# Patient Record
Sex: Male | Born: 1935 | Race: White | Hispanic: No | Smoking: Former smoker
Health system: Southern US, Community
[De-identification: ages and names within clinical notes are randomized; demographics above are authoritative.]

## PROBLEM LIST (undated history)

## (undated) DIAGNOSIS — R0600 Dyspnea, unspecified: Secondary | ICD-10-CM

## (undated) DIAGNOSIS — M549 Dorsalgia, unspecified: Secondary | ICD-10-CM

## (undated) DIAGNOSIS — Z87438 Personal history of other diseases of male genital organs: Secondary | ICD-10-CM

## (undated) DIAGNOSIS — E039 Hypothyroidism, unspecified: Secondary | ICD-10-CM

## (undated) DIAGNOSIS — I4510 Unspecified right bundle-branch block: Secondary | ICD-10-CM

## (undated) DIAGNOSIS — I82409 Acute embolism and thrombosis of unspecified deep veins of unspecified lower extremity: Secondary | ICD-10-CM

## (undated) DIAGNOSIS — D649 Anemia, unspecified: Secondary | ICD-10-CM

## (undated) DIAGNOSIS — M1990 Unspecified osteoarthritis, unspecified site: Secondary | ICD-10-CM

## (undated) DIAGNOSIS — I200 Unstable angina: Secondary | ICD-10-CM

## (undated) DIAGNOSIS — G8929 Other chronic pain: Secondary | ICD-10-CM

## (undated) DIAGNOSIS — E079 Disorder of thyroid, unspecified: Secondary | ICD-10-CM

## (undated) DIAGNOSIS — K219 Gastro-esophageal reflux disease without esophagitis: Secondary | ICD-10-CM

## (undated) DIAGNOSIS — M961 Postlaminectomy syndrome, not elsewhere classified: Secondary | ICD-10-CM

## (undated) DIAGNOSIS — I219 Acute myocardial infarction, unspecified: Secondary | ICD-10-CM

## (undated) DIAGNOSIS — I2699 Other pulmonary embolism without acute cor pulmonale: Secondary | ICD-10-CM

## (undated) DIAGNOSIS — D696 Thrombocytopenia, unspecified: Secondary | ICD-10-CM

## (undated) DIAGNOSIS — I2510 Atherosclerotic heart disease of native coronary artery without angina pectoris: Secondary | ICD-10-CM

## (undated) DIAGNOSIS — E785 Hyperlipidemia, unspecified: Secondary | ICD-10-CM

## (undated) DIAGNOSIS — H269 Unspecified cataract: Secondary | ICD-10-CM

## (undated) DIAGNOSIS — I499 Cardiac arrhythmia, unspecified: Secondary | ICD-10-CM

## (undated) DIAGNOSIS — F419 Anxiety disorder, unspecified: Secondary | ICD-10-CM

## (undated) DIAGNOSIS — I701 Atherosclerosis of renal artery: Secondary | ICD-10-CM

## (undated) DIAGNOSIS — I10 Essential (primary) hypertension: Secondary | ICD-10-CM

## (undated) HISTORY — DX: Atherosclerotic heart disease of native coronary artery without angina pectoris: I25.10

## (undated) HISTORY — PX: TOTAL HIP ARTHROPLASTY: SHX124

## (undated) HISTORY — DX: Cardiac arrhythmia, unspecified: I49.9

## (undated) HISTORY — DX: Personal history of other diseases of male genital organs: Z87.438

## (undated) HISTORY — DX: Unstable angina: I20.0

## (undated) HISTORY — DX: Hyperlipidemia, unspecified: E78.5

## (undated) HISTORY — DX: Other pulmonary embolism without acute cor pulmonale: I26.99

## (undated) HISTORY — DX: Unspecified right bundle-branch block: I45.10

## (undated) HISTORY — DX: Unspecified osteoarthritis, unspecified site: M19.90

## (undated) HISTORY — DX: Acute embolism and thrombosis of unspecified deep veins of unspecified lower extremity: I82.409

## (undated) HISTORY — PX: CATARACT EXTRACTION: SUR2

## (undated) HISTORY — DX: Thrombocytopenia, unspecified: D69.6

## (undated) HISTORY — DX: Atherosclerosis of renal artery: I70.1

## (undated) HISTORY — DX: Anemia, unspecified: D64.9

## (undated) HISTORY — PX: FOOT SURGERY: SHX648

## (undated) HISTORY — DX: Disorder of thyroid, unspecified: E07.9

## (undated) HISTORY — DX: Essential (primary) hypertension: I10

## (undated) HISTORY — PX: MANDIBLE SURGERY: SHX707

---

## 2001-07-07 ENCOUNTER — Ambulatory Visit (HOSPITAL_BASED_OUTPATIENT_CLINIC_OR_DEPARTMENT_OTHER): Admission: RE | Admit: 2001-07-07 | Discharge: 2001-07-08 | Payer: Self-pay | Admitting: Orthopedic Surgery

## 2002-02-15 ENCOUNTER — Inpatient Hospital Stay (HOSPITAL_COMMUNITY): Admission: RE | Admit: 2002-02-15 | Discharge: 2002-02-19 | Payer: Self-pay | Admitting: Orthopedic Surgery

## 2002-02-15 ENCOUNTER — Encounter: Payer: Self-pay | Admitting: Orthopedic Surgery

## 2002-02-18 ENCOUNTER — Encounter: Payer: Self-pay | Admitting: Orthopedic Surgery

## 2002-09-29 ENCOUNTER — Ambulatory Visit (HOSPITAL_COMMUNITY): Admission: RE | Admit: 2002-09-29 | Discharge: 2002-09-29 | Payer: Self-pay | Admitting: Orthopedic Surgery

## 2002-09-29 ENCOUNTER — Encounter: Payer: Self-pay | Admitting: Orthopedic Surgery

## 2003-02-28 ENCOUNTER — Ambulatory Visit (HOSPITAL_COMMUNITY): Admission: RE | Admit: 2003-02-28 | Discharge: 2003-02-28 | Payer: Self-pay | Admitting: Orthopedic Surgery

## 2003-02-28 ENCOUNTER — Ambulatory Visit (HOSPITAL_BASED_OUTPATIENT_CLINIC_OR_DEPARTMENT_OTHER): Admission: RE | Admit: 2003-02-28 | Discharge: 2003-02-28 | Payer: Self-pay | Admitting: Orthopedic Surgery

## 2003-11-06 ENCOUNTER — Encounter: Admission: RE | Admit: 2003-11-06 | Discharge: 2003-11-06 | Payer: Self-pay | Admitting: Orthopedic Surgery

## 2003-11-07 ENCOUNTER — Ambulatory Visit (HOSPITAL_BASED_OUTPATIENT_CLINIC_OR_DEPARTMENT_OTHER): Admission: RE | Admit: 2003-11-07 | Discharge: 2003-11-07 | Payer: Self-pay | Admitting: Orthopedic Surgery

## 2004-04-13 HISTORY — PX: REPLACEMENT TOTAL KNEE: SUR1224

## 2004-09-23 ENCOUNTER — Ambulatory Visit: Admission: RE | Admit: 2004-09-23 | Discharge: 2004-09-23 | Payer: Self-pay | Admitting: Orthopedic Surgery

## 2005-03-16 ENCOUNTER — Encounter: Admission: RE | Admit: 2005-03-16 | Discharge: 2005-03-16 | Payer: Self-pay | Admitting: Orthopedic Surgery

## 2006-01-05 ENCOUNTER — Encounter: Admission: RE | Admit: 2006-01-05 | Discharge: 2006-01-05 | Payer: Self-pay | Admitting: Sports Medicine

## 2006-01-25 ENCOUNTER — Encounter: Admission: RE | Admit: 2006-01-25 | Discharge: 2006-01-25 | Payer: Self-pay | Admitting: Orthopedic Surgery

## 2006-01-26 ENCOUNTER — Ambulatory Visit (HOSPITAL_BASED_OUTPATIENT_CLINIC_OR_DEPARTMENT_OTHER): Admission: RE | Admit: 2006-01-26 | Discharge: 2006-01-26 | Payer: Self-pay | Admitting: Orthopedic Surgery

## 2006-03-09 ENCOUNTER — Ambulatory Visit (HOSPITAL_BASED_OUTPATIENT_CLINIC_OR_DEPARTMENT_OTHER): Admission: RE | Admit: 2006-03-09 | Discharge: 2006-03-10 | Payer: Self-pay | Admitting: Orthopedic Surgery

## 2007-04-14 DIAGNOSIS — I2699 Other pulmonary embolism without acute cor pulmonale: Secondary | ICD-10-CM

## 2007-04-14 HISTORY — DX: Other pulmonary embolism without acute cor pulmonale: I26.99

## 2007-11-23 ENCOUNTER — Ambulatory Visit (HOSPITAL_COMMUNITY): Admission: RE | Admit: 2007-11-23 | Discharge: 2007-11-23 | Payer: Self-pay | Admitting: Orthopedic Surgery

## 2007-12-01 ENCOUNTER — Encounter: Payer: Self-pay | Admitting: Cardiovascular Disease

## 2007-12-01 ENCOUNTER — Ambulatory Visit: Payer: Self-pay

## 2007-12-01 ENCOUNTER — Ambulatory Visit: Payer: Self-pay | Admitting: Cardiovascular Disease

## 2007-12-18 ENCOUNTER — Inpatient Hospital Stay (HOSPITAL_COMMUNITY): Admission: EM | Admit: 2007-12-18 | Discharge: 2007-12-22 | Payer: Self-pay | Admitting: Emergency Medicine

## 2007-12-20 ENCOUNTER — Ambulatory Visit: Payer: Self-pay | Admitting: Surgery

## 2007-12-20 ENCOUNTER — Encounter (INDEPENDENT_AMBULATORY_CARE_PROVIDER_SITE_OTHER): Payer: Self-pay | Admitting: Internal Medicine

## 2008-04-13 HISTORY — PX: RENAL ARTERY STENT: SHX2321

## 2008-05-01 ENCOUNTER — Inpatient Hospital Stay (HOSPITAL_COMMUNITY): Admission: RE | Admit: 2008-05-01 | Discharge: 2008-05-04 | Payer: Self-pay | Admitting: Orthopedic Surgery

## 2009-11-11 DIAGNOSIS — I2510 Atherosclerotic heart disease of native coronary artery without angina pectoris: Secondary | ICD-10-CM

## 2009-11-11 HISTORY — DX: Atherosclerotic heart disease of native coronary artery without angina pectoris: I25.10

## 2009-11-17 ENCOUNTER — Encounter (INDEPENDENT_AMBULATORY_CARE_PROVIDER_SITE_OTHER): Payer: Self-pay | Admitting: Internal Medicine

## 2009-11-17 ENCOUNTER — Ambulatory Visit: Payer: Self-pay | Admitting: Internal Medicine

## 2009-11-17 ENCOUNTER — Inpatient Hospital Stay (HOSPITAL_COMMUNITY): Admission: EM | Admit: 2009-11-17 | Discharge: 2009-11-26 | Payer: Self-pay | Admitting: Emergency Medicine

## 2009-11-18 ENCOUNTER — Ambulatory Visit: Payer: Self-pay | Admitting: Cardiothoracic Surgery

## 2009-11-18 ENCOUNTER — Encounter: Payer: Self-pay | Admitting: Internal Medicine

## 2009-11-18 ENCOUNTER — Encounter: Payer: Self-pay | Admitting: Cardiovascular Disease

## 2009-11-21 HISTORY — PX: CORONARY ARTERY BYPASS GRAFT: SHX141

## 2009-11-26 ENCOUNTER — Encounter: Payer: Self-pay | Admitting: Cardiovascular Disease

## 2009-11-29 ENCOUNTER — Encounter: Payer: Self-pay | Admitting: Cardiovascular Disease

## 2009-12-12 DIAGNOSIS — I4510 Unspecified right bundle-branch block: Secondary | ICD-10-CM | POA: Insufficient documentation

## 2009-12-12 DIAGNOSIS — I200 Unstable angina: Secondary | ICD-10-CM | POA: Insufficient documentation

## 2009-12-12 DIAGNOSIS — M129 Arthropathy, unspecified: Secondary | ICD-10-CM | POA: Insufficient documentation

## 2009-12-12 DIAGNOSIS — N400 Enlarged prostate without lower urinary tract symptoms: Secondary | ICD-10-CM | POA: Insufficient documentation

## 2009-12-12 DIAGNOSIS — E039 Hypothyroidism, unspecified: Secondary | ICD-10-CM | POA: Insufficient documentation

## 2009-12-12 DIAGNOSIS — I4891 Unspecified atrial fibrillation: Secondary | ICD-10-CM | POA: Insufficient documentation

## 2009-12-12 DIAGNOSIS — M1990 Unspecified osteoarthritis, unspecified site: Secondary | ICD-10-CM | POA: Insufficient documentation

## 2009-12-12 DIAGNOSIS — D696 Thrombocytopenia, unspecified: Secondary | ICD-10-CM | POA: Insufficient documentation

## 2009-12-12 DIAGNOSIS — D649 Anemia, unspecified: Secondary | ICD-10-CM | POA: Insufficient documentation

## 2009-12-12 DIAGNOSIS — I10 Essential (primary) hypertension: Secondary | ICD-10-CM | POA: Insufficient documentation

## 2009-12-12 DIAGNOSIS — I2699 Other pulmonary embolism without acute cor pulmonale: Secondary | ICD-10-CM | POA: Insufficient documentation

## 2009-12-12 DIAGNOSIS — I2510 Atherosclerotic heart disease of native coronary artery without angina pectoris: Secondary | ICD-10-CM | POA: Insufficient documentation

## 2009-12-12 DIAGNOSIS — I480 Paroxysmal atrial fibrillation: Secondary | ICD-10-CM | POA: Insufficient documentation

## 2009-12-17 ENCOUNTER — Ambulatory Visit: Payer: Self-pay | Admitting: Cardiovascular Disease

## 2009-12-17 DIAGNOSIS — E782 Mixed hyperlipidemia: Secondary | ICD-10-CM | POA: Insufficient documentation

## 2009-12-18 ENCOUNTER — Encounter: Admission: RE | Admit: 2009-12-18 | Discharge: 2009-12-18 | Payer: Self-pay | Admitting: Cardiothoracic Surgery

## 2009-12-18 ENCOUNTER — Ambulatory Visit: Payer: Self-pay | Admitting: Cardiothoracic Surgery

## 2010-02-11 ENCOUNTER — Ambulatory Visit: Payer: Self-pay | Admitting: Cardiovascular Disease

## 2010-02-11 DIAGNOSIS — R0989 Other specified symptoms and signs involving the circulatory and respiratory systems: Secondary | ICD-10-CM | POA: Insufficient documentation

## 2010-05-15 NOTE — Consult Note (Signed)
Summary: [facility]  [facility]   Imported By: Marylou Mccoy 11/29/2009 10:00:10  _____________________________________________________________________  External Attachment:    Type:   Image     Comment:   External Document

## 2010-05-15 NOTE — Letter (Signed)
[facility]   Imported By: Roderic Ovens 12/13/2009 15:50:22  _____________________________________________________________________  External Attachment:    Type:   Image     Comment:   External Document

## 2010-05-15 NOTE — Assessment & Plan Note (Signed)
Summary: F2M/WPA  Medications Added METOPROLOL SUCCINATE 100 MG XR24H-TAB (METOPROLOL SUCCINATE) Take one tablet by mouth daily BENAZEPRIL HCL 10 MG TABS (BENAZEPRIL HCL) ONE TABLET by mouth once daily      Allergies Added: NKDA  History of Present Illness: Leonard Berg is seen post hospital D/C.  I last saw him in 2009.  He was admitted by CM and cathed by DB with 3VD emergence IABP and CABG.    Emergency coronary artery bypass grafting x3 (left internal mammary artery to LAD, saphenous vein graft to first diagonal, saphenous  vein graft to second diagonal).  2. Endoscopic harvest of right leg greater saphenous vein.  Severe multivessel coronary artery disease with  critical left anterior descending coronary artery stenosis, 70% left  main stenosis, and unstable angina in the cath lab requiring  preoperative intra-aortic balloon pump.   Reviewed cath  He is nauseated likley from his amiodarone.  Since he is in NSR we can stop this.   He needs to start cardiac rehab  Had PAF post op but has maint NSR  BP elevated but not taking correct meds.  Reviewed and corrected med list.  Benazapril to be called into [place] pharmacy and short acting BB to be stopped.   Will need carotid duplex before next visit for known bruits  Current Problems (verified): 1)  Mixed Hyperlipidemia  (ICD-272.2) 2)  Paroxysmal Atrial Fibrillation  (ICD-427.31) 3)  Hypertension  (ICD-401.9) 4)  Cad  (ICD-414.00) 5)  Right Bundle Branch Block  (ICD-426.4) 6)  Unstable Angina  (ICD-411.1) 7)  Arthritis  (ICD-716.90) 8)  Anemia  (ICD-285.9) 9)  Thrombocytopenia  (ICD-287.5) 10)  Osteoarthritis  (ICD-715.90) 11)  Benign Prostatic Hypertrophy, Hx of  (ICD-V13.8) 12)  Pulmonary Embolism  (ICD-415.19) 13)  Hypothyroidism  (ICD-244.9)  Current Medications (verified): 1)  Crestor 10 Mg Tabs (Rosuvastatin Calcium) .... Take One Tablet By Mouth Daily. 2)  Levothyroxine Sodium 150 Mcg Tabs (Levothyroxine Sodium) .Marland Kitchen.. 1  Tab By Mouth Once Daily 3)  Metoprolol Succinate 100 Mg Xr24h-Tab (Metoprolol Succinate) .... Take One Tablet By Mouth Daily 4)  Aspirin Ec 325 Mg Tbec (Aspirin) .... Take One Tablet By Mouth Daily 5)  Promethazine Hcl 25 Mg Tabs (Promethazine Hcl) .... As Needed 6)  Benazepril Hcl 10 Mg Tabs (Benazepril Hcl) .... One Tablet By Mouth Once Daily  Allergies (verified): No Known Drug Allergies  Past History:  Past Medical History: Last updated: 01/04/2010 PAROXYSMAL ATRIAL FIBRILLATION  HYPERTENSION  CAD/CABG:  8/11  LM emergent with IABP RIGHT BUNDLE BRANCH BLOCK  UNSTABLE ANGINA ARTHRITIS ANEMIA THROMBOCYTOPENIA  OSTEOARTHRITIS  BENIGN PROSTATIC HYPERTROPHY, HX OF PULMONARY EMBOLISM  HYPOTHYROIDISM   Past Surgical History: Last updated: 01/04/2010 Emergency coronary artery bypass grafting x3 (left internal mammary artery to LAD, saphenous vein graft to first diagonal, saphenous  vein graft to second diagonal).  2. Endoscopic harvest of right leg greater saphenous vein.  Severe multivessel coronary artery disease with  critical left anterior descending coronary artery stenosis, 70% left  main stenosis, and unstable angina in the cath lab requiring  preoperative intra-aortic balloon pump.  Leonard Berg, M.D.  PV/MEDQ  D:  11/18/2009  T:  11/19/2009  Job:  [id number]  cc:   Bevelyn Buckles. Bensimhon, MD   Right total knee replacement 2006.   Jaw surgery.    Foot surgery  Family History: Last updated: 04-Jan-2010  Mother died at 20 of old age.  Father died of lung   cancer at 64.  His elder brother  died at 13 of an MI.  His younger   sister died of cancer and his two other siblings are alive and well.   Social History: Last updated: 12/12/2009  He lives in [place] with his wife.  He has two   grown children.  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  He denies tobacco, alcohol, or drug use.   He goes to the [facility] three times a week.    Review of Systems       Denies fever, malais, weight loss, blurry vision, decreased visual acuity, cough, sputum, SOB, hemoptysis, pleuritic pain, palpitaitons, heartburn, abdominal pain, melena, lower extremity edema, claudication, or rash.   Vital Signs:  Patient profile:   77 year old male Weight:      226 pounds Pulse rate:   70 / minute Pulse rhythm:   regular BP sitting:   150 / 80  (left arm) Cuff size:   large  Vitals Entered By: Leonard Goody, RN (February 11, 2010 10:29 AM)  Physical Exam  General:  Affect appropriate Healthy:  appears stated age HEENT: normal Neck supple with no adenopathy JVP normal bilateral  bruits no thyromegaly Lungs clear with no wheezing and good diaphragmatic motion Heart:  S1/S2 no murmur,rub, gallop or click PMI normal Abdomen: benighn, BS positve, no tenderness, no AAA no bruit.  No HSM or HJR Distal pulses intact with no bruits No edema Neuro non-focal Skin warm and dry    Impression & Recommendations:  Problem # 1:  MIXED HYPERLIPIDEMIA (ICD-272.2) Lab work next visit continue statin His updated medication list for this problem includes:    Crestor 10 Mg Tabs (Rosuvastatin calcium) .Marland Kitchen... Take one tablet by mouth daily.  Problem # 2:  PAROXYSMAL ATRIAL FIBRILLATION (ICD-427.31) Maint NSR  Amiodarone gone and nausea improved His updated medication list for this problem includes:    Metoprolol Succinate 100 Mg Xr24h-tab (Metoprolol succinate) .Marland Kitchen... Take one tablet by mouth daily    Aspirin Ec 325 Mg Tbec (Aspirin) .Marland Kitchen... Take one tablet by mouth daily  Problem # 3:  HYPERTENSION (ICD-401.9) Not ideal but not taking ACE have called in to pharmacy and adjusted BB The following medications were removed from the medication list:    Benazepril Hcl 10 Mg Tabs (Benazepril hcl) .Marland Kitchen... 1 tab by mouth once daily His updated medication list for this problem includes:    Metoprolol Succinate 100 Mg Xr24h-tab (Metoprolol succinate) .Marland Kitchen...  Take one tablet by mouth daily    Aspirin Ec 325 Mg Tbec (Aspirin) .Marland Kitchen... Take one tablet by mouth daily    Benazepril Hcl 10 Mg Tabs (Benazepril hcl) ..... One tablet by mouth once daily  Problem # 4:  CAD (ICD-414.00) Stabel S/O emergency CABG  Continue ASA and BB The following medications were removed from the medication list:    Benazepril Hcl 10 Mg Tabs (Benazepril hcl) .Marland Kitchen... 1 tab by mouth once daily His updated medication list for this problem includes:    Metoprolol Succinate 100 Mg Xr24h-tab (Metoprolol succinate) .Marland Kitchen... Take one tablet by mouth daily    Aspirin Ec 325 Mg Tbec (Aspirin) .Marland Kitchen... Take one tablet by mouth daily    Benazepril Hcl 10 Mg Tabs (Benazepril hcl) ..... One tablet by mouth once daily  Problem # 5:  CAROTID BRUIT (ICD-785.9) F/U duplex 6 months.  Not sure he had preop studies as his CABG was emegent.  Will check  Patient Instructions: 1)  Your  physician recommends that you schedule a follow-up appointment in: 6 MONTHS 2)  Your physician has recommended you make the following change in your medication: STOP METOPROLOL TART 3)  START METOPROLOL SUCC 100MG  ONCE DAILY 4)  START BENAZEPRIL 10MG  ONCE DAILY 5)  Your physician has requested that you have a carotid duplex. This test is an ultrasound of the carotid arteries in your neck. It looks at blood flow through these arteries that supply the brain with blood. Allow one hour for this exam. There are no restrictions or special instructions.SCHEDULE IN 6 MONTHS Prescriptions: BENAZEPRIL HCL 10 MG TABS (BENAZEPRIL HCL) ONE TABLET by mouth once daily  #30 x 12   Entered by:   Leonard Goody, RN   Authorized by:   Colon Branch, MD, [facility]   Signed by:   Leonard Goody, RN on 02/11/2010   Method used:   Electronically to        [facility]* (retail)       [address]       [place], [place]  [address]       Ph: [phone]       Fax: [phone]   RxID:   [id number] METOPROLOL SUCCINATE 100 MG  XR24H-TAB (METOPROLOL SUCCINATE) Take one tablet by mouth daily  #30 x 12   Entered by:   Leonard Goody, RN   Authorized by:   Colon Branch, MD, [facility]   Signed by:   Leonard Goody, RN on 02/11/2010   Method used:   Electronically to        [facility]* (retail)       [address]       [place], [place]  [address]       Ph: [phone]       Fax: [phone]   RxID:   [id number]

## 2010-05-15 NOTE — Miscellaneous (Signed)
Summary: Physician Order/Treatment Plan   Physician Order/Treatment Plan   Imported By: Roderic Ovens 12/02/2009 15:50:22  _____________________________________________________________________  External Attachment:    Type:   Image     Comment:   External Document

## 2010-05-15 NOTE — Letter (Signed)
[facility]   Imported By: Marylou Mccoy 01/14/2010 11:27:26  _____________________________________________________________________  External Attachment:    Type:   Image     Comment:   External Document

## 2010-05-15 NOTE — Assessment & Plan Note (Signed)
Summary: EPH/POST CABG  Medications Added CRESTOR 10 MG TABS (ROSUVASTATIN CALCIUM) Take one tablet by mouth daily. LEVOTHYROXINE SODIUM 150 MCG TABS (LEVOTHYROXINE SODIUM) 1 tab by mouth once daily METOPROLOL TARTRATE 50 MG TABS (METOPROLOL TARTRATE) Take one tablet by mouth twice a day BENAZEPRIL HCL 10 MG TABS (BENAZEPRIL HCL) 1 tab by mouth once daily ASPIRIN EC 325 MG TBEC (ASPIRIN) Take one tablet by mouth daily TRAMADOL HCL 50 MG TABS (TRAMADOL HCL) as needed PROMETHAZINE HCL 25 MG TABS (PROMETHAZINE HCL) as needed      Allergies Added: NKDA  CC:  nausea.  History of Present Illness: Leonard Berg is seen post hospital D/C.  I last saw him in 2009.  He was admitted by CM and cathed by DB with 3VD emergence IABP and CABG.    Emergency coronary artery bypass grafting x3 (left internal mammary artery to LAD, saphenous vein graft to first diagonal, saphenous  vein graft to second diagonal).  2. Endoscopic harvest of right leg greater saphenous vein.  Severe multivessel coronary artery disease with  critical left anterior descending coronary artery stenosis, 70% left  main stenosis, and unstable angina in the cath lab requiring  preoperative intra-aortic balloon pump.    He is nauseated likley from his amiodarone.  Since he is in NSR we can stop this.   He needs to start cardiac rehab  Current Problems (verified): 1)  Paroxysmal Atrial Fibrillation  (ICD-427.31) 2)  Hypertension  (ICD-401.9) 3)  Cad  (ICD-414.00) 4)  Right Bundle Branch Block  (ICD-426.4) 5)  Unstable Angina  (ICD-411.1) 6)  Arthritis  (ICD-716.90) 7)  Anemia  (ICD-285.9) 8)  Thrombocytopenia  (ICD-287.5) 9)  Osteoarthritis  (ICD-715.90) 10)  Benign Prostatic Hypertrophy, Hx of  (ICD-V13.8) 11)  Pulmonary Embolism  (ICD-415.19) 12)  Hypothyroidism  (ICD-244.9)  Current Medications (verified): 1)  Crestor 10 Mg Tabs (Rosuvastatin Calcium) .... Take One Tablet By Mouth Daily. 2)  Levothyroxine Sodium 150 Mcg Tabs  (Levothyroxine Sodium) .Marland Kitchen.. 1 Tab By Mouth Once Daily 3)  Metoprolol Tartrate 50 Mg Tabs (Metoprolol Tartrate) .... Take One Tablet By Mouth Twice A Day 4)  Benazepril Hcl 10 Mg Tabs (Benazepril Hcl) .Marland Kitchen.. 1 Tab By Mouth Once Daily 5)  Aspirin Ec 325 Mg Tbec (Aspirin) .... Take One Tablet By Mouth Daily 6)  Tramadol Hcl 50 Mg Tabs (Tramadol Hcl) .... As Needed 7)  Promethazine Hcl 25 Mg Tabs (Promethazine Hcl) .... As Needed  Allergies (verified): No Known Drug Allergies  Past History:  Past Medical History: Last updated: 2009-12-28 PAROXYSMAL ATRIAL FIBRILLATION  HYPERTENSION  CAD/CABG:  8/11  LM emergent with IABP RIGHT BUNDLE BRANCH BLOCK  UNSTABLE ANGINA ARTHRITIS ANEMIA THROMBOCYTOPENIA  OSTEOARTHRITIS  BENIGN PROSTATIC HYPERTROPHY, HX OF PULMONARY EMBOLISM  HYPOTHYROIDISM   Past Surgical History: Last updated: 2009/12/28 Emergency coronary artery bypass grafting x3 (left internal mammary artery to LAD, saphenous vein graft to first diagonal, saphenous  vein graft to second diagonal).  2. Endoscopic harvest of right leg greater saphenous vein.  Severe multivessel coronary artery disease with  critical left anterior descending coronary artery stenosis, 70% left  main stenosis, and unstable angina in the cath lab requiring  preoperative intra-aortic balloon pump.  Leonard Berg, M.D.  PV/MEDQ  D:  11/18/2009  T:  11/19/2009  Job:  [id number]  cc:   Bevelyn Buckles. Bensimhon, MD   Right total knee replacement 2006.   Jaw surgery.    Foot surgery  Family History: Last updated: 2009/12/28  Mother died at  47 of old age.  Father died of lung   cancer at 21.  His elder brother died at 13 of an MI.  His younger   sister died of cancer and his two other siblings are alive and well.   Social History: Last updated: 12/12/2009  He lives in [place] with his wife.  He has two   grown children.  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  He  denies tobacco, alcohol, or drug use.   He goes to the [facility] three times a week.   Review of Systems       Denies fever, malais, weight loss, blurry vision, decreased visual acuity, cough, sputum, SOB, hemoptysis, pleuritic pain, palpitaitons, heartburn, abdominal pain, melena, lower extremity edema, claudication, or rash. Positive nausea  Vital Signs:  Patient profile:   77 year old male Height:      74 inches Weight:      226 pounds BMI:     29.12 Pulse rate:   65 / minute Resp:     14 per minute BP sitting:   137 / 80  (left arm)  Vitals Entered By: Kem Parkinson (December 17, 2009 11:45 AM)  Physical Exam  General:  Affect appropriate Healthy:  appears stated age HEENT: normal Neck supple with no adenopathy JVP normal no bruits no thyromegaly Lungs clear with no wheezing and good diaphragmatic motion Heart:  S1/S2 no murmur,rub, gallop or click PMI normal Abdomen: benighn, BS positve, no tenderness, no AAA no bruit.  No HSM or HJR Distal pulses intact with no bruits No edema Neuro non-focal Skin warm and dry S/P right TKR Sternum well healed   Impression & Recommendations:  Problem # 1:  PAROXYSMAL ATRIAL FIBRILLATION (ICD-427.31) Maint NSR  Stop amiodarone and see if nausea improves His updated medication list for this problem includes:    Metoprolol Tartrate 50 Mg Tabs (Metoprolol tartrate) .Marland Kitchen... Take one tablet by mouth twice a day    Aspirin Ec 325 Mg Tbec (Aspirin) .Marland Kitchen... Take one tablet by mouth daily  Problem # 2:  HYPERTENSION (ICD-401.9) Well controlled continue low sodium diet His updated medication list for this problem includes:    Metoprolol Tartrate 50 Mg Tabs (Metoprolol tartrate) .Marland Kitchen... Take one tablet by mouth twice a day    Benazepril Hcl 10 Mg Tabs (Benazepril hcl) .Marland Kitchen... 1 tab by mouth once daily    Aspirin Ec 325 Mg Tbec (Aspirin) .Marland Kitchen... Take one tablet by mouth daily  Problem # 3:  CAD (ICD-414.00) S/P emergent CABG for severe 3VD   Continue ASA and BB.  Cardiac rehab to start.   His updated medication list for this problem includes:    Metoprolol Tartrate 50 Mg Tabs (Metoprolol tartrate) .Marland Kitchen... Take one tablet by mouth twice a day    Benazepril Hcl 10 Mg Tabs (Benazepril hcl) .Marland Kitchen... 1 tab by mouth once daily    Aspirin Ec 325 Mg Tbec (Aspirin) .Marland Kitchen... Take one tablet by mouth daily  Problem # 4:  MIXED HYPERLIPIDEMIA (ICD-272.2) Continue statin. F/U labs in 3 months or sooner if nausea dioes not improve off amiodarone His updated medication list for this problem includes:    Crestor 10 Mg Tabs (Rosuvastatin calcium) .Marland Kitchen... Take one tablet by mouth daily.  Patient Instructions: 1)  Your physician recommends that you schedule a follow-up appointment in: 8 weeks. 2)  Your physician has recommended you make the following change in your medication: STOP  AMIODARONE   EKG Report  Procedure date:  12/17/2009  Findings:      NSR RBBB NO acute changes

## 2010-06-26 LAB — BASIC METABOLIC PANEL
BUN: 13 mg/dL (ref 6–23)
BUN: 21 mg/dL (ref 6–23)
CO2: 24 mEq/L (ref 19–32)
CO2: 25 mEq/L (ref 19–32)
Calcium: 8.4 mg/dL (ref 8.4–10.5)
Calcium: 8.8 mg/dL (ref 8.4–10.5)
Chloride: 101 mEq/L (ref 96–112)
Chloride: 101 mEq/L (ref 96–112)
Creatinine, Ser: 0.66 mg/dL (ref 0.4–1.5)
Creatinine, Ser: 1.46 mg/dL (ref 0.4–1.5)
GFR calc Af Amer: 57 mL/min — ABNORMAL LOW (ref >60)
GFR calc Af Amer: >60 mL/min (ref >60)
GFR calc non Af Amer: 47 mL/min — ABNORMAL LOW (ref >60)
GFR calc non Af Amer: >60 mL/min (ref >60)
Glucose, Bld: 115 mg/dL — ABNORMAL HIGH (ref 70–99)
Glucose, Bld: 160 mg/dL — ABNORMAL HIGH (ref 70–99)
Potassium: 4.7 mEq/L (ref 3.5–5.1)
Potassium: 4.9 mEq/L (ref 3.5–5.1)
Sodium: 136 mEq/L (ref 135–145)
Sodium: 136 mEq/L (ref 135–145)

## 2010-06-27 LAB — CBC
HCT: 30.1 % — ABNORMAL LOW (ref 39.0–52.0)
HCT: 30.8 % — ABNORMAL LOW (ref 39.0–52.0)
HCT: 31.2 % — ABNORMAL LOW (ref 39.0–52.0)
HCT: 31.9 % — ABNORMAL LOW (ref 39.0–52.0)
HCT: 32.4 % — ABNORMAL LOW (ref 39.0–52.0)
HCT: 32.9 % — ABNORMAL LOW (ref 39.0–52.0)
HCT: 34.9 % — ABNORMAL LOW (ref 39.0–52.0)
HCT: 34.9 % — ABNORMAL LOW (ref 39.0–52.0)
HCT: 42.6 % (ref 39.0–52.0)
HCT: 43.8 % (ref 39.0–52.0)
Hemoglobin: 10.1 g/dL — ABNORMAL LOW (ref 13.0–17.0)
Hemoglobin: 10.4 g/dL — ABNORMAL LOW (ref 13.0–17.0)
Hemoglobin: 10.4 g/dL — ABNORMAL LOW (ref 13.0–17.0)
Hemoglobin: 10.8 g/dL — ABNORMAL LOW (ref 13.0–17.0)
Hemoglobin: 11 g/dL — ABNORMAL LOW (ref 13.0–17.0)
Hemoglobin: 11.6 g/dL — ABNORMAL LOW (ref 13.0–17.0)
Hemoglobin: 11.6 g/dL — ABNORMAL LOW (ref 13.0–17.0)
Hemoglobin: 14.5 g/dL (ref 13.0–17.0)
Hemoglobin: 14.6 g/dL (ref 13.0–17.0)
Hemoglobin: 9.9 g/dL — ABNORMAL LOW (ref 13.0–17.0)
MCH: 31.2 pg (ref 26.0–34.0)
MCH: 31.6 pg (ref 26.0–34.0)
MCH: 31.6 pg (ref 26.0–34.0)
MCH: 31.8 pg (ref 26.0–34.0)
MCH: 31.8 pg (ref 26.0–34.0)
MCH: 31.8 pg (ref 26.0–34.0)
MCH: 31.8 pg (ref 26.0–34.0)
MCH: 32 pg (ref 26.0–34.0)
MCH: 32 pg (ref 26.0–34.0)
MCH: 32.7 pg (ref 26.0–34.0)
MCHC: 32.8 g/dL (ref 30.0–36.0)
MCHC: 32.9 g/dL (ref 30.0–36.0)
MCHC: 33.2 g/dL (ref 30.0–36.0)
MCHC: 33.2 g/dL (ref 30.0–36.0)
MCHC: 33.3 g/dL (ref 30.0–36.0)
MCHC: 33.3 g/dL (ref 30.0–36.0)
MCHC: 33.3 g/dL (ref 30.0–36.0)
MCHC: 33.4 g/dL (ref 30.0–36.0)
MCHC: 34 g/dL (ref 30.0–36.0)
MCV: 94.8 fL (ref 78.0–100.0)
MCV: 95.3 fL (ref 78.0–100.0)
MCV: 95.4 fL (ref 78.0–100.0)
MCV: 95.6 fL (ref 78.0–100.0)
MCV: 95.6 fL (ref 78.0–100.0)
MCV: 95.8 fL (ref 78.0–100.0)
MCV: 95.9 fL (ref 78.0–100.0)
MCV: 96.1 fL (ref 78.0–100.0)
MCV: 96.2 fL (ref 78.0–100.0)
MCV: 96.3 fL (ref 78.0–100.0)
Platelets: 102 10*3/uL — ABNORMAL LOW (ref 150–400)
Platelets: 115 10*3/uL — ABNORMAL LOW (ref 150–400)
Platelets: 125 10*3/uL — ABNORMAL LOW (ref 150–400)
Platelets: 127 10*3/uL — ABNORMAL LOW (ref 150–400)
Platelets: 151 10*3/uL (ref 150–400)
Platelets: 154 10*3/uL (ref 150–400)
Platelets: 185 10*3/uL (ref 150–400)
Platelets: 70 10*3/uL — ABNORMAL LOW (ref 150–400)
Platelets: 89 10*3/uL — ABNORMAL LOW (ref 150–400)
Platelets: 98 10*3/uL — ABNORMAL LOW (ref 150–400)
RBC: 3.13 MIL/uL — ABNORMAL LOW (ref 4.22–5.81)
RBC: 3.2 MIL/uL — ABNORMAL LOW (ref 4.22–5.81)
RBC: 3.27 MIL/uL — ABNORMAL LOW (ref 4.22–5.81)
RBC: 3.33 MIL/uL — ABNORMAL LOW (ref 4.22–5.81)
RBC: 3.4 MIL/uL — ABNORMAL LOW (ref 4.22–5.81)
RBC: 3.44 MIL/uL — ABNORMAL LOW (ref 4.22–5.81)
RBC: 3.63 MIL/uL — ABNORMAL LOW (ref 4.22–5.81)
RBC: 3.65 MIL/uL — ABNORMAL LOW (ref 4.22–5.81)
RBC: 4.59 MIL/uL (ref 4.22–5.81)
RBC: 4.85 MIL/uL (ref 4.22–5.81)
RDW: 12.7 % (ref 11.5–15.5)
RDW: 12.7 % (ref 11.5–15.5)
RDW: 12.8 % (ref 11.5–15.5)
RDW: 12.9 % (ref 11.5–15.5)
RDW: 12.9 % (ref 11.5–15.5)
RDW: 13 % (ref 11.5–15.5)
RDW: 13.2 % (ref 11.5–15.5)
RDW: 13.2 % (ref 11.5–15.5)
RDW: 13.4 % (ref 11.5–15.5)
WBC: 10.1 10*3/uL (ref 4.0–10.5)
WBC: 10.5 10*3/uL (ref 4.0–10.5)
WBC: 10.9 10*3/uL — ABNORMAL HIGH (ref 4.0–10.5)
WBC: 13.9 10*3/uL — ABNORMAL HIGH (ref 4.0–10.5)
WBC: 8.2 10*3/uL (ref 4.0–10.5)
WBC: 8.2 10*3/uL (ref 4.0–10.5)
WBC: 9.3 10*3/uL (ref 4.0–10.5)
WBC: 9.4 10*3/uL (ref 4.0–10.5)

## 2010-06-27 LAB — POCT I-STAT 3, ART BLOOD GAS (G3+)
Acid-base deficit: 1 mmol/L (ref 0.0–2.0)
Acid-base deficit: 2 mmol/L (ref 0.0–2.0)
Acid-base deficit: 2 mmol/L (ref 0.0–2.0)
Acid-base deficit: 3 mmol/L — ABNORMAL HIGH (ref 0.0–2.0)
Acid-base deficit: 4 mmol/L — ABNORMAL HIGH (ref 0.0–2.0)
Acid-base deficit: 4 mmol/L — ABNORMAL HIGH (ref 0.0–2.0)
Bicarbonate: 22.6 mEq/L (ref 20.0–24.0)
Bicarbonate: 23.3 mEq/L (ref 20.0–24.0)
Bicarbonate: 23.4 mEq/L (ref 20.0–24.0)
Bicarbonate: 23.9 mEq/L (ref 20.0–24.0)
Bicarbonate: 24 mEq/L (ref 20.0–24.0)
O2 Saturation: 100 %
O2 Saturation: 94 %
O2 Saturation: 95 %
Patient temperature: 35.7
Patient temperature: 37.3
Patient temperature: 37.8
Patient temperature: 97.8
TCO2: 24 mmol/L (ref 0–100)
TCO2: 25 mmol/L (ref 0–100)
TCO2: 27 mmol/L (ref 0–100)
pCO2 arterial: 38 mmHg (ref 35.0–45.0)
pCO2 arterial: 45.8 mmHg — ABNORMAL HIGH (ref 35.0–45.0)
pH, Arterial: 7.357 (ref 7.350–7.450)
pH, Arterial: 7.364 (ref 7.350–7.450)
pH, Arterial: 7.373 (ref 7.350–7.450)
pO2, Arterial: 106 mmHg — ABNORMAL HIGH (ref 80.0–100.0)
pO2, Arterial: 123 mmHg — ABNORMAL HIGH (ref 80.0–100.0)
pO2, Arterial: 264 mmHg — ABNORMAL HIGH (ref 80.0–100.0)
pO2, Arterial: 342 mmHg — ABNORMAL HIGH (ref 80.0–100.0)
pO2, Arterial: 69 mmHg — ABNORMAL LOW (ref 80.0–100.0)
pO2, Arterial: 69 mmHg — ABNORMAL LOW (ref 80.0–100.0)

## 2010-06-27 LAB — CARDIAC PANEL(CRET KIN+CKTOT+MB+TROPI)
CK, MB: 19.4 ng/mL (ref 0.3–4.0)
CK, MB: 2.5 ng/mL (ref 0.3–4.0)
CK, MB: 2.9 ng/mL (ref 0.3–4.0)
Relative Index: 5.6 — ABNORMAL HIGH (ref 0.0–2.5)
Relative Index: INVALID (ref 0.0–2.5)
Relative Index: INVALID (ref 0.0–2.5)
Relative Index: INVALID (ref 0.0–2.5)
Total CK: 347 U/L — ABNORMAL HIGH (ref 7–232)
Total CK: 56 U/L (ref 7–232)
Total CK: 73 U/L (ref 7–232)
Total CK: 80 U/L (ref 7–232)
Total CK: 89 U/L (ref 7–232)
Troponin I: 0.01 ng/mL (ref 0.00–0.06)
Troponin I: 1.93 ng/mL (ref 0.00–0.06)

## 2010-06-27 LAB — GLUCOSE, CAPILLARY
Glucose-Capillary: 103 mg/dL — ABNORMAL HIGH (ref 70–99)
Glucose-Capillary: 104 mg/dL — ABNORMAL HIGH (ref 70–99)
Glucose-Capillary: 105 mg/dL — ABNORMAL HIGH (ref 70–99)
Glucose-Capillary: 113 mg/dL — ABNORMAL HIGH (ref 70–99)
Glucose-Capillary: 115 mg/dL — ABNORMAL HIGH (ref 70–99)
Glucose-Capillary: 117 mg/dL — ABNORMAL HIGH (ref 70–99)
Glucose-Capillary: 123 mg/dL — ABNORMAL HIGH (ref 70–99)
Glucose-Capillary: 134 mg/dL — ABNORMAL HIGH (ref 70–99)
Glucose-Capillary: 137 mg/dL — ABNORMAL HIGH (ref 70–99)
Glucose-Capillary: 139 mg/dL — ABNORMAL HIGH (ref 70–99)
Glucose-Capillary: 149 mg/dL — ABNORMAL HIGH (ref 70–99)
Glucose-Capillary: 150 mg/dL — ABNORMAL HIGH (ref 70–99)
Glucose-Capillary: 171 mg/dL — ABNORMAL HIGH (ref 70–99)

## 2010-06-27 LAB — BASIC METABOLIC PANEL
BUN: 12 mg/dL (ref 6–23)
BUN: 14 mg/dL (ref 6–23)
BUN: 16 mg/dL (ref 6–23)
BUN: 17 mg/dL (ref 6–23)
BUN: 19 mg/dL (ref 6–23)
BUN: 20 mg/dL (ref 6–23)
BUN: 21 mg/dL (ref 6–23)
BUN: 21 mg/dL (ref 6–23)
CO2: 21 mEq/L (ref 19–32)
CO2: 21 mEq/L (ref 19–32)
CO2: 23 mEq/L (ref 19–32)
CO2: 24 mEq/L (ref 19–32)
CO2: 25 mEq/L (ref 19–32)
CO2: 26 mEq/L (ref 19–32)
CO2: 27 mEq/L (ref 19–32)
Calcium: 8.1 mg/dL — ABNORMAL LOW (ref 8.4–10.5)
Calcium: 8.1 mg/dL — ABNORMAL LOW (ref 8.4–10.5)
Calcium: 8.1 mg/dL — ABNORMAL LOW (ref 8.4–10.5)
Calcium: 8.2 mg/dL — ABNORMAL LOW (ref 8.4–10.5)
Calcium: 8.2 mg/dL — ABNORMAL LOW (ref 8.4–10.5)
Calcium: 8.4 mg/dL (ref 8.4–10.5)
Calcium: 8.5 mg/dL (ref 8.4–10.5)
Calcium: 8.9 mg/dL (ref 8.4–10.5)
Chloride: 102 mEq/L (ref 96–112)
Chloride: 103 mEq/L (ref 96–112)
Chloride: 104 mEq/L (ref 96–112)
Chloride: 105 mEq/L (ref 96–112)
Chloride: 106 mEq/L (ref 96–112)
Chloride: 109 mEq/L (ref 96–112)
Chloride: 110 mEq/L (ref 96–112)
Chloride: 110 mEq/L (ref 96–112)
Creatinine, Ser: 1.17 mg/dL (ref 0.4–1.5)
Creatinine, Ser: 1.22 mg/dL (ref 0.4–1.5)
Creatinine, Ser: 1.29 mg/dL (ref 0.4–1.5)
Creatinine, Ser: 1.35 mg/dL (ref 0.4–1.5)
Creatinine, Ser: 1.36 mg/dL (ref 0.4–1.5)
Creatinine, Ser: 1.38 mg/dL (ref 0.4–1.5)
Creatinine, Ser: 1.39 mg/dL (ref 0.4–1.5)
Creatinine, Ser: 1.4 mg/dL (ref 0.4–1.5)
GFR calc Af Amer: 59 mL/min — ABNORMAL LOW (ref >60)
GFR calc Af Amer: 60 mL/min — ABNORMAL LOW (ref >60)
GFR calc Af Amer: >60 mL/min (ref >60)
GFR calc Af Amer: >60 mL/min (ref >60)
GFR calc Af Amer: >60 mL/min (ref >60)
GFR calc Af Amer: >60 mL/min (ref >60)
GFR calc Af Amer: >60 mL/min (ref >60)
GFR calc Af Amer: >60 mL/min (ref >60)
GFR calc non Af Amer: 49 mL/min — ABNORMAL LOW (ref >60)
GFR calc non Af Amer: 50 mL/min — ABNORMAL LOW (ref >60)
GFR calc non Af Amer: 50 mL/min — ABNORMAL LOW (ref >60)
GFR calc non Af Amer: 51 mL/min — ABNORMAL LOW (ref >60)
GFR calc non Af Amer: 51 mL/min — ABNORMAL LOW (ref >60)
GFR calc non Af Amer: 54 mL/min — ABNORMAL LOW (ref >60)
GFR calc non Af Amer: 58 mL/min — ABNORMAL LOW (ref >60)
GFR calc non Af Amer: >60 mL/min (ref >60)
Glucose, Bld: 105 mg/dL — ABNORMAL HIGH (ref 70–99)
Glucose, Bld: 128 mg/dL — ABNORMAL HIGH (ref 70–99)
Glucose, Bld: 131 mg/dL — ABNORMAL HIGH (ref 70–99)
Glucose, Bld: 146 mg/dL — ABNORMAL HIGH (ref 70–99)
Glucose, Bld: 152 mg/dL — ABNORMAL HIGH (ref 70–99)
Glucose, Bld: 85 mg/dL (ref 70–99)
Glucose, Bld: 95 mg/dL (ref 70–99)
Potassium: 3.8 mEq/L (ref 3.5–5.1)
Potassium: 4.1 mEq/L (ref 3.5–5.1)
Potassium: 4.1 mEq/L (ref 3.5–5.1)
Potassium: 4.1 mEq/L (ref 3.5–5.1)
Potassium: 4.3 mEq/L (ref 3.5–5.1)
Potassium: 4.4 mEq/L (ref 3.5–5.1)
Potassium: 4.6 mEq/L (ref 3.5–5.1)
Sodium: 134 mEq/L — ABNORMAL LOW (ref 135–145)
Sodium: 137 mEq/L (ref 135–145)
Sodium: 137 mEq/L (ref 135–145)
Sodium: 137 mEq/L (ref 135–145)
Sodium: 138 mEq/L (ref 135–145)
Sodium: 138 mEq/L (ref 135–145)
Sodium: 139 mEq/L (ref 135–145)

## 2010-06-27 LAB — POCT CARDIAC MARKERS
CKMB, poc: 1.3 ng/mL (ref 1.0–8.0)
CKMB, poc: 1.6 ng/mL (ref 1.0–8.0)
Myoglobin, poc: 92.7 ng/mL (ref 12–200)
Troponin i, poc: <0.05 ng/mL (ref 0.00–0.09)

## 2010-06-27 LAB — MAGNESIUM
Magnesium: 2.5 mg/dL (ref 1.5–2.5)
Magnesium: 2.6 mg/dL — ABNORMAL HIGH (ref 1.5–2.5)

## 2010-06-27 LAB — POCT I-STAT 4, (NA,K, GLUC, HGB,HCT)
Glucose, Bld: 87 mg/dL (ref 70–99)
Glucose, Bld: 97 mg/dL (ref 70–99)
HCT: 34 % — ABNORMAL LOW (ref 39.0–52.0)
HCT: 43 % (ref 39.0–52.0)
Hemoglobin: 11.2 g/dL — ABNORMAL LOW (ref 13.0–17.0)
Hemoglobin: 13.3 g/dL (ref 13.0–17.0)
Hemoglobin: 14.6 g/dL (ref 13.0–17.0)
Potassium: 3.9 mEq/L (ref 3.5–5.1)
Potassium: 4.4 mEq/L (ref 3.5–5.1)
Potassium: 5.1 mEq/L (ref 3.5–5.1)
Sodium: 138 mEq/L (ref 135–145)
Sodium: 140 mEq/L (ref 135–145)

## 2010-06-27 LAB — COMPREHENSIVE METABOLIC PANEL
ALT: 15 U/L (ref 0–53)
AST: 24 U/L (ref 0–37)
Albumin: 3.2 g/dL — ABNORMAL LOW (ref 3.5–5.2)
Alkaline Phosphatase: 54 U/L (ref 39–117)
BUN: 12 mg/dL (ref 6–23)
CO2: 25 mEq/L (ref 19–32)
Calcium: 8.2 mg/dL — ABNORMAL LOW (ref 8.4–10.5)
Calcium: 8.4 mg/dL (ref 8.4–10.5)
Chloride: 104 mEq/L (ref 96–112)
Chloride: 105 mEq/L (ref 96–112)
Creatinine, Ser: 1.27 mg/dL (ref 0.4–1.5)
Creatinine, Ser: 1.33 mg/dL (ref 0.4–1.5)
GFR calc Af Amer: >60 mL/min (ref >60)
Glucose, Bld: 96 mg/dL (ref 70–99)
Sodium: 142 mEq/L (ref 135–145)

## 2010-06-27 LAB — PROTIME-INR
INR: 1.19 (ref 0.00–1.49)
INR: 1.29 (ref 0.00–1.49)
INR: 1.34 (ref 0.00–1.49)
INR: 1.38 (ref 0.00–1.49)
Prothrombin Time: 16.3 seconds — ABNORMAL HIGH (ref 11.6–15.2)
Prothrombin Time: 16.8 seconds — ABNORMAL HIGH (ref 11.6–15.2)
Prothrombin Time: 17.2 seconds — ABNORMAL HIGH (ref 11.6–15.2)

## 2010-06-27 LAB — LIPID PANEL
Cholesterol: 217 mg/dL — ABNORMAL HIGH (ref 0–200)
LDL Cholesterol: 121 mg/dL — ABNORMAL HIGH (ref 0–99)
VLDL: 64 mg/dL — ABNORMAL HIGH (ref 0–40)

## 2010-06-27 LAB — BRAIN NATRIURETIC PEPTIDE: Pro B Natriuretic peptide (BNP): 77 pg/mL (ref 0.0–100.0)

## 2010-06-27 LAB — POCT I-STAT, CHEM 8
BUN: 15 mg/dL (ref 6–23)
Calcium, Ion: 1.19 mmol/L (ref 1.12–1.32)
Chloride: 109 mEq/L (ref 96–112)
Glucose, Bld: 151 mg/dL — ABNORMAL HIGH (ref 70–99)
HCT: 32 % — ABNORMAL LOW (ref 39.0–52.0)
Potassium: 4.5 mEq/L (ref 3.5–5.1)

## 2010-06-27 LAB — DIFFERENTIAL
Basophils Absolute: 0.1 10*3/uL (ref 0.0–0.1)
Eosinophils Absolute: 0.3 10*3/uL (ref 0.0–0.7)
Eosinophils Relative: 3 % (ref 0–5)
Eosinophils Relative: 4 % (ref 0–5)
Lymphocytes Relative: 25 % (ref 12–46)
Lymphocytes Relative: 36 % (ref 12–46)
Lymphs Abs: 2 10*3/uL (ref 0.7–4.0)
Lymphs Abs: 2.3 10*3/uL (ref 0.7–4.0)
Monocytes Absolute: 0.8 10*3/uL (ref 0.1–1.0)
Neutro Abs: 4.8 10*3/uL (ref 1.7–7.7)
Neutrophils Relative %: 58 % (ref 43–77)

## 2010-06-27 LAB — T4, FREE: Free T4: 1.12 ng/dL (ref 0.80–1.80)

## 2010-06-27 LAB — PREPARE PLATELETS

## 2010-06-27 LAB — POCT I-STAT 3, VENOUS BLOOD GAS (G3P V)
O2 Saturation: 73 %
TCO2: 27 mmol/L (ref 0–100)
pCO2, Ven: 46.4 mmHg (ref 45.0–50.0)

## 2010-06-27 LAB — CREATININE, SERUM
Creatinine, Ser: 1.45 mg/dL (ref 0.4–1.5)
GFR calc Af Amer: 57 mL/min — ABNORMAL LOW (ref >60)
GFR calc non Af Amer: 47 mL/min — ABNORMAL LOW (ref >60)

## 2010-06-27 LAB — APTT
aPTT: 33 seconds (ref 24–37)
aPTT: 37 seconds (ref 24–37)

## 2010-06-27 LAB — D-DIMER, QUANTITATIVE: D-Dimer, Quant: 1.07 ug/mL-FEU — ABNORMAL HIGH (ref 0.00–0.48)

## 2010-06-27 LAB — TSH: TSH: 5.826 u[IU]/mL — ABNORMAL HIGH (ref 0.350–4.500)

## 2010-06-27 LAB — HEMOGLOBIN AND HEMATOCRIT, BLOOD
HCT: 33.7 % — ABNORMAL LOW (ref 39.0–52.0)
Hemoglobin: 11.3 g/dL — ABNORMAL LOW (ref 13.0–17.0)

## 2010-06-27 LAB — CROSSMATCH: ABO/RH(D): A POS

## 2010-07-08 ENCOUNTER — Other Ambulatory Visit: Payer: Self-pay

## 2010-07-08 DIAGNOSIS — E782 Mixed hyperlipidemia: Secondary | ICD-10-CM

## 2010-07-08 MED ORDER — ROSUVASTATIN CALCIUM 10 MG PO TABS: 10.0000 mg | ORAL_TABLET | Freq: Every day | ORAL | Status: DC

## 2010-07-15 ENCOUNTER — Encounter: Payer: Self-pay | Admitting: Cardiovascular Disease

## 2010-07-16 ENCOUNTER — Encounter: Payer: Self-pay | Admitting: Cardiovascular Disease

## 2010-07-16 ENCOUNTER — Encounter: Payer: Self-pay | Admitting: *Deleted

## 2010-07-16 ENCOUNTER — Telehealth: Payer: Self-pay | Admitting: *Deleted

## 2010-07-16 ENCOUNTER — Ambulatory Visit (INDEPENDENT_AMBULATORY_CARE_PROVIDER_SITE_OTHER): Payer: Self-pay | Admitting: Cardiovascular Disease

## 2010-07-16 VITALS — BP 146/73 | HR 79 | Resp 18 | Ht 74.0 in | Wt 235.8 lb

## 2010-07-16 DIAGNOSIS — I701 Atherosclerosis of renal artery: Secondary | ICD-10-CM

## 2010-07-16 DIAGNOSIS — I7090 Unspecified atherosclerosis: Secondary | ICD-10-CM

## 2010-07-16 LAB — CBC WITH DIFFERENTIAL/PLATELET
Basophils Absolute: 0.1 10*3/uL (ref 0.0–0.1)
Eosinophils Relative: 2.6 % (ref 0.0–5.0)
HCT: 42.8 % (ref 39.0–52.0)
Hemoglobin: 14.5 g/dL (ref 13.0–17.0)
Lymphs Abs: 2.2 10*3/uL (ref 0.7–4.0)
MCV: 95.9 fl (ref 78.0–100.0)
Monocytes Absolute: 1 10*3/uL (ref 0.1–1.0)
Neutro Abs: 4 10*3/uL (ref 1.4–7.7)
Platelets: 179 10*3/uL (ref 150.0–400.0)
RDW: 13.2 % (ref 11.5–14.6)

## 2010-07-16 LAB — BASIC METABOLIC PANEL
BUN: 19 mg/dL (ref 6–23)
Chloride: 108 mEq/L (ref 96–112)
Glucose, Bld: 83 mg/dL (ref 70–99)
Potassium: 5 mEq/L (ref 3.5–5.1)

## 2010-07-16 LAB — PROTIME-INR: Prothrombin Time: 10.6 s (ref 10.2–12.4)

## 2010-07-16 NOTE — Progress Notes (Signed)
HPI:  This is a 77 year old gentleman referred for initial evaluation of renal artery stenosis. The patient has a history of hypertension over the past 2-3 years. He presented with an acute coronary syndrome in August of 2011 and underwent cardiac catheterization. At that time he had an abdominal aortic angiogram performed demonstrating severe right renal artery stenosis. The patient had to undergo emergency coronary bypass surgery. He has now recovered and has been seen by Dr. Darrick Penna for renal artery stenosis. He has had some worsening of renal function with a most recent creatinine of 1.6 mg per deciliter. Previous creatinines have ranged between 0.6 and 1.4. He also had a renal duplex scan suggestive of stenosis in the proximal right renal artery with elevated velocities of 392 cm/s.  The patient complains of exercise intolerance and shortness of breath with activity. He also complains of generalized fatigue. He denies chest pain or pressure. He denies leg edema, orthopnea, PND, palpitations, lightheadedness, or syncope. He still works on his cattle farm.   Outpatient Encounter Prescriptions as of 07/16/2010  Medication Sig Dispense Refill  . aspirin 81 MG tablet Take 81 mg by mouth daily.        . benazepril (LOTENSIN) 10 MG tablet Take 10 mg by mouth daily.        Marland Kitchen levothyroxine (SYNTHROID, LEVOTHROID) 125 MCG tablet Take 125 mcg by mouth daily.        . metoprolol (TOPROL-XL) 100 MG 24 hr tablet Take 100 mg by mouth daily.        . rosuvastatin (CRESTOR) 10 MG tablet Take 1 tablet (10 mg total) by mouth at bedtime.  30 tablet  11  . Tamsulosin HCl (FLOMAX) 0.4 MG CAPS Take 0.4 mg by mouth daily.        Marland Kitchen DISCONTD: aspirin 325 MG tablet Take 325 mg by mouth daily.        Marland Kitchen DISCONTD: metoprolol (LOPRESSOR) 100 MG tablet Take 100 mg by mouth daily.          Review of patient's allergies indicates no known allergies.  Past Medical History  Diagnosis Date  . Renal artery stenosis   .  Hypertension   . Hyperlipidemia   . Pulmonary embolism 2009    hx of  . Coronary artery disease   . DJD (degenerative joint disease)     Past Surgical History  Procedure Date  . Coronary artery bypass graft 11-21-09  . Total knee arthroplasty     right  . Replacement total knee     History   Social History  . Marital Status: Married    Spouse Name: N/A    Number of Children: N/A  . Years of Education: N/A   Occupational History  . retired     Visual merchandiser   Social History Main Topics  . Smoking status: Former Smoker    Quit date: 04/13/1980  . Smokeless tobacco: Not on file   Comment: He has smoked about 27-pack-year hx   . Alcohol Use: Yes     He used to drink more heavily , but rarely drinks at the current time  . Drug Use: No  . Sexually Active: Not on file   Other Topics Concern  . Not on file   Social History Narrative  . No narrative on file    Family History  Problem Relation Age of Onset  . Lung cancer Father 41    deceased   . Stroke Mother 13    DECEASED   .  Heart attack Brother 59    deceased  . Liver cancer Sister 70    deceased     ROS: General: no fevers/chills/night sweats Eyes: he notes episodes of blurry vision ENT: no sore throat or hearing loss Resp: no cough, wheezing, or hemoptysis CV: no edema or palpitations GI: no abdominal pain, nausea, vomiting, diarrhea, or constipation GU: no dysuria, frequency, or hematuria Skin: no rash Neuro: no headache, numbness, tingling, or weakness of extremities Musculoskeletal: no joint pain or swelling Heme: no bleeding, DVT, or easy bruising Endo: no polydipsia or polyuria  BP 146/73  Pulse 79  Resp 18  Ht 6\' 2"  (1.88 m)  Wt 235 lb 12.8 oz (106.958 kg)  BMI 30.27 kg/m2  PHYSICAL EXAM: Pt is alert and oriented, elderly man, WD, WN, in no distress. HEENT: normal Neck: JVP normal. Carotid upstrokes normal with bilateral bruits. No thyromegaly. Lungs: equal expansion, clear bilaterally CV:  Apex is discrete and nondisplaced, RRR with soft systolic ejection murmur along the left sternal border Abd: soft, NT, +BS, no bruit, no hepatosplenomegaly Back: no CVA tenderness Ext: no C/C/E        Femoral pulses 2+= without bruits        DP/PT pulses intact and = Skin: warm and dry without rash Neuro: CNII-XII intact             Strength intact = bilaterally  ASSESSMENT AND PLAN:

## 2010-07-16 NOTE — Patient Instructions (Signed)
Your physician has requested that you have a renal angiogram. This exam is performed at the hospital. During this exam IV contrast is used to look at arterial blood flow. Please review the information sheet given for details. Procedure is scheduled for July 23, 2010. Take Plavix 300 mg (4 of the 75 mg tablets) day before procedure. Lab work was drawn today.

## 2010-07-16 NOTE — Assessment & Plan Note (Signed)
I reviewed the available data regarding the patient's renal artery stenosis. The patient's angiogram was reviewed and he does have proximal right renal artery stenosis. Visually this appears severe with the most significant lesion in the proximal body of the right main renal artery.  The patient's renal artery duplex dated 06-12-2010 shows a right kidney length of 10.3 cm and the left kidney length of 12.1 cm. The proximal right renal artery peak velocity was 392 cm/s. With decline in renal function, and confirmation of severe renal artery stenosis by his recent duplex scan, I agree that renal PTA and stenting as indicated. I reviewed the risks and indication of the procedure with the patient and he understands and agrees to proceed. He will be started on Plavix prior to the procedure.

## 2010-07-16 NOTE — Progress Notes (Signed)
Reviewed the patient's pre-angiography labs. His creatinine is 1.6. Recommend hold ACE inhibitor the day prior to and the day of his procedure.

## 2010-07-16 NOTE — Telephone Encounter (Signed)
Procedure time changed on April 11,2012 to 11:30. Pt called and notified of time change and that he should arrive at 9:30

## 2010-07-17 ENCOUNTER — Telehealth: Payer: Self-pay

## 2010-07-17 ENCOUNTER — Encounter: Payer: Self-pay | Admitting: Cardiovascular Disease

## 2010-07-17 NOTE — Telephone Encounter (Signed)
I spoke with the pt and made him aware to hold his Benazepril the day prior to procedure and morning of procedure. Pt verbalized understanding.

## 2010-07-17 NOTE — Telephone Encounter (Signed)
Leonard Bollman, MD 07/16/2010 2:54 PM Signed  Reviewed the patient's pre-angiography labs. His creatinine is 1.6. Recommend hold ACE inhibitor the day prior to and the day of his procedure.  Left message for pt to call back.

## 2010-07-23 ENCOUNTER — Ambulatory Visit (HOSPITAL_COMMUNITY)
Admission: RE | Admit: 2010-07-23 | Discharge: 2010-07-23 | Disposition: A | Discharge status: Home / Self Care | Payer: Medicare Other | Source: Ambulatory Visit | Attending: Cardiovascular Disease | Admitting: Cardiovascular Disease

## 2010-07-23 DIAGNOSIS — Z7982 Long term (current) use of aspirin: Secondary | ICD-10-CM | POA: Insufficient documentation

## 2010-07-23 DIAGNOSIS — E785 Hyperlipidemia, unspecified: Secondary | ICD-10-CM | POA: Insufficient documentation

## 2010-07-23 DIAGNOSIS — I129 Hypertensive chronic kidney disease with stage 1 through stage 4 chronic kidney disease, or unspecified chronic kidney disease: Secondary | ICD-10-CM | POA: Insufficient documentation

## 2010-07-23 DIAGNOSIS — M1990 Unspecified osteoarthritis, unspecified site: Secondary | ICD-10-CM | POA: Insufficient documentation

## 2010-07-23 DIAGNOSIS — I701 Atherosclerosis of renal artery: Secondary | ICD-10-CM | POA: Insufficient documentation

## 2010-07-23 DIAGNOSIS — Z86711 Personal history of pulmonary embolism: Secondary | ICD-10-CM | POA: Insufficient documentation

## 2010-07-23 DIAGNOSIS — I2510 Atherosclerotic heart disease of native coronary artery without angina pectoris: Secondary | ICD-10-CM | POA: Insufficient documentation

## 2010-07-23 DIAGNOSIS — Z951 Presence of aortocoronary bypass graft: Secondary | ICD-10-CM | POA: Insufficient documentation

## 2010-07-23 DIAGNOSIS — N189 Chronic kidney disease, unspecified: Secondary | ICD-10-CM | POA: Insufficient documentation

## 2010-07-23 DIAGNOSIS — Z96659 Presence of unspecified artificial knee joint: Secondary | ICD-10-CM | POA: Insufficient documentation

## 2010-07-23 DIAGNOSIS — Z79899 Other long term (current) drug therapy: Secondary | ICD-10-CM | POA: Insufficient documentation

## 2010-07-28 LAB — BASIC METABOLIC PANEL
CO2: 25 mEq/L (ref 19–32)
Calcium: 9.2 mg/dL (ref 8.4–10.5)
Calcium: 8.1 mg/dL — ABNORMAL LOW (ref 8.4–10.5)
Calcium: 8.4 mg/dL (ref 8.4–10.5)
Chloride: 105 mEq/L (ref 96–112)
Creatinine, Ser: 1.2 mg/dL (ref 0.4–1.5)
GFR calc Af Amer: >60 mL/min (ref >60)
GFR calc Af Amer: >60 mL/min (ref >60)
GFR calc non Af Amer: >60 mL/min (ref >60)
GFR calc non Af Amer: 59 mL/min — ABNORMAL LOW (ref >60)
Glucose, Bld: 118 mg/dL — ABNORMAL HIGH (ref 70–99)
Glucose, Bld: 122 mg/dL — ABNORMAL HIGH (ref 70–99)
Potassium: 4.2 mEq/L (ref 3.5–5.1)
Potassium: 4.3 mEq/L (ref 3.5–5.1)
Sodium: 140 mEq/L (ref 135–145)
Sodium: 136 mEq/L (ref 135–145)
Sodium: 137 mEq/L (ref 135–145)

## 2010-07-28 LAB — CBC
HCT: 45.5 % (ref 39.0–52.0)
HCT: 36.2 % — ABNORMAL LOW (ref 39.0–52.0)
Hemoglobin: 15.2 g/dL (ref 13.0–17.0)
Hemoglobin: 10.6 g/dL — ABNORMAL LOW (ref 13.0–17.0)
Hemoglobin: 12.2 g/dL — ABNORMAL LOW (ref 13.0–17.0)
Platelets: 163 10*3/uL (ref 150–400)
RBC: 4.78 MIL/uL (ref 4.22–5.81)
RBC: 3.24 MIL/uL — ABNORMAL LOW (ref 4.22–5.81)
RDW: 13.1 % (ref 11.5–15.5)
RDW: 13.1 % (ref 11.5–15.5)
WBC: 7.4 10*3/uL (ref 4.0–10.5)
WBC: 7.8 10*3/uL (ref 4.0–10.5)

## 2010-07-28 LAB — URINALYSIS, ROUTINE W REFLEX MICROSCOPIC
Glucose, UA: NEGATIVE mg/dL
Nitrite: NEGATIVE
Specific Gravity, Urine: 1.017 (ref 1.005–1.030)
pH: 5.5 (ref 5.0–8.0)

## 2010-07-28 LAB — PROTIME-INR
INR: 1.1 (ref 0.00–1.49)
INR: 1.3 (ref 0.00–1.49)
INR: 1.5 (ref 0.00–1.49)
Prothrombin Time: 16.1 seconds — ABNORMAL HIGH (ref 11.6–15.2)

## 2010-07-28 LAB — DIFFERENTIAL
Eosinophils Relative: 3 % (ref 0–5)
Lymphocytes Relative: 42 % (ref 12–46)
Lymphs Abs: 2.8 10*3/uL (ref 0.7–4.0)
Monocytes Absolute: 0.6 10*3/uL (ref 0.1–1.0)
Monocytes Relative: 9 % (ref 3–12)
Neutro Abs: 3.1 10*3/uL (ref 1.7–7.7)

## 2010-07-28 LAB — TYPE AND SCREEN: Antibody Screen: NEGATIVE

## 2010-07-28 LAB — APTT: aPTT: <20 seconds — ABNORMAL LOW (ref 24–37)

## 2010-07-28 LAB — ABO/RH: ABO/RH(D): A POS

## 2010-08-02 ENCOUNTER — Encounter: Payer: Self-pay | Admitting: Cardiovascular Disease

## 2010-08-05 ENCOUNTER — Ambulatory Visit (INDEPENDENT_AMBULATORY_CARE_PROVIDER_SITE_OTHER): Payer: Medicare Other | Admitting: Cardiovascular Disease

## 2010-08-05 ENCOUNTER — Encounter: Payer: Self-pay | Admitting: Cardiovascular Disease

## 2010-08-05 DIAGNOSIS — I701 Atherosclerosis of renal artery: Secondary | ICD-10-CM

## 2010-08-05 DIAGNOSIS — R0609 Other forms of dyspnea: Secondary | ICD-10-CM

## 2010-08-05 DIAGNOSIS — I10 Essential (primary) hypertension: Secondary | ICD-10-CM

## 2010-08-05 DIAGNOSIS — R0989 Other specified symptoms and signs involving the circulatory and respiratory systems: Secondary | ICD-10-CM

## 2010-08-05 DIAGNOSIS — I2510 Atherosclerotic heart disease of native coronary artery without angina pectoris: Secondary | ICD-10-CM

## 2010-08-05 NOTE — Assessment & Plan Note (Signed)
The patient doesn't feel well and has dyspnea with exertion. He does not have evidence of volume overload or congestive heart failure on exam. I do think his LV function should be reassessed in the setting of coronary artery disease. Also recommend checking a CBC and metabolic panel. Will check a TSH as he has persistent tachycardia.

## 2010-08-05 NOTE — Patient Instructions (Signed)
Your physician wants you to follow-up in: 6 months. You will receive a reminder letter in the mail two months in advance. If you don't receive a letter, please call our office to schedule the follow-up appointment.  Your physician has requested that you have a renal artery duplex in 6 months. During this test, an ultrasound is used to evaluate blood flow to the kidneys. Allow one hour for this exam. Do not eat after midnight the day before and avoid carbonated beverages. Take your medications as you usually do.  Your physician has requested that you have an echocardiogram. Echocardiography is a painless test that uses sound waves to create images of your heart. It provides your doctor with information about the size and shape of your heart and how well your heart's chambers and valves are working. This procedure takes approximately one hour. There are no restrictions for this procedure.  Your physician recommends that you havelab work today: cbc, bmp, liver, tsh (440.1;786.09;414.01).  Your physician recommends that you continue on your current medications as directed. Please refer to the Current Medication list given to you today.

## 2010-08-05 NOTE — Assessment & Plan Note (Signed)
Blood pressure is well controlled on current regimen. 

## 2010-08-05 NOTE — Progress Notes (Signed)
HPI:  This is a 77 year old gentleman presenting for followup evaluation.  The patient underwent emergency coronary bypass surgery last year. When he underwent cardiac catheterization he was found to have severe right renal artery stenosis. He has chronic kidney disease and refractory hypertension. He was referred for renal artery stenting and underwent an uncomplicated stenting procedure about 2 weeks ago. The patient was treated with a 6 mm stent with a good result. He presents today for followup evaluation.  He continues to complain of generalized fatigue and exertional dyspnea. His legs get tired with activity. He denies chest pain. He has not checked his blood pressure regularly. He has no other complaints today.  Outpatient Encounter Prescriptions as of 08/05/2010  Medication Sig Dispense Refill  . aspirin 81 MG tablet Take 81 mg by mouth daily.        . benazepril (LOTENSIN) 10 MG tablet Take 10 mg by mouth daily.        Marland Kitchen levothyroxine (SYNTHROID, LEVOTHROID) 125 MCG tablet Take 125 mcg by mouth daily.        . metoprolol (TOPROL-XL) 100 MG 24 hr tablet Take 100 mg by mouth daily.        . rosuvastatin (CRESTOR) 10 MG tablet Take 1 tablet (10 mg total) by mouth at bedtime.  30 tablet  11  . Tamsulosin HCl (FLOMAX) 0.4 MG CAPS Take 0.4 mg by mouth daily.        Marland Kitchen DISCONTD: telmisartan-hydrochlorothiazide (MICARDIS HCT) 80-25 MG per tablet Take 1 tablet by mouth daily.          No Known Allergies  Past Medical History  Diagnosis Date  . Renal artery stenosis   . Hypertension   . Hyperlipidemia   . Pulmonary embolism 2009    hx of  . DJD (degenerative joint disease)   . Arrhythmia     PAROXYSMAL ATRIAL FIBRILLATION  . Coronary artery disease 8/11    CABG...LM EMERGENT WITH IABP  . RBBB (right bundle branch block)   . Unstable angina   . Arthritis     OSTEOARTHRITIS  . Anemia   . Thrombocytopenia   . History of BPH   . Thyroid disease     HYPOTHYROIDISM    ROS: Negative  except as per HPI  BP 136/80  Pulse 101  Resp 18  Ht 6\' 2"  (1.88 m)  Wt 231 lb 12.8 oz (105.144 kg)  BMI 29.76 kg/m2  PHYSICAL EXAM: Pt is alert and oriented, NAD HEENT: normal Neck: JVP - normal, carotids 2+= without bruits Lungs: CTA bilaterally CV: RRR without murmur or gallop Abd: soft, NT, Positive BS, no hepatomegaly Ext: no C/C/E, distal pulses intact and equal Skin: warm/dry no rash  EKG:  Sinus tachycardia 101 beats per minute, right bundle branch block, age indeterminate inferior infarction.  ASSESSMENT AND PLAN:

## 2010-08-05 NOTE — Assessment & Plan Note (Signed)
The patient's blood pressure is controlled on his current medical program. He should have a followup renal duplex in about 6 months to evaluate for stent patency. I don't see Plavix on the patient's medication list and we will need to confirm whether he is taking this. He should be receiving it for 30 days following his stenting procedure.

## 2010-08-06 ENCOUNTER — Telehealth: Payer: Self-pay | Admitting: Cardiovascular Disease

## 2010-08-06 DIAGNOSIS — I701 Atherosclerosis of renal artery: Secondary | ICD-10-CM

## 2010-08-06 NOTE — Telephone Encounter (Signed)
megan the pharmaciest states pt needs a written or a nurse to call in plavix for the pt.

## 2010-08-07 ENCOUNTER — Telehealth: Payer: Self-pay | Admitting: Cardiovascular Disease

## 2010-08-07 MED ORDER — CLOPIDOGREL BISULFATE 75 MG PO TABS: 75.0000 mg | ORAL_TABLET | Freq: Every day | ORAL | Status: DC

## 2010-08-07 NOTE — Telephone Encounter (Signed)
Pharmacist states pt is waiting for plavix to be call in to [facility] pharmacy [phone].

## 2010-08-07 NOTE — Telephone Encounter (Signed)
I spoke with Dr Cooper and this pt did not get a prescription for Plavix after his renal PTA. Dr Cooper would like the pt to take Plavix 75mg daily for only 30 days. Prescription called to the pharmacist #30, no refills.    

## 2010-08-07 NOTE — Telephone Encounter (Signed)
I spoke with Dr Excell Seltzer and this pt did not get a prescription for Plavix after his renal PTA. Dr Excell Seltzer would like the pt to take Plavix 75mg  daily for only 30 days. Prescription called to the pharmacist #30, no refills.

## 2010-08-12 ENCOUNTER — Ambulatory Visit (HOSPITAL_COMMUNITY): Payer: Medicare Other | Attending: Cardiovascular Disease

## 2010-08-12 ENCOUNTER — Other Ambulatory Visit (INDEPENDENT_AMBULATORY_CARE_PROVIDER_SITE_OTHER): Payer: Medicare Other | Admitting: *Deleted

## 2010-08-12 DIAGNOSIS — R0989 Other specified symptoms and signs involving the circulatory and respiratory systems: Secondary | ICD-10-CM

## 2010-08-12 DIAGNOSIS — I2510 Atherosclerotic heart disease of native coronary artery without angina pectoris: Secondary | ICD-10-CM

## 2010-08-12 DIAGNOSIS — I70219 Atherosclerosis of native arteries of extremities with intermittent claudication, unspecified extremity: Secondary | ICD-10-CM

## 2010-08-12 DIAGNOSIS — R0609 Other forms of dyspnea: Secondary | ICD-10-CM

## 2010-08-12 LAB — CBC WITH DIFFERENTIAL/PLATELET
Basophils Relative: 0.5 % (ref 0.0–3.0)
Eosinophils Relative: 1.6 % (ref 0.0–5.0)
HCT: 41.1 % (ref 39.0–52.0)
Hemoglobin: 13.8 g/dL (ref 13.0–17.0)
Lymphocytes Relative: 30.6 % (ref 12.0–46.0)
Lymphs Abs: 2.3 10*3/uL (ref 0.7–4.0)
Monocytes Relative: 11.2 % (ref 3.0–12.0)
Neutro Abs: 4.2 10*3/uL (ref 1.4–7.7)
RBC: 4.23 Mil/uL (ref 4.22–5.81)
WBC: 7.6 10*3/uL (ref 4.5–10.5)

## 2010-08-12 LAB — LIPID PANEL
HDL: 40.9 mg/dL (ref >39.00)
Total CHOL/HDL Ratio: 4
VLDL: 50.4 mg/dL — ABNORMAL HIGH (ref 0.0–40.0)

## 2010-08-12 LAB — LDL CHOLESTEROL, DIRECT: Direct LDL: 100.8 mg/dL

## 2010-08-12 LAB — HEPATIC FUNCTION PANEL
Alkaline Phosphatase: 62 U/L (ref 39–117)
Bilirubin, Direct: 0 mg/dL (ref 0.0–0.3)
Total Protein: 6.7 g/dL (ref 6.0–8.3)

## 2010-08-14 NOTE — Procedures (Signed)
  NAME:  Leonard Berg, Leonard Berg NO.:  [id number]  MEDICAL RECORD NO.:  [id number]           PATIENT TYPE:  O  LOCATION:  [facility]                         FACILITY:  [facility]  PHYSICIAN:  Veverly Fells. Excell Seltzer, MD  DATE OF BIRTH:  [birth year]  DATE OF PROCEDURE:  07/23/2010 DATE OF DISCHARGE:  07/23/2010                   PERIPHERAL VASCULAR INVASIVE PROCEDURE   PROCEDURES: 1. Right renal angiography. 2. Right renal artery stenting.  PROCEDURAL INDICATION:  [name] is a 77 year old gentleman who has developed chronic kidney disease.  He also has malignant hypertension. He underwent emergency coronary bypass surgery in August 2011.  He had an abdominal aortogram performed prior to his bypass surgery at the time of cardiac catheterization.  This demonstrated severe 90% ostial right renal artery stenosis.  The patient was referred for renal angiography and planned stenting.  Risks and indications of the procedure were reviewed with the patient and informed consent was obtained.  Right groin was prepped, draped, and anesthetized with 1% lidocaine.  Using modified Seldinger technique, a 6- French sheath was placed in the right femoral artery.  The patient was given weight-based heparin.  An IM guide catheter was advanced and the right renal artery was selectively engaged.  There was a marked pressure gradient of greater than 100 mmHg.  Once a therapeutic ACT was achieved, a stabilizer wire was advanced into the renal artery.  Angiography was performed and this confirmed severe 90% ostial and proximal right renal artery stenosis.  The remaining portions of the vessel were widely patent.  The vessel was primarily stented with a 6 x 12 mm balloon expandable Herculink stent.  The balloon was delivered into the renal artery and then pulled back so that there was full coverage of the ostium.  Stent was very carefully positioned and was deployed at 14 atmospheres pressure.  There was an  excellent angiographic result with 0% residual stenosis.  The patient tolerated the procedure well.  A Perclose device was used for femoral artery hemostasis.  FINAL CONCLUSION:  Successful stenting of the right renal artery using a 6 x 12 mm Herculink stent.     Veverly Fells. Excell Seltzer, MD     MDC/MEDQ  D:  07/24/2010  T:  07/24/2010  Job:  [id number]  Electronically Signed by Tonny Bollman MD on 08/14/2010 10:36:48 AM

## 2010-08-15 ENCOUNTER — Encounter: Payer: Self-pay | Admitting: Cardiovascular Disease

## 2010-08-18 ENCOUNTER — Telehealth: Payer: Self-pay | Admitting: Cardiovascular Disease

## 2010-08-18 NOTE — Telephone Encounter (Signed)
Pt aware of echo and lab results.  

## 2010-08-18 NOTE — Telephone Encounter (Signed)
Pt calling re results °

## 2010-08-26 NOTE — H&P (Signed)
NAME:  Leonard Berg, Leonard Berg NO.:  [id number]   MEDICAL RECORD NO.:  [id number]          PATIENT TYPE:  INP   LOCATION:  1826                         FACILITY:  [facility]   PHYSICIAN:  Peggye Pitt, M.D. DATE OF BIRTH:  Sep 08, 1929   DATE OF ADMISSION:  12/18/2007  DATE OF DISCHARGE:                              HISTORY & PHYSICAL   PRIMARY CARE PHYSICIAN:  Dr. Heidi Dach in [place], that makes  him unassigned to [place].   CHIEF COMPLAINT:  Chest pain and shortness of breath.   HISTORY OF PRESENT ILLNESS:  Leonard Berg is a 77 year old white man  with history of hypertension and hypothyroidism who comes in with severe  chest pain and shortness of breath of 3 days duration.  Pain is  localized over his right chest and radiated all the way down to his  right upper quadrant.  He denies any fever, chills, or any other  symptoms.  He decided to come in today because pain was unbearable and  it was difficult for him to take a deep breath or move forward.   ALLERGIES:  He has no known drug allergies.   PAST MEDICAL HISTORY:  Significant for hypertension, hypothyroidism, and  multiple orthopedic surgeries including knees, ankles, and hip as well  as BPH.   HOME MEDICATIONS:  1. Flomax 0.4 mg p.o. daily.  2. Synthroid 125 mcg p.o. daily.  3. Toprol 50 mg p.o. daily.   SOCIAL HISTORY:  He is married and [place]  He is a Patent attorney.  He denies any alcohol, tobacco,  or illicit drug use.   FAMILY HISTORY:  Remarkable for a father who died at age 29 from cancer  of unknown origin and a brother who had an MI in his 26s.  His mother  otherwise died of apparently natural causes at age 91.   REVIEW OF SYSTEMS:  Negative except as per HPI.   PHYSICAL EXAM:  VITAL SIGNS: Blood pressure 158/86, heart rate 95,  respirations 22, O2 sats 94% on room air with a temperature of 98.6.  GENERAL: He was alert, awake, oriented x3 not in  acute distress at the  time of my examination.  HEENT: Normocephalic, atraumatic.  His pupils were equal, reactive to  light and accommodation with intact extraocular movements.  NECK: Supple.  No JVD, no lymphadenopathy, no bruits or goiter.  LUNGS: He did have some bibasilar wet crackles, right greater than left.  HEART: Regular rate and rhythm with no murmurs, rubs, or gallops  auscultated.  ABDOMEN: At the time of my examination, his abdomen was soft, nontender  to palpation with positive bowel sounds.  No masses or organomegaly, but  by the time I saw him he had received several doses of IV fentanyl.  Per  ED physician's report, he was exquisitely tender to his right upper  quadrant.  EXTREMITIES: He had no edema and positive pedal pulses.  NEUROLOGIC: Grossly intact and nonfocal.   LABORATORY DATA:  Labs upon admission, sodium 139, potassium 4.2,  chloride 103, bicarb 29, BUN 60, creatinine 1.3, glucose of  166.  His  white count was slightly elevated at 10.9 with an ANC of 7.3, a  hemoglobin of 16.0 and platelets of 236.  His INR was 1.0.  His FOBT was  negative.  His first set of point-of-care markers was negative with a  myoglobin of 73.  A CK-MB of 2.4 and a troponin-I less than 0.05.  His  chest x-ray showed decreased lung volumes and bibasilar atelectases.  He  had a CT angiogram of his chest that showed a right middle lobe and a  lingular PE with by a lateral lower lobe atelectasis/opacity in which  infarcts cannot be excluded.   ASSESSMENT AND PLAN:  His chest pain/shortness of breath which is  secondary to his bilateral PE.   PLAN:  1. PE: anticoagulation with Coumadin for at least 6 months.  For now,      we will bridge with full dose Lovenox.  Because this episode of PE      appears to be unprovoked, we will also check a hypercoagulable      panel.  2. His right upper quadrant pain which was present at the time of the      emergency department  physician's  examination with a positive      Murphy's sign, but again this was negative to me.  I do not believe      at this point in time he has cholecystitis.  I think that this      right upper quadrant pain is secondary to the pulmonary infarcts      that were possibly seen on the CT angio.  I will hold the      antibiotics that were started in the emergency department, but I do      agree with the right upper quadrant that ultrasound that was      ordered just to verify this and I will also order some liver      function tests.  3. For his hypothyroidism.  We will check a TSH and continue his home      dose of Synthroid.  4. For his hypertension.  We will continue his Toprol.  5. For his benign prostatic hypertrophy, we will continue his Flomax.      Peggye Pitt, M.D.  Electronically Signed     EH/MEDQ  D:  12/18/2007  T:  12/19/2007  Job:  [id number]

## 2010-08-26 NOTE — Assessment & Plan Note (Signed)
OFFICE VISIT   NISSIM, FLEISCHER  DOB:  [birth year]                                        December 18, 2009  CHART #:  [id number]   CURRENT PROBLEMS:  1. Status post emergency coronary artery bypass grafting x3 on November 18, 2009, for severe left main and multivessel disease with unstable      angina and moderate left ventricular dysfunction.  2. Hypertension.  3. Osteoarthritis.   PRESENT ILLNESS:  The patient is a very nice 77 year old gentleman who  returns for his first office visit after undergoing emergency  multivessel bypass grafting in August 2011.  He presented with unstable  angina and positive cardiac enzymes, and emergency cath demonstrated a  critical 99% stenosis of the mid LAD and high-grade disease of the first  and second diagonal branches of the LAD.  The EF was 40% and the left  main had a 70% stenosis.  The circumflex was small and nondominant.  He  underwent left IMA graft to his LAD and vein grafts to the first and  second diagonals.  Postoperatively, he did well.  He did have  postoperative atrial fibrillation, which converted to sinus rhythm with  amiodarone.  He was discharged home on the fifth postoperative day on  amiodarone b.i.d., Crestor 10 mg daily, 1 week of Lasix and potassium,  Lopressor 50 mg b.i.d. as well as levothyroxine 150 mcg, Flomax, and  Ultram for pain.  Since returning home, he has had no recurrent angina  and is slowly regaining his strength.  He has had some nausea and some  weight loss of 10-12 pounds.  The surgical incisions have healed well.  He has had no recurrent symptoms of angina.   PHYSICAL EXAMINATION:  Vital Signs:  Blood pressure 128/70, pulse 70,  respirations 18, and saturation on room air 94%.  General:  He is alert  and comfortable.  Lungs:  Breath sounds are clear and equal.  The  sternum is stable and well healed.  Cardiac:  Rhythm is regular without  gallop or murmur.   Extremities:  Leg incision is well healed.  There is  no peripheral edema.   DIAGNOSTIC TESTS:  A PA and lateral chest x-ray reveals clear lung  fields, no pleural effusion.  The sternal wires are intact and aligned.  The cardiac silhouette is stable.  A rhythm strip shows him to be in  sinus rhythm.   PLAN:  The patient was told he could resume driving and light  activities, but to avoid lifting more than 20 pounds until 3 months  after surgery.  He was encouraged to enroll in an outpatient cardiac  rehab program and he will prefer to phase II at [facility].  He will  continue his current medications, but discontinue the amiodarone due to  the nausea side effects and the fact that he has maintained sinus rhythm  now for over a month.  He will return here as needed.   Kerin Perna, M.D.  Electronically Signed   PV/MEDQ  D:  12/18/2009  T:  12/19/2009  Job:  [id number]   cc:   Noralyn Pick. Eden Emms, MD, [facility]  Loma Sender

## 2010-08-26 NOTE — Discharge Summary (Signed)
NAME:  Leonard Berg, Leonard Berg NO.:  [id number]   MEDICAL RECORD NO.:  [id number]          PATIENT TYPE:  INP   LOCATION:  5121                         FACILITY:  [facility]   PHYSICIAN:  Theodosia Paling, MD    DATE OF BIRTH:  1929/12/03   DATE OF ADMISSION:  12/18/2007  DATE OF DISCHARGE:  12/22/2007                               DISCHARGE SUMMARY   ADDENDUM   In addition to the discharge summary dictated by Dr. Lonia Blood, since  yesterday the patient was having some degree of hemoptysis and epistaxis  which has completely resolved.  He is hemodynamically stable.  His H and  H is fine and he is able to tolerate his pleuritic pain.  Therefore the  patient is going home today, and he will follow up with his primary care  physician, Dr. Loma Sender tomorrow and on Monday to follow on INR.      Theodosia Paling, MD  Electronically Signed     NP/MEDQ  D:  12/22/2007  T:  12/23/2007  Job:  [id number]   cc:   Loma Sender

## 2010-08-26 NOTE — H&P (Signed)
NAME:  Leonard Berg, Leonard Berg NO.:  [id number]   MEDICAL RECORD NO.:  [id number]          PATIENT TYPE:  INP   LOCATION:                               FACILITY:  [facility]   PHYSICIAN:  Madlyn Frankel. Charlann Boxer, M.D.  DATE OF BIRTH:  10/25/1929   DATE OF ADMISSION:  05/01/2008  DATE OF DISCHARGE:                              HISTORY & PHYSICAL   PROCEDURE:  Right total hip replacement.   ATTENDING PHYSICIAN:  Dr. Durene Romans.   CHIEF COMPLAINTS:  Right hip pain.   HISTORY OF PRESENT ILLNESS:  A 77 year old male with a history of right  hip pain secondary to osteoarthritis.  It has been refractory to  conservative treatment.  He does have a recent history of DVT.  He has  been on chronic Coumadin.  It has been at least 6 months.  He has been  presurgically assessed prior to surgery by Dr Kristeen Miss and his  primary care physician Dr. Loma Sender.   PAST MEDICAL HISTORY:  1. Osteoarthritis.  2. DVT on Coumadin.  3. Hypothyroidism.  4. Benign prostatic hypertrophy.  5. Hypertension.   PAST SURGICAL HISTORY:  1. Right total knee replacement 2006.  2. Jaw surgery.  3. Foot surgery.   FAMILY HISTORY:  Heart disease, cancer.   SOCIAL HISTORY:  He is married and has a primary caregiver at home.  He  is a nonsmoker.   ALLERGIES:  NO KNOWN DRUG ALLERGIES.   MEDICATIONS:  1. Flomax 0.4 mg p.o. daily.  2. Synthroid 125 mcg p.o. daily.  3. Toprol 50 mg p.o. daily.  4. Coumadin 7.5 mg daily, maintain INR 2 to 3.   REVIEW OF SYSTEMS:  HPI.   PHYSICAL EXAMINATION:  VITAL SIGNS:  Pulse 72, respirations 16, blood  pressure 140/90.  GENERAL:  Awake, alert and oriented.  HEENT:  Normocephalic.  NECK:  Supple.  CHEST/LUNGS:  Clear to auscultation bilaterally.  BREASTS:  Deferred.  HEART:  Regular rate and rhythm.  S1-S2 distinct.  ABDOMEN:  Bowel sounds present.  PELVIS:  Stable.  GENITOURINARY:  Deferred.  EXTREMITIES:  Right hip has increased pain, decreased range  of motion,  utilizes a cane.  SKIN:  No cellulitis.  NEUROLOGIC:  Intact distal sensibilities.   LABORATORY DATA:  Labs, EKG, chest x-ray all pending presurgical  testing.   IMPRESSION:  Right hip osteoarthritis.   PLAN OF ACTION:  Right total hip replacement on May 01, 2008, at  [facility] with Dr. Lajoyce Corners.  Risks, complications were  discussed.   Postoperative medication previously had been given.  He will be  utilizing Coumadin for DVT prophylaxis.     ______________________________  Yetta Glassman [address], [place]      Madlyn Frankel. Charlann Boxer, M.D.  Electronically Signed    BLM/MEDQ  D:  04/18/2008  T:  04/18/2008  Job:  [id number]   cc:   Loma Sender  Fax: [phone]   Vesta Mixer, M.D.  Fax: [phone]

## 2010-08-26 NOTE — Assessment & Plan Note (Signed)
[facility]                            CARDIOLOGY OFFICE NOTE   NAME:Leonard Berg, Leonard Berg                   MRN:          [id number]  DATE:12/01/2007                            DOB:          [birth year]    [name] was seen today at the request of Dr. Lequita Halt for preop  clearance.  He is 77 years old.  He has had multiple previous orthopedic  surgeries, particularly on his right knee and right ankle.  He is to  have a right hip replacement.  His EKG was abnormal with a right bundle-  branch block.   He has not had any previous cardiac problems.  Particularly, with all of  his surgeries, his activity is limited by pain in his right leg.  His  coronary risk factors include positive family history.  He apparently  has had high cholesterol, which is untreated.  He is a nonsmoker.   The patient has not had significant chest pain.  He does not have  palpitations, PND, or orthopnea.  There is no shortness of breath.  He  has not had any bleeding diathesis or other anesthetic complications.   I do not have any old EKGs on him.  The EKG from Dr. Deri Fuelling office  simply shows a right bundle-branch block.  I suspect that there are some  old EKGs we can look up in the new system at [facility] and also with Dr.  Loma Sender.   In either case, I told the patient that given his preop status, he  should probably have a 2-D echocardiogram to make sure there is no  structural abnormality of his heart.  He otherwise will not need further  workup.  I think his risk for occult coronary artery disease is low.  He  is asymptomatic and nonsmoker, and I do not think he needs a stress  test.   Right bundle-branch block maybe benign and it is actually normal in upto  20% of people.   His review of systems is otherwise negative.  Past medical history is  primarily orthopedic.  He has had multiple surgeries in his right knee,  left ankle, as well as right ankle.   The  most recent surgery was in 2008.   The patient is a retired Patent attorney.  He is married.  He has 2 older  children.  He does not drink or smoke.   His activity is severely limited by his arthritis and right knee pain.   Family history is remarkable for mother dying at age 33 of old age.  Father had cancer and died at age 26.  He did have a brother who had a  heart attack in his 76s.   His medications include Flomax 0.4 mg a day and Synthroid 125 mcg a day.  He says Dr. Vear Clock check his thyroid status a month ago and it was  fine.   PHYSICAL EXAMINATION:  GENERAL:  Remarkable for middle-to-elderly aged  white male in no distress.  VITAL SIGNS:  Blood pressure is 160/88, pulse is 110, weight is 233, he  is afebrile, respiratory  rate 14.  HEENT:  Unremarkable.  Carotids are normal without bruit.  No  lymphadenopathy, thyromegaly, or JVP elevation.  LUNGS:  Clear diaphragmatic motion.  No wheezing.  HEART:  S1 and S2 with normal heart sounds.  PMI normal.  ABDOMEN:  Benign.  Bowel sounds positive.  No AAA, no tenderness, no  bruit, no hepatosplenomegaly, or hepatojugular reflux.  No tenderness.  EXTREMITIES:  Distal pulses are intact.  No edema.  NEURO:  Nonfocal.  SKIN:  Warm and dry.  No muscular weakness.   He is status post multiple surgeries to the right knee and ankle.   EKG shows right bundle-branch block.   IMPRESSION:  1. Right bundle-branch block, maybe benign, no evidence of coronary      artery disease.  2. Relative tachycardia.  The patient indicates that he is nervous.      Apparently, his TSH is normal.  Recently checked by primary care      MD.  I would like to start him on Toprol 50 mg a day in regards to      his elevated blood pressure and pulse since he is in the preop      phase.  3. Rule out cardiomyopathy.  The patient has an abnormal EKG and a      resting tachycardia.  We will check a 2-D echocardiogram to assess      right ventricular and left  ventricular function.  4. Untreated hypercholesterolemia.  Follow up with Dr. Vear Clock.      Consider initiation of statin drugs.  5. Orthopedic issues.  The patient will hopefully get his      echocardiogram tomorrow.  He will follow up with Dr. Lequita Halt to get      his hip surgery rescheduled.  We would like to follow him when he      is admitted to [facility].  The patient will be cleared      for surgery as long as his echo does not show any significant      decrease in the left ventricular function.     Noralyn Pick. Eden Emms, MD, [facility]  Electronically Signed    PCN/MedQ  DD: 12/01/2007  DT: 12/02/2007  Job #: [id number]

## 2010-08-26 NOTE — Op Note (Signed)
NAMEJAMERION, Leonard Berg NO.:  [id number]   MEDICAL RECORD NO.:  [id number]          PATIENT TYPE:  INP   LOCATION:  0004                         FACILITY:  [facility]   PHYSICIAN:  Madlyn Frankel. Charlann Boxer, M.D.  DATE OF BIRTH:  01/13/1930   DATE OF PROCEDURE:  DATE OF DISCHARGE:                               OPERATIVE REPORT   PREOPERATIVE DIAGNOSIS:  Right hip osteoarthritis.   POSTOPERATIVE DIAGNOSIS:  Right hip osteoarthritis.   PROCEDURE:  Right total hip replacement.   COMPONENTS USED:  DePuy hip system size 56 pinnacle cup, two cancellous  bone screws central hole eliminator, a 36 +4 neutral marathon liner, an  eight high Trilock stem with a 36 +5 ball.   SURGEON:  Madlyn Frankel. Charlann Boxer, M.D.   ASSISTANT:  Dwyane Luo, PA-C   ANESTHESIA:  General.   SPECIMENS:  None.   FINDINGS:  None.   COMPLICATIONS:  None.   DRAINS:  One medium Hemovac.   BLOOD LOSS:  450 mL.   INDICATIONS FOR PROCEDURE:  Leonard Berg is a 77 year old gentleman  known to me for advanced right hip osteoarthritis.  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  Thus, he has been delayed for some time.  His hip pain has  persisted knee and he at this point wished to proceed with hip  replacement surgery despite the risks that are known to him including  DVT, recurrent pulmonary embolus and the risks thereof including the  risks of infection, DVT, component failure, dislocation, need for  revision surgery for any reason, risks, benefits were discussed.  Consent was obtained for a right total hip replacement.  Consent for  benefit of pain relief.   PROCEDURE IN DETAIL:  The patient was brought to operative theater.  Once adequate anesthesia was established, preoperative antibiotics, 2  grams of Ancef, after I evaluated the patient in the supine position.  Radiographically he had more of valgus neck on his left hip from  presumably a previous  fracture and left shortening on the right lower  extremity.  The lower extremity leg length discrepancy was identified.  I then evaluated this in the lateral position when  he was positioned in  the left lateral decubitus position.   We pre scrubbed the lateral thigh.  We prepped and draped in a sterile  fashion.  A time-out was performed identifying the correct patient  extremity.   At this point, the lateral based incision was made for posterior  approach to the hip.  The iliotibial band and gluteal fascia were  dissected down to and then opened up posteriorly.  The short external  rotators were noted to be adhered to the posterior capsule, but they  were separated so I could preserve the posterior capsule for protection  of the sciatic nerve but also to repair it later.   The hip was dislocated.  His hip range of motion was noted be severely  limited due to his hip arthritis.  The hip was dislocated without  complication.  A neck osteotomy was made into the trochanteric fossa  based off anatomic landmarks.   At this point, I attended to the femur first.  I opened up the canal  with a drill, used a hand reamer once and then irrigated to prevent fat  emboli.  I then began broaching setting anteversion at approximately 20-  25 degrees.  I broached up to a size seven, which initially sat at the  level of my neck cut and I milled this to a little bit of the posterior  bone.   At this point, I packed the femur into the acetabulum.  The acetabular  exposure was obtained and included debridement of significant redundant  capsule anteriorly that was identified.  I then removed the labrum.  I  used an osteotome to debride osteophytes posterior/inferior.  I then  began reaming with a 46 reamer, reamed up to 52 reamer before switching  to odd numbers.  I was able to ream down to an excellent bony bed to  about a 55 reamer, at which point I impacted a 56 pinnacle cup.  The cup  was  positioned beneath the anterior wall as identified by my reamers  initially and positioned about 35-40 degrees of abduction.  A portion of  the cup was exposed superior/lateral.  Given this I went ahead and  placed two cancellus screws, went ahead and placed a hole eliminator,  checked with my hip guide to make sure that I was happy with the  position in both flexion and abduction and then impacted a 36 +4 neutral  marathon liner.  This was impacted and sat circumferentially down as  visualized.   At this point, we went to the trials.  I went initially with a seven  broach and a standard neck.  There was quite a bit of shuck indicating  leg lengths were off of this point as well as some impingement  anteriorly.  I went ahead and broached up to a size eight broach.  This  sat proud on my neck cut as I was hoping for leg lengths and then  initially trailed with a high offset neck and a 36 1/5 ball.  With this  the hip was very stable from extension and external rotation and in the  sleep position.  There still a bit of impingement anteriorly limiting  the internal rotation to about 60 degrees.  However, given his  limitations beforehand this was not a concern to me.   At this point, I removed the trial femoral components.  I debrided some  of the anterior femoral head and neck bone.  I then debrided some  further capsular tissues that appeared to be redundant anteriorly and  thickened.  The final eight trial lock high offset stem was then  impacted.  It sat at the level of the broach hat.  Based on my trial  reduction and retrial I then ended up using a 36 +5 ball.  This helped  the leg length as well as abduction to decrease some of the potential  impingement anteriorly.  The final 36 +5 ball was impacted onto a dry  trunnion and the hip reduced.  We irrigated the hip at this point and as  we had done throughout the case.  There was no active hemostasis just  some bony oozing as well as  oozing from his musculature.   I reapproximated the posterior capsule with the superior leaflet using  #1 Vicryl.  I then placed a medium Hemovac drain deep.  The remaining  wound was then closed over top of this with #1 Vicryl on the iliotibial  band and the  gluteal fascia, 2-0 Vicryl in the subcu layer and 4-0 running Monocryl.  Hip was cleaned, dried and dressed sterilely with Steri-Strips and  Mepilex dressings.  He was then brought to the recovery room extubated  in stable condition tolerating the procedure well.      Madlyn Frankel Charlann Boxer, M.D.  Electronically Signed     MDO/MEDQ  D:  05/01/2008  T:  05/01/2008  Job:  [id number]

## 2010-08-26 NOTE — Discharge Summary (Signed)
NAME:  Leonard Berg, Leonard Berg NO.:  [id number]   MEDICAL RECORD NO.:  [id number]          PATIENT TYPE:  INP   LOCATION:  5121                         FACILITY:  [facility]   PHYSICIAN:  Leonard Berg, M.D.       DATE OF BIRTH:  [birth year]   DATE OF ADMISSION:  12/18/2007  DATE OF DISCHARGE:                               DISCHARGE SUMMARY   PRIMARY CARE PHYSICIAN:  Dr. Loma Sender.   DISCHARGE DIAGNOSES:  1. Pulmonary emboli.  2. Lung infarct.  3. Severe pleuritic chest pain.  4. Hypertension.  5. Hyperlipidemia.  6. Benign prostatic hypertrophy.  7. Hypothyroidism.  8. Severe degenerative joint disease of hips and knees.   DISCHARGE MEDICATIONS:  1. Lovenox 100 mg subcu every 12 hours.  2. Coumadin 7.5 mg at 6 p.m.  3. Simvastatin 20 mg daily.  4. Flomax 0.4 mg daily.  5. Synthroid 125 mcg daily.  6. Toprol 50 mg daily.   Any additional discharge medications to be dictated in the addendum to  this discharge summary.   CONDITION ON DISCHARGE:  Will be dictated at the actual discharge of  this patient.  The patient will follow up with Dr. Loma Sender in  [place] as soon as possible after discharge for PT/INR checks.   PROCEDURES DURING THIS ADMISSION:  1. On December 18, 2007, the patient underwent CT scan of the chest      with intravenous contrast which was positive for pulmonary emboli      as well as lung infarct.  2. On December 18, 2007, abdominal ultrasound, which was essentially      within normal limits.   CONSULTATIONS DURING THIS ADMISSION:  No consultations obtained.   HISTORY AND PHYSICAL:  Refer dictated H&P done by Dr. Ardyth Harps on  December 18, 2007.   HOSPITAL COURSE:  1. Pulmonary embolus with lung infarct and severe pleuritic chest      pain.  Leonard Berg was admitted to Acute Care Unit of [facility].  He was placed on subcutaneous Lovenox, oral Coumadin, as      well as frequent intravenous doses of opioids to  control his pain.      On hospital day #2, he was also given intravenous Toradol to      further control the pleuritic chest pain with better response.  By      today, December 20, 2007, the patient is still experiencing      significant pleuritic chest pain, which is impeding his discharge.      His Lovenox and Coumadin are adjusted per the registered pharmacist      and we will also check in a lower extremity venous Doppler to rule      out any residual deep venous thrombosis.  2. Hyperlipidemia.  Leonard Berg had a fasting lipid panel, which      indicated an LDL level of 156.  Given his risk factors profile and      age, we elected to starting him on statin using Zocor 20 mg at      bedtime.  3. Right upper quadrant pain.  Upon admission, the emergency room      physician was extremely concerned about the patient's right upper      quadrant pain.  We thought that this was referred pain from the      patient's lung infarct.  An abdominal ultrasound was within normal      limits and liver function tests were all within normal limits.      Leonard Berg, M.D.  Electronically Signed     SL/MEDQ  D:  12/20/2007  T:  12/21/2007  Job:  [id number]   cc:   Loma Sender

## 2010-08-29 NOTE — Op Note (Signed)
NAME:  Leonard Berg, Leonard Berg NO.:  [id number]   MEDICAL RECORD NO.:  [id number]                   PATIENT TYPE:  AMB   LOCATION:  DSC                                  FACILITY:  [facility]   PHYSICIAN:  Loreta Ave, M.D.              DATE OF BIRTH:  1930-02-23   DATE OF PROCEDURE:  11/07/2003  DATE OF DISCHARGE:                                 OPERATIVE REPORT   PREOPERATIVE DIAGNOSIS:  Medial and lateral meniscal tears, left knee.   POSTOPERATIVE DIAGNOSIS:  Medial and lateral meniscal tears, left knee with  some chondromalacia patella.   PROCEDURE:  Left knee examination under anesthesia, arthroscopy with partial  medial and lateral meniscectomy.  Chondroplasty of the patellofemoral joint.   SURGEON:  Loreta Ave, M.D.   ASSISTANT:  Arlys John D. Petrarca, P.A.-C.   ANESTHESIA:  Knee block with sedation.   SPECIMENS:  None.  Cultures; none.   COMPLICATIONS:  None.  Dressing; soft compressive.   DESCRIPTION OF PROCEDURE:  The patient was brought to the operating room and  after adequate anesthesia had been obtained, the left knee was examined.  Good motion and stability.  Good patellofemoral tracking.  Tourniquet and  leg holder applied.  Leg prepped and draped.  Three portals created, one  superolateral and one each medial and lateral parapatellar.  The inflow  catheter introduced and the knee distended, arthroscope introduced and the  knee inspected.  Diffuse grade III chondromalacia pink and the patella  debrided.  Small focal area of grade IV very medial on the patella.  Trochlea looked good and tracking was good.  The medial meniscus complex  tearing of the posterior third taken down to a stable rim, tapered into  remaining meniscus salvaging some in the back.  No degenerative changes of  note in there.  Cruciate ligament was intact.  Lateral meniscus complex  tearing circumferentially.  Saucerized out leaving a little bit of the rim  all the  way around removing all unstable torn meniscal fragments.  Only some  mild degenerative changes in that compartment as well.  At completion, the  entire knee was examined.  All chondral fragments were removed.  No other  findings appreciated.  Instruments and fluid removed.  The portals of the  knee injected with Marcaine.  Portals closed with 5-0 nylon.  Sterile  compressive dressing applied.  Anesthesia reversed.  Brought to the recovery  room.  Tolerated the surgery well with no complications.                                               Loreta Ave, M.D.    DFM/MEDQ  D:  11/07/2003  T:  11/07/2003  Job:  [id number]

## 2010-08-29 NOTE — Op Note (Signed)
NAME:  Leonard Berg, Leonard Berg NO.:  [id number]   MEDICAL RECORD NO.:  [id number]          PATIENT TYPE:  AMB   LOCATION:  DSC                          FACILITY:  [facility]   PHYSICIAN:  Leonides Grills, M.D.     DATE OF BIRTH:  09/05/1929   DATE OF PROCEDURE:  01/26/2006  DATE OF DISCHARGE:                                 OPERATIVE REPORT   PREOPERATIVE DIAGNOSES:  1. Left infected, nonunited ankle fusion.  2. Complication of hardware, left ankle.  3. Left anterior distal tibial spurs.   POSTOPERATIVE DIAGNOSES:  1. Left infected, nonunited ankle fusion.  2. Complication of hardware, left ankle.  3. Left anterior distal tibial spurs.   OPERATIONS:  1. Hardware removal, deep, left ankle.  2. Excision, anterior distal tibial spurs.  3. Antibiotic bead placement, left ankle.  4. Implant antibiotic beads, six beads anterior, anterolaterally, and one      bead medially.  5. DePuy cement with one vial of tobramycin 40 mg and vancomycin 1 g.  6. Local bone graft, left ankle.   SURGEON:  Leonides Grills, M.D.   ASSISTANT:  Evlyn Kanner, P.A.-C.   ANESTHESIA:  General.   ESTIMATED BLOOD LOSS:  Minimal.   TOURNIQUET TIME:  Approximately 1 hour 20 minutes.   COMPLICATIONS:  None.   DISPOSITION:  Stable to the PAR.   INDICATIONS:  This is a 77 year old gentleman, who has had 2 previous  attempts at ankle fusion.  He has subsequently developed a deep infection  with intermittent drainage out the medial wound.  He was consented for the  above procedure.  All risks, including infection, nerve and vessel injury,  persistent infection, osteomyelitis, nonunion, malunion, and surgery in 6  weeks for definitive fixation with iliac crest bone graft were all  explained.  Questions were encouraged and answered.   DESCRIPTION OF PROCEDURE:  The patient was brought to the operating room and  placed in supine position initially, after adequate general endotracheal  tube anesthesia  was administered and Ancef 1 g IV piggyback was  administered.  The patient was then placed in a floppy lateral position on a  beanbag.  All bony prominences were well padded and the left lower extremity  was then prepped and draped in a sterile manner over a proximally placed  thigh tourniquet.  The limb was gravity exsanguinated and the tourniquet was  elevated to 290 mmHg.  A longitudinal incision over the medial head of the  screw was then made.  Dissection was carried down to bone.  Bone encased the  head of the screw.  There was a purulent-type material around this area.  Cultures, aerobic and anaerobic, were obtained.  We then chiseled out the  bone around the screw head and removed the screw.  This was intact with no  evidence of hardware failure.  We left the wound open, irrigated it  copiously.  We then placed a longitudinal incision longitudinally over the  anterolateral lateral malleolus.  Dissection was carried down directly to  bone.  Hemostasis was obtained.  Soft tissues were then elevated off the  anterior aspect  of the lateral malleolus and the anterior aspect of the  distal tibia.  We then under C-arm guidance found the posterolateral fibular  screw that was recessed within the bone.  This needed to be chiseled out as  well, and once this was chiseled out, this was also removed.  There was no  evidence of infection in this area, and once the screw was removed, the  local bone graft that was obtained throughout the procedure from the  anterior distal tibial osteophyte excision, which was done with a curved 1/4-  inch osteotome, protecting the soft tissues anteriorly, was packed into this  hole as well.  Once this was packed into the defect in the lateral  malleolus, and once the entire anterior aspect of the ankle was debrided of  osteophyte, and this was verified under C-arm guidance in the lateral plane,  we then placed 6 antibiotic beads on a #2 FiberWire.  This was a  [employer] with 1 vial of tobramycin, which was 40 mg, and 2 vials of  vancomycin, which was equivalent to 1 g, into the powder, mixed this and  made 6 approximately 0.5-cm beads on the string.  This was placed in the  anterior aspect of the ankle and extended anterolaterally.  We placed 1 bead  in the medial incision as well.  Prior to this, the area was copiously  irrigated with normal saline.  There was no evidence of infection on the  anterior or anterolateral aspects of the ankle as well.  The tourniquet was  deflated.  Hemostasis was obtained.  There was a palpable dorsalis pedis  pulse.  Subcu was closed with 3-0 Vicryl.  The skin was closed with 4-0  nylon.  A sterile dressing was applied.  A modified Jones dressing was  applied.  The patient went stable to the PAR.      Leonides Grills, M.D.  Electronically Signed     PB/MEDQ  D:  01/26/2006  T:  01/26/2006  Job:  [id number]

## 2010-08-29 NOTE — Op Note (Signed)
   NAME:  Leonard Berg, Leonard Berg                      ACCOUNT NO.:  [id number]   MEDICAL RECORD NO.:  [id number]                   PATIENT TYPE:  INP   LOCATION:  NA                                   FACILITY:  [facility]   PHYSICIAN:  Kaylyn Layer. Michelle Piper, M.D.                DATE OF BIRTH:  [birth year]   DATE OF PROCEDURE:  02/15/2002  DATE OF DISCHARGE:                                 OPERATIVE REPORT   PROCEDURE PERFORMED:  Epidural catheter placement for postoperative pan  relief.   ANESTHESIOLOGIST:  Kaylyn Layer. Michelle Piper, M.D.   INDICATIONS FOR PROCEDURE:  I was consulted by Dr. Richardson Landry to place an  epidural catheter into the patient for postoperative pain relief.  I had the  opportunity to discuss the procedure with the patient preoperatively and he  agreed with catheter placement after the risks and benefits were explained.   DESCRIPTION OF PROCEDURE:  At the end of the procedure prior to emergence  from anesthesia, the patient was turned in the right lateral decubitus  position.  His back was prepped with Betadine and using a #17 Tuohy needle  the epidural space was easily cannulated at the L3-4 interspace at the  midline using loss of resistance technique.  The catheter was inserted 5 cm  into the epidural space and the needle removed.  Negative aspiration.  100  mcg of fentanyl and 10 cc of 0.5% Xylocaine was injected.  The catheter was  firmly fixed to the patient's back.  He was then turned supine, extubated  and taken to recovery room in stable condition.   In the PACU a fentanyl/Marcaine infusion was begun and he will be followed  on the floor by the anesthesiology service.                                                Kaylyn Layer Michelle Piper, M.D.    KDO/MEDQ  D:  02/15/2002  T:  02/15/2002  Job:  [id number]

## 2010-08-29 NOTE — Op Note (Signed)
NAME:  Leonard Berg, Leonard Berg NO.:  [id number]   MEDICAL RECORD NO.:  [id number]                   PATIENT TYPE:  INP   LOCATION:  NA                                   FACILITY:  [facility]   PHYSICIAN:  Loreta Ave, M.D.              DATE OF BIRTH:  03-08-1930   DATE OF PROCEDURE:  02/15/2002  DATE OF DISCHARGE:                                 OPERATIVE REPORT   PREOPERATIVE DIAGNOSES:  End stage degenerative arthritis, right knee with  varus alignment and flexion contracture.   POSTOPERATIVE DIAGNOSES:  End stage degenerative arthritis, right knee with  varus alignment and flexion contracture with grade 4 changes throughout.   OPERATION PERFORMED:  Right total knee replacement with appropriate soft  tissue balancing.  Press-fit microstructured #9 posterior stabilizing  femoral component.  Cemented #11 tibial component with 12 mm flexed  posterior stabilizing polyethylene insert.  Cemented recessed nonmetal  backed 32 mm patellar component.   SURGEON:  Loreta Ave, M.D.   ASSISTANT:  Arlys John D. Petrarca, P.A.-C.   ANESTHESIA:  General.   ESTIMATED BLOOD LOSS:  Minimal.   TOURNIQUET TIME:  One hour 15 minutes.   SPECIMENS:  Excised bone and soft tissue.   CULTURES:  None.   COMPLICATIONS:  None.   DRESSING:  Soft compressive.   DRAINS:  Hemovac times two.   DESCRIPTION OF PROCEDURE:  The patient was brought to the operating room and  after adequate anesthesia had been obtained,  knee examined.  10 to 15  degree flexion contracture.  Further flexion to 90 degrees.  Alignment  slight varus.  Stable ligaments.  Good patellofemoral stability.  Tourniquet  had been applied.  Prepped and draped in the usual sterile fashion.  Exsanguinated with elevation and Esmarch.  Tourniquet inflated to 350 mmHg.  Straight incision above the patella down to the tibial tubercle.  Hemostasis  with cautery.  Medial parapatellar arthrotomy.  Grade 4 changes  throughout.  Remnants of menisci, contracted cruciate ligaments, periarticular spurs,  excessive fat pad, all excised.  Distal femur exposed.  Intramedullary guide  placed.  Distal cut removing 12 mm set at 5 degrees of valgus.  Sized to a  #9 component.  Jigs put in place.  Definitive cuts made for the posterior  stabilizing component.  Tibia exposed.  Tibial spine removed with a saw.  Intramedullary guide placed.  Proximal cut removing 6 mm from the 5 degree  posterior slope cut.  The patella was then sized, reamed with a drill for a  32 mm component.  Trials put in place.  Good fitting with the #9 on the  femur and #11 on the tibia.  With the 12 mm insert, I had full extension,  full flexion, good stability, good alignment and no lift off in flexion.  Also good patellofemoral tracking.  Medial capsule release had been  performed to help balance the knee.  All  trials were removed after the tibia  was marked for appropriate rotation and then hand reamed.  The knee was  copiously irrigated with a pulse irrigating device.  Cement prepared.  Placed on tibial component which was hammered into place and polyethylene  attached.  Femoral component seated.  Knee reduced.  The patellar component  cemented in the patella.  All excessive cement removed.  Once the cement had  hardened, the knee was re-examined.  Full extension, full flexion, good  stability, good patellofemoral tracking.  Hemovacs placed, brought out  through separate stab wounds.  Arthrotomy closed with #1 Vicryls, skin and  subcutaneous tissues with Vicryl and staples.  Margins of the wound and knee  injected with Marcaine.  Sterile compressive dressing applied.  Tourniquet  deflated and removed.  Knee immobilizer applied.  Anesthesia reversed.  Brought to recovery room.  Tolerated surgery well without complication.                                                 Loreta Ave, M.D.    DFM/MEDQ  D:  02/15/2002  T:   02/15/2002  Job:  [id number]

## 2010-08-29 NOTE — Op Note (Signed)
NAME:  Leonard Berg, Leonard Berg NO.:  [id number]   MEDICAL RECORD NO.:  [id number]                   PATIENT TYPE:  AMB   LOCATION:  DSC                                  FACILITY:  [facility]   PHYSICIAN:  Loreta Ave, M.D.              DATE OF BIRTH:  Dec 06, 1929   DATE OF PROCEDURE:  02/28/2003  DATE OF DISCHARGE:                                 OPERATIVE REPORT   PREOPERATIVE DIAGNOSIS:  Nonunited left ankle arthrodesis.   POSTOPERATIVE DIAGNOSIS:  Nonunited left ankle arthrodesis.   OPERATION PERFORMED:  Left ankle examination under anesthesia.  Removal of  two previous transfixion screws.  Extensive arthroscopic debridement and  freshening of ankle nonunion.  Augmentation with cancellous matrix  allograft.  Refixation and fusion with 7.3 mm lag screws times two.   SURGEON:  Loreta Ave, M.D.   ASSISTANT:  Arlys John D. Petrarca, P.A.-C.   ANESTHESIA:  General.   ESTIMATED BLOOD LOSS:  Minimal.   TOURNIQUET TIME:  One hour 20 minutes.   SPECIMENS:  None.   CULTURES:  None.   COMPLICATIONS:  None.   DRESSING:  Soft compressive with short leg splint.   DESCRIPTION OF PROCEDURE:  The patient was brought to the operating room and  after adequate anesthesia had been obtained, appropriate leg support and  tourniquet applied to the left leg.  Prepped and draped in the usual sterile  fashion.  Exsanguinated with elevation and Esmarch.  Tourniquet inflated to  350 mmHg.  Ankle examined with fluoroscopic guidance.  With the two screws  still in place, there was no obvious gross motion with exam and fluoroscopic  assessment.  Longitudinal incision over the fibular and tibial screws which  were then backed out across the ankle fusion.  This was then re-examined  with fluoroscopic guidance and there was a jog of motion present, confirming  lack of complete bony union.  The tracks for these screws were left in place  but the screws removed.  Anterolateral  and anteromedial portals developed.  The ankle was then entered with the arthroscope.  Extensive debridement of  all remaining fibrous tissue and then bringing the ankle back to bleeding  cancellous bone on both sides, confirmed arthroscopically, fluoroscopically.  Once this was achieved, I opened up the medial portal to allow access to  place the cancellous matrix allograft material.  This was firmly packed into  the ankle itself utilizing 10mL of cancellous allograft matrix.  Once  complete, two guidewires were then brought across the ankle.  I used the  same entrance holes in the fibula and tibia.  The fibular screw was  lengthened to create good capture in bone sliding more distal.  The medial  tibial screw was redirected slightly more anteriorly to be able to get this  into fresh bone.  Guidewires were placed and then lag screws were firmly  placed across the ankle in good position.  Nice  solid stable fixation,  clinically and fluoroscopically.  Good placement of the screws to avoid  penetration out of the tibiotalar region.  Once were both countersunk and  firmly tightened, the entire construct was examined clinically and  fluoroscopically with excellent position with a plantar grade foot and good  contact with the graft and fixation technique.  Wounds were all irrigated.  Skin closed with nylon.  Margins of the wound injected with Marcaine.  Sterile compressive dressing and short leg splint applied.  Tourniquet  deflated and removed.  Anesthesia reversed.  Brought to recovery room.  Tolerated surgery well without complication.                                               Loreta Ave, M.D.    DFM/MEDQ  D:  02/28/2003  T:  03/01/2003  Job:  [id number]

## 2010-08-29 NOTE — Discharge Summary (Signed)
NAME:  Leonard Berg, Leonard Berg NO.:  [id number]   MEDICAL RECORD NO.:  [id number]                   PATIENT TYPE:  INP   LOCATION:  5007                                 FACILITY:  [facility]   PHYSICIAN:  Loreta Ave, M.D.              DATE OF BIRTH:  05-May-1929   DATE OF ADMISSION:  02/15/2002  DATE OF DISCHARGE:  02/19/2002                                 DISCHARGE SUMMARY   ADMISSION DIAGNOSIS:  Advanced degenerative joint disease of the right knee.   DISCHARGE DIAGNOSES:  1. Advanced degenerative joint disease of the right knee.  2. Hypothyroidism.   PROCEDURE:  Left total knee replacement.   HISTORY:  This patient is a very pleasant 77 year old male with bilateral  DJD of the knees, right greater than left.  He has been tried with  conservative treatment including viscous supplementation via Synvisc.  He  has continued to have progressive pain, discomfort, and difficulties with  activities of daily living.  He does radiographically have end-stage DJD.  Indicated now for a right total knee replacement.   HOSPITAL COURSE:  A 77 year old male admitted February 15, 2002 and after  appropriate laboratory studies were obtained as well as 1 g of Ancef IV on  cal to the operating room was taken to the operating room where he underwent  a right total knee replacement.  He tolerated the procedure well.  An  epidural was placed for postoperative pain management.  Ancef 1 g IV q.8h.  for three doses continued postoperatively.  Heparin was started at 5000  units subcutaneous q.12h. until Coumadin became therapeutic.  Consultation  with PT and OT and rehab were made.  Physical therapy for ambulation and  weightbearing as tolerated on the right.  A Foley was placed  intraoperatively.  He had some difficulties with his epidural and this was  discontinued on November 6.  A Dilaudid PCA pump was begun.  He did have  preoperative change on his chest x-ray and a limited  CT scan was ordered.  He did have an early ileus and this was treated with Reglan 10 mg q.12h. for  two doses and diet was then fully advanced.  He had excellent progression  without any type of NG tubes.  His dressing was changed on postoperative day  #2.  The remaining of his hospital course was uneventfully and he was  discharged on November 9 to return back to our office in 7-10 days for  follow-up.   LABORATORY DATA:  EKG revealed sinus tachycardia with right bundle-branch  block; inferior infarct, age indeterminate.  Radiographically, x-rays of  November 8:  Chest x-ray reveals bibasilar scarring or atelectasis, in  particular at the left lung base per the density seen on the chest x-ray;  this was CT scan.  Chest x-ray of November 5 revealed mild cardiomegaly  present. Mediastinum appears otherwise normal.  Tampering of the peripheral  pulmonary  vasculature suggesting a possibility of COPD.  There is an opacity  at the left lung base with slightly nodular inferior component likely  representing scar or subsegmental atelectasis; however, since there is no  prior radiograph, CT scan was suggested.  Right knee films of February 15, 2002 reveals right knee arthroplasty without apparent complications.  Laboratory studies of November 5:  Hemoglobin 15.1; hematocrit 45.3%; white  count 9300; platelets 236,000.  Discharge hemoglobin 10.6, hematocrit 30.5%.  Chemistries of November 5:  Sodium 138, potassium 4.2, chloride 104, CO2 25,  glucose 103, BUN 15, creatinine 1.3, calcium 9.3, total protein 6.8, albumin  3.7, AST 39, AST 38, ALP 85, and total bilirubin 0.9.  Discharge sodium 138,  potassium 4.1, chloride 101, CO2 31, glucose 114, BUN 11, creatinine 1.1,  calcium 9.0.  Urinalysis was benign for a void urine.  Blood type A  positive, antibody screen negative.    DISCHARGE MEDICATIONS:  1. Given a prescription for Percocet 5/325 one to two q.4h. p.r.n. pain.  2. Coumadin 5 mg one  tablet daily.  3. Senokot two tablets with dinner.  4. Colace 100 mg b.i.d.  5. Keflex 500 mg one tablet q.i.d.   ACTIVITY:  There is no restriction in activity.  Ambulate as taught in  physical therapy.   DIET:  No restrictions in diet.   WOUND CARE:  Keep the wound clean and dry and cover with a dressing.  Call  for increased pain or temperature or redness is noted.   FOLLOW-UP:  He will need to make an appointment for February 28, 2002 for  follow-up.      Oris Drone Petrarca, P.A.-C.                Loreta Ave, M.D.    BDP/MEDQ  D:  03/11/2002  T:  03/11/2002  Job:  [id number]

## 2010-08-29 NOTE — Discharge Summary (Signed)
NAMEMARLOW, Leonard Berg NO.:  [id number]   MEDICAL RECORD NO.:  [id number]          PATIENT TYPE:  INP   LOCATION:  1614                         FACILITY:  [facility]   PHYSICIAN:  Madlyn Frankel. Charlann Boxer, M.D.  DATE OF BIRTH:  [birth year]   DATE OF ADMISSION:  05/01/2008  DATE OF DISCHARGE:  05/04/2008                               DISCHARGE SUMMARY   ADMITTING DIAGNOSES:  1. Osteoarthritis.  2. Deep vein thrombosis on Coumadin.  3. Hypothyroidism.  4. Benign prostatic hypertrophy.  5. Hypertension.   DISCHARGE DIAGNOSES:  1. Osteoarthritis.  2. Deep vein thrombosis on Coumadin.  3. Hypothyroidism.  4. Benign prostatic hypertrophy.  5. Hypertension.   HISTORY OF PRESENT ILLNESS:  A 77 year old male with history of right  hip pain secondary to osteoarthritis.  It is refractory to all  conservative treatment.  He did have a recent history of a DVT and has  been on chronic Coumadin.  It has been at least 6 months since onset.  He was presurgically assessed prior to surgery.   CONSULTANTS:  None.   PROCEDURE:  A right total hip replacement by surgeon Dr. Durene Romans.  Assistant [employer] PA-C.   LABORATORY DATA:  CBC final reading showed his white blood cell count of  7.4, hemoglobin 10.6, hematocrit 30.8, platelets 137.  White cell  differential all within normal limits.  Coagulation was 0.9 at the time  of surgery.  At the time of discharge, his PT was 18.7, his INR was 1.5.  Metabolic panel - sodium 136, potassium 4, glucose 114, BUN 14,  creatinine 1.2.  GFR was 59 and his calcium was 88.1.  His UA was  negative.   RADIOLOGY:  Portable pelvis showed good position and alignment of the  right hip arthroplasty.   HOSPITAL COURSE:  The patient underwent right total hip replacement and  was admitted to orthopedic floor.  He remained hemodynamically  orthopedically stable.  Pain was well controlled with oxycodone.  Dressing was changed on a daily basis with no  significant drainage from  the wound.  Coumadin was started on postoperative day #1.  He did have  some constipation on his last day.  We gave him some magnesium citrate.  He did have a bowel movement before being discharged home.   DISCHARGE DISPOSITION:  Discharged home with home health care physical  therapy in stable and improved condition.   DISCHARGE DIET:  Heart-healthy.   DISCHARGE WOUND CARE:  Keep dry.  Change dressing daily.   DISCHARGE PHYSICAL THERAPY:  Weightbearing as tolerated with the use of  a rolling walker.   DISCHARGE MEDICATIONS:  1. Coumadin.  Resume previous dosage of 5 mg 1-1/2 tabs daily.  2. Robaxin 500 mg p.o. q.6h.  3. Iron 325 mg p.o. t.i.d.  4. Colace 100 mg p.o. b.i.d.  5. MiraLax 17 grams p.o. daily.  6. Oxycodone 5 mg one to three p.o. q.3-4h. p.r.n. pain.  7. Tylenol 650 mg p.o. q.6h.  8. Flomax 0.4 mg q.h.s.  9. Toprol 50 mg p.o. q.a.m.  10.Levothyroxine 125 mcg p.o. q.a.m.  11.Zantac 150 mg p.o.  q. evening.   DISCHARGE FOLLOWUP:  Follow up with Dr. Charlann Boxer at phone number [phone] in  2 weeks for a wound check.     ______________________________  Yetta Glassman. Loreta Ave, [place]      Madlyn Frankel. Charlann Boxer, M.D.  Electronically Signed    BLM/MEDQ  D:  05/23/2008  T:  05/23/2008  Job:  [id number]   cc:   Loma Sender  Fax: [phone]   Vesta Mixer, M.D.  Fax: [phone]

## 2010-08-29 NOTE — Op Note (Signed)
[place]  Patient:    Leonard Berg, Leonard Berg Visit Number: [id number] MRN: [id number]          Service Type: DSU Location: [facility] Attending Physician:  Colbert Ewing Dictated by:   Loreta Ave, M.D. Proc. Date: 07/07/01 Admit Date:  07/07/2001 Discharge Date: 07/08/2001                             Operative Report  PREOPERATIVE DIAGNOSIS:  End-stage degenerative arthritis, left ankle with varus alignment, and periarticular spurs.  POSTOPERATIVE DIAGNOSIS:  End-stage degenerative arthritis, left ankle with varus alignment, and periarticular spurs.  OPERATIVE PROCEDURE:  Left ankle exam under anesthesia, arthroscopy with extensive debridement, and denuding of remaining cartilage, arthrodesis utilizing cannulated 7.3 screws x2.  SURGEON:  Loreta Ave, M.D.  ASSISTANT:  Arlys John D. Petrarca, P.A.-C.  ANESTHESIA:  General.  ESTIMATED BLOOD LOSS:  Minimal.  TOURNIQUET TIME:  One hour.  SPECIMENS:  None.  CULTURES:  None.  COMPLICATIONS:  None.  DRESSING:  Self-compressive with short leg splint.  DESCRIPTION OF PROCEDURE:  The patient was brought to the operating room and after adequate anesthesia had been obtained, the tourniquet was applied to the upper aspect of the left leg.  _______ for arthroscopy also positioned. Prepped and draped in the usual sterile fashion.  Exsanguinated with elevation and Esmarch.  Tourniquet inflated to 350 mmHg.  Fluoroscopic guidance.  Ankle examined.  Five degrees of varus alignment, not really correctable to neutral. Lacked five degrees from plantigrade position because of anterior spurs. Anteromedial and anterolateral portals entering the ankle.  All of the periarticular spurs were removed so that I could position the ankle to the neutral position.  Grade 4 changes over most of the ankle.  Remaining articular cartilage debrided to healthy cancellous bleeding bone on all aspects of the  ankle.  Once this was complete, with fluoroscopic guidance, I could position the ankle normally to varus, valgus, as well as plantigrade.  The ankle was held in this position.  Incisions were made over the distal fibula and distal tibia.  The guidewires for the 7.3 screws were padded from the distal fibula across the ankle into the anterior aspect of the talus and from the tibia across the ankle into the posterior aspect of the talus.  Position and compression confirmed fluoroscopically with good ankle position.  The guidewires were measured and then drilled for screws.  A 55 mm with 16 mm thread screw was placed medially and a 70 mm with 32 mm thread was placed laterally over the guidewires.  When these were firmly tightened down, I had a nice solid position and fixation of the ankle in neutral position in both the AP and lateral plane.  The wounds were all irrigated and closed with nylon.  The margin of the wound injected with Marcaine.  A sterile compressive dressing applied with short leg splint.  The tourniquet deflated.  Anesthesia reversed.  Brought to the recovery room.  Tolerated surgery well without complications. Dictated by:   Loreta Ave, M.D. Attending Physician:  Colbert Ewing DD:  07/07/01 TD:  07/09/01 Job: [id number]

## 2010-08-29 NOTE — Op Note (Signed)
NAME:  Leonard Berg, Leonard Berg NO.:  [id number]   MEDICAL RECORD NO.:  [id number]          PATIENT TYPE:  AMB   LOCATION:  DSC                          FACILITY:  [facility]   PHYSICIAN:  Leonides Grills, M.D.     DATE OF BIRTH:  04-28-1929   DATE OF PROCEDURE:  03/09/2006  DATE OF DISCHARGE:                               OPERATIVE REPORT   PREOPERATIVE DIAGNOSIS:  1. Left ankle arthritis, status post ankle fusion and subsequent      infected nonunion.  2. Retained antibiotic beads deep left ankle.  3. Left tight Achilles tendon.   POSTOPERATIVE DIAGNOSES:  1. Left ankle arthritis, status post ankle fusion and subsequent      infected nonunion.  2. Retained antibiotic beads deep left ankle.  3. Left tight Achilles tendon.   OPERATION:  1. Left ankle fusion.  2. Left fibular osteotomy.  3. Left percutaneous tendo-Achilles lengthening.  4. Removal of antibiotic beads deep left ankle to bone.  5. Iliac crest bone graft.   ANESTHESIA:  General with block.   SURGEON:  Leonides Grills, M.D.   ASSISTANT:  Evlyn Kanner, P.A.-C.   ESTIMATED BLOOD LOSS:  Minimal.   TOURNIQUET TIME:  2 hours.   COMPLICATIONS:  None.   DISPOSITION:  Stable to PR.   INDICATIONS:  This is a 77 year old gentleman who has had infected left  ankle fusion nonunion.  This a two-stage procedure.  He presents now for  the second and final stage. He was consented for the above procedure.  All risks which include infection, neuro vessel injury, nonunion,  malunion, hardware irritation, hardware failure, stiffness, arthritis of  the juxta-articular joints and deep infection, possible future surgery  were all explained.  Questions were encouraged and answered.   OPERATION:  Patient brought to the operating room, placed in supine  position initially.  After adequate general endotracheal tube anesthesia  with block was administered as well as Ancef 1 g IV piggyback, the  patient was then placed in a  sloppy lateral position with the operative  side up on a beanbag.  All bony prominences well padded.  Left lower  extremity as well as laterally crest bone graft site were prepped and  draped in a sterile manner over a proximally placed thigh tourniquet.  We started procedure with a longitudinal incision over the left iliac  crest bone graft site.  Dissection was carried down through skin.  Hemostasis was obtained.  Dissection was carried down directly to bone  iliac tuberosity was encountered.  Soft tissue was then elevated on both  the inner and outer tables over the iliac tuberosity.  We then made a  cortical window, elevated the bone and then removed cancellus graft from  the iliac crest bone graft site.  This was placed on  back table for  later fusion. The area was copiously irrigated with normal saline.  Trap  door was closed with 0 Vicryl suture through the drill holes.  Area was  once again copiously irrigated with normal saline.  Deep tissue closed  with 0 Vicryl.  Subcu was closed with 2-0 Vicryl.  Skin was closed 4-0  Monocryl subcuticular stitch.  Steri-Strips were applied.  We then  gravity exsanguinated the left lower extremity.  Tourniquet elevated 290  mmHg.  Longitudinal incision over the previous medial incision was then  made.  Dissection was carried down through skin.  Hemostasis was  obtained.  Dissection was carried down bluntly to bone.  The antibiotic  bead was encountered.  This was then removed.  This was one bead. There  were six beads laterally.  One bead medially.  Once the medial bead  single bead was removed, we then made a longitudinal incision over the  lateral aspect of the ankle with a previous incision just anterior to  the lateral malleolus.  Dissection was carried down directly to bone  with full-thickness flap.  This was elevated anteriorly off the bone.  The antibiotic beads were encountered and each individual bead was  removed.  All were intact  totaling six beads.  Soft tissue was elevated  off the anterior aspect of the ankle.  A rule was then used and  approximately 6 to 7 cm from the tip the lateral malleolus osteotomy the  fibula was made and a centimeter of fibular bone was removed, placed on  the back table.  Syndesmosis was then taken down with a curved quarter  inch osteotome and a rongeur.  Soft tissue was removed from this  interval as well.  Multiple 2-mm drill holes were placed a lateral  portion of the tibia preparing this for later fusion using the fibula as  a biological plate.  We then went into the ankle joint with a curved  quarter inch osteotome.  This already had a fibrous union.  Had to break  this up as well.  This took quite some time.  The fibrous tissue was  removed with a rongeur, curette and curved quarter inch osteotome.  We  also removed the os trigone off the posterior aspect of the talus and  used this as local bone graft as well.  There was a large defect in the  tibia posteriorly.  A portion of the anterior tibia was also removed to  try to get this in a better position.  We then performed a percutaneous  tendo Achilles lengthening with two medial one lateral hemisection the  Achilles tendon.  This had excellent release of tight Achilles tendon  and we were able to dorsiflex and posterior translate the talus much as  possible.  We then placed a home run screw posterolaterally and  anteromedially into the anterior medial body of the talus.  This was  done with a 4.5, 3.2 mm hole respectively and a 6.5 mm 60 mm threaded  cancellous screw was then placed.  This was done with the ankle held in  reduced position.  This had excellent purchase and maintenance of the  correction.  We then placed a screw from anterior laterally to the  center of the body.  Again this was done with 4.5, 3.2 mm drill holes  respectively and bur was used to notch the area as well to recess the head adequately.  We then  extended the incision medially, dorsally  placed a new drill hole in the medial aspect of the tibia and talus  respectively using 4.5, 3.2-mm holes respectively.  6.5 mm, 16 mm  threaded cancellous screws placed,  this again had good purchase and  maintenance of the correction.  Also of note prior to reduction and  placement talus cancellous  bone graft was obtained from iliac crest was  packed into the ankle joint as well.  We then went back to lateral  aspect the ankle, bone graft bone graft was applied to the lateral  aspect in the interval between the fibula and lateral aspect tibia  respectively.  Another osteotomy was made in the sagittal plane on the  medial aspect of the fibula.  Multiple drill holes were placed remaining  part the fibula as well and tibia and talus respectively for iatrogenic  vascular channels.  We then reduced the fibula and used a two-point  reduction clamp.  We then placed two lag screws.  One was a 4.5-mm fully  threaded cortical screw using 4.5, 3.2 mm drill holes respectively.  This had excellent purchase and maintenance of the correction.  This was  done with a washer and another 6.5 mm 32 mm threaded cancellous screw  was placed on the distal aspect of the fibula into the talus  respectively.  Final stress x-rays were obtained AP lateral mortis views  and showed fixation excellent position fusion excellent as well.  No  violation subtalar joint.  Tourniquet deflated, hemostasis was obtained.  There was no  pulsatile bleeding, palpable dorsalis pedis, posterior tibial pulses.  Subcu was closed with 2-0 Vicryl.  Skin was closed with 4-0 nylon over  all wounds.  Sterile dressing was applied.  Modified Jones dressing was  applied and the patient was stable to PAR.      Leonides Grills, M.D.  Electronically Signed     PB/MEDQ  D:  03/09/2006  T:  03/09/2006  Job:  [id number]

## 2010-09-12 ENCOUNTER — Encounter (INDEPENDENT_AMBULATORY_CARE_PROVIDER_SITE_OTHER): Payer: Medicare Other | Admitting: Cardiology

## 2010-09-12 DIAGNOSIS — I701 Atherosclerosis of renal artery: Secondary | ICD-10-CM

## 2010-09-18 ENCOUNTER — Encounter: Payer: Self-pay | Admitting: Cardiovascular Disease

## 2010-10-01 ENCOUNTER — Telehealth: Payer: Self-pay | Admitting: Cardiovascular Disease

## 2010-10-01 NOTE — Telephone Encounter (Signed)
Per pt calling back to speak with christine

## 2010-10-02 NOTE — Telephone Encounter (Signed)
Pt given results of renal artery doppler.

## 2010-10-16 ENCOUNTER — Encounter: Payer: Self-pay | Admitting: Cardiovascular Disease

## 2010-11-25 IMAGING — CR DG CHEST 2V
2 series · 2 of 2 positions shown · non-contrast
Comparison: 11/23/2009

CLINICAL DATA: Status post CABG.

CHEST - 2 VIEW

[w chest pa]
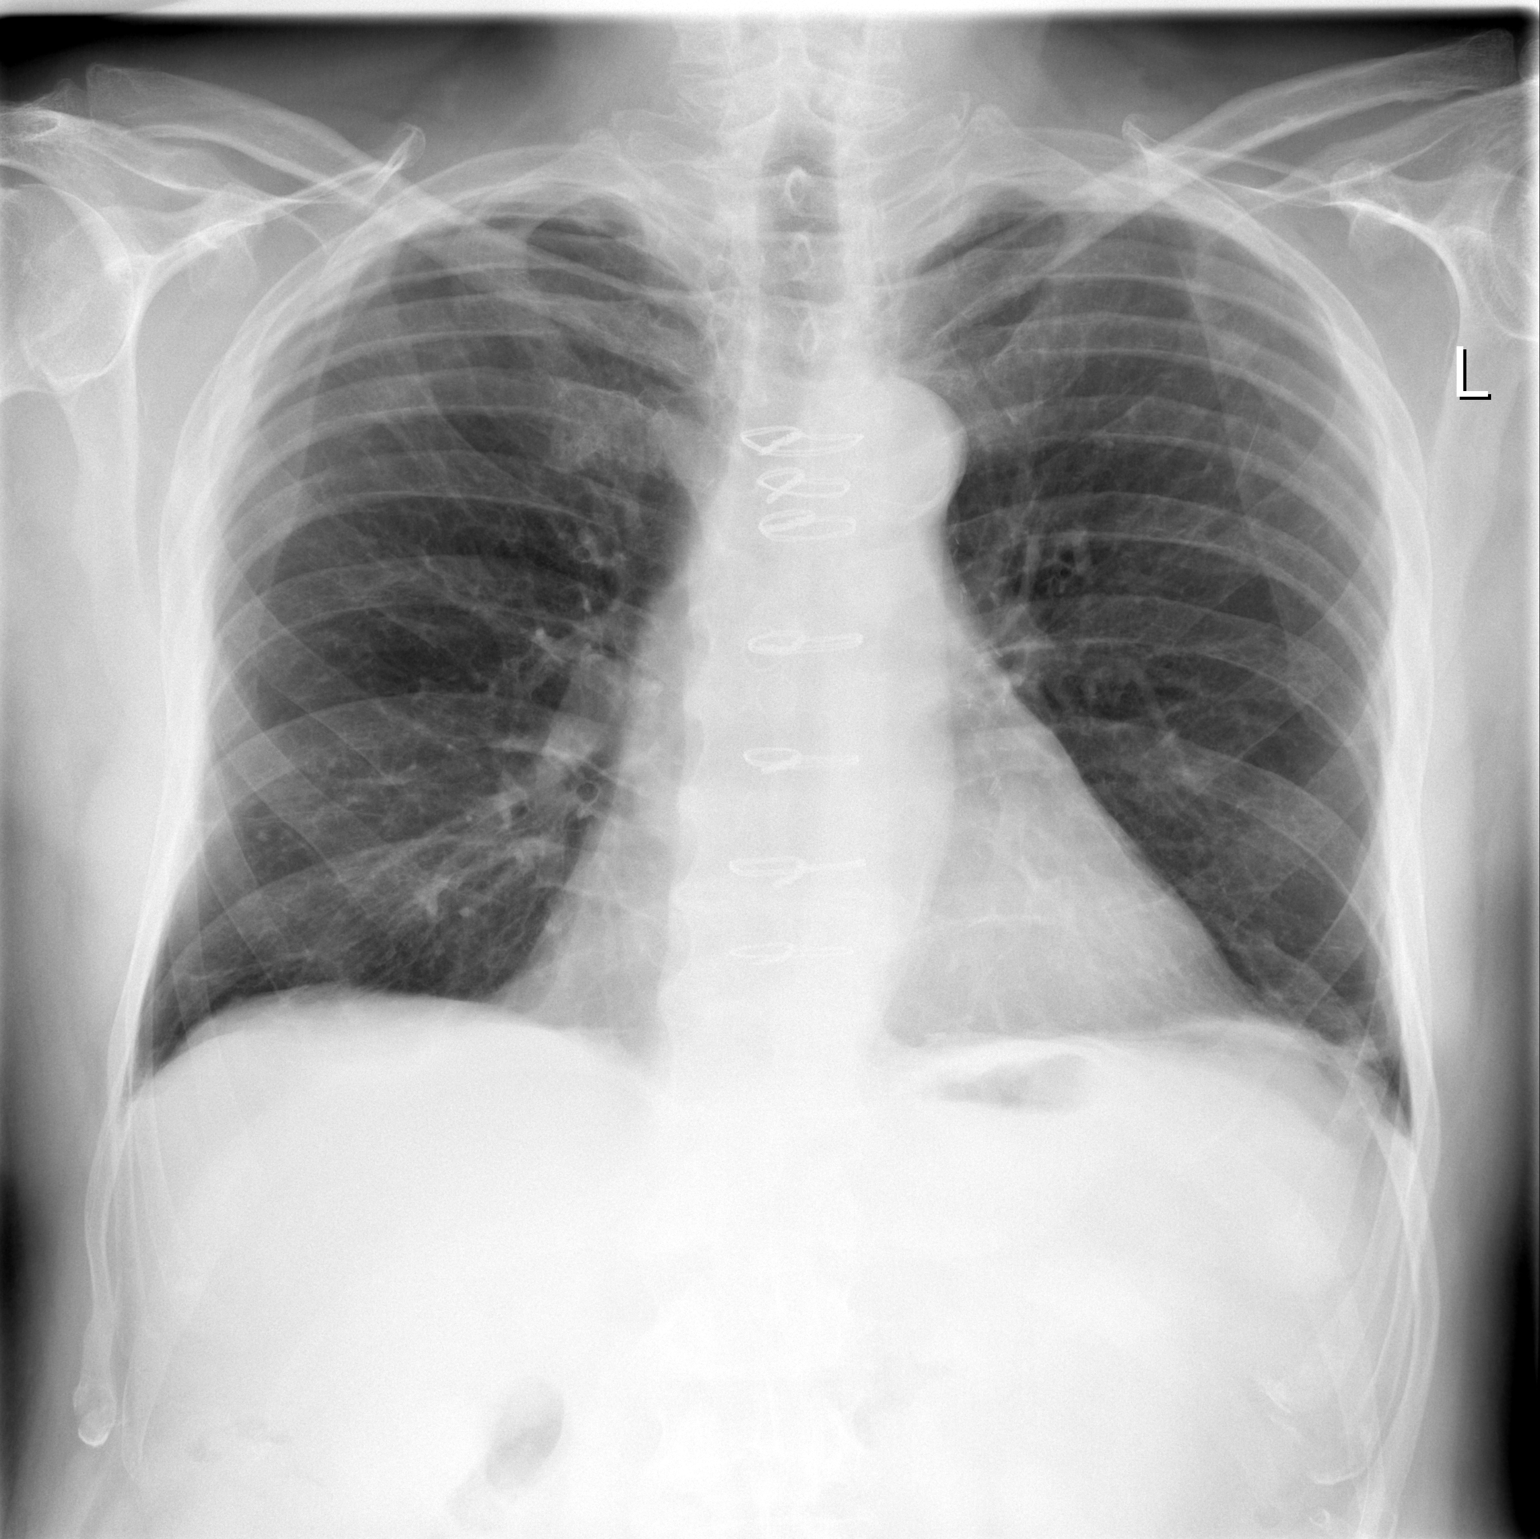

[w chest lat]
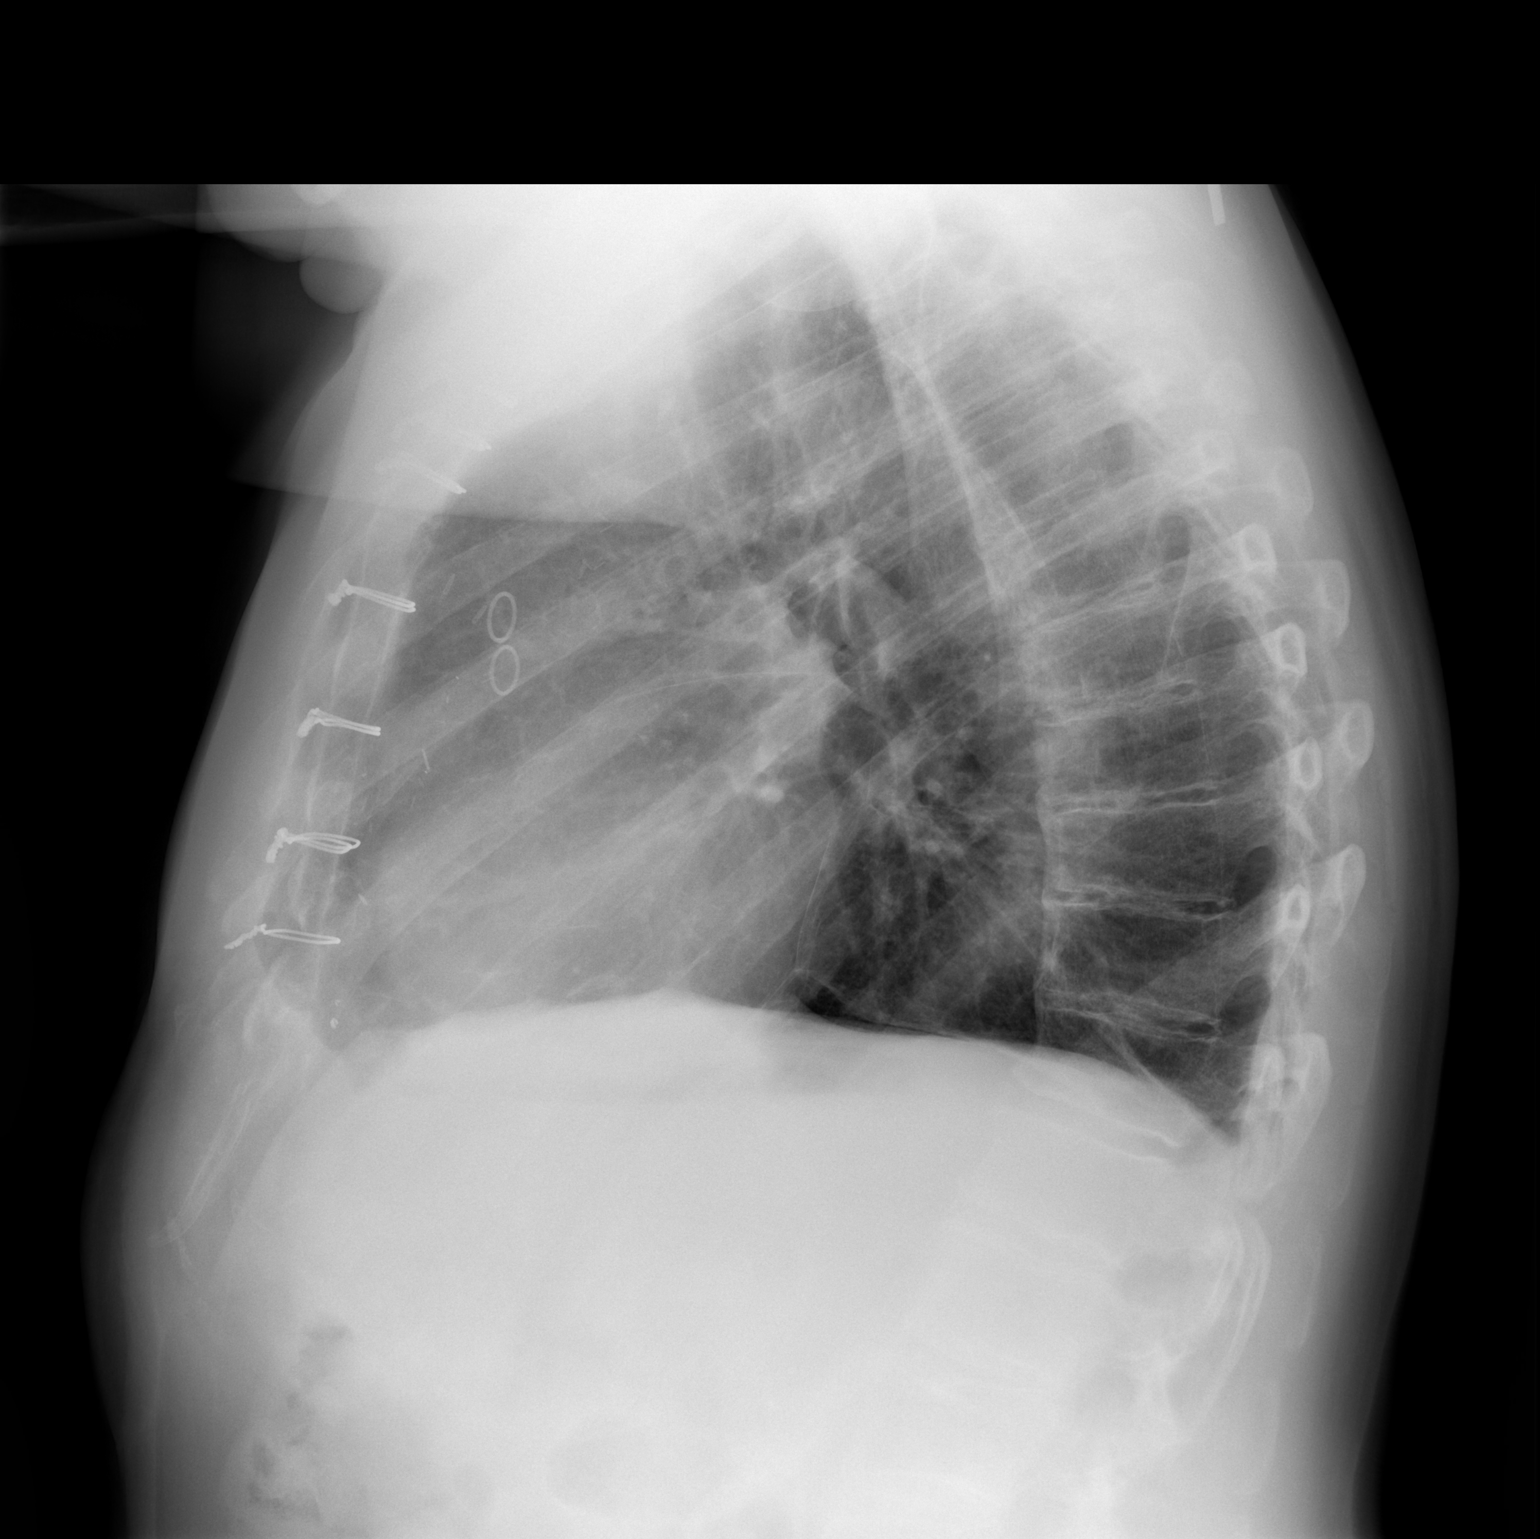

[2 of 2 positions shown; findings below may reference images not displayed]

FINDINGS: Trachea is midline.  Heart size stable.  Sternotomy wires
are unchanged in position.  There has been interval improvement in
bilateral pleural effusions and bibasilar atelectasis.  Biapical
pleural parenchymal scarring.  No pneumothorax.  Degenerative
changes are seen in the spine.
IMPRESSION: Improving bilateral pleural effusions and bibasilar atelectasis.

## 2011-01-05 ENCOUNTER — Encounter: Payer: Self-pay | Admitting: Cardiovascular Disease

## 2011-01-09 LAB — DIFFERENTIAL
Eosinophils Relative: 2
Lymphocytes Relative: 32
Lymphs Abs: 2.3
Monocytes Absolute: 0.9
Monocytes Relative: 13 — ABNORMAL HIGH

## 2011-01-09 LAB — CBC
HCT: 46.8
Hemoglobin: 15.7
RBC: 4.78
WBC: 7.2

## 2011-01-09 LAB — URINALYSIS, ROUTINE W REFLEX MICROSCOPIC
Glucose, UA: NEGATIVE
pH: 5.5

## 2011-01-09 LAB — BASIC METABOLIC PANEL
BUN: 16
Creatinine, Ser: 1.08
GFR calc non Af Amer: >60

## 2011-01-14 ENCOUNTER — Encounter: Payer: Self-pay | Admitting: Cardiovascular Disease

## 2011-01-14 ENCOUNTER — Ambulatory Visit (INDEPENDENT_AMBULATORY_CARE_PROVIDER_SITE_OTHER): Payer: Medicare Other | Admitting: Cardiovascular Disease

## 2011-01-14 DIAGNOSIS — I2510 Atherosclerotic heart disease of native coronary artery without angina pectoris: Secondary | ICD-10-CM

## 2011-01-14 DIAGNOSIS — I25810 Atherosclerosis of coronary artery bypass graft(s) without angina pectoris: Secondary | ICD-10-CM

## 2011-01-14 DIAGNOSIS — I701 Atherosclerosis of renal artery: Secondary | ICD-10-CM

## 2011-01-14 DIAGNOSIS — I10 Essential (primary) hypertension: Secondary | ICD-10-CM

## 2011-01-14 LAB — HOMOCYSTEINE: Homocysteine: 7

## 2011-01-14 LAB — COMPREHENSIVE METABOLIC PANEL
ALT: 27
AST: 29
Albumin: 3.9
CO2: 27
Calcium: 9.2
GFR calc Af Amer: >60
GFR calc non Af Amer: >60
Sodium: 137

## 2011-01-14 LAB — POCT I-STAT, CHEM 8
BUN: 16
Calcium, Ion: 1.16
Chloride: 103
Creatinine, Ser: 1.3
Glucose, Bld: 106 — ABNORMAL HIGH
HCT: 50
Potassium: 4.2

## 2011-01-14 LAB — PROTEIN S ACTIVITY: Protein S Activity: 82 % (ref 69–129)

## 2011-01-14 LAB — PROTIME-INR
INR: 1.1
INR: 1.5
INR: 1.6 — ABNORMAL HIGH
Prothrombin Time: 13.2
Prothrombin Time: 16.8 — ABNORMAL HIGH
Prothrombin Time: 18.3 — ABNORMAL HIGH
Prothrombin Time: 20.1 — ABNORMAL HIGH

## 2011-01-14 LAB — BASIC METABOLIC PANEL
BUN: 13
BUN: 17
Calcium: 8.5
Calcium: 8.8
Chloride: 106
Chloride: 106
Creatinine, Ser: 1.18
Creatinine, Ser: 1.22
Creatinine, Ser: 1.33
GFR calc Af Amer: >60
GFR calc non Af Amer: 52 — ABNORMAL LOW
GFR calc non Af Amer: 58 — ABNORMAL LOW
GFR calc non Af Amer: >60

## 2011-01-14 LAB — LIPID PANEL
Cholesterol: 224 — ABNORMAL HIGH
HDL: 32 — ABNORMAL LOW
LDL Cholesterol: 156 — ABNORMAL HIGH
Triglycerides: 181 — ABNORMAL HIGH

## 2011-01-14 LAB — DIFFERENTIAL
Eosinophils Relative: 1
Lymphocytes Relative: 20
Lymphs Abs: 2.1
Monocytes Absolute: 1.3 — ABNORMAL HIGH
Neutro Abs: 7.3

## 2011-01-14 LAB — LUPUS ANTICOAGULANT PANEL
Lupus Anticoagulant: DETECTED — AB
PTTLA 4:1 Mix: 74.3 — ABNORMAL HIGH (ref 36.3–48.8)
PTTLA Confirmation: 30.9 — ABNORMAL HIGH (ref <8.0)

## 2011-01-14 LAB — CK TOTAL AND CKMB (NOT AT ARMC)
CK, MB: 1.6
CK, MB: 1.7
Total CK: 54
Total CK: 81

## 2011-01-14 LAB — CBC
HCT: 48.2
Hemoglobin: 16
MCV: 96.8
MCV: 97.8
Platelets: 169
Platelets: 187
Platelets: 195
Platelets: 236
RBC: 3.95 — ABNORMAL LOW
WBC: 10
WBC: 10.9 — ABNORMAL HIGH
WBC: 7.8
WBC: 8.9
WBC: 9.6

## 2011-01-14 LAB — HEPATIC FUNCTION PANEL
ALT: 33
Bilirubin, Direct: 0.2
Indirect Bilirubin: 0.8
Total Protein: 6.6

## 2011-01-14 LAB — PROTEIN C, TOTAL: Protein C, Total: 103 % (ref 70–140)

## 2011-01-14 LAB — OCCULT BLOOD X 1 CARD TO LAB, STOOL: Fecal Occult Bld: NEGATIVE

## 2011-01-14 LAB — TSH: TSH: 1.998

## 2011-01-14 LAB — POCT CARDIAC MARKERS: Troponin i, poc: <0.05

## 2011-01-14 LAB — APTT: aPTT: 29

## 2011-01-14 LAB — BETA-2-GLYCOPROTEIN I ABS, IGG/M/A
Beta-2 Glyco I IgG: 5 U/mL (ref <20)
Beta-2-Glycoprotein I IgM: <4 U/mL (ref <10)

## 2011-01-14 LAB — FACTOR 5 LEIDEN

## 2011-01-14 LAB — ANTITHROMBIN III: AntiThromb III Func: 127 — ABNORMAL HIGH (ref 76–126)

## 2011-01-14 MED ORDER — METOPROLOL SUCCINATE ER 25 MG PO TB24: 25.0000 mg | ORAL_TABLET | Freq: Every day | ORAL | Status: DC

## 2011-01-14 NOTE — Assessment & Plan Note (Signed)
The patient is stable with patency of his renal stent demonstrated by ultrasound in June. This will be repeated in December and if stable will followup one year intervals. His chronic kidney disease is followed by Dr. Darrick Penna and the patient recently saw him last week.

## 2011-01-14 NOTE — Assessment & Plan Note (Signed)
The patient is stable following coronary bypass surgery last year. He continues on antiplatelet therapy with aspirin and lipid-lowering with rosuvastatin.

## 2011-01-14 NOTE — Progress Notes (Signed)
HPI:  [name] is a 77 year old gentleman presenting for followup evaluation. He has coronary artery disease status post coronary bypass surgery in 2011. He underwent emergency bypass after presenting with unstable angina and refractory symptoms with left main disease and multivessel disease. He initially had depressed LV function but this fully recovered following revascularization. At the time of his cardiac catheterization he was noted to have severe right renal artery stenosis. The patient had continued problems with hypertension and chronic kidney disease and he was referred for renal artery stenting which was performed in April of this year. He had a followup renal arterial duplex in June which showed a widely patent renal stents and he will be due for a followup renal duplex scan in December.  Overall the patient feels well. His energy level is only fair. He denies exertional chest pain, dyspnea, or chest pressure. He denies edema or claudication symptoms. He notes his blood pressure has been elevated a recent office visits.  Outpatient Encounter Prescriptions as of 01/14/2011  Medication Sig Dispense Refill  . aspirin 81 MG tablet Take 81 mg by mouth daily.        Marland Kitchen levothyroxine (SYNTHROID, LEVOTHROID) 125 MCG tablet Take 125 mcg by mouth daily.        . Multiple Vitamins-Minerals (CENTRUM SILVER PO) Take 1 tablet by mouth daily.        . Omega-3 Fatty Acids (FISH OIL) 1000 MG CAPS Take 2 capsules by mouth daily.        . rosuvastatin (CRESTOR) 10 MG tablet Take 1 tablet (10 mg total) by mouth at bedtime.  30 tablet  11  . Tamsulosin HCl (FLOMAX) 0.4 MG CAPS Take 0.4 mg by mouth daily.        Marland Kitchen DISCONTD: benazepril (LOTENSIN) 10 MG tablet Take 10 mg by mouth daily.        Marland Kitchen DISCONTD: clopidogrel (PLAVIX) 75 MG tablet Take 1 tablet (75 mg total) by mouth daily.  30 tablet  0  . DISCONTD: metoprolol (TOPROL-XL) 100 MG 24 hr tablet Take 100 mg by mouth daily.          No Known  Allergies  Past Medical History  Diagnosis Date  . Renal artery stenosis   . Hypertension   . Hyperlipidemia   . Pulmonary embolism 2009    hx of  . DJD (degenerative joint disease)   . Arrhythmia     PAROXYSMAL ATRIAL FIBRILLATION  . Coronary artery disease 8/11    CABG...LM EMERGENT WITH IABP  . RBBB (right bundle branch block)   . Unstable angina   . Arthritis     OSTEOARTHRITIS  . Anemia   . Thrombocytopenia   . History of BPH   . Thyroid disease     HYPOTHYROIDISM    ROS: Negative except as per HPI  BP 146/74  Pulse 97  Ht 6\' 3"  (1.905 m)  Wt 228 lb (103.42 kg)  BMI 28.50 kg/m2  PHYSICAL EXAM: Pt is alert and oriented, NAD HEENT: normal Neck: JVP - normal, carotids 2+= without bruits Lungs: CTA bilaterally CV: RRR without murmur or gallop Abd: soft, NT, Positive BS, no hepatomegaly Ext: no C/C/E, distal pulses intact and equal Skin: warm/dry no rash  EKG:  Sinus rhythm 100 beats per minute, rare PVC, right bundle branch block.  ASSESSMENT AND PLAN:

## 2011-01-14 NOTE — Assessment & Plan Note (Signed)
Blood pressure control is suboptimal. The patient has resting tachycardia and he has tolerated beta blockers in the past. I'm not sure why he is no longer on a beta blocker as I reviewed his previous office notes and there is no record of an intolerance.  Will start metoprolol succinate 25 mg daily.

## 2011-01-14 NOTE — Patient Instructions (Addendum)
Follow up in 6 months with Dr Excell Seltzer.  You will receive a letter in the mail 2 months before you are due.  Please call us when you receive this letter to schedule your follow up appointment.  Please start Metoprolol succinate (toprol) 25 mg a day Continue all other medications as listed.

## 2011-04-21 ENCOUNTER — Encounter: Payer: Self-pay | Admitting: Cardiovascular Disease

## 2011-04-26 ENCOUNTER — Encounter: Payer: Self-pay | Admitting: Cardiovascular Disease

## 2011-04-27 ENCOUNTER — Other Ambulatory Visit: Payer: Self-pay | Admitting: Cardiology

## 2011-04-27 DIAGNOSIS — I701 Atherosclerosis of renal artery: Secondary | ICD-10-CM

## 2011-04-29 ENCOUNTER — Encounter (INDEPENDENT_AMBULATORY_CARE_PROVIDER_SITE_OTHER): Payer: Medicare Other | Admitting: Cardiology

## 2011-04-29 DIAGNOSIS — I714 Abdominal aortic aneurysm, without rupture: Secondary | ICD-10-CM

## 2011-04-29 DIAGNOSIS — I701 Atherosclerosis of renal artery: Secondary | ICD-10-CM

## 2011-07-24 ENCOUNTER — Encounter: Payer: Self-pay | Admitting: Cardiovascular Disease

## 2011-07-24 ENCOUNTER — Ambulatory Visit (INDEPENDENT_AMBULATORY_CARE_PROVIDER_SITE_OTHER): Payer: Medicare Other | Admitting: Cardiovascular Disease

## 2011-07-24 VITALS — BP 130/76 | HR 71 | Ht 74.0 in | Wt 236.0 lb

## 2011-07-24 DIAGNOSIS — I10 Essential (primary) hypertension: Secondary | ICD-10-CM

## 2011-07-24 DIAGNOSIS — R0989 Other specified symptoms and signs involving the circulatory and respiratory systems: Secondary | ICD-10-CM

## 2011-07-24 DIAGNOSIS — I4891 Unspecified atrial fibrillation: Secondary | ICD-10-CM

## 2011-07-24 DIAGNOSIS — E782 Mixed hyperlipidemia: Secondary | ICD-10-CM

## 2011-07-24 DIAGNOSIS — I2510 Atherosclerotic heart disease of native coronary artery without angina pectoris: Secondary | ICD-10-CM

## 2011-07-24 DIAGNOSIS — I701 Atherosclerosis of renal artery: Secondary | ICD-10-CM

## 2011-07-24 NOTE — Assessment & Plan Note (Signed)
Well controlled on metoprolol.

## 2011-07-24 NOTE — Assessment & Plan Note (Signed)
The patient has a loud right carotid bruit. I've recommended a carotid duplex scan prior to his next office visit.

## 2011-07-24 NOTE — Assessment & Plan Note (Signed)
Last lipids were reviewed. Will repeat a lipid panel prior to his next office visit. He remains on Crestor.

## 2011-07-24 NOTE — Assessment & Plan Note (Signed)
The patient is stable without angina. He is about 2 years out from coronary bypass surgery. He'll continue with his same medical regimen which includes aspirin, a statin drug, and the beta blocker.

## 2011-07-24 NOTE — Progress Notes (Signed)
   HPI:  A 78 year old gentleman presenting for follow up evaluation. The patient has coronary artery disease and underwent CABG in 2011. The patient has renal atherosclerosis and underwent right renal artery stenting one year ago. His last renal arterial duplex was in January 2013 and this demonstrated a widely patent stent. 1 year followup is recommended. Last lab work from [facility] in January showed a creatinine of 1.4. Last lipid panel from one year ago demonstrated a total cholesterol of 171, triglycerides 252, HDL 41, and LDL of 100.  The patient has been feeling pretty well. He has chronic pain in his left foot related to old injury. He has screws in the ankle. The patient has no cardiopulmonary symptoms at present. He specifically denies chest pain or pressure. He denies dyspnea, edema, palpitations, lightheadedness, or syncope.  Outpatient Encounter Prescriptions as of 07/24/2011  Medication Sig Dispense Refill  . aspirin 81 MG tablet Take 81 mg by mouth daily.        Marland Kitchen levothyroxine (SYNTHROID, LEVOTHROID) 125 MCG tablet Take 125 mcg by mouth daily.        . metoprolol succinate (TOPROL XL) 25 MG 24 hr tablet Take 1 tablet (25 mg total) by mouth daily.  30 tablet  11  . Multiple Vitamins-Minerals (CENTRUM SILVER PO) Take 1 tablet by mouth daily.        . Omega-3 Fatty Acids (FISH OIL) 1000 MG CAPS Take 2 capsules by mouth daily.        . rosuvastatin (CRESTOR) 10 MG tablet Take 10 mg by mouth daily.      . Tamsulosin HCl (FLOMAX) 0.4 MG CAPS Take 0.4 mg by mouth daily.        . rosuvastatin (CRESTOR) 10 MG tablet Take 1 tablet (10 mg total) by mouth at bedtime.  30 tablet  11    No Known Allergies  Past Medical History  Diagnosis Date  . Renal artery stenosis   . Hypertension   . Hyperlipidemia   . Pulmonary embolism 2009    hx of  . DJD (degenerative joint disease)   . Arrhythmia     PAROXYSMAL ATRIAL FIBRILLATION  . Coronary artery disease 8/11    CABG...LM EMERGENT  WITH IABP  . RBBB (right bundle branch block)   . Unstable angina   . Arthritis     OSTEOARTHRITIS  . Anemia   . Thrombocytopenia   . History of BPH   . Thyroid disease     HYPOTHYROIDISM    ROS: Negative except as per HPI  BP 130/76  Pulse 71  Ht 6\' 2"  (1.88 m)  Wt 107.049 kg (236 lb)  BMI 30.30 kg/m2  PHYSICAL EXAM: Pt is alert and oriented, NAD HEENT: normal Neck: JVP - normal, carotids 2+= with bilateral bruits right greater than left Lungs: CTA bilaterally CV: RRR without murmur or gallop Abd: soft, NT, Positive BS, no hepatomegaly Ext: no C/C/E, distal pulses intact and equal Skin: warm/dry no rash  EKG:  Sinus rhythm with right bundle branch block.2  ASSESSMENT AND PLAN:

## 2011-07-24 NOTE — Assessment & Plan Note (Signed)
Renal arterial duplex from January was reviewed. The right renal artery remains widely patent. He'll have a followup in 1 year.

## 2011-07-24 NOTE — Patient Instructions (Addendum)
Your physician wants you to follow-up in: 6 MONTHS.  You will receive a reminder letter in the mail two months in advance. If you don't receive a letter, please call our office to schedule the follow-up appointment.  Your physician has requested that you have a carotid duplex in 6 MONTHS. This test is an ultrasound of the carotid arteries in your neck. It looks at blood flow through these arteries that supply the brain with blood. Allow one hour for this exam. There are no restrictions or special instructions.  Your physician recommends that you return for a FASTING LIPID and LIVER Profile in 6 MONTHS--nothing to eat or drink after midnight, lab opens at 8:30.  Your physician recommends that you continue on your current medications as directed. Please refer to the Current Medication list given to you today.

## 2012-01-25 ENCOUNTER — Encounter (INDEPENDENT_AMBULATORY_CARE_PROVIDER_SITE_OTHER): Payer: Medicare Other

## 2012-01-25 DIAGNOSIS — R0989 Other specified symptoms and signs involving the circulatory and respiratory systems: Secondary | ICD-10-CM

## 2012-01-25 DIAGNOSIS — E782 Mixed hyperlipidemia: Secondary | ICD-10-CM

## 2012-01-25 DIAGNOSIS — I6529 Occlusion and stenosis of unspecified carotid artery: Secondary | ICD-10-CM

## 2012-01-25 DIAGNOSIS — I2510 Atherosclerotic heart disease of native coronary artery without angina pectoris: Secondary | ICD-10-CM

## 2012-01-25 DIAGNOSIS — I10 Essential (primary) hypertension: Secondary | ICD-10-CM

## 2012-01-25 DIAGNOSIS — I701 Atherosclerosis of renal artery: Secondary | ICD-10-CM

## 2012-01-25 DIAGNOSIS — I4891 Unspecified atrial fibrillation: Secondary | ICD-10-CM

## 2012-01-26 ENCOUNTER — Other Ambulatory Visit (INDEPENDENT_AMBULATORY_CARE_PROVIDER_SITE_OTHER): Payer: Medicare Other

## 2012-01-26 ENCOUNTER — Encounter: Payer: Self-pay | Admitting: Cardiovascular Disease

## 2012-01-26 ENCOUNTER — Ambulatory Visit (INDEPENDENT_AMBULATORY_CARE_PROVIDER_SITE_OTHER): Payer: Medicare Other | Admitting: Cardiovascular Disease

## 2012-01-26 ENCOUNTER — Encounter: Payer: Self-pay | Admitting: Vascular Surgery

## 2012-01-26 ENCOUNTER — Ambulatory Visit (INDEPENDENT_AMBULATORY_CARE_PROVIDER_SITE_OTHER)
Admission: RE | Admit: 2012-01-26 | Discharge: 2012-01-26 | Disposition: A | Discharge status: Home / Self Care | Payer: Medicare Other | Source: Ambulatory Visit | Attending: Cardiovascular Disease | Admitting: Cardiovascular Disease

## 2012-01-26 VITALS — BP 140/88 | HR 111 | Ht 74.0 in | Wt 229.0 lb

## 2012-01-26 DIAGNOSIS — I6529 Occlusion and stenosis of unspecified carotid artery: Secondary | ICD-10-CM

## 2012-01-26 DIAGNOSIS — R0989 Other specified symptoms and signs involving the circulatory and respiratory systems: Secondary | ICD-10-CM

## 2012-01-26 DIAGNOSIS — I2510 Atherosclerotic heart disease of native coronary artery without angina pectoris: Secondary | ICD-10-CM

## 2012-01-26 LAB — BASIC METABOLIC PANEL
Chloride: 104 mEq/L (ref 96–112)
Potassium: 4.8 mEq/L (ref 3.5–5.1)
Sodium: 139 mEq/L (ref 135–145)

## 2012-01-26 MED ORDER — IOHEXOL 350 MG/ML SOLN
100.0000 mL | Freq: Once | INTRAVENOUS | Status: AC | PRN
  Administered 2012-01-26: 100 mL via INTRAVENOUS

## 2012-01-26 NOTE — Progress Notes (Signed)
HPI:  78 year old gentleman presenting for followup evaluation. The patient has coronary artery disease status post CABG 2011. He also is followed for renal atherosclerosis and underwent right renal artery stenting in 2012. When I saw him last in April of 2013 a carotid bruit was noted. A carotid duplex performed yesterday showed 80-99% bilateral carotid artery stenosis.  The patient has chronic gait instability and dizziness. He's had some blurry vision but this improved with a change in his glasses. He had no associated he denies any episodes of a fascia, numbness, tingling, or weakness of his extremities. The patient does have chronic dyspnea with low-level exertion. He's had no edema, orthopnea, PND, or chest pain.owever, last night he had an episode where he lost vision in the right eye for approximately 5 minutes. His vision slowly returned to normal. He has no residual visual complaints other than his chronic blurry vision.  Outpatient Encounter Prescriptions as of 01/26/2012  Medication Sig Dispense Refill  . aspirin 81 MG tablet Take 81 mg by mouth daily.        Marland Kitchen levothyroxine (SYNTHROID, LEVOTHROID) 125 MCG tablet Take 125 mcg by mouth daily.        . metoprolol succinate (TOPROL XL) 25 MG 24 hr tablet Take 1 tablet (25 mg total) by mouth daily.  30 tablet  11  . Multiple Vitamins-Minerals (CENTRUM SILVER PO) Take 1 tablet by mouth daily.        . Omega-3 Fatty Acids (FISH OIL) 1000 MG CAPS Take 2 capsules by mouth daily.        . rosuvastatin (CRESTOR) 10 MG tablet Take 1 tablet (10 mg total) by mouth at bedtime.  30 tablet  11  . Tamsulosin HCl (FLOMAX) 0.4 MG CAPS Take 0.4 mg by mouth daily.        Marland Kitchen DISCONTD: rosuvastatin (CRESTOR) 10 MG tablet Take 10 mg by mouth daily.        No Known Allergies  Past Medical History  Diagnosis Date  . Renal artery stenosis   . Hypertension   . Hyperlipidemia   . Pulmonary embolism 2009    hx of  . DJD (degenerative joint disease)   .  Arrhythmia     PAROXYSMAL ATRIAL FIBRILLATION  . Coronary artery disease 8/11    CABG...LM EMERGENT WITH IABP  . RBBB (right bundle branch block)   . Unstable angina   . Arthritis     OSTEOARTHRITIS  . Anemia   . Thrombocytopenia   . History of BPH   . Thyroid disease     HYPOTHYROIDISM    ROS: Negative except as per HPI  BP 140/88  Pulse 111  Ht 6\' 2"  (1.88 m)  Wt 103.874 kg (229 lb)  BMI 29.40 kg/m2  SpO2 94%  PHYSICAL EXAM: Pt is alert and oriented, NAD HEENT: normal Neck: JVP - normal, carotids 2+= with bilateral bruits Lungs: CTA bilaterally CV: RRR without murmur or gallop Abd: soft, NT, Positive BS, no hepatomegaly Ext: no C/C/E, distal pulses intact and equal Skin: warm/dry no rash  EKG:  sinus rhythm with PACs, right bundle branch block.  ASSESSMENT AND PLAN:  1. Severe bilateral carotid stenosis. The patient had an isolated episode of amaurosis fugax yesterday. I discussed his case with Dr. Arbie Cookey. Will plan a CT angiogram of the carotid arteries and arrange vascular surgery evaluation this week. I have educated the patient and his wife about symptoms of stroke or TIA and they understand to call 911 if symptoms  recur. The patient will continue on his current medical program which includes aspirin for antiplatelet therapy and Crestor for lipid lowering.  2. CAD status post CABG. The patient has no symptoms of angina. Since he was revascularized within 5 years and has no anginal symptoms, he can proceed with anticipated carotid artery surgery without further cardiac evaluation.  3. Renal atherosclerosis status post renal artery stenting. Will check a metabolic panel. His blood pressure remains well-controlled.  4. Hypertension. Well controlled on current program.  As above, the patient will have a CT angiogram of his carotid arteries in close followup with vascular surgery. I will see him back in scheduled followup in 6 months.  Tonny Bollman 01/26/2012 11:29  AM

## 2012-01-26 NOTE — Patient Instructions (Addendum)
Your physician recommends that you have lab work today    You have been referred to Vascular Surgery this week appointment to be scheduled    Need to schedule CT Angiogram of neck this week

## 2012-01-27 ENCOUNTER — Encounter (HOSPITAL_COMMUNITY)
Admission: RE | Admit: 2012-01-27 | Discharge: 2012-01-27 | Disposition: A | Discharge status: Home / Self Care | Payer: Medicare Other | Source: Ambulatory Visit | Attending: Vascular Surgery | Admitting: Vascular Surgery

## 2012-01-27 ENCOUNTER — Encounter (HOSPITAL_COMMUNITY): Payer: Self-pay | Admitting: Pharmacy Technician

## 2012-01-27 ENCOUNTER — Ambulatory Visit (HOSPITAL_COMMUNITY)
Admission: RE | Admit: 2012-01-27 | Discharge: 2012-01-27 | Disposition: A | Discharge status: Home / Self Care | Payer: Medicare Other | Source: Ambulatory Visit | Attending: Vascular Surgery | Admitting: Vascular Surgery

## 2012-01-27 ENCOUNTER — Other Ambulatory Visit: Payer: Self-pay

## 2012-01-27 ENCOUNTER — Encounter (HOSPITAL_COMMUNITY): Payer: Self-pay

## 2012-01-27 ENCOUNTER — Encounter: Payer: Self-pay | Admitting: Vascular Surgery

## 2012-01-27 ENCOUNTER — Ambulatory Visit (INDEPENDENT_AMBULATORY_CARE_PROVIDER_SITE_OTHER): Payer: Medicare Other | Admitting: Vascular Surgery

## 2012-01-27 VITALS — BP 129/72 | HR 88 | Resp 16 | Ht 74.0 in | Wt 229.6 lb

## 2012-01-27 DIAGNOSIS — Z01812 Encounter for preprocedural laboratory examination: Secondary | ICD-10-CM | POA: Insufficient documentation

## 2012-01-27 DIAGNOSIS — I6529 Occlusion and stenosis of unspecified carotid artery: Secondary | ICD-10-CM

## 2012-01-27 DIAGNOSIS — I779 Disorder of arteries and arterioles, unspecified: Secondary | ICD-10-CM | POA: Insufficient documentation

## 2012-01-27 DIAGNOSIS — I10 Essential (primary) hypertension: Secondary | ICD-10-CM | POA: Insufficient documentation

## 2012-01-27 DIAGNOSIS — Z01818 Encounter for other preprocedural examination: Secondary | ICD-10-CM | POA: Insufficient documentation

## 2012-01-27 HISTORY — DX: Dorsalgia, unspecified: M54.9

## 2012-01-27 HISTORY — DX: Other chronic pain: G89.29

## 2012-01-27 HISTORY — DX: Unspecified cataract: H26.9

## 2012-01-27 LAB — CBC
HCT: 48.1 % (ref 39.0–52.0)
MCHC: 33.7 g/dL (ref 30.0–36.0)
RDW: 12.5 % (ref 11.5–15.5)

## 2012-01-27 LAB — TYPE AND SCREEN
ABO/RH(D): A POS
Antibody Screen: NEGATIVE

## 2012-01-27 LAB — SURGICAL PCR SCREEN: Staphylococcus aureus: NEGATIVE

## 2012-01-27 LAB — URINALYSIS, ROUTINE W REFLEX MICROSCOPIC
Leukocytes, UA: NEGATIVE
Nitrite: NEGATIVE
Specific Gravity, Urine: 1.03 (ref 1.005–1.030)
Urobilinogen, UA: 0.2 mg/dL (ref 0.0–1.0)
pH: 5.5 (ref 5.0–8.0)

## 2012-01-27 LAB — PROTIME-INR: INR: 1.01 (ref 0.00–1.49)

## 2012-01-27 LAB — COMPREHENSIVE METABOLIC PANEL
Albumin: 3.7 g/dL (ref 3.5–5.2)
Alkaline Phosphatase: 69 U/L (ref 39–117)
BUN: 19 mg/dL (ref 6–23)
Calcium: 9.5 mg/dL (ref 8.4–10.5)
Potassium: 4.8 mEq/L (ref 3.5–5.1)
Total Protein: 7.2 g/dL (ref 6.0–8.3)

## 2012-01-27 LAB — APTT: aPTT: 28 seconds (ref 24–37)

## 2012-01-27 MED ORDER — SODIUM CHLORIDE 0.9 % IV SOLN: INTRAVENOUS | Status: DC

## 2012-01-27 MED ORDER — DEXTROSE 5 % IV SOLN
1.5000 g | INTRAVENOUS | Status: AC
  Administered 2012-01-28: 1.5 g via INTRAVENOUS
  Filled 2012-01-27: qty 1.5

## 2012-01-27 NOTE — Consult Note (Signed)
Anesthesia Chart Review:  Patient is a 78 year old male scheduled for a right CEA by Dr. Edilia Bo on 01/28/12 for symptomatic carotid artery stenosis (amaurosis fugax). He was recently found to have bilateral 80-99% ICA stenosis by duplex (75% RICAS, 70% LICAS by CTA 01/26/12).  Other hiistory includes former smoker, HTN, DVT/PE '09, HLD, anemia, BPH, CAD s/p CABG '11 (LIMA to LAD, SVG to D1, SVG to D2), thrombocytopenia, cataracts, renal artery stenosis s/p right stent '12, hypothyroidism, TKR, THR.  Cardiologist is Dr. Excell Seltzer.  He last saw Mr. Coate on 01/26/12 and felt he could proceed with carotid artery surgery without further cardiac evaluation.  EKG then showed NSR, PACs, right BBB.  Echo on 08/12/10 showed: - Left ventricle: The cavity size was normal. Wall thickness was increased in a pattern of moderate LVH. Systolic function was normal. The estimated ejection fraction was in the range of 55% to 60%. Wall motion was normal; there were no regional wall motion abnormalities. Doppler parameters are consistent with abnormal left ventricular relaxation (grade 1 diastolic dysfunction). - Left atrium: The atrium was mildly dilated. - Atrial septum: No defect or patent foramen ovale was identified. - Trivial mitral valve, pulmonic valve, and tricuspid valve regurgitation.  His last cardiac cath was on 11/17/09 prior to his emergent CABG.  Labs noted.  Cr 1.57 (1.3-1.6 since 07/16/10). PLT 186.  CXR on 01/27/12 showed bibasilar scarring, but no acute process.  Plan to proceed.  Shonna Chock, PA-C

## 2012-01-27 NOTE — Pre-Procedure Instructions (Signed)
[address] [place]  01/27/2012   Your procedure is scheduled on:  Thursday October 17,2013  Report to [facility] at 5:30 AM.  Call this number if you have problems the morning of surgery: [phone]   Remember:   Do not eat food or drink After Midnight.      Take these medicines the morning of surgery with A SIP OF WATER: levothyroxine, metoprolol, zantac,flomax   Do not wear jewelry, make-up or nail polish.  Do not wear lotions, powders, or perfumes.   Do not shave 48 hours prior to surgery. Men may shave face and neck.  Do not bring valuables to the hospital.  Contacts, dentures or bridgework may not be worn into surgery.  Leave suitcase in the car. After surgery it may be brought to your room.  For patients admitted to the hospital, checkout time is 11:00 AM the day of discharge.   Patients discharged the day of surgery will not be allowed to drive home.  Name and phone number of your driver: family / friend  Special Instructions: Shower using CHG 2 nights before surgery and the night before surgery.  If you shower the day of surgery use CHG.  Use special wash - you have one bottle of CHG for all showers.  You should use approximately 1/3 of the bottle for each shower.   Please read over the following fact sheets that you were given: Pain Booklet, Coughing and Deep Breathing, Blood Transfusion Information, MRSA Information and Surgical Site Infection Prevention

## 2012-01-27 NOTE — Progress Notes (Addendum)
Spoke with Revonda Standard regarding cardiac clearance with Dr. Excell Seltzer. Chart forwarded to anesthesia to review cardiac notes/labs.

## 2012-01-27 NOTE — Assessment & Plan Note (Signed)
This patient has a greater than 80% symptomatic right carotid stenosis. He had an episode of amaurosis fugax in his right eye 2 nights ago. He has an asymptomatic greater than 80% left carotid stenosis. I have recommended staged bilateral carotid endarterectomies. I would recommend that we start with the right side as this is symptomatic. I have reviewed the indications for carotid endarterectomy, that is to lower the risk of future stroke. I have also reviewed the potential complications of surgery, including but not limited to: bleeding, stroke (perioperative risk 1-2%), MI, nerve injury of other unpredictable medical problems. All of the patients questions were answered and they are agreeable to proceed with surgery. He is on aspirin and is to continue his aspirin right up through surgery. He is scheduled for right carotid endarterectomy tomorrow, 01/28/2012. We would likely proceed with staged left carotid endarterectomy in 4-6 weeks.

## 2012-01-27 NOTE — Progress Notes (Signed)
[facility] [place]  Patient name: Leonard Berg MRN: [id number] DOB: 05/08/1929 Sex: male  REASON FOR CONSULT: severe bilateral carotid disease. Referred by Dr. Michael Berg.  HPI: Leonard Berg is a 78 y.o. male who was found have bilateral carotid bruits. This prompted a carotid duplex scan which showed bilateral greater than 80% right carotid stenoses. The patient is right-handed. He states that 2 nights ago he had an episode of transient loss of vision in his right eye which lasted for 5 minutes and then resolve completely. He denies any history of stroke, TIAs, expressive or receptive aphasia.   Past Medical History  Diagnosis Date  . Hypertension   . Hyperlipidemia   . Pulmonary embolism 2009    hx of  . DJD (degenerative joint disease)   . Arrhythmia     PAROXYSMAL ATRIAL FIBRILLATION  . Coronary artery disease 8/11    CABG...LM EMERGENT WITH IABP  . RBBB (right bundle branch block)   . Unstable angina   . Arthritis     OSTEOARTHRITIS  . Anemia   . Thrombocytopenia   . History of BPH   . Thyroid disease     HYPOTHYROIDISM  . DVT (deep venous thrombosis)   . Renal artery stenosis     Family History  Problem Relation Age of Onset  . Lung cancer Father 52    deceased   . Cancer Father 52    LUNG  . Stroke Mother 93    DECEASED   . Hyperlipidemia Mother   . Heart attack Brother 59    deceased  . Diabetes Brother   . Liver cancer Sister 63    deceased   . Cancer Sister     SOCIAL HISTORY: History  Substance Use Topics  . Smoking status: Former Smoker    Quit date: 04/13/1974  . Smokeless tobacco: Former User    Quit date: 01/26/2002   Comment: He has smoked about 27-pack-year hx   . Alcohol Use: 1.2 oz/week    2 Cans of beer per week     He used to drink more heavily , but rarely drinks at the current time    No Known Allergies  Current Outpatient Prescriptions  Medication Sig Dispense Refill  . aspirin 81 MG  tablet Take 81 mg by mouth daily.        . levothyroxine (SYNTHROID, LEVOTHROID) 125 MCG tablet Take 125 mcg by mouth daily.        . metoprolol succinate (TOPROL XL) 25 MG 24 hr tablet Take 1 tablet (25 mg total) by mouth daily.  30 tablet  11  . Multiple Vitamins-Minerals (CENTRUM SILVER PO) Take 1 tablet by mouth daily.        . Omega-3 Fatty Acids (FISH OIL) 1000 MG CAPS Take 2 capsules by mouth daily.        . Ranitidine HCl (ZANTAC 75 PO) Take by mouth daily.      . rosuvastatin (CRESTOR) 10 MG tablet Take 1 tablet (10 mg total) by mouth at bedtime.  30 tablet  11  . Tamsulosin HCl (FLOMAX) 0.4 MG CAPS Take 0.4 mg by mouth daily.         No current facility-administered medications for this visit.   Facility-Administered Medications Ordered in Other Visits  Medication Dose Route Frequency Provider Last Rate Last Dose  . iohexol (OMNIPAQUE) 350 MG/ML injection 100 mL  100 mL Intravenous Once PRN Medication Radiologist, MD   100 mL at   01/26/12 1616    REVIEW OF SYSTEMS: [X ] denotes positive finding; [  ] denotes negative finding  CARDIOVASCULAR:  [ ] chest pain   [ ] chest pressure   [ ] palpitations   [ ] orthopnea   [X ] dyspnea on exertion   [ ] claudication   [ ] rest pain   [X ] DVT   [ ] phlebitis PULMONARY:   [ ] productive cough   [ ] asthma   [ ] wheezing NEUROLOGIC:   [ ] weakness  [ ] paresthesias  [ ] aphasia  [X ] amaurosis - Right I  [ ] dizziness HEMATOLOGIC:   [ ] bleeding problems   [ ] clotting disorders MUSCULOSKELETAL:  [ ] joint pain   [ ] joint swelling [ ] leg swelling GASTROINTESTINAL: [ ]  blood in stool  [ ]  hematemesis GENITOURINARY:  [ ]  dysuria  [ ]  hematuria PSYCHIATRIC:  [ ] history of major depression INTEGUMENTARY:  [ ] rashes  [ ] ulcers CONSTITUTIONAL:  [ ] fever   [ ] chills  PHYSICAL EXAM: Filed Vitals:   01/27/12 1053 01/27/12 1056  BP: 127/79 129/72  Pulse: 88 88  Resp: 16 16  Height: 6' 2" (1.88 m)   Weight: 229 lb 9.6 oz (104.146  kg)   SpO2: 95%    Body mass index is 29.48 kg/(m^2). GENERAL: The patient is a well-nourished male, in no acute distress. The vital signs are documented above. CARDIOVASCULAR: There is a regular rate and rhythm. He has bilateral carotid bruits. I cannot palpate pedal pulses although both feet are warm. He has mild bilateral lower extremity swelling. PULMONARY: There is good air exchange bilaterally without wheezing or rales. ABDOMEN: Soft and non-tender with normal pitched bowel sounds. I cannot palpate an abdominal aortic aneurysm. MUSCULOSKELETAL: There are no major deformities or cyanosis. NEUROLOGIC: No focal weakness or paresthesias are detected. SKIN: There are no ulcers or rashes noted. PSYCHIATRIC: The patient has a normal affect.  DATA:  I have reviewed his carotid duplex scan which shows bilateral greater than 80% carotid stenoses. No seizure severely calcified and extend up into the internal carotid arteries about 2 cm.  I also reviewed his CT angiography neck which shows severe bilateral carotid stenoses. Both carotid stenoses appear to be surgically accessible.  MEDICAL ISSUES:  Occlusion and stenosis of carotid artery without mention of cerebral infarction This patient has a greater than 80% symptomatic right carotid stenosis. He had an episode of amaurosis fugax in his right eye 2 nights ago. He has an asymptomatic greater than 80% left carotid stenosis. I have recommended staged bilateral carotid endarterectomies. I would recommend that we start with the right side as this is symptomatic. I have reviewed the indications for carotid endarterectomy, that is to lower the risk of future stroke. I have also reviewed the potential complications of surgery, including but not limited to: bleeding, stroke (perioperative risk 1-2%), MI, nerve injury of other unpredictable medical problems. All of the patients questions were answered and they are agreeable to proceed with surgery. He is on  aspirin and is to continue his aspirin right up through surgery. He is scheduled for right carotid endarterectomy tomorrow, 01/28/2012. We would likely proceed with staged left carotid endarterectomy in 4-6 weeks.     Leonard Berg S [facility] [place] Beeper: [phone]    

## 2012-01-28 ENCOUNTER — Telehealth: Payer: Self-pay | Admitting: Vascular Surgery

## 2012-01-28 ENCOUNTER — Encounter (HOSPITAL_COMMUNITY): Payer: Self-pay | Admitting: Vascular Surgery

## 2012-01-28 ENCOUNTER — Encounter (HOSPITAL_COMMUNITY)
Admission: RE | Disposition: A | Discharge status: Home / Self Care | Payer: Self-pay | Source: Ambulatory Visit | Attending: Vascular Surgery

## 2012-01-28 ENCOUNTER — Encounter (HOSPITAL_COMMUNITY): Payer: Self-pay | Admitting: *Deleted

## 2012-01-28 ENCOUNTER — Inpatient Hospital Stay (HOSPITAL_COMMUNITY)
Admission: RE | Admit: 2012-01-28 | Discharge: 2012-01-29 | DRG: 254 | Disposition: A | Discharge status: Home / Self Care | Payer: Medicare Other | Source: Ambulatory Visit | Attending: Vascular Surgery | Admitting: Vascular Surgery

## 2012-01-28 ENCOUNTER — Ambulatory Visit (HOSPITAL_COMMUNITY): Payer: Medicare Other | Admitting: Vascular Surgery

## 2012-01-28 DIAGNOSIS — I701 Atherosclerosis of renal artery: Secondary | ICD-10-CM | POA: Diagnosis present

## 2012-01-28 DIAGNOSIS — Z823 Family history of stroke: Secondary | ICD-10-CM

## 2012-01-28 DIAGNOSIS — H269 Unspecified cataract: Secondary | ICD-10-CM | POA: Diagnosis present

## 2012-01-28 DIAGNOSIS — I6529 Occlusion and stenosis of unspecified carotid artery: Secondary | ICD-10-CM

## 2012-01-28 DIAGNOSIS — N400 Enlarged prostate without lower urinary tract symptoms: Secondary | ICD-10-CM | POA: Diagnosis present

## 2012-01-28 DIAGNOSIS — I10 Essential (primary) hypertension: Principal | ICD-10-CM | POA: Diagnosis present

## 2012-01-28 DIAGNOSIS — I658 Occlusion and stenosis of other precerebral arteries: Secondary | ICD-10-CM | POA: Diagnosis present

## 2012-01-28 DIAGNOSIS — M1990 Unspecified osteoarthritis, unspecified site: Secondary | ICD-10-CM | POA: Diagnosis present

## 2012-01-28 DIAGNOSIS — Z7982 Long term (current) use of aspirin: Secondary | ICD-10-CM

## 2012-01-28 DIAGNOSIS — Z87891 Personal history of nicotine dependence: Secondary | ICD-10-CM

## 2012-01-28 DIAGNOSIS — Z86718 Personal history of other venous thrombosis and embolism: Secondary | ICD-10-CM

## 2012-01-28 DIAGNOSIS — Z86711 Personal history of pulmonary embolism: Secondary | ICD-10-CM

## 2012-01-28 DIAGNOSIS — Z951 Presence of aortocoronary bypass graft: Secondary | ICD-10-CM

## 2012-01-28 DIAGNOSIS — Z23 Encounter for immunization: Secondary | ICD-10-CM

## 2012-01-28 DIAGNOSIS — Z79899 Other long term (current) drug therapy: Secondary | ICD-10-CM

## 2012-01-28 DIAGNOSIS — G8929 Other chronic pain: Secondary | ICD-10-CM | POA: Diagnosis present

## 2012-01-28 DIAGNOSIS — E785 Hyperlipidemia, unspecified: Secondary | ICD-10-CM | POA: Diagnosis present

## 2012-01-28 DIAGNOSIS — I4510 Unspecified right bundle-branch block: Secondary | ICD-10-CM | POA: Diagnosis present

## 2012-01-28 DIAGNOSIS — M549 Dorsalgia, unspecified: Secondary | ICD-10-CM | POA: Diagnosis present

## 2012-01-28 DIAGNOSIS — Z8249 Family history of ischemic heart disease and other diseases of the circulatory system: Secondary | ICD-10-CM

## 2012-01-28 DIAGNOSIS — I2510 Atherosclerotic heart disease of native coronary artery without angina pectoris: Secondary | ICD-10-CM | POA: Diagnosis present

## 2012-01-28 HISTORY — PX: CAROTID ENDARTERECTOMY: SUR193

## 2012-01-28 HISTORY — PX: ENDARTERECTOMY: SHX5162

## 2012-01-28 SURGERY — ENDARTERECTOMY, CAROTID
Anesthesia: General | Site: Neck | Laterality: Right | Wound class: Clean

## 2012-01-28 MED ORDER — OXYCODONE-ACETAMINOPHEN 5-325 MG PO TABS: 1.0000 | ORAL_TABLET | ORAL | Status: DC | PRN

## 2012-01-28 MED ORDER — ONDANSETRON HCL 4 MG/2ML IJ SOLN: 4.0000 mg | Freq: Once | INTRAMUSCULAR | Status: DC | PRN

## 2012-01-28 MED ORDER — ASPIRIN EC 81 MG PO TBEC
81.0000 mg | DELAYED_RELEASE_TABLET | Freq: Every day | ORAL | Status: DC
  Administered 2012-01-29: 81 mg via ORAL
  Filled 2012-01-28: qty 1

## 2012-01-28 MED ORDER — PNEUMOCOCCAL VAC POLYVALENT 25 MCG/0.5ML IJ INJ
0.5000 mL | INJECTION | INTRAMUSCULAR | Status: AC
  Administered 2012-01-29: 0.5 mL via INTRAMUSCULAR
  Filled 2012-01-28: qty 0.5

## 2012-01-28 MED ORDER — 0.9 % SODIUM CHLORIDE (POUR BTL) OPTIME
TOPICAL | Status: DC | PRN
  Administered 2012-01-28: 1000 mL

## 2012-01-28 MED ORDER — MIDAZOLAM HCL 5 MG/5ML IJ SOLN
INTRAMUSCULAR | Status: DC | PRN
  Administered 2012-01-28: 1 mg via INTRAVENOUS

## 2012-01-28 MED ORDER — LIDOCAINE HCL (CARDIAC) 20 MG/ML IV SOLN
INTRAVENOUS | Status: DC | PRN
  Administered 2012-01-28: 100 mg via INTRAVENOUS

## 2012-01-28 MED ORDER — PHENYLEPHRINE HCL 10 MG/ML IJ SOLN
INTRAMUSCULAR | Status: DC | PRN
  Administered 2012-01-28: 80 ug via INTRAVENOUS
  Administered 2012-01-28: 120 ug via INTRAVENOUS
  Administered 2012-01-28: 80 ug via INTRAVENOUS
  Administered 2012-01-28: 160 ug via INTRAVENOUS

## 2012-01-28 MED ORDER — ACETAMINOPHEN 325 MG PO TABS
325.0000 mg | ORAL_TABLET | ORAL | Status: DC | PRN
  Administered 2012-01-29: 325 mg via ORAL
  Filled 2012-01-28: qty 2

## 2012-01-28 MED ORDER — METOPROLOL TARTRATE 1 MG/ML IV SOLN: 2.0000 mg | INTRAVENOUS | Status: DC | PRN

## 2012-01-28 MED ORDER — OXYCODONE HCL 5 MG PO TABS: 5.0000 mg | ORAL_TABLET | Freq: Once | ORAL | Status: DC | PRN

## 2012-01-28 MED ORDER — DOPAMINE-DEXTROSE 3.2-5 MG/ML-% IV SOLN: 3.0000 ug/kg/min | INTRAVENOUS | Status: DC | PRN

## 2012-01-28 MED ORDER — NEOSTIGMINE METHYLSULFATE 1 MG/ML IJ SOLN
INTRAMUSCULAR | Status: DC | PRN
  Administered 2012-01-28: 4 mg via INTRAVENOUS

## 2012-01-28 MED ORDER — ALUM & MAG HYDROXIDE-SIMETH 200-200-20 MG/5ML PO SUSP: 15.0000 mL | ORAL | Status: DC | PRN

## 2012-01-28 MED ORDER — POTASSIUM CHLORIDE CRYS ER 20 MEQ PO TBCR: 20.0000 meq | EXTENDED_RELEASE_TABLET | Freq: Every day | ORAL | Status: DC | PRN

## 2012-01-28 MED ORDER — DEXTROSE 5 % IV SOLN
1.5000 g | Freq: Two times a day (BID) | INTRAVENOUS | Status: AC
  Administered 2012-01-28 – 2012-01-29 (×2): 1.5 g via INTRAVENOUS
  Filled 2012-01-28 (×2): qty 1.5

## 2012-01-28 MED ORDER — SODIUM CHLORIDE 0.9 % IV SOLN
500.0000 mL | Freq: Once | INTRAVENOUS | Status: AC | PRN
  Administered 2012-01-28: 500 mL via INTRAVENOUS

## 2012-01-28 MED ORDER — ONDANSETRON HCL 4 MG/2ML IJ SOLN
4.0000 mg | Freq: Four times a day (QID) | INTRAMUSCULAR | Status: DC | PRN
  Administered 2012-01-28: 4 mg via INTRAVENOUS
  Filled 2012-01-28: qty 2

## 2012-01-28 MED ORDER — OXYCODONE HCL 5 MG/5ML PO SOLN: 5.0000 mg | Freq: Once | ORAL | Status: DC | PRN

## 2012-01-28 MED ORDER — ACETAMINOPHEN 650 MG RE SUPP
325.0000 mg | RECTAL | Status: DC | PRN
Start: 2012-01-28 — End: 2012-01-29

## 2012-01-28 MED ORDER — GUAIFENESIN-DM 100-10 MG/5ML PO SYRP: 15.0000 mL | ORAL_SOLUTION | ORAL | Status: DC | PRN

## 2012-01-28 MED ORDER — ROCURONIUM BROMIDE 100 MG/10ML IV SOLN
INTRAVENOUS | Status: DC | PRN
  Administered 2012-01-28: 10 mg via INTRAVENOUS
  Administered 2012-01-28: 50 mg via INTRAVENOUS
  Administered 2012-01-28: 10 mg via INTRAVENOUS

## 2012-01-28 MED ORDER — MEPERIDINE HCL 25 MG/ML IJ SOLN: 6.2500 mg | INTRAMUSCULAR | Status: DC | PRN

## 2012-01-28 MED ORDER — PROPOFOL 10 MG/ML IV BOLUS
INTRAVENOUS | Status: DC | PRN
  Administered 2012-01-28: 100 mg via INTRAVENOUS

## 2012-01-28 MED ORDER — PHENOL 1.4 % MT LIQD
1.0000 | OROMUCOSAL | Status: DC | PRN
  Administered 2012-01-28: 1 via OROMUCOSAL
  Filled 2012-01-28: qty 177

## 2012-01-28 MED ORDER — LEVOTHYROXINE SODIUM 125 MCG PO TABS
125.0000 ug | ORAL_TABLET | Freq: Every day | ORAL | Status: DC
  Administered 2012-01-29: 125 ug via ORAL
  Filled 2012-01-28 (×2): qty 1

## 2012-01-28 MED ORDER — LIDOCAINE HCL (PF) 1 % IJ SOLN
INTRAMUSCULAR | Status: AC
  Filled 2012-01-28: qty 30

## 2012-01-28 MED ORDER — DEXTRAN 40 IN SALINE 10-0.9 % IV SOLN
INTRAVENOUS | Status: AC
  Filled 2012-01-28: qty 500

## 2012-01-28 MED ORDER — TAMSULOSIN HCL 0.4 MG PO CAPS
0.4000 mg | ORAL_CAPSULE | Freq: Every day | ORAL | Status: DC
  Administered 2012-01-28: 0.4 mg via ORAL
  Filled 2012-01-28 (×3): qty 1

## 2012-01-28 MED ORDER — SODIUM CHLORIDE 0.9 % IV SOLN
INTRAVENOUS | Status: DC
  Administered 2012-01-28: 13:00:00 via INTRAVENOUS
  Administered 2012-01-28: 500 mL via INTRAVENOUS

## 2012-01-28 MED ORDER — LIDOCAINE HCL (PF) 1 % IJ SOLN
INTRAMUSCULAR | Status: DC | PRN
  Administered 2012-01-28: 30 mL

## 2012-01-28 MED ORDER — ONDANSETRON HCL 4 MG/2ML IJ SOLN
INTRAMUSCULAR | Status: DC | PRN
  Administered 2012-01-28: 4 mg via INTRAVENOUS

## 2012-01-28 MED ORDER — DEXTRAN 40 IN SALINE 10-0.9 % IV SOLN
INTRAVENOUS | Status: DC | PRN
  Administered 2012-01-28: 25 mL/h

## 2012-01-28 MED ORDER — ASPIRIN 81 MG PO TABS: 81.0000 mg | ORAL_TABLET | Freq: Every day | ORAL | Status: DC

## 2012-01-28 MED ORDER — HYDRALAZINE HCL 20 MG/ML IJ SOLN: 10.0000 mg | INTRAMUSCULAR | Status: DC | PRN

## 2012-01-28 MED ORDER — SODIUM CHLORIDE 0.9 % IR SOLN
Status: DC | PRN
  Administered 2012-01-28: 08:00:00

## 2012-01-28 MED ORDER — LIDOCAINE-EPINEPHRINE (PF) 1 %-1:200000 IJ SOLN
INTRAMUSCULAR | Status: DC | PRN
  Administered 2012-01-28: 30 mL

## 2012-01-28 MED ORDER — FAMOTIDINE 10 MG PO TABS
10.0000 mg | ORAL_TABLET | Freq: Every day | ORAL | Status: DC
  Administered 2012-01-29: 10 mg via ORAL
  Filled 2012-01-28: qty 1

## 2012-01-28 MED ORDER — LABETALOL HCL 5 MG/ML IV SOLN: 10.0000 mg | INTRAVENOUS | Status: DC | PRN

## 2012-01-28 MED ORDER — METOPROLOL SUCCINATE ER 25 MG PO TB24
25.0000 mg | ORAL_TABLET | Freq: Every day | ORAL | Status: DC
  Administered 2012-01-29: 25 mg via ORAL
  Filled 2012-01-28: qty 1

## 2012-01-28 MED ORDER — LIDOCAINE HCL 4 % MT SOLN
OROMUCOSAL | Status: DC | PRN
  Administered 2012-01-28: 4 mL via TOPICAL

## 2012-01-28 MED ORDER — MORPHINE SULFATE 2 MG/ML IJ SOLN
2.0000 mg | INTRAMUSCULAR | Status: DC | PRN
  Administered 2012-01-28: 2 mg via INTRAVENOUS
  Filled 2012-01-28: qty 1

## 2012-01-28 MED ORDER — LIDOCAINE-EPINEPHRINE (PF) 1 %-1:200000 IJ SOLN
INTRAMUSCULAR | Status: AC
  Filled 2012-01-28: qty 10

## 2012-01-28 MED ORDER — PHENYLEPHRINE HCL 10 MG/ML IJ SOLN
10.0000 mg | INTRAVENOUS | Status: DC | PRN
  Administered 2012-01-28: 50 ug/min via INTRAVENOUS

## 2012-01-28 MED ORDER — PROTAMINE SULFATE 10 MG/ML IV SOLN
INTRAVENOUS | Status: DC | PRN
  Administered 2012-01-28 (×4): 10 mg via INTRAVENOUS

## 2012-01-28 MED ORDER — GLYCOPYRROLATE 0.2 MG/ML IJ SOLN
INTRAMUSCULAR | Status: DC | PRN
  Administered 2012-01-28 (×4): 0.2 mg via INTRAVENOUS

## 2012-01-28 MED ORDER — FENTANYL CITRATE 0.05 MG/ML IJ SOLN
INTRAMUSCULAR | Status: DC | PRN
  Administered 2012-01-28 (×2): 50 ug via INTRAVENOUS
  Administered 2012-01-28: 150 ug via INTRAVENOUS

## 2012-01-28 MED ORDER — DOCUSATE SODIUM 100 MG PO CAPS
100.0000 mg | ORAL_CAPSULE | Freq: Every day | ORAL | Status: DC
  Administered 2012-01-29: 100 mg via ORAL

## 2012-01-28 MED ORDER — LACTATED RINGERS IV SOLN
INTRAVENOUS | Status: DC | PRN
  Administered 2012-01-28 (×2): via INTRAVENOUS

## 2012-01-28 MED ORDER — HYDROMORPHONE HCL PF 1 MG/ML IJ SOLN: 0.2500 mg | INTRAMUSCULAR | Status: DC | PRN

## 2012-01-28 MED ORDER — HEPARIN SODIUM (PORCINE) 1000 UNIT/ML IJ SOLN
INTRAMUSCULAR | Status: DC | PRN
  Administered 2012-01-28: 9000 [IU] via INTRAVENOUS

## 2012-01-28 MED ORDER — INFLUENZA VIRUS VACC SPLIT PF IM SUSP
0.5000 mL | INTRAMUSCULAR | Status: AC
  Administered 2012-01-29: 0.5 mL via INTRAMUSCULAR
  Filled 2012-01-28: qty 0.5

## 2012-01-28 SURGICAL SUPPLY — 52 items
ADH SKN CLS APL DERMABOND .7 (GAUZE/BANDAGES/DRESSINGS) ×1
BAG DECANTER FOR FLEXI CONT (MISCELLANEOUS) ×2 IMPLANT
CANISTER SUCTION 2500CC (MISCELLANEOUS) ×2 IMPLANT
CANNULA VESSEL W/WING WO/VALVE (CANNULA) ×2 IMPLANT
CATH ROBINSON RED A/P 18FR (CATHETERS) ×2 IMPLANT
CLIP TI MEDIUM 24 (CLIP) ×2 IMPLANT
CLIP TI WIDE RED SMALL 24 (CLIP) ×2 IMPLANT
CLOTH BEACON ORANGE TIMEOUT ST (SAFETY) ×2 IMPLANT
COVER SURGICAL LIGHT HANDLE (MISCELLANEOUS) ×2 IMPLANT
CRADLE DONUT ADULT HEAD (MISCELLANEOUS) ×2 IMPLANT
DERMABOND ADVANCED (GAUZE/BANDAGES/DRESSINGS) ×1
DERMABOND ADVANCED .7 DNX12 (GAUZE/BANDAGES/DRESSINGS) ×1 IMPLANT
DRAIN CHANNEL 15F RND FF W/TCR (WOUND CARE) IMPLANT
DRAPE WARM FLUID 44X44 (DRAPE) ×2 IMPLANT
ELECT REM PT RETURN 9FT ADLT (ELECTROSURGICAL) ×2
ELECTRODE REM PT RTRN 9FT ADLT (ELECTROSURGICAL) ×1 IMPLANT
EVACUATOR SILICONE 100CC (DRAIN) IMPLANT
GLOVE BIO SURGEON STRL SZ 6.5 (GLOVE) ×1 IMPLANT
GLOVE BIO SURGEON STRL SZ7.5 (GLOVE) ×2 IMPLANT
GLOVE BIOGEL PI IND STRL 6 (GLOVE) IMPLANT
GLOVE BIOGEL PI IND STRL 7.0 (GLOVE) IMPLANT
GLOVE BIOGEL PI IND STRL 8 (GLOVE) ×1 IMPLANT
GLOVE BIOGEL PI INDICATOR 6 (GLOVE) ×1
GLOVE BIOGEL PI INDICATOR 7.0 (GLOVE) ×2
GLOVE BIOGEL PI INDICATOR 8 (GLOVE) ×1
GLOVE ECLIPSE 6.0 STRL STRAW (GLOVE) ×1 IMPLANT
GLOVE SURG SS PI 7.5 STRL IVOR (GLOVE) ×2 IMPLANT
GOWN PREVENTION PLUS XLARGE (GOWN DISPOSABLE) ×1 IMPLANT
GOWN STRL NON-REIN LRG LVL3 (GOWN DISPOSABLE) ×6 IMPLANT
KIT BASIN OR (CUSTOM PROCEDURE TRAY) ×2 IMPLANT
KIT ROOM TURNOVER OR (KITS) ×2 IMPLANT
NDL HYPO 25X1 1.5 SAFETY (NEEDLE) ×1 IMPLANT
NEEDLE HYPO 25X1 1.5 SAFETY (NEEDLE) ×2 IMPLANT
NS IRRIG 1000ML POUR BTL (IV SOLUTION) ×4 IMPLANT
PACK CAROTID (CUSTOM PROCEDURE TRAY) ×2 IMPLANT
PAD ARMBOARD 7.5X6 YLW CONV (MISCELLANEOUS) ×4 IMPLANT
SHUNT CAROTID BYPASS 10 (VASCULAR PRODUCTS) IMPLANT
SHUNT CAROTID BYPASS 12FRX15.5 (VASCULAR PRODUCTS) ×1 IMPLANT
SPECIMEN JAR SMALL (MISCELLANEOUS) ×2 IMPLANT
SPONGE SURGIFOAM ABS GEL 100 (HEMOSTASIS) IMPLANT
SUT PROLENE 6 0 BV (SUTURE) ×3 IMPLANT
SUT PROLENE 7 0 BV 1 (SUTURE) IMPLANT
SUT SILK 2 0 FS (SUTURE) IMPLANT
SUT SILK 3 0 (SUTURE) ×2
SUT SILK 3-0 18XBRD TIE 12 (SUTURE) IMPLANT
SUT VIC AB 3-0 SH 27 (SUTURE) ×2
SUT VIC AB 3-0 SH 27X BRD (SUTURE) ×1 IMPLANT
SUT VICRYL 4-0 PS2 18IN ABS (SUTURE) ×2 IMPLANT
SYR CONTROL 10ML LL (SYRINGE) ×2 IMPLANT
TOWEL OR 17X24 6PK STRL BLUE (TOWEL DISPOSABLE) ×2 IMPLANT
TOWEL OR 17X26 10 PK STRL BLUE (TOWEL DISPOSABLE) ×2 IMPLANT
WATER STERILE IRR 1000ML POUR (IV SOLUTION) ×2 IMPLANT

## 2012-01-28 NOTE — Progress Notes (Signed)
Art. line SBP's in the 80-90's range,  Cuff 105 to 111. SB 57-60's. Pt alert, oriented, no c/o. Dr Michelle Piper notified about BP.  No new orders. Will cont.  to monitor and he will be by to see pt.

## 2012-01-28 NOTE — H&P (View-Only) (Signed)
[facility]ist of [facility]  Patient name: Leonard Berg MRN: [id number] DOB: 07/20/1929 Sex: male  REASON FOR CONSULT: severe bilateral carotid disease. Referred by Dr. Tonny Bollman.  HPI: Leonard Berg is a 78 y.o. male who was found have bilateral carotid bruits. This prompted a carotid duplex scan which showed bilateral greater than 80% right carotid stenoses. The patient is right-handed. He states that 2 nights ago he had an episode of transient loss of vision in his right eye which lasted for 5 minutes and then resolve completely. He denies any history of stroke, TIAs, expressive or receptive aphasia.   Past Medical History  Diagnosis Date  . Hypertension   . Hyperlipidemia   . Pulmonary embolism 2009    hx of  . DJD (degenerative joint disease)   . Arrhythmia     PAROXYSMAL ATRIAL FIBRILLATION  . Coronary artery disease 8/11    CABG...LM EMERGENT WITH IABP  . RBBB (right bundle branch block)   . Unstable angina   . Arthritis     OSTEOARTHRITIS  . Anemia   . Thrombocytopenia   . History of BPH   . Thyroid disease     HYPOTHYROIDISM  . DVT (deep venous thrombosis)   . Renal artery stenosis     Family History  Problem Relation Age of Onset  . Lung cancer Father 62    deceased   . Cancer Father 29    LUNG  . Stroke Mother 40    DECEASED   . Hyperlipidemia Mother   . Heart attack Brother 59    deceased  . Diabetes Brother   . Liver cancer Sister 35    deceased   . Cancer Sister     SOCIAL HISTORY: History  Substance Use Topics  . Smoking status: Former Smoker    Quit date: 04/13/1974  . Smokeless tobacco: Former Neurosurgeon    Quit date: 01/26/2002   Comment: He has smoked about 27-pack-year hx   . Alcohol Use: 1.2 oz/week    2 Cans of beer per week     He used to drink more heavily , but rarely drinks at the current time    No Known Allergies  Current Outpatient Prescriptions  Medication Sig Dispense Refill  . aspirin 81 MG  tablet Take 81 mg by mouth daily.        Marland Kitchen levothyroxine (SYNTHROID, LEVOTHROID) 125 MCG tablet Take 125 mcg by mouth daily.        . metoprolol succinate (TOPROL XL) 25 MG 24 hr tablet Take 1 tablet (25 mg total) by mouth daily.  30 tablet  11  . Multiple Vitamins-Minerals (CENTRUM SILVER PO) Take 1 tablet by mouth daily.        . Omega-3 Fatty Acids (FISH OIL) 1000 MG CAPS Take 2 capsules by mouth daily.        . Ranitidine HCl (ZANTAC 75 PO) Take by mouth daily.      . rosuvastatin (CRESTOR) 10 MG tablet Take 1 tablet (10 mg total) by mouth at bedtime.  30 tablet  11  . Tamsulosin HCl (FLOMAX) 0.4 MG CAPS Take 0.4 mg by mouth daily.         No current facility-administered medications for this visit.   Facility-Administered Medications Ordered in Other Visits  Medication Dose Route Frequency Provider Last Rate Last Dose  . iohexol (OMNIPAQUE) 350 MG/ML injection 100 mL  100 mL Intravenous Once PRN Medication Radiologist, MD   100 mL at  01/26/12 1616    REVIEW OF SYSTEMS: Arly.Keller ] denotes positive finding; [  ] denotes negative finding  CARDIOVASCULAR:  [ ]  chest pain   [ ]  chest pressure   [ ]  palpitations   [ ]  orthopnea   Arly.Keller ] dyspnea on exertion   [ ]  claudication   [ ]  rest pain   Arly.Keller ] DVT   [ ]  phlebitis PULMONARY:   [ ]  productive cough   [ ]  asthma   [ ]  wheezing NEUROLOGIC:   [ ]  weakness  [ ]  paresthesias  [ ]  aphasia  Arly.Keller ] amaurosis - Right I  [ ]  dizziness HEMATOLOGIC:   [ ]  bleeding problems   [ ]  clotting disorders MUSCULOSKELETAL:  [ ]  joint pain   [ ]  joint swelling [ ]  leg swelling GASTROINTESTINAL: [ ]   blood in stool  [ ]   hematemesis GENITOURINARY:  [ ]   dysuria  [ ]   hematuria PSYCHIATRIC:  [ ]  history of major depression INTEGUMENTARY:  [ ]  rashes  [ ]  ulcers CONSTITUTIONAL:  [ ]  fever   [ ]  chills  PHYSICAL EXAM: Filed Vitals:   01/27/12 1053 01/27/12 1056  BP: 127/79 129/72  Pulse: 88 88  Resp: 16 16  Height: 6\' 2"  (1.88 m)   Weight: 229 lb 9.6 oz (104.146  kg)   SpO2: 95%    Body mass index is 29.48 kg/(m^2). GENERAL: The patient is a well-nourished male, in no acute distress. The vital signs are documented above. CARDIOVASCULAR: There is a regular rate and rhythm. He has bilateral carotid bruits. I cannot palpate pedal pulses although both feet are warm. He has mild bilateral lower extremity swelling. PULMONARY: There is good air exchange bilaterally without wheezing or rales. ABDOMEN: Soft and non-tender with normal pitched bowel sounds. I cannot palpate an abdominal aortic aneurysm. MUSCULOSKELETAL: There are no major deformities or cyanosis. NEUROLOGIC: No focal weakness or paresthesias are detected. SKIN: There are no ulcers or rashes noted. PSYCHIATRIC: The patient has a normal affect.  DATA:  I have reviewed his carotid duplex scan which shows bilateral greater than 80% carotid stenoses. No seizure severely calcified and extend up into the internal carotid arteries about 2 cm.  I also reviewed his CT angiography neck which shows severe bilateral carotid stenoses. Both carotid stenoses appear to be surgically accessible.  MEDICAL ISSUES:  Occlusion and stenosis of carotid artery without mention of cerebral infarction This patient has a greater than 80% symptomatic right carotid stenosis. He had an episode of amaurosis fugax in his right eye 2 nights ago. He has an asymptomatic greater than 80% left carotid stenosis. I have recommended staged bilateral carotid endarterectomies. I would recommend that we start with the right side as this is symptomatic. I have reviewed the indications for carotid endarterectomy, that is to lower the risk of future stroke. I have also reviewed the potential complications of surgery, including but not limited to: bleeding, stroke (perioperative risk 1-2%), MI, nerve injury of other unpredictable medical problems. All of the patients questions were answered and they are agreeable to proceed with surgery. He is on  aspirin and is to continue his aspirin right up through surgery. He is scheduled for right carotid endarterectomy tomorrow, 01/28/2012. We would likely proceed with staged left carotid endarterectomy in 4-6 weeks.     DICKSON,CHRISTOPHER S [facility] [place] Beeper: [phone]

## 2012-01-28 NOTE — Transfer of Care (Signed)
Immediate Anesthesia Transfer of Care Note  Patient: Leonard Berg  Procedure(s) Performed: Procedure(s) (LRB) with comments: ENDARTERECTOMY CAROTID (Right) - with Primary Closure of Artery  Patient Location: PACU  Anesthesia Type: General  Level of Consciousness: awake, alert  and oriented  Airway & Oxygen Therapy: Patient Spontanous Breathing and Patient connected to nasal cannula oxygen  Post-op Assessment: Report given to PACU RN  Post vital signs: Reviewed and stable  Complications: No apparent anesthesia complications

## 2012-01-28 NOTE — Discharge Summary (Signed)
[facility] Specialists Discharge Summary   Patient ID:  Leonard Berg MRN: [id number] DOB/AGE: 04-13-1930 78 y.o.  Admit date: 01/28/2012 Discharge date: 01/29/12 Date of Surgery: 01/28/2012 Surgeon: Surgeon(s): Chuck Hint, MD  Admission Diagnosis: Right Internal Carotid Artery Stenosis  Discharge Diagnoses:  Right Internal Carotid Artery Stenosis  Secondary Diagnoses: Past Medical History  Diagnosis Date  . Hypertension   . Hyperlipidemia   . Pulmonary embolism 2009    hx of  . DJD (degenerative joint disease)   . Arrhythmia     PAROXYSMAL ATRIAL FIBRILLATION  . RBBB (right bundle branch block)   . Unstable angina   . Arthritis     OSTEOARTHRITIS  . Anemia   . Thrombocytopenia   . History of BPH   . Thyroid disease     HYPOTHYROIDISM  . DVT (deep venous thrombosis)   . Coronary artery disease 8/11    CABG...LM EMERGENT WITH IABP sees Dr. Casimiro Needle cooper  . Chronic back pain   . Cataracts, bilateral   . Renal artery stenosis     Procedure(s): ENDARTERECTOMY CAROTID  Discharged Condition: good  HPI:  Leonard Berg is a 78 y.o. male who was found have bilateral carotid bruits. This prompted a carotid duplex scan which showed bilateral greater than 80% right carotid stenoses. The patient is right-handed. He states that 2 nights ago he had an episode of transient loss of vision in his right eye which lasted for 5 minutes and then resolve completely. He denies any history of stroke, TIAs, expressive or receptive aphasia. Pt admitted for right CEA.  Hospital Course:  KENDARRIUS Berg is a 78 y.o. male is S/P Right Procedure(s): ENDARTERECTOMY CAROTID Extubated: POD # 0 Post-op wounds healing well Neuro exam: intact Pt. Ambulating, voiding and taking PO diet without difficulty. Pt pain controlled with PO pain meds. Labs as below Complications:none  Consults:     Significant Diagnostic Studies: CBC Lab Results  Component  Value Date   WBC 8.0 01/27/2012   HGB 16.2 01/27/2012   HCT 48.1 01/27/2012   MCV 95.6 01/27/2012   PLT 186 01/27/2012    BMET    Component Value Date/Time   NA 142 01/27/2012 1429   K 4.8 01/27/2012 1429   CL 103 01/27/2012 1429   CO2 29 01/27/2012 1429   GLUCOSE 83 01/27/2012 1429   BUN 19 01/27/2012 1429   CREATININE 1.57* 01/27/2012 1429   CALCIUM 9.5 01/27/2012 1429   GFRNONAA 40* 01/27/2012 1429   GFRAA 47* 01/27/2012 1429   COAG Lab Results  Component Value Date   INR 1.01 01/27/2012   INR 0.9 07/16/2010   INR 1.34 11/20/2009     Disposition:  Discharge to :Home Discharge Orders    Future Orders Please Complete By Expires   Resume previous diet      Driving Restrictions      Comments:   No driving for 2 weeks   Lifting restrictions      Comments:   No lifting for 4 weeks   Call MD for:  temperature >100.5      Call MD for:  redness, tenderness, or signs of infection (pain, swelling, bleeding, redness, odor or green/yellow discharge around incision site)      Call MD for:  severe or increased pain, loss or decreased feeling  in affected limb(s)      Increase activity slowly      Comments:   Walk with assistance use walker or cane as  needed   May shower       Scheduling Instructions:   saturday   may wash over wound with mild soap and water      No dressing needed      CAROTID Sugery: Call MD for difficulty swallowing or speaking; weakness in arms or legs that is a new symtom; severe headache.  If you have increased swelling in the neck and/or  are having difficulty breathing, CALL 911         Ariyan, Brisendine  Home Medication Instructions [id number]   Printed on:01/28/12 [phone]  Medication Information                    rosuvastatin (CRESTOR) 10 MG tablet Take 1 tablet (10 mg total) by mouth at bedtime.           levothyroxine (SYNTHROID, LEVOTHROID) 125 MCG tablet Take 125 mcg by mouth daily.             Tamsulosin HCl (FLOMAX) 0.4 MG  CAPS Take 0.4 mg by mouth daily.             aspirin 81 MG tablet Take 81 mg by mouth daily.             Omega-3 Fatty Acids (FISH OIL) 1000 MG CAPS Take 2 capsules by mouth daily.             Multiple Vitamins-Minerals (CENTRUM SILVER PO) Take 1 tablet by mouth daily.             metoprolol succinate (TOPROL XL) 25 MG 24 hr tablet Take 1 tablet (25 mg total) by mouth daily.           Ranitidine HCl (ZANTAC 75 PO) Take by mouth daily.           oxyCODONE-acetaminophen (ROXICET) 5-325 MG per tablet Take 1 tablet by mouth every 4 (four) hours as needed for pain.            Verbal and written Discharge instructions given to the patient. Wound care per Discharge AVS   Signed: Marlowe Shores 01/28/2012, 9:42 AM  Neuro intact. Only complaint is sinus congestion. Incision fine. Plan d/c today.  Di Kindle. Edilia Bo, MD, FACS Beeper [phone] 01/29/2012

## 2012-01-28 NOTE — Anesthesia Preprocedure Evaluation (Addendum)
Anesthesia Evaluation  Patient identified by MRN, date of birth, ID band Patient awake    Reviewed: Allergy & Precautions, H&P , NPO status , Patient's Chart, lab work & pertinent test results, reviewed documented beta blocker date and time   History of Anesthesia Complications Negative for: history of anesthetic complications  Airway Mallampati: II TM Distance: >3 FB Neck ROM: Full    Dental  (+) Edentulous Upper, Edentulous Lower and Dental Advisory Given   Pulmonary neg pulmonary ROS, former smoker, PE         Cardiovascular hypertension, Pt. on medications - angina+ CAD and + CABG + dysrhythmias Atrial Fibrillation     Neuro/Psych negative neurological ROS  negative psych ROS   GI/Hepatic GERD-  ,  Endo/Other  Hypothyroidism   Renal/GU Renal InsufficiencyRenal disease     Musculoskeletal   Abdominal   Peds  Hematology   Anesthesia Other Findings   Reproductive/Obstetrics                        Anesthesia Physical Anesthesia Plan  ASA: III  Anesthesia Plan: General   Post-op Pain Management:    Induction: Intravenous  Airway Management Planned: Oral ETT  Additional Equipment: Arterial line  Intra-op Plan:   Post-operative Plan: Extubation in OR  Informed Consent: I have reviewed the patients History and Physical, chart, labs and discussed the procedure including the risks, benefits and alternatives for the proposed anesthesia with the patient or authorized representative who has indicated his/her understanding and acceptance.     Plan Discussed with: CRNA and Surgeon  Anesthesia Plan Comments:         Anesthesia Quick Evaluation

## 2012-01-28 NOTE — Interval H&P Note (Signed)
History and Physical Interval Note:  01/28/2012 7:08 AM  Leonard Berg  has presented today for surgery, with the diagnosis of Right Internal Carotid Artery Stenosis  The various methods of treatment have been discussed with the patient and family. After consideration of risks, benefits and other options for treatment, the patient has consented to  Procedure(s) (LRB) with comments: ENDARTERECTOMY CAROTID (Right) as a surgical intervention .  The patient's history has been reviewed, patient examined, no change in status, stable for surgery.  I have reviewed the patient's chart and labs.  Questions were answered to the patient's satisfaction.     Samuell Knoble S

## 2012-01-28 NOTE — Op Note (Signed)
NAME: Leonard Berg   MRN: [id number] DOB: [birth year]    DATE OF OPERATION: 01/28/2012  PREOP DIAGNOSIS: symptomatic greater than 80% right carotid stenosis  POSTOP DIAGNOSIS: same  PROCEDURE: right carotid endarterectomy with primary closure  SURGEON: Di Kindle. Edilia Bo, MD, FACS  ASSIST: Della Goo PA  ANESTHESIA: Gen.   EBL: 200 cc  INDICATIONS: Leonard Berg is a 78 y.o. male who had an episode of amaurosis fugax in the right eye 2 nights ago. Duplex scan showed bilateral severe greater than 80% carotid stenoses. Staged bilateral carotid endarterectomies was recommended. As the right side was symptomatic I elected to address the right side first.  FINDINGS: the plaque extended high up the internal carotid artery and I had to control the artery well above the hypoglossal nerve. In addition, there was a large posterior pharyngeal branch which was preserved. The stenosis was 90%.  TECHNIQUE: The patient was brought to the operating room and received a general anesthetic. Right neck was prepped and draped in the usual sterile fashion. An incision was made along the anterior border of the sternocleidomastoid and dissection carried down to the common carotid artery which was quite deep. In addition he had fairly limited neck mobility. The facial vein was divided between 2-0 silk ties. The superior thyroid artery and external carotid arteries were controlled. In order to control the internal carotid artery above the plaque, I had to fully mobilize the hypoglossal nerve and retract this inferiorly to allow exposure of the internal carotid artery above the hypoglossal nerve. The patient was then heparinized. Clamps were then placed on the internal then the external then the common carotid artery. A longitudinal arteriotomy was made in the common carotid artery and this was extended through the plaque into the internal carotid artery. A 12 shunt was placed into the internal carotid  artery back bled and then placed into the common carotid artery and secured with Rummel tourniquet. There was significant backbleeding from what appeared to be the internal carotid artery however I had controlled the artery circumferentially with a loop and it became clear that there was a large posterior pharyngeal branch which was patent. Because of the hypoglossal nerve there was really no way to fully mobilize the posterior wall of the internal carotid artery. This reason I removed the shunt and was able to get a clamp across the internal carotid artery also encompassing the posterior pharyngeal branch. This provided adequate hemostasis. I elected to proceed without a shunt as there was excellent backbleeding. Endarterectomy plane was established proximally and the plaque was sharply divided. The reverse endarterectomy was performed of the external carotid artery. Distally there was a nice tapering the plaque and no tacking sutures were required. The artery was irrigated with copious amounts of saline and dextran all loose debris removed. The artery was very large and I felt that if I placed a patch this would result in an aneurysmal artery with risk for ratio laminated thrombus and significant turbulence. For this reason I elected to close the artery primarily. This was done with 2 continuous 6-0 Prolene sutures. Prior to completing the closure the arteries were backbled and flushed appropriately and the anastomosis completed. Flow was reestablished first to the external carotid artery and into the internal carotid artery. At the completion was good pulse distal to the patch and the Doppler signal with good diastolic flow. Hemostasis was obtained in the wound and the heparin was partially reversed with protamine. The wounds closed the deep layer of  3-0 Vicryl. The platysma was closed with running 3-0 Vicryl. The skin was closed with 4-0 subcuticular stitch. Dermabond was applied. The patient tolerated the  procedure well and was transferred to the recovery room in stable condition. All needle and sponge counts were correct.  Leonard Ferrari, MD, FACS [facility]  DATE OF DICTATION:   01/28/2012

## 2012-01-28 NOTE — Telephone Encounter (Signed)
Sent letter, dpm °

## 2012-01-28 NOTE — Plan of Care (Signed)
Problem: Consults Goal: Diagnosis CEA/CES/AAA Stent Outcome: Progressing cea

## 2012-01-28 NOTE — Telephone Encounter (Signed)
Message copied by Fredrich Birks on Thu Jan 28, 2012  1:17 PM ------      Message from: Melene Plan      Created: Thu Jan 28, 2012 12:41 PM      Regarding: FW: charge and f/u                   ----- Message -----         From: Chuck Hint, MD         Sent: 01/28/2012  10:16 AM           To: Reuel Derby, Melene Plan, RN      Subject: charge and f/u                                           PROCEDURE: right carotid endarterectomy with primary closure            SURGEON: Di Kindle. Edilia Bo, MD, FACS            ASSIST: Della Goo PA            He'll need a follow visit in 2 weeks thank you. CSD

## 2012-01-28 NOTE — Anesthesia Postprocedure Evaluation (Signed)
Anesthesia Post Note  Patient: Leonard Berg  Procedure(s) Performed: Procedure(s) (LRB): ENDARTERECTOMY CAROTID (Right)  Anesthesia type: general  Patient location: PACU  Post pain: Pain level controlled  Post assessment: Patient's Cardiovascular Status Stable  Last Vitals:  Filed Vitals:   01/28/12 1145  BP: 89/41  Pulse:   Temp:   Resp:     Post vital signs: Reviewed and stable  Level of consciousness: sedated  Complications: No apparent anesthesia complications

## 2012-01-28 NOTE — Progress Notes (Signed)
VASCULAR PROGRESS NOTE  SUBJECTIVE: Comfotable  PHYSICAL EXAM: Filed Vitals:   01/28/12 1221 01/28/12 1235 01/28/12 1300 01/28/12 1400  BP: 97/41 99/45 92/42  108/41  Pulse: 47 45 45 45  Temp: 97.6 F (36.4 C)     TempSrc: Oral     Resp: 18 13 11 14   Height: 6\' 3"  (1.905 m)     Weight: 231 lb 4.2 oz (104.9 kg)     SpO2: 100%      Incision looks fine. Neuro intact Tongue midline  ASSESSMENT/PLAN: 1. Doing well post op. 2. Anticipate d/c in AM  Waverly Ferrari, MD, FACS Beeper: [phone] 01/28/2012

## 2012-01-28 NOTE — Progress Notes (Signed)
Dr Edilia Bo here to see pt, aware of BP's. Will go by cuff pressures. No new orders.

## 2012-01-28 NOTE — Preoperative (Signed)
Beta Blockers   Reason not to administer Beta Blockers:Not Applicable 

## 2012-01-28 NOTE — Progress Notes (Signed)
Report to E. Compton RN as primary caregiver. 

## 2012-01-28 NOTE — Progress Notes (Signed)
Pt admitted to 3300 from pacu. Pt oriented to unit and room. Safety discussed. Call bell with in reach. VSS. Speech is slightly slurred but is patients norm per wife. Tongue is slightly deviated to right. Will continue to monitor.

## 2012-01-29 ENCOUNTER — Encounter (HOSPITAL_COMMUNITY): Payer: Self-pay | Admitting: Vascular Surgery

## 2012-01-29 LAB — BASIC METABOLIC PANEL
BUN: 21 mg/dL (ref 6–23)
Calcium: 8 mg/dL — ABNORMAL LOW (ref 8.4–10.5)
GFR calc non Af Amer: 44 mL/min — ABNORMAL LOW (ref >90)
Glucose, Bld: 100 mg/dL — ABNORMAL HIGH (ref 70–99)
Sodium: 139 mEq/L (ref 135–145)

## 2012-01-29 LAB — CBC
Hemoglobin: 12.1 g/dL — ABNORMAL LOW (ref 13.0–17.0)
MCH: 31.3 pg (ref 26.0–34.0)
MCHC: 32.7 g/dL (ref 30.0–36.0)
RDW: 12.8 % (ref 11.5–15.5)

## 2012-01-29 MED ORDER — OXYMETAZOLINE HCL 0.05 % NA SOLN
1.0000 | Freq: Two times a day (BID) | NASAL | Status: DC
  Filled 2012-01-29: qty 15

## 2012-01-29 MED ORDER — OXYMETAZOLINE HCL 0.05 % NA SOLN: 1.0000 | Freq: Two times a day (BID) | NASAL | Status: DC

## 2012-01-29 NOTE — Progress Notes (Signed)
Pt discharged home via w/c with belongings and wife, explained and discussed discharge instructions, f/u visit, prescriptions, and care notes on after care for carotid endarectomy.  Leonard Berg

## 2012-01-29 NOTE — Progress Notes (Signed)
[facility] Progress Note  01/29/2012 7:10 AM POD 1  Subjective:  "having trouble breathing, I forgot my nasal spray"  Afebrile 80's 120's systolic (received a couple of fluid boluses overnight) Otherwise VSS  Filed Vitals:   01/29/12 0401  BP: 102/43  Pulse: 58  Temp: 99 F (37.2 C)  Resp: 15     Physical Exam: Neuro:  In tact without deficit Incision:  C/d/i without hematoma  CBC    Component Value Date/Time   WBC 8.6 01/29/2012 0500   RBC 3.86* 01/29/2012 0500   HGB 12.1* 01/29/2012 0500   HCT 37.0* 01/29/2012 0500   PLT 131* 01/29/2012 0500   MCV 95.9 01/29/2012 0500   MCH 31.3 01/29/2012 0500   MCHC 32.7 01/29/2012 0500   RDW 12.8 01/29/2012 0500   LYMPHSABS 2.3 08/12/2010 1523   MONOABS 0.8 08/12/2010 1523   EOSABS 0.1 08/12/2010 1523   BASOSABS 0.0 08/12/2010 1523    BMET    Component Value Date/Time   NA 139 01/29/2012 0500   K 4.4 01/29/2012 0500   CL 109 01/29/2012 0500   CO2 23 01/29/2012 0500   GLUCOSE 100* 01/29/2012 0500   BUN 21 01/29/2012 0500   CREATININE 1.46* 01/29/2012 0500   CALCIUM 8.0* 01/29/2012 0500   GFRNONAA 44* 01/29/2012 0500   GFRAA 51* 01/29/2012 0500     Intake/Output Summary (Last 24 hours) at 01/29/12 0710 Last data filed at 01/29/12 0500  Gross per 24 hour  Intake   4450 ml  Output   1000 ml  Net   3450 ml      Assessment/Plan:  This is a 78 y.o. male who is s/p right CEA POD 1  -doing well-will order nasal spray -pt has ambulated and voided. -discharge home   Doreatha Massed, PA-C [facility] [phone]

## 2012-01-29 NOTE — Progress Notes (Signed)
VASCULAR PROGRESS NOTE  SUBJECTIVE: comfortable. C/o sinus congestion.  PHYSICAL EXAM: Filed Vitals:   01/29/12 0012 01/29/12 0200 01/29/12 0300 01/29/12 0401  BP: 103/50 112/47 98/43 102/43  Pulse: 60 56 50 58  Temp: 99.1 F (37.3 C)   99 F (37.2 C)  TempSrc: Oral   Oral  Resp: 14 14 12 15   Height:      Weight:      SpO2: 97% 95% 96% 96%   Neuro intact  Incision looks good   LABS: Lab Results  Component Value Date   WBC 8.6 01/29/2012   HGB 12.1* 01/29/2012   HCT 37.0* 01/29/2012   MCV 95.9 01/29/2012   PLT 131* 01/29/2012   Lab Results  Component Value Date   CREATININE 1.46* 01/29/2012   Lab Results  Component Value Date   INR 1.01 01/27/2012   CBG (last 3)  No results found for this basename: GLUCAP:3 in the last 72 hours   ASSESSMENT/PLAN: 1. 1 Day Post-Op s/p: R CEA 2. D/c today. F/u in 2 weeks 3. Will need staged L CEA but given high dissection, will get ENT eval prior to be sure no vocal cord issues.  Waverly Ferrari, MD, FACS Beeper: [phone] 01/29/2012

## 2012-01-29 NOTE — Progress Notes (Signed)
Patient's cuff Bp 91/43; second 500 cc bolus started.  Patient reports feeling light headed.

## 2012-01-29 NOTE — Discharge Summary (Signed)
[facility] Discharge Summary  Leonard Berg 05-12-1929 78 y.o. male  [id number]  Admission Date: 01/28/2012  Discharge Date: 01/29/12  Physician: Chuck Hint, MD  Admission Diagnosis: Right Internal Carotid Artery Stenosis   HPI:   This is a 78 y.o. male who was found have bilateral carotid bruits. This prompted a carotid duplex scan which showed bilateral greater than 80% right carotid stenoses. The patient is right-handed. He states that 2 nights ago he had an episode of transient loss of vision in his right eye which lasted for 5 minutes and then resolve completely. He denies any history of stroke, TIAs, expressive or receptive aphasia.    Hospital Course:  The patient was admitted to the hospital and taken to the operating room on 01/28/2012 and underwent right carotid endarterectomy.  The pt tolerated the procedure well and was transported to the PACU in good condition.   By POD 1, the pt neuro status in tact.  He was having nasal congestion and was ordered Afrin as he takes this at home.  The remainder of the hospital course consisted of increasing mobilization and increasing intake of solids without difficulty.    Basename 01/29/12 0500 01/27/12 1429  NA 139 142  K 4.4 4.8  CL 109 103  CO2 23 29  GLUCOSE 100* 83  BUN 21 19  CALCIUM 8.0* 9.5    Basename 01/29/12 0500 01/27/12 1429  WBC 8.6 8.0  HGB 12.1* 16.2  HCT 37.0* 48.1  PLT 131* 186    Basename 01/27/12 1429  INR 1.01     Discharge Instructions:   The patient is discharged to home with extensive instructions on wound care and progressive ambulation.  They are instructed not to drive or perform any heavy lifting until returning to see the physician in his office.  Discharge Orders    Future Appointments: Provider: Department: Dept Phone: Center:   02/17/2012 8:30 AM Chuck Hint, MD [facility] [phone] [facility]     Future Orders Please Complete By  Expires   Resume previous diet      Driving Restrictions      Comments:   No driving for 2 weeks   Lifting restrictions      Comments:   No lifting for 4 weeks   Call MD for:  temperature >100.5      Call MD for:  redness, tenderness, or signs of infection (pain, swelling, bleeding, redness, odor or green/yellow discharge around incision site)      Call MD for:  severe or increased pain, loss or decreased feeling  in affected limb(s)      Increase activity slowly      Comments:   Walk with assistance use walker or cane as needed   May shower       Scheduling Instructions:   saturday   may wash over wound with mild soap and water      No dressing needed      CAROTID Sugery: Call MD for difficulty swallowing or speaking; weakness in arms or legs that is a new symtom; severe headache.  If you have increased swelling in the neck and/or  are having difficulty breathing, CALL [phone]         Discharge Diagnosis:  Right Internal Carotid Artery Stenosis  Secondary Diagnosis: Patient Active Problem List  Diagnosis  . HYPOTHYROIDISM  . MIXED HYPERLIPIDEMIA  . ANEMIA  . THROMBOCYTOPENIA  . HYPERTENSION  . UNSTABLE ANGINA  . Coronary atherosclerosis of native  coronary artery  . PULMONARY EMBOLISM  . RIGHT BUNDLE BRANCH BLOCK  . PAROXYSMAL ATRIAL FIBRILLATION  . OSTEOARTHRITIS  . ARTHRITIS  . CAROTID BRUIT  . BENIGN PROSTATIC HYPERTROPHY, HX OF  . Renal artery atherosclerosis  . Occlusion and stenosis of carotid artery without mention of cerebral infarction   Past Medical History  Diagnosis Date  . Hypertension   . Hyperlipidemia   . Pulmonary embolism 2009    hx of  . DJD (degenerative joint disease)   . Arrhythmia     PAROXYSMAL ATRIAL FIBRILLATION  . RBBB (right bundle branch block)   . Unstable angina   . Arthritis     OSTEOARTHRITIS  . Anemia   . Thrombocytopenia   . History of BPH   . Thyroid disease     HYPOTHYROIDISM  . DVT (deep venous thrombosis)   . Coronary  artery disease 8/11    CABG...LM EMERGENT WITH IABP sees Dr. Casimiro Needle cooper  . Chronic back pain   . Cataracts, bilateral   . Renal artery stenosis      Marvyn, Torrez  Home Medication Instructions AVW:[id number]   Printed on:01/29/12 8295  Medication Information                    rosuvastatin (CRESTOR) 10 MG tablet Take 1 tablet (10 mg total) by mouth at bedtime.           levothyroxine (SYNTHROID, LEVOTHROID) 125 MCG tablet Take 125 mcg by mouth daily.             Tamsulosin HCl (FLOMAX) 0.4 MG CAPS Take 0.4 mg by mouth daily.             aspirin 81 MG tablet Take 81 mg by mouth daily.             Omega-3 Fatty Acids (FISH OIL) 1000 MG CAPS Take 2 capsules by mouth daily.             Multiple Vitamins-Minerals (CENTRUM SILVER PO) Take 1 tablet by mouth daily.             metoprolol succinate (TOPROL XL) 25 MG 24 hr tablet Take 1 tablet (25 mg total) by mouth daily.           Ranitidine HCl (ZANTAC 75 PO) Take by mouth daily.           oxyCODONE-acetaminophen (ROXICET) 5-325 MG per tablet Take 1 tablet by mouth every 4 (four) hours as needed for pain.             Disposition: home  Patient's condition: is Good  Follow up: 1. Dr. Edilia Bo in 2 weeks.   Doreatha Massed, PA-C [facility] [phone] 01/29/2012  7:17 AM  Agree with plans for d/c today.  Di Kindle. Edilia Bo, MD, FACS Beeper [phone] 01/29/2012

## 2012-01-29 NOTE — Progress Notes (Signed)
Patient's post bolus BP 97/43; will continue to monitor.  Patient no longer feeling light headed.

## 2012-02-05 ENCOUNTER — Other Ambulatory Visit: Payer: Self-pay | Admitting: *Deleted

## 2012-02-05 DIAGNOSIS — I10 Essential (primary) hypertension: Secondary | ICD-10-CM

## 2012-02-05 MED ORDER — METOPROLOL SUCCINATE ER 25 MG PO TB24: 25.0000 mg | ORAL_TABLET | Freq: Every day | ORAL | Status: DC

## 2012-02-16 ENCOUNTER — Encounter: Payer: Self-pay | Admitting: Vascular Surgery

## 2012-02-17 ENCOUNTER — Encounter: Payer: Self-pay | Admitting: Vascular Surgery

## 2012-02-17 ENCOUNTER — Ambulatory Visit (INDEPENDENT_AMBULATORY_CARE_PROVIDER_SITE_OTHER): Payer: Medicare Other | Admitting: Vascular Surgery

## 2012-02-17 VITALS — BP 122/75 | HR 74 | Temp 98.3°F | Ht 75.0 in | Wt 231.4 lb

## 2012-02-17 DIAGNOSIS — I6529 Occlusion and stenosis of unspecified carotid artery: Secondary | ICD-10-CM

## 2012-02-17 NOTE — Progress Notes (Signed)
[facility] [place]  Patient name: Leonard Berg MRN: [id number] DOB: 01/12/1930 Sex: male  CC: asymptomatic greater than 80% left carotid stenosis.  HPI: Leonard Berg is a 78 y.o. male who had presented initially with an episode of amaurosis fugax in the right eye. Duplex scan showed bilateral greater than 80% carotid stenoses. He underwent right carotid endarterectomy first given that the side was symptomatic. The dissection was very high and he had a large posterior pharyngeal branch making dissection difficult however he did well postoperatively and was discharged on postoperative day #1. He denies any problems with swallowing or hoarseness. He denies any focal weakness or paresthesias. He has no specific complaints today.  Past Medical History  Diagnosis Date  . Hypertension   . Hyperlipidemia   . Pulmonary embolism 2009    hx of  . DJD (degenerative joint disease)   . Arrhythmia     PAROXYSMAL ATRIAL FIBRILLATION  . RBBB (right bundle branch block)   . Unstable angina   . Arthritis     OSTEOARTHRITIS  . Anemia   . Thrombocytopenia   . History of BPH   . Thyroid disease     HYPOTHYROIDISM  . DVT (deep venous thrombosis)   . Coronary artery disease 8/11    CABG...LM EMERGENT WITH IABP sees Dr. Michael cooper  . Chronic back pain   . Cataracts, bilateral   . Renal artery stenosis     Family History  Problem Relation Age of Onset  . Lung cancer Father 52    deceased   . Cancer Father 52    LUNG  . Stroke Mother 93    DECEASED   . Hyperlipidemia Mother   . Heart attack Brother 59    deceased  . Diabetes Brother   . Liver cancer Sister 63    deceased   . Cancer Sister     SOCIAL HISTORY: History  Substance Use Topics  . Smoking status: Former Smoker    Quit date: 04/13/1974  . Smokeless tobacco: Former User    Quit date: 01/26/2002     Comment: He has smoked about 27-pack-year hx   . Alcohol Use: 1.2 oz/week    2 Cans of  beer per week     Comment: He used to drink more heavily , but rarely drinks at the current time    No Known Allergies  Current Outpatient Prescriptions  Medication Sig Dispense Refill  . aspirin 81 MG tablet Take 81 mg by mouth daily.        . fluticasone (FLONASE) 50 MCG/ACT nasal spray Place 2 sprays into the nose as needed.      . levothyroxine (SYNTHROID, LEVOTHROID) 125 MCG tablet Take 125 mcg by mouth daily.        . metoprolol succinate (TOPROL XL) 25 MG 24 hr tablet Take 1 tablet (25 mg total) by mouth daily.  30 tablet  11  . Multiple Vitamins-Minerals (CENTRUM SILVER PO) Take 1 tablet by mouth daily.        . Omega-3 Fatty Acids (FISH OIL) 1000 MG CAPS Take 2 capsules by mouth daily.        . Ranitidine HCl (ZANTAC 75 PO) Take by mouth daily.      . rosuvastatin (CRESTOR) 10 MG tablet Take 1 tablet (10 mg total) by mouth at bedtime.  30 tablet  11  . Tamsulosin HCl (FLOMAX) 0.4 MG CAPS Take 0.4 mg by mouth daily.        .   oxyCODONE-acetaminophen (ROXICET) 5-325 MG per tablet Take 1 tablet by mouth every 4 (four) hours as needed for pain.  30 tablet  0    REVIEW OF SYSTEMS: [X ] denotes positive finding; [  ] denotes negative finding CARDIOVASCULAR:  [ ] chest pain   [ ] chest pressure   [ ] palpitations   [ ] orthopnea   [X ] dyspnea on exertion   [ ] claudication   [ ] rest pain   [ ] DVT   [ ] phlebitis PULMONARY:   [ ] productive cough   [ ] asthma   [ ] wheezing NEUROLOGIC:   [ ] weakness  [ ] paresthesias  [ ] aphasia  [ ] amaurosis  [ ] dizziness HEMATOLOGIC:   [ ] bleeding problems   [ ] clotting disorders MUSCULOSKELETAL:  [ ] joint pain   [ ] joint swelling [ ] leg swelling GASTROINTESTINAL: [ ]  blood in stool  [ ]  hematemesis GENITOURINARY:  [ ]  dysuria  [ ]  hematuria PSYCHIATRIC:  [ ] history of major depression INTEGUMENTARY:  [ ] rashes  [ ] ulcers CONSTITUTIONAL:  [ ] fever   [ ] chills  PHYSICAL EXAM: Filed Vitals:   02/17/12 0838 02/17/12 0840  BP:  142/71 122/75  Pulse: 74   Temp: 98.3 F (36.8 C)   TempSrc: Oral   Height: 6' 3" (1.905 m)   Weight: 231 lb 6.4 oz (104.962 kg)   SpO2: 98%    Body mass index is 28.92 kg/(m^2). GENERAL: The patient is a well-nourished male, in no acute distress. The vital signs are documented above. CARDIOVASCULAR: There is a regular rate and rhythm without significant murmur appreciated. He has a left carotid bruit. He has palpable femoral pulses. I cannot palpate pedal pulses. PULMONARY: There is good air exchange bilaterally without wheezing or rales. ABDOMEN: Soft and non-tender with normal pitched bowel sounds. I do not palpate an abdominal aortic aneurysm. MUSCULOSKELETAL: There are no major deformities or cyanosis. NEUROLOGIC: No focal weakness or paresthesias are detected. SKIN: There are no ulcers or rashes noted. PSYCHIATRIC: The patient has a normal affect.  DATA:  Previous duplex scan showed a greater than 80% left carotid stenosis. This was confirmed by CT angiogram.  MEDICAL ISSUES:  Occlusion and stenosis of carotid artery without mention of cerebral infarction This patient had presented with bilateral greater than 80% carotid stenoses. He had an episode of amaurosis fugax in the right eye and therefore the right carotid stenosis was addressed first. He underwent right carotid endarterectomy with primary closure on 01/28/2012. Of note the dissection was very high and we had to clamp well above the hypoglossal nerve with significant retraction on the hypoglossal nerve. Or he has done well since surgery with no hoarseness or difficulty swallowing. I think he will be ready for staged left carotid endarterectomy in early December. We have scheduled his left carotid endarterectomy for 03/15/2012.I have reviewed the indications for carotid endarterectomy, that is to lower the risk of future stroke. I have also reviewed the potential complications of surgery, including but not limited to: bleeding,  stroke (perioperative risk 1-2%), MI, nerve injury of other unpredictable medical problems. All of the patients questions were answered and they are agreeable to proceed with surgery. He will continue his aspirin right up through surgery.    Omega Durante S [facility] [place] Office: [phone]  

## 2012-02-17 NOTE — Assessment & Plan Note (Signed)
This patient had presented with bilateral greater than 80% carotid stenoses. He had an episode of amaurosis fugax in the right eye and therefore the right carotid stenosis was addressed first. He underwent right carotid endarterectomy with primary closure on 01/28/2012. Of note the dissection was very high and we had to clamp well above the hypoglossal nerve with significant retraction on the hypoglossal nerve. Or he has done well since surgery with no hoarseness or difficulty swallowing. I think he will be ready for staged left carotid endarterectomy in early December. We have scheduled his left carotid endarterectomy for 03/15/2012.I have reviewed the indications for carotid endarterectomy, that is to lower the risk of future stroke. I have also reviewed the potential complications of surgery, including but not limited to: bleeding, stroke (perioperative risk 1-2%), MI, nerve injury of other unpredictable medical problems. All of the patients questions were answered and they are agreeable to proceed with surgery. He will continue his aspirin right up through surgery.

## 2012-02-29 ENCOUNTER — Encounter (HOSPITAL_COMMUNITY): Payer: Self-pay | Admitting: Pharmacy Technician

## 2012-02-29 ENCOUNTER — Other Ambulatory Visit: Payer: Self-pay | Admitting: *Deleted

## 2012-03-03 ENCOUNTER — Encounter (HOSPITAL_COMMUNITY): Payer: Self-pay

## 2012-03-03 ENCOUNTER — Encounter (HOSPITAL_COMMUNITY)
Admission: RE | Admit: 2012-03-03 | Discharge: 2012-03-03 | Disposition: A | Discharge status: Home / Self Care | Payer: Medicare Other | Source: Ambulatory Visit | Attending: Vascular Surgery | Admitting: Vascular Surgery

## 2012-03-03 HISTORY — DX: Hypothyroidism, unspecified: E03.9

## 2012-03-03 LAB — COMPREHENSIVE METABOLIC PANEL
ALT: 18 U/L (ref 0–53)
Alkaline Phosphatase: 67 U/L (ref 39–117)
BUN: 15 mg/dL (ref 6–23)
CO2: 24 mEq/L (ref 19–32)
Calcium: 9.6 mg/dL (ref 8.4–10.5)
GFR calc Af Amer: 60 mL/min — ABNORMAL LOW (ref >90)
GFR calc non Af Amer: 52 mL/min — ABNORMAL LOW (ref >90)
Glucose, Bld: 68 mg/dL — ABNORMAL LOW (ref 70–99)
Potassium: 4.1 mEq/L (ref 3.5–5.1)
Total Protein: 7 g/dL (ref 6.0–8.3)

## 2012-03-03 LAB — URINALYSIS, ROUTINE W REFLEX MICROSCOPIC
Glucose, UA: NEGATIVE mg/dL
Ketones, ur: 15 mg/dL — AB
Leukocytes, UA: NEGATIVE
Protein, ur: NEGATIVE mg/dL
pH: 5 (ref 5.0–8.0)

## 2012-03-03 LAB — PROTIME-INR
INR: 0.96 (ref 0.00–1.49)
Prothrombin Time: 12.7 seconds (ref 11.6–15.2)

## 2012-03-03 LAB — CBC
HCT: 47.1 % (ref 39.0–52.0)
Hemoglobin: 15.6 g/dL (ref 13.0–17.0)
MCH: 31.8 pg (ref 26.0–34.0)
MCHC: 33.1 g/dL (ref 30.0–36.0)
RBC: 4.91 MIL/uL (ref 4.22–5.81)

## 2012-03-03 LAB — SURGICAL PCR SCREEN: Staphylococcus aureus: NEGATIVE

## 2012-03-03 LAB — TYPE AND SCREEN: ABO/RH(D): A POS

## 2012-03-03 NOTE — Pre-Procedure Instructions (Signed)
[address] Leonard Berg  03/03/2012   Your procedure is scheduled on:  Tuesday, December 3rd  Report to [facility] at 0730 AM.  Call this number if you have problems the morning of surgery: [phone]   Remember:   Do not eat food or drink:After Midnight.   Take these medicines the morning of surgery with A SIP OF WATER: toprol, synthroid, flonase, zantac, flomax   Do not wear jewelry, make-up or nail polish.  Do not wear lotions, powders, or perfumes.  Do not shave 48 hours prior to surgery. Men may shave face and neck.  Do not bring valuables to the hospital.  Contacts, dentures or bridgework may not be worn into surgery.  Leave suitcase in the car. After surgery it may be brought to your room.  For patients admitted to the hospital, checkout time is 11:00 AM the day of discharge.   Patients discharged the day of surgery will not be allowed to drive home.    Special Instructions: Shower using CHG 2 nights before surgery and the night before surgery.  If you shower the day of surgery use CHG.  Use special wash - you have one bottle of CHG for all showers.  You should use approximately 1/3 of the bottle for each shower.   Please read over the following fact sheets that you were given: Pain Booklet, Coughing and Deep Breathing, Blood Transfusion Information, MRSA Information and Surgical Site Infection Prevention

## 2012-03-14 MED ORDER — DEXTROSE 5 % IV SOLN
1.5000 g | INTRAVENOUS | Status: DC
  Filled 2012-03-14: qty 1.5

## 2012-03-15 ENCOUNTER — Encounter (HOSPITAL_COMMUNITY): Payer: Self-pay | Admitting: *Deleted

## 2012-03-15 ENCOUNTER — Encounter (HOSPITAL_COMMUNITY)
Admission: RE | Disposition: A | Discharge status: Home / Self Care | Payer: Self-pay | Source: Ambulatory Visit | Attending: Vascular Surgery

## 2012-03-15 ENCOUNTER — Inpatient Hospital Stay (HOSPITAL_COMMUNITY): Payer: Medicare Other | Admitting: *Deleted

## 2012-03-15 ENCOUNTER — Telehealth: Payer: Self-pay | Admitting: Vascular Surgery

## 2012-03-15 ENCOUNTER — Inpatient Hospital Stay (HOSPITAL_COMMUNITY)
Admission: RE | Admit: 2012-03-15 | Discharge: 2012-03-16 | DRG: 039 | Disposition: A | Discharge status: Home / Self Care | Payer: Medicare Other | Source: Ambulatory Visit | Attending: Vascular Surgery | Admitting: Vascular Surgery

## 2012-03-15 DIAGNOSIS — I6529 Occlusion and stenosis of unspecified carotid artery: Secondary | ICD-10-CM

## 2012-03-15 DIAGNOSIS — M1990 Unspecified osteoarthritis, unspecified site: Secondary | ICD-10-CM | POA: Diagnosis present

## 2012-03-15 DIAGNOSIS — Z86711 Personal history of pulmonary embolism: Secondary | ICD-10-CM

## 2012-03-15 DIAGNOSIS — I658 Occlusion and stenosis of other precerebral arteries: Secondary | ICD-10-CM | POA: Diagnosis present

## 2012-03-15 DIAGNOSIS — I2510 Atherosclerotic heart disease of native coronary artery without angina pectoris: Secondary | ICD-10-CM | POA: Diagnosis present

## 2012-03-15 DIAGNOSIS — Z951 Presence of aortocoronary bypass graft: Secondary | ICD-10-CM

## 2012-03-15 DIAGNOSIS — I10 Essential (primary) hypertension: Secondary | ICD-10-CM

## 2012-03-15 DIAGNOSIS — M549 Dorsalgia, unspecified: Secondary | ICD-10-CM | POA: Diagnosis present

## 2012-03-15 DIAGNOSIS — Z86718 Personal history of other venous thrombosis and embolism: Secondary | ICD-10-CM

## 2012-03-15 DIAGNOSIS — E039 Hypothyroidism, unspecified: Secondary | ICD-10-CM | POA: Diagnosis present

## 2012-03-15 DIAGNOSIS — E785 Hyperlipidemia, unspecified: Secondary | ICD-10-CM | POA: Diagnosis present

## 2012-03-15 DIAGNOSIS — Z8249 Family history of ischemic heart disease and other diseases of the circulatory system: Secondary | ICD-10-CM

## 2012-03-15 DIAGNOSIS — Z79899 Other long term (current) drug therapy: Secondary | ICD-10-CM

## 2012-03-15 DIAGNOSIS — G8929 Other chronic pain: Secondary | ICD-10-CM | POA: Diagnosis present

## 2012-03-15 DIAGNOSIS — N400 Enlarged prostate without lower urinary tract symptoms: Secondary | ICD-10-CM | POA: Diagnosis present

## 2012-03-15 DIAGNOSIS — Z7982 Long term (current) use of aspirin: Secondary | ICD-10-CM

## 2012-03-15 DIAGNOSIS — N289 Disorder of kidney and ureter, unspecified: Secondary | ICD-10-CM | POA: Diagnosis present

## 2012-03-15 DIAGNOSIS — I4891 Unspecified atrial fibrillation: Secondary | ICD-10-CM | POA: Diagnosis present

## 2012-03-15 DIAGNOSIS — Z87891 Personal history of nicotine dependence: Secondary | ICD-10-CM

## 2012-03-15 DIAGNOSIS — I4510 Unspecified right bundle-branch block: Secondary | ICD-10-CM | POA: Diagnosis present

## 2012-03-15 HISTORY — PX: ENDARTERECTOMY: SHX5162

## 2012-03-15 SURGERY — ENDARTERECTOMY, CAROTID
Anesthesia: General | Site: Neck | Laterality: Left | Wound class: Clean

## 2012-03-15 MED ORDER — VECURONIUM BROMIDE 10 MG IV SOLR
INTRAVENOUS | Status: DC | PRN
  Administered 2012-03-15: 2 mg via INTRAVENOUS
  Administered 2012-03-15: 3 mg via INTRAVENOUS

## 2012-03-15 MED ORDER — SODIUM CHLORIDE 0.9 % IV SOLN
500.0000 mL | Freq: Once | INTRAVENOUS | Status: AC | PRN
  Administered 2012-03-15: 500 mL via INTRAVENOUS

## 2012-03-15 MED ORDER — ASPIRIN EC 325 MG PO TBEC
325.0000 mg | DELAYED_RELEASE_TABLET | Freq: Every day | ORAL | Status: DC
  Administered 2012-03-16: 325 mg via ORAL
  Filled 2012-03-15: qty 1

## 2012-03-15 MED ORDER — LIDOCAINE HCL (PF) 1 % IJ SOLN
INTRAMUSCULAR | Status: AC
  Filled 2012-03-15: qty 30

## 2012-03-15 MED ORDER — METOPROLOL SUCCINATE ER 25 MG PO TB24
25.0000 mg | ORAL_TABLET | Freq: Every day | ORAL | Status: DC
  Filled 2012-03-15: qty 1

## 2012-03-15 MED ORDER — LABETALOL HCL 5 MG/ML IV SOLN
INTRAVENOUS | Status: DC | PRN
  Administered 2012-03-15 (×2): 5 mg via INTRAVENOUS

## 2012-03-15 MED ORDER — LABETALOL HCL 5 MG/ML IV SOLN: 10.0000 mg | INTRAVENOUS | Status: DC | PRN

## 2012-03-15 MED ORDER — ESMOLOL HCL 10 MG/ML IV SOLN
INTRAVENOUS | Status: DC | PRN
  Administered 2012-03-15 (×2): 10 mg via INTRAVENOUS

## 2012-03-15 MED ORDER — DOCUSATE SODIUM 100 MG PO CAPS
100.0000 mg | ORAL_CAPSULE | Freq: Every day | ORAL | Status: DC
  Administered 2012-03-16: 100 mg via ORAL
  Filled 2012-03-15: qty 1

## 2012-03-15 MED ORDER — METOPROLOL TARTRATE 1 MG/ML IV SOLN: 2.0000 mg | INTRAVENOUS | Status: DC | PRN

## 2012-03-15 MED ORDER — ONDANSETRON HCL 4 MG/2ML IJ SOLN
INTRAMUSCULAR | Status: DC | PRN
  Administered 2012-03-15: 4 mg via INTRAVENOUS

## 2012-03-15 MED ORDER — GLYCOPYRROLATE 0.2 MG/ML IJ SOLN
INTRAMUSCULAR | Status: DC | PRN
  Administered 2012-03-15: .8 mg via INTRAVENOUS

## 2012-03-15 MED ORDER — NEOSTIGMINE METHYLSULFATE 1 MG/ML IJ SOLN
INTRAMUSCULAR | Status: DC | PRN
  Administered 2012-03-15: 5 mg via INTRAVENOUS

## 2012-03-15 MED ORDER — HYDRALAZINE HCL 20 MG/ML IJ SOLN: 10.0000 mg | INTRAMUSCULAR | Status: DC | PRN

## 2012-03-15 MED ORDER — SODIUM CHLORIDE 0.9 % IV SOLN
INTRAVENOUS | Status: DC
  Administered 2012-03-15 – 2012-03-16 (×2): via INTRAVENOUS

## 2012-03-15 MED ORDER — OXYCODONE-ACETAMINOPHEN 5-325 MG PO TABS
1.0000 | ORAL_TABLET | ORAL | Status: DC | PRN
  Administered 2012-03-15: 1 via ORAL
  Filled 2012-03-15: qty 1

## 2012-03-15 MED ORDER — HEPARIN SODIUM (PORCINE) 1000 UNIT/ML IJ SOLN
INTRAMUSCULAR | Status: DC | PRN
  Administered 2012-03-15 (×2): 5000 [IU] via INTRAVENOUS

## 2012-03-15 MED ORDER — ROCURONIUM BROMIDE 100 MG/10ML IV SOLN
INTRAVENOUS | Status: DC | PRN
  Administered 2012-03-15: 50 mg via INTRAVENOUS

## 2012-03-15 MED ORDER — OXYCODONE-ACETAMINOPHEN 5-325 MG PO TABS: 1.0000 | ORAL_TABLET | ORAL | Status: DC | PRN

## 2012-03-15 MED ORDER — DEXTROSE 5 % IV SOLN
1.5000 g | Freq: Two times a day (BID) | INTRAVENOUS | Status: AC
  Administered 2012-03-15 – 2012-03-16 (×2): 1.5 g via INTRAVENOUS
  Filled 2012-03-15 (×2): qty 1.5

## 2012-03-15 MED ORDER — FLUTICASONE PROPIONATE 50 MCG/ACT NA SUSP
2.0000 | NASAL | Status: DC | PRN
  Filled 2012-03-15 (×2): qty 16

## 2012-03-15 MED ORDER — ONDANSETRON HCL 4 MG/2ML IJ SOLN
INTRAMUSCULAR | Status: AC
  Filled 2012-03-15: qty 2

## 2012-03-15 MED ORDER — MIDAZOLAM HCL 2 MG/2ML IJ SOLN
INTRAMUSCULAR | Status: AC
  Filled 2012-03-15: qty 2

## 2012-03-15 MED ORDER — DOPAMINE-DEXTROSE 3.2-5 MG/ML-% IV SOLN
INTRAVENOUS | Status: AC
  Filled 2012-03-15: qty 250

## 2012-03-15 MED ORDER — ACETAMINOPHEN 325 MG PO TABS: 325.0000 mg | ORAL_TABLET | ORAL | Status: DC | PRN

## 2012-03-15 MED ORDER — PROPOFOL 10 MG/ML IV BOLUS
INTRAVENOUS | Status: DC | PRN
  Administered 2012-03-15: 100 mg via INTRAVENOUS
  Administered 2012-03-15: 20 mg via INTRAVENOUS

## 2012-03-15 MED ORDER — FENTANYL CITRATE 0.05 MG/ML IJ SOLN
INTRAMUSCULAR | Status: DC | PRN
  Administered 2012-03-15 (×2): 50 ug via INTRAVENOUS
  Administered 2012-03-15: 150 ug via INTRAVENOUS

## 2012-03-15 MED ORDER — DOPAMINE-DEXTROSE 3.2-5 MG/ML-% IV SOLN
3.0000 ug/kg/min | INTRAVENOUS | Status: DC
  Administered 2012-03-15: 10 ug/kg/min via INTRAVENOUS

## 2012-03-15 MED ORDER — EPHEDRINE SULFATE 50 MG/ML IJ SOLN
INTRAMUSCULAR | Status: DC | PRN
  Administered 2012-03-15: 10 mg via INTRAVENOUS

## 2012-03-15 MED ORDER — ACETAMINOPHEN 650 MG RE SUPP
325.0000 mg | RECTAL | Status: DC | PRN
  Administered 2012-03-15: 650 mg via RECTAL
  Filled 2012-03-15: qty 1

## 2012-03-15 MED ORDER — ONDANSETRON HCL 4 MG/2ML IJ SOLN
4.0000 mg | Freq: Four times a day (QID) | INTRAMUSCULAR | Status: DC | PRN
  Administered 2012-03-15 – 2012-03-16 (×2): 4 mg via INTRAVENOUS
  Filled 2012-03-15: qty 2

## 2012-03-15 MED ORDER — ALBUTEROL SULFATE HFA 108 (90 BASE) MCG/ACT IN AERS
INHALATION_SPRAY | RESPIRATORY_TRACT | Status: DC | PRN
  Administered 2012-03-15: 3 via RESPIRATORY_TRACT

## 2012-03-15 MED ORDER — LACTATED RINGERS IV SOLN
INTRAVENOUS | Status: DC | PRN
  Administered 2012-03-15 (×3): via INTRAVENOUS

## 2012-03-15 MED ORDER — FAMOTIDINE 20 MG PO TABS
20.0000 mg | ORAL_TABLET | Freq: Every day | ORAL | Status: DC | PRN
  Filled 2012-03-15: qty 1

## 2012-03-15 MED ORDER — THROMBIN 20000 UNITS EX SOLR
CUTANEOUS | Status: AC
  Filled 2012-03-15: qty 20000

## 2012-03-15 MED ORDER — PROTAMINE SULFATE 10 MG/ML IV SOLN
INTRAVENOUS | Status: DC | PRN
  Administered 2012-03-15: 40 mg via INTRAVENOUS

## 2012-03-15 MED ORDER — ALUM & MAG HYDROXIDE-SIMETH 200-200-20 MG/5ML PO SUSP: 15.0000 mL | ORAL | Status: DC | PRN

## 2012-03-15 MED ORDER — LIDOCAINE-EPINEPHRINE (PF) 1 %-1:200000 IJ SOLN
INTRAMUSCULAR | Status: AC
  Filled 2012-03-15: qty 10

## 2012-03-15 MED ORDER — SODIUM CHLORIDE 0.9 % IV SOLN: INTRAVENOUS | Status: DC

## 2012-03-15 MED ORDER — DEXTRAN 40 IN SALINE 10-0.9 % IV SOLN
INTRAVENOUS | Status: AC
  Filled 2012-03-15: qty 500

## 2012-03-15 MED ORDER — SODIUM CHLORIDE 0.9 % IR SOLN
Status: DC | PRN
  Administered 2012-03-15: 12:00:00

## 2012-03-15 MED ORDER — ATORVASTATIN CALCIUM 20 MG PO TABS
20.0000 mg | ORAL_TABLET | Freq: Every day | ORAL | Status: DC
  Filled 2012-03-15: qty 1

## 2012-03-15 MED ORDER — DOPAMINE-DEXTROSE 3.2-5 MG/ML-% IV SOLN
3.0000 ug/kg/min | INTRAVENOUS | Status: DC
  Administered 2012-03-15: 5 ug/kg/min via INTRAVENOUS

## 2012-03-15 MED ORDER — PHENYLEPHRINE HCL 10 MG/ML IJ SOLN
10.0000 mg | INTRAVENOUS | Status: DC | PRN
  Administered 2012-03-15: 50 ug/min via INTRAVENOUS
  Administered 2012-03-15: 13:00:00 via INTRAVENOUS

## 2012-03-15 MED ORDER — LACTATED RINGERS IV SOLN
INTRAVENOUS | Status: DC
  Administered 2012-03-15: 10:00:00 via INTRAVENOUS

## 2012-03-15 MED ORDER — MORPHINE SULFATE 2 MG/ML IJ SOLN: 2.0000 mg | INTRAMUSCULAR | Status: DC | PRN

## 2012-03-15 MED ORDER — TAMSULOSIN HCL 0.4 MG PO CAPS
0.4000 mg | ORAL_CAPSULE | Freq: Every day | ORAL | Status: DC
  Administered 2012-03-16: 0.4 mg via ORAL
  Filled 2012-03-15 (×2): qty 1

## 2012-03-15 MED ORDER — SENNOSIDES-DOCUSATE SODIUM 8.6-50 MG PO TABS
1.0000 | ORAL_TABLET | Freq: Every evening | ORAL | Status: DC | PRN
  Filled 2012-03-15: qty 1

## 2012-03-15 MED ORDER — ASPIRIN 81 MG PO TABS: 81.0000 mg | ORAL_TABLET | Freq: Every day | ORAL | Status: DC

## 2012-03-15 MED ORDER — ARTIFICIAL TEARS OP OINT
TOPICAL_OINTMENT | OPHTHALMIC | Status: DC | PRN
  Administered 2012-03-15: 1 via OPHTHALMIC

## 2012-03-15 MED ORDER — METOPROLOL SUCCINATE ER 25 MG PO TB24: 25.0000 mg | ORAL_TABLET | Freq: Every day | ORAL | Status: DC

## 2012-03-15 MED ORDER — PANTOPRAZOLE SODIUM 40 MG PO TBEC
40.0000 mg | DELAYED_RELEASE_TABLET | Freq: Every day | ORAL | Status: DC
  Administered 2012-03-16: 40 mg via ORAL
  Filled 2012-03-15: qty 1

## 2012-03-15 MED ORDER — GUAIFENESIN-DM 100-10 MG/5ML PO SYRP: 15.0000 mL | ORAL_SOLUTION | ORAL | Status: DC | PRN

## 2012-03-15 MED ORDER — LIDOCAINE HCL (CARDIAC) 20 MG/ML IV SOLN
INTRAVENOUS | Status: DC | PRN
  Administered 2012-03-15: 100 mg via INTRAVENOUS

## 2012-03-15 MED ORDER — MAGNESIUM SULFATE 40 MG/ML IJ SOLN
2.0000 g | Freq: Once | INTRAMUSCULAR | Status: AC | PRN
  Filled 2012-03-15: qty 50

## 2012-03-15 MED ORDER — LIDOCAINE-EPINEPHRINE (PF) 1 %-1:200000 IJ SOLN
INTRAMUSCULAR | Status: DC | PRN
  Administered 2012-03-15: 30 mL

## 2012-03-15 MED ORDER — MIDAZOLAM HCL 5 MG/5ML IJ SOLN
INTRAMUSCULAR | Status: DC | PRN
  Administered 2012-03-15: 0.5 mg via INTRAVENOUS
  Administered 2012-03-15: 1 mg via INTRAVENOUS

## 2012-03-15 MED ORDER — DEXTROSE 5 % IV SOLN
1.5000 g | INTRAVENOUS | Status: DC | PRN
  Administered 2012-03-15: 1.5 g via INTRAVENOUS

## 2012-03-15 MED ORDER — LEVOTHYROXINE SODIUM 125 MCG PO TABS
125.0000 ug | ORAL_TABLET | Freq: Every day | ORAL | Status: DC
  Administered 2012-03-16: 125 ug via ORAL
  Filled 2012-03-15 (×2): qty 1

## 2012-03-15 MED ORDER — PHENOL 1.4 % MT LIQD
1.0000 | OROMUCOSAL | Status: DC | PRN
  Administered 2012-03-15: 1 via OROMUCOSAL
  Filled 2012-03-15: qty 177

## 2012-03-15 MED ORDER — DEXTRAN 40 IN SALINE 10-0.9 % IV SOLN
INTRAVENOUS | Status: DC | PRN
  Administered 2012-03-15: 1039 mL

## 2012-03-15 MED ORDER — BISACODYL 10 MG RE SUPP: 10.0000 mg | Freq: Every day | RECTAL | Status: DC | PRN

## 2012-03-15 MED ORDER — 0.9 % SODIUM CHLORIDE (POUR BTL) OPTIME
TOPICAL | Status: DC | PRN
  Administered 2012-03-15: 2000 mL

## 2012-03-15 MED ORDER — POTASSIUM CHLORIDE CRYS ER 20 MEQ PO TBCR: 20.0000 meq | EXTENDED_RELEASE_TABLET | Freq: Once | ORAL | Status: AC | PRN

## 2012-03-15 SURGICAL SUPPLY — 58 items
ADH SKN CLS APL DERMABOND .7 (GAUZE/BANDAGES/DRESSINGS) ×1
BAG DECANTER FOR FLEXI CONT (MISCELLANEOUS) ×2 IMPLANT
CANISTER SUCTION 2500CC (MISCELLANEOUS) ×2 IMPLANT
CANNULA VESSEL W/WING WO/VALVE (CANNULA) ×2 IMPLANT
CATH ROBINSON RED A/P 18FR (CATHETERS) ×2 IMPLANT
CLIP TI MEDIUM 24 (CLIP) ×2 IMPLANT
CLIP TI WIDE RED SMALL 24 (CLIP) ×2 IMPLANT
CLOTH BEACON ORANGE TIMEOUT ST (SAFETY) ×2 IMPLANT
COVER SURGICAL LIGHT HANDLE (MISCELLANEOUS) ×2 IMPLANT
CRADLE DONUT ADULT HEAD (MISCELLANEOUS) ×2 IMPLANT
DERMABOND ADVANCED (GAUZE/BANDAGES/DRESSINGS) ×1
DERMABOND ADVANCED .7 DNX12 (GAUZE/BANDAGES/DRESSINGS) ×1 IMPLANT
DRAIN CHANNEL 15F RND FF W/TCR (WOUND CARE) IMPLANT
DRAPE WARM FLUID 44X44 (DRAPE) ×2 IMPLANT
ELECT REM PT RETURN 9FT ADLT (ELECTROSURGICAL) ×2
ELECTRODE REM PT RTRN 9FT ADLT (ELECTROSURGICAL) ×1 IMPLANT
EVACUATOR SILICONE 100CC (DRAIN) IMPLANT
GLOVE BIO SURGEON STRL SZ7.5 (GLOVE) ×2 IMPLANT
GLOVE BIOGEL PI IND STRL 7.0 (GLOVE) IMPLANT
GLOVE BIOGEL PI IND STRL 7.5 (GLOVE) IMPLANT
GLOVE BIOGEL PI IND STRL 8 (GLOVE) ×1 IMPLANT
GLOVE BIOGEL PI INDICATOR 7.0 (GLOVE) ×2
GLOVE BIOGEL PI INDICATOR 7.5 (GLOVE) ×3
GLOVE BIOGEL PI INDICATOR 8 (GLOVE) ×1
GLOVE SS BIOGEL STRL SZ 7 (GLOVE) IMPLANT
GLOVE SUPERSENSE BIOGEL SZ 7 (GLOVE) ×1
GLOVE SURG SS PI 7.0 STRL IVOR (GLOVE) ×1 IMPLANT
GLOVE SURG SS PI 7.5 STRL IVOR (GLOVE) ×1 IMPLANT
GOWN BRE IMP SLV AUR XL STRL (GOWN DISPOSABLE) ×1 IMPLANT
GOWN SRG XL XLNG 56XLVL 4 (GOWN DISPOSABLE) IMPLANT
GOWN STRL NON-REIN LRG LVL3 (GOWN DISPOSABLE) ×4 IMPLANT
GOWN STRL NON-REIN XL XLG LVL4 (GOWN DISPOSABLE) ×2
KIT BASIN OR (CUSTOM PROCEDURE TRAY) ×2 IMPLANT
KIT ROOM TURNOVER OR (KITS) ×2 IMPLANT
NDL HYPO 25X1 1.5 SAFETY (NEEDLE) ×1 IMPLANT
NEEDLE 22X1 1/2 (OR ONLY) (NEEDLE) ×1 IMPLANT
NEEDLE HYPO 25X1 1.5 SAFETY (NEEDLE) ×2 IMPLANT
NS IRRIG 1000ML POUR BTL (IV SOLUTION) ×4 IMPLANT
PACK CAROTID (CUSTOM PROCEDURE TRAY) ×2 IMPLANT
PAD ARMBOARD 7.5X6 YLW CONV (MISCELLANEOUS) ×4 IMPLANT
PATCH HEMASHIELD 8X75 (Vascular Products) ×1 IMPLANT
SHUNT CAROTID BYPASS 10 (VASCULAR PRODUCTS) IMPLANT
SHUNT CAROTID BYPASS 12 (VASCULAR PRODUCTS) ×1 IMPLANT
SHUNT CAROTID BYPASS 12FRX15.5 (VASCULAR PRODUCTS) IMPLANT
SPECIMEN JAR SMALL (MISCELLANEOUS) ×2 IMPLANT
SPONGE SURGIFOAM ABS GEL 100 (HEMOSTASIS) IMPLANT
SUT PROLENE 6 0 BV (SUTURE) ×3 IMPLANT
SUT PROLENE 7 0 BV 1 (SUTURE) IMPLANT
SUT SILK 2 0 FS (SUTURE) IMPLANT
SUT SILK 3 0 (SUTURE) ×2
SUT SILK 3-0 18XBRD TIE 12 (SUTURE) IMPLANT
SUT VIC AB 3-0 SH 27 (SUTURE) ×2
SUT VIC AB 3-0 SH 27X BRD (SUTURE) ×1 IMPLANT
SUT VICRYL 4-0 PS2 18IN ABS (SUTURE) ×2 IMPLANT
SYR CONTROL 10ML LL (SYRINGE) ×4 IMPLANT
TOWEL OR 17X24 6PK STRL BLUE (TOWEL DISPOSABLE) ×3 IMPLANT
TOWEL OR 17X26 10 PK STRL BLUE (TOWEL DISPOSABLE) ×2 IMPLANT
WATER STERILE IRR 1000ML POUR (IV SOLUTION) ×2 IMPLANT

## 2012-03-15 NOTE — H&P (View-Only) (Signed)
[facility]  Patient name: Leonard Berg MRN: [id number] DOB: 19-Apr-1929 Sex: male  CC: asymptomatic greater than 80% left carotid stenosis.  HPI: Leonard Berg is a 78 y.o. male who had presented initially with an episode of amaurosis fugax in the right eye. Duplex scan showed bilateral greater than 80% carotid stenoses. He underwent right carotid endarterectomy first given that the side was symptomatic. The dissection was very high and he had a large posterior pharyngeal branch making dissection difficult however he did well postoperatively and was discharged on postoperative day #1. He denies any problems with swallowing or hoarseness. He denies any focal weakness or paresthesias. He has no specific complaints today.  Past Medical History  Diagnosis Date  . Hypertension   . Hyperlipidemia   . Pulmonary embolism 2009    hx of  . DJD (degenerative joint disease)   . Arrhythmia     PAROXYSMAL ATRIAL FIBRILLATION  . RBBB (right bundle branch block)   . Unstable angina   . Arthritis     OSTEOARTHRITIS  . Anemia   . Thrombocytopenia   . History of BPH   . Thyroid disease     HYPOTHYROIDISM  . DVT (deep venous thrombosis)   . Coronary artery disease 8/11    CABG...LM EMERGENT WITH IABP sees Dr. Casimiro Needle cooper  . Chronic back pain   . Cataracts, bilateral   . Renal artery stenosis     Family History  Problem Relation Age of Onset  . Lung cancer Father 14    deceased   . Cancer Father 61    LUNG  . Stroke Mother 66    DECEASED   . Hyperlipidemia Mother   . Heart attack Brother 59    deceased  . Diabetes Brother   . Liver cancer Sister 39    deceased   . Cancer Sister     SOCIAL HISTORY: History  Substance Use Topics  . Smoking status: Former Smoker    Quit date: 04/13/1974  . Smokeless tobacco: Former Neurosurgeon    Quit date: 01/26/2002     Comment: He has smoked about 27-pack-year hx   . Alcohol Use: 1.2 oz/week    2 Cans of  beer per week     Comment: He used to drink more heavily , but rarely drinks at the current time    No Known Allergies  Current Outpatient Prescriptions  Medication Sig Dispense Refill  . aspirin 81 MG tablet Take 81 mg by mouth daily.        . fluticasone (FLONASE) 50 MCG/ACT nasal spray Place 2 sprays into the nose as needed.      Marland Kitchen levothyroxine (SYNTHROID, LEVOTHROID) 125 MCG tablet Take 125 mcg by mouth daily.        . metoprolol succinate (TOPROL XL) 25 MG 24 hr tablet Take 1 tablet (25 mg total) by mouth daily.  30 tablet  11  . Multiple Vitamins-Minerals (CENTRUM SILVER PO) Take 1 tablet by mouth daily.        . Omega-3 Fatty Acids (FISH OIL) 1000 MG CAPS Take 2 capsules by mouth daily.        . Ranitidine HCl (ZANTAC 75 PO) Take by mouth daily.      . rosuvastatin (CRESTOR) 10 MG tablet Take 1 tablet (10 mg total) by mouth at bedtime.  30 tablet  11  . Tamsulosin HCl (FLOMAX) 0.4 MG CAPS Take 0.4 mg by mouth daily.        Marland Kitchen  oxyCODONE-acetaminophen (ROXICET) 5-325 MG per tablet Take 1 tablet by mouth every 4 (four) hours as needed for pain.  30 tablet  0    REVIEW OF SYSTEMS: Arly.Keller ] denotes positive finding; [  ] denotes negative finding CARDIOVASCULAR:  [ ]  chest pain   [ ]  chest pressure   [ ]  palpitations   [ ]  orthopnea   Arly.Keller ] dyspnea on exertion   [ ]  claudication   [ ]  rest pain   [ ]  DVT   [ ]  phlebitis PULMONARY:   [ ]  productive cough   [ ]  asthma   [ ]  wheezing NEUROLOGIC:   [ ]  weakness  [ ]  paresthesias  [ ]  aphasia  [ ]  amaurosis  [ ]  dizziness HEMATOLOGIC:   [ ]  bleeding problems   [ ]  clotting disorders MUSCULOSKELETAL:  [ ]  joint pain   [ ]  joint swelling [ ]  leg swelling GASTROINTESTINAL: [ ]   blood in stool  [ ]   hematemesis GENITOURINARY:  [ ]   dysuria  [ ]   hematuria PSYCHIATRIC:  [ ]  history of major depression INTEGUMENTARY:  [ ]  rashes  [ ]  ulcers CONSTITUTIONAL:  [ ]  fever   [ ]  chills  PHYSICAL EXAM: Filed Vitals:   02/17/12 0838 02/17/12 0840  BP:  142/71 122/75  Pulse: 74   Temp: 98.3 F (36.8 C)   TempSrc: Oral   Height: 6\' 3"  (1.905 m)   Weight: 231 lb 6.4 oz (104.962 kg)   SpO2: 98%    Body mass index is 28.92 kg/(m^2). GENERAL: The patient is a well-nourished male, in no acute distress. The vital signs are documented above. CARDIOVASCULAR: There is a regular rate and rhythm without significant murmur appreciated. He has a left carotid bruit. He has palpable femoral pulses. I cannot palpate pedal pulses. PULMONARY: There is good air exchange bilaterally without wheezing or rales. ABDOMEN: Soft and non-tender with normal pitched bowel sounds. I do not palpate an abdominal aortic aneurysm. MUSCULOSKELETAL: There are no major deformities or cyanosis. NEUROLOGIC: No focal weakness or paresthesias are detected. SKIN: There are no ulcers or rashes noted. PSYCHIATRIC: The patient has a normal affect.  DATA:  Previous duplex scan showed a greater than 80% left carotid stenosis. This was confirmed by CT angiogram.  MEDICAL ISSUES:  Occlusion and stenosis of carotid artery without mention of cerebral infarction This patient had presented with bilateral greater than 80% carotid stenoses. He had an episode of amaurosis fugax in the right eye and therefore the right carotid stenosis was addressed first. He underwent right carotid endarterectomy with primary closure on 01/28/2012. Of note the dissection was very high and we had to clamp well above the hypoglossal nerve with significant retraction on the hypoglossal nerve. Or he has done well since surgery with no hoarseness or difficulty swallowing. I think he will be ready for staged left carotid endarterectomy in early December. We have scheduled his left carotid endarterectomy for 03/15/2012.I have reviewed the indications for carotid endarterectomy, that is to lower the risk of future stroke. I have also reviewed the potential complications of surgery, including but not limited to: bleeding,  stroke (perioperative risk 1-2%), MI, nerve injury of other unpredictable medical problems. All of the patients questions were answered and they are agreeable to proceed with surgery. He will continue his aspirin right up through surgery.    DICKSON,CHRISTOPHER S [facility] [place] Office: [phone]

## 2012-03-15 NOTE — Anesthesia Procedure Notes (Signed)
Procedure Name: Intubation Performed by: Everlene Balls TODD Pre-anesthesia Checklist: Suction available, Emergency Drugs available, Patient being monitored, Timeout performed and Patient identified Oxygen Delivery Method: Circle system utilized Preoxygenation: Pre-oxygenation with 100% oxygen Intubation Type: IV induction Ventilation: Oral airway inserted - appropriate to patient size Laryngoscope Size: Mac and 4 Grade View: Grade I Tube type: Oral Tube size: 7.5 mm Number of attempts: 1 Airway Equipment and Method: Stylet Placement Confirmation: ETT inserted through vocal cords under direct vision,  positive ETCO2 and breath sounds checked- equal and bilateral Secured at: 23 cm Tube secured with: Tape Dental Injury: Teeth and Oropharynx as per pre-operative assessment

## 2012-03-15 NOTE — Anesthesia Postprocedure Evaluation (Signed)
Anesthesia Post Note  Patient: Leonard Berg  Procedure(s) Performed: Procedure(s) (LRB): ENDARTERECTOMY CAROTID (Left)  Anesthesia type: general  Patient location: PACU  Post pain: Pain level controlled  Post assessment: Patient's Cardiovascular Status Stable  Last Vitals:  Filed Vitals:   03/15/12 1445  BP: 109/46  Pulse:   Temp:   Resp:     Post vital signs: Reviewed and stable  Level of consciousness: sedated  Complications: No apparent anesthesia complications

## 2012-03-15 NOTE — Progress Notes (Signed)
VASCULAR PROGRESS NOTE  SUBJECTIVE: Comfortable  PHYSICAL EXAM: Filed Vitals:   03/15/12 1605 03/15/12 1615 03/15/12 1625 03/15/12 1630  BP: 109/86 89/37 87/37  97/55  Pulse: 56 51 49 45  Temp:      TempSrc:      Resp: 16 10 16 20   Height:      Weight:      SpO2: 95% 96% 95% 93%   Neuro intact Incision looks fine.  LABS: Lab Results  Component Value Date   WBC 7.9 03/03/2012   HGB 15.6 03/03/2012   HCT 47.1 03/03/2012   MCV 95.9 03/03/2012   PLT 165 03/03/2012   ASSESSMENT/PLAN: 1.Doing well S/P left CEA. 2. On dopamine for BP and receiving fluid bolus.   Cari Caraway Beeper: [phone] 03/15/2012

## 2012-03-15 NOTE — Op Note (Signed)
NAME: Leonard Berg   MRN: [id number] DOB: 08/07/1929    DATE OF OPERATION: 03/15/2012  PREOP DIAGNOSIS: asymptomatic greater than 80% left carotid stenosis  POSTOP DIAGNOSIS: same  PROCEDURE: left carotid endarterectomy with Dacron patch angioplasty  SURGEON: Di Kindle. Edilia Bo, MD, FACS  ASSIST: Lianne Cure PA  ANESTHESIA: Gen.   EBL: minimal  INDICATIONS: Leonard Berg is a 78 y.o. male who presented with a symptomatic right carotid stenosis and was found have bilateral severe carotid disease. He underwent right carotid endarterectomy and now presents for elective left carotid endarterectomy.  FINDINGS: the bifurcation was high and the plaque extended very high. I had to work well above the hypoglossal nerve.  TECHNIQUE: The patient was brought to the artery and received a general anesthetic. Arterial line had been placed by anesthesia. The patient had limited mobility of his neck. The left neck was prepped and draped in the usual sterile fashion. An incision was made along the anterior border of the sternocleidomastoid and the dissection carried down to the common carotid artery which was dissected free and controlled with Rummel tourniquet. Visual vein was divided between 2-0 silk ties. The plaque extended very high and had to fully mobilize the hypoglossal nerve and divide the ansa cervicalis in order to allow further mobilization of the hypoglossal nerve. The external carotid artery and superior thyroid artery were controlled. There was a large pharyngeal branch which actually came off of the proximal internal carotid artery which was controlled. The internal carotid artery was then controlled above the plaque. The patient was then heparinized. Clamps were then placed on the internal then the external then the common carotid artery. A longitudinal arteriotomy was made in the common carotid artery this was extended through the plaque into the internal carotid artery. A 12  shunt was placed into the internal carotid artery and back bled and then placed into the common carotid artery and secured with a Rummel tourniquet. Flow is reestablished the shunt. An endarterectomy plane was established proximally. The plaque was sharply divided. Eversion endarterectomy was performed of the external carotid artery. Distally there was a nice taper in the internal carotid artery plaque and no tacking sutures were required. The artery was irrigated with copious amounts of heparin and dextran all loose debris removed.  In order to allow better visualization distally I removed the shunt again excellent back bleeding. This allowed good visualization distally. A Dacron patch was then sewn using continuous 6-0 Prolene suture. Prior to completing the patch closure the arteries were backbled and flushed appropriately and the anastomosis completed. Flow was reestablished first to the external carotid artery and into the internal carotid artery. At the completion was an excellent pulse distal to the patch and good Doppler signal. The heparin was partially reversed with protamine. The wounds closed the deep layer with 3-0 Vicryl. The platysma was closed with running 3-0 Vicryl. The skin was closed with a 4-0 subcuticular stitch. Dermabond was applied. Patient tolerated the procedure well and awoke neurologically intact. All needle and sponge counts were correct.  Leonard Ferrari, MD, FACS [facility]  DATE OF DICTATION:   03/15/2012

## 2012-03-15 NOTE — Interval H&P Note (Signed)
History and Physical Interval Note:  03/15/2012 10:51 AM  Leonard Berg  has presented today for surgery, with the diagnosis of LEFT ICA STENOSIS  The various methods of treatment have been discussed with the patient and family. After consideration of risks, benefits and other options for treatment, the patient has consented to  Procedure(s) (LRB) with comments: ENDARTERECTOMY CAROTID (Left) as a surgical intervention .  The patient's history has been reviewed, patient examined, no change in status, stable for surgery.  I have reviewed the patient's chart and labs.  Questions were answered to the patient's satisfaction.     Deshanda Molitor S

## 2012-03-15 NOTE — Discharge Summary (Signed)
[facility] Discharge Summary   Patient ID:  Leonard Berg MRN: [id number] DOB/AGE: Jul 15, 1929 78 y.o.  Admit date: 03/15/2012 Discharge date: 03/16/2012 Date of Surgery: 03/15/2012 Surgeon: Surgeon(s): Chuck Hint, MD  Admission Diagnosis: LEFT ICA STENOSIS  Discharge Diagnoses:  LEFT ICA STENOSIS  Secondary Diagnoses: Past Medical History  Diagnosis Date  . Hypertension   . Hyperlipidemia   . Pulmonary embolism 2009    hx of  . DJD (degenerative joint disease)   . Arrhythmia     PAROXYSMAL ATRIAL FIBRILLATION  . RBBB (right bundle branch block)   . Unstable angina   . Arthritis     OSTEOARTHRITIS  . Anemia   . Thrombocytopenia   . History of BPH   . Thyroid disease     HYPOTHYROIDISM  . DVT (deep venous thrombosis)   . Coronary artery disease 8/11    CABG...LM EMERGENT WITH IABP sees Dr. Casimiro Needle cooper  . Chronic back pain   . Cataracts, bilateral   . Renal artery stenosis   . Hypothyroidism     Procedure(s): ENDARTERECTOMY CAROTID  Discharged Condition: good  HPI:  Leonard Berg is a 78 y.o. male who had presented initially with an episode of amaurosis fugax in the right eye. Duplex scan showed bilateral greater than 80% carotid stenoses. He underwent right carotid endarterectomy first given that the side was symptomatic. The dissection was very high and he had a large posterior pharyngeal branch making dissection difficult however he did well postoperatively and was discharged on postoperative day #1. He denies any problems with swallowing or hoarseness. He denies any focal weakness or paresthesias. He has no specific complaints today. He underwent right carotid endarterectomy with primary closure on 01/28/2012. Of note the dissection was very high and we had to clamp well above the hypoglossal nerve with significant retraction on the hypoglossal nerve. Or he has done well since surgery with no hoarseness or difficulty  swallowing. I think he will be ready for staged left carotid endarterectomy in early December. We have scheduled his left carotid endarterectomy for 03/15/2012    Hospital Course:  [name] is a 78 y.o. male is S/P Left Procedure(s): ENDARTERECTOMY CAROTID Extubated: POD # 0 Post-op wounds healing well Pt. Ambulating, voiding and taking PO diet without difficulty. Neuro exam: Slight deviation of tongue to the left.  No facial drooping. Good strength in the upper extremities and lower extremities. He has ambulated this morning.  Pt pain controlled with PO pain meds. Labs as below Complications:  Low BP and HR treated with Dopamine and fluid bolus.  Consults:     Significant Diagnostic Studies: CBC Lab Results  Component Value Date   WBC 7.9 03/03/2012   HGB 15.6 03/03/2012   HCT 47.1 03/03/2012   MCV 95.9 03/03/2012   PLT 165 03/03/2012    BMET    Component Value Date/Time   NA 137 03/03/2012 1317   K 4.1 03/03/2012 1317   CL 102 03/03/2012 1317   CO2 24 03/03/2012 1317   GLUCOSE 68* 03/03/2012 1317   BUN 15 03/03/2012 1317   CREATININE 1.27 03/03/2012 1317   CALCIUM 9.6 03/03/2012 1317   GFRNONAA 52* 03/03/2012 1317   GFRAA 60* 03/03/2012 1317   COAG Lab Results  Component Value Date   INR 0.96 03/03/2012   INR 1.01 01/27/2012   INR 0.9 07/16/2010     Disposition:  Discharge to :Home Discharge Orders    Future Appointments: Provider: Department: Dept  Phone: Center:   03/30/2012 8:45 AM Chuck Hint, MD [facility] [phone] [facility]     Future Orders Please Complete By Expires   Resume previous diet      Driving Restrictions      Comments:   No driving for 2 weeks   Lifting restrictions      Comments:   No lifting for 6 weeks   Call MD for:  temperature >100.5      Call MD for:  redness, tenderness, or signs of infection (pain, swelling, bleeding, redness, odor or green/yellow discharge around incision  site)      Call MD for:  severe or increased pain, loss or decreased feeling  in affected limb(s)         Kyair, Ditommaso  Home Medication Instructions [id number]   Printed on:03/15/12 1604  Medication Information                    levothyroxine (SYNTHROID, LEVOTHROID) 125 MCG tablet Take 125 mcg by mouth daily.             Tamsulosin HCl (FLOMAX) 0.4 MG CAPS Take 0.4 mg by mouth daily.             aspirin 81 MG tablet Take 81 mg by mouth daily.             Omega-3 Fatty Acids (FISH OIL) 1000 MG CAPS Take 2 capsules by mouth daily.             Multiple Vitamins-Minerals (CENTRUM SILVER PO) Take 1 tablet by mouth daily.             Ranitidine HCl (ZANTAC 75 PO) Take 1 tablet by mouth daily as needed. For heart burn           metoprolol succinate (TOPROL XL) 25 MG 24 hr tablet Take 1 tablet (25 mg total) by mouth daily.           fluticasone (FLONASE) 50 MCG/ACT nasal spray Place 2 sprays into the nose as needed.           rosuvastatin (CRESTOR) 10 MG tablet Take 10 mg by mouth at bedtime.           oxyCODONE-acetaminophen (ROXICET) 5-325 MG per tablet Take 1 tablet by mouth every 4 (four) hours as needed for pain.            Verbal and written Discharge instructions given to the patient. Wound care per Discharge AVS  F/U 2 weeks with Dr Edilia Bo, MD  Signed: Marlowe Shores 03/15/2012, 4:04 PM

## 2012-03-15 NOTE — Telephone Encounter (Signed)
Message copied by Fredrich Birks on Tue Mar 15, 2012  3:34 PM ------      Message from: Melene Plan      Created: Tue Mar 15, 2012  2:19 PM      Regarding: FW: charge and f/u                   ----- Message -----         From: Chuck Hint, MD         Sent: 03/15/2012   2:04 PM           To: Reuel Derby, Melene Plan, RN      Subject: charge and f/u                                           PROCEDURE: left carotid endarterectomy with Dacron patch angioplasty            SURGEON: Di Kindle. Edilia Bo, MD, FACS            ASSIST: Lianne Cure PA            He will need a follow up visit in 2 weeks. Thank you. CSD

## 2012-03-15 NOTE — Anesthesia Preprocedure Evaluation (Signed)
Anesthesia Evaluation  Patient identified by MRN, date of birth, ID band Patient awake    Airway Mallampati: II TM Distance: >3 FB Neck ROM: Full    Dental  (+) Dental Advisory Given, Edentulous Upper and Edentulous Lower   Pulmonary former smoker, PE breath sounds clear to auscultation        Cardiovascular hypertension, Pt. on medications and Pt. on home beta blockers + angina + CAD + dysrhythmias Rhythm:Regular Rate:Normal     Neuro/Psych negative neurological ROS     GI/Hepatic negative GI ROS, Neg liver ROS,   Endo/Other  Hypothyroidism   Renal/GU Renal disease     Musculoskeletal   Abdominal Normal abdominal exam  (+)   Peds  Hematology   Anesthesia Other Findings   Reproductive/Obstetrics                           Anesthesia Physical Anesthesia Plan  ASA: III  Anesthesia Plan: General   Post-op Pain Management:    Induction: Intravenous  Airway Management Planned: Oral ETT  Additional Equipment: Arterial line  Intra-op Plan:   Post-operative Plan: Extubation in OR  Informed Consent: I have reviewed the patients History and Physical, chart, labs and discussed the procedure including the risks, benefits and alternatives for the proposed anesthesia with the patient or authorized representative who has indicated his/her understanding and acceptance.   Dental advisory given  Plan Discussed with: Anesthesiologist and Surgeon  Anesthesia Plan Comments:         Anesthesia Quick Evaluation

## 2012-03-15 NOTE — Telephone Encounter (Signed)
lvm for patient re: follow up in 2 wks, dpm

## 2012-03-15 NOTE — Transfer of Care (Signed)
Immediate Anesthesia Transfer of Care Note  Patient: Leonard Berg  Procedure(s) Performed: Procedure(s) (LRB) with comments: ENDARTERECTOMY CAROTID (Left)  Patient Location: PACU  Anesthesia Type:General  Level of Consciousness: awake, oriented, patient cooperative and responds to stimulation  Airway & Oxygen Therapy: Patient Spontanous Breathing and Patient connected to face mask oxygen  Post-op Assessment: Report given to PACU RN, Post -op Vital signs reviewed and stable, Patient moving all extremities X 4 and Patient able to stick tongue midline  Post vital signs: Reviewed and stable  Complications: No apparent anesthesia complications

## 2012-03-15 NOTE — Preoperative (Signed)
Beta Blockers   Reason not to administer Beta Blockers:Not Applicable, took Toprol this AM 

## 2012-03-16 ENCOUNTER — Encounter (HOSPITAL_COMMUNITY): Payer: Self-pay | Admitting: Vascular Surgery

## 2012-03-16 LAB — BASIC METABOLIC PANEL
BUN: 20 mg/dL (ref 6–23)
CO2: 22 mEq/L (ref 19–32)
Calcium: 8.5 mg/dL (ref 8.4–10.5)
Chloride: 106 mEq/L (ref 96–112)
Creatinine, Ser: 1.31 mg/dL (ref 0.50–1.35)

## 2012-03-16 LAB — CBC
HCT: 38.9 % — ABNORMAL LOW (ref 39.0–52.0)
MCH: 31.9 pg (ref 26.0–34.0)
MCV: 95.3 fL (ref 78.0–100.0)
Platelets: 145 10*3/uL — ABNORMAL LOW (ref 150–400)
RBC: 4.08 MIL/uL — ABNORMAL LOW (ref 4.22–5.81)

## 2012-03-16 NOTE — Discharge Summary (Signed)
Agree with plans for discharge today.  Cari Caraway Beeper [phone] 03/16/2012

## 2012-03-16 NOTE — Progress Notes (Signed)
Dopamine gtt. D/c'd per MD order.  Will continue to monitor.

## 2012-03-16 NOTE — Progress Notes (Signed)
Utilization review completed- retro 

## 2012-03-16 NOTE — Progress Notes (Signed)
Patient d/c'd per MD order. [facility]VS and neuro WNL and at baseline.  Patient and wife given d/c instructions/prescriptions and all questions answered.  Patient d/c'd via volunteer with family.

## 2012-03-16 NOTE — Progress Notes (Signed)
VASCULAR PROGRESS NOTE  SUBJECTIVE: Feels slightly queasy this morning. Also notes that tongue feels funny.  PHYSICAL EXAM: Filed Vitals:   03/15/12 2300 03/16/12 0000 03/16/12 0300 03/16/12 0400  BP: 108/45 100/52 108/46 115/45  Pulse: 50 51 49 49  Temp:  97.7 F (36.5 C)  98.8 F (37.1 C)  TempSrc:  Oral  Oral  Resp: 12 7 14 14   Height:      Weight:      SpO2: 97% 98% 97% 93%   Slight deviation of tongue to the left. Good strength in the upper extremities and lower extremities. He has ambulated this morning. His incision looks fine.  LABS: Lab Results  Component Value Date   WBC 8.7 03/16/2012   HGB 13.0 03/16/2012   HCT 38.9* 03/16/2012   MCV 95.3 03/16/2012   PLT 145* 03/16/2012   Lab Results  Component Value Date   CREATININE 1.31 03/16/2012   ASSESSMENT/PLAN: 1. 1 Day Post-Op s/p: left carotid endarterectomy. This plaque extended very high and I had to work well above the hypoglossal nerve. Therefore, we had to retract significantly on the hypoglossal nerve which explains his symptoms and slight deviation of tongue to the left.  This should resolve fairly quickly in the next few days. 2. Heart rate is 50 this morning. We will hold his Toprol. He is still on dopamine for his blood pressure. 3. Plan on discharge later this morning if we are able to wean off his dopamine.  Cari Caraway Beeper: [phone] 03/16/2012

## 2012-03-29 ENCOUNTER — Encounter: Payer: Self-pay | Admitting: Vascular Surgery

## 2012-03-30 ENCOUNTER — Ambulatory Visit (INDEPENDENT_AMBULATORY_CARE_PROVIDER_SITE_OTHER): Payer: Medicare Other | Admitting: Vascular Surgery

## 2012-03-30 ENCOUNTER — Encounter: Payer: Self-pay | Admitting: Vascular Surgery

## 2012-03-30 VITALS — BP 122/65 | HR 92 | Ht 75.0 in | Wt 231.0 lb

## 2012-03-30 DIAGNOSIS — Z48812 Encounter for surgical aftercare following surgery on the circulatory system: Secondary | ICD-10-CM

## 2012-03-30 DIAGNOSIS — I6529 Occlusion and stenosis of unspecified carotid artery: Secondary | ICD-10-CM

## 2012-03-30 NOTE — Progress Notes (Signed)
[facility]  Patient name: Leonard Berg MRN: [id number] DOB: 03-Jul-1929 Sex: male  REASON FOR VISIT: follow up after left carotid endarterectomy  HPI: Leonard Berg is a 78 y.o. male he was found to have bilateral severe carotid stenoses. He underwent elective right carotid endarterectomy in most recently had a left carotid endarterectomy on 03/15/2012. Both carotid dissections were very high. His most recent surgery we had to retract on the hypoglossal nerve quite a bit. He comes in for his first postoperative visit. He complains of some difficulty swallowing it feels like his tongue is thick. He's had no focal neurologic findings.   REVIEW OF SYSTEMS: Arly.Keller ] denotes positive finding; [  ] denotes negative finding  CARDIOVASCULAR:  [ ]  chest pain   [ ]  dyspnea on exertion    CONSTITUTIONAL:  [ ]  fever   [ ]  chills  PHYSICAL EXAM: Filed Vitals:   03/30/12 0852 03/30/12 0854  BP: 130/62 122/65  Pulse: 92   Height: 6\' 3"  (1.905 m)   Weight: 231 lb (104.781 kg)   SpO2: 98%    Body mass index is 28.87 kg/(m^2). GENERAL: The patient is a well-nourished male, in no acute distress. The vital signs are documented above. CARDIOVASCULAR: There is a regular rate and rhythm  PULMONARY: There is good air exchange bilaterally without wheezing or rales. His incisions are healing nicely. He has good strength in his upper to terminate and lower trees bilaterally.  MEDICAL ISSUES:  Occlusion and stenosis of carotid artery without mention of cerebral infarction This patient is now status post staged bilateral carotid endarterectomies. Both were fairly high. Overall he is doing well. He has some slight difficulty swallowing and feels like his palm is thick. This is related to having to retract on the hypoglossal nerve given that the dissection was very high. This should improve over the next 4-6 weeks. He has had no focal neurologic findings. He will return in 6  months for a follow up carotid duplex scan. This looks good he'll have a yearly follow up carotid duplex scan. He knows to call sooner if he has problems. In the meantime he knows to continue taking his aspirin.   DICKSON,CHRISTOPHER S [facility] [place] Beeper: [phone]

## 2012-03-30 NOTE — Assessment & Plan Note (Signed)
This patient is now status post staged bilateral carotid endarterectomies. Both were fairly high. Overall he is doing well. He has some slight difficulty swallowing and feels like his palm is thick. This is related to having to retract on the hypoglossal nerve given that the dissection was very high. This should improve over the next 4-6 weeks. He has had no focal neurologic findings. He will return in 6 months for a follow up carotid duplex scan. This looks good he'll have a yearly follow up carotid duplex scan. He knows to call sooner if he has problems. In the meantime he knows to continue taking his aspirin.

## 2012-03-31 NOTE — Addendum Note (Signed)
Addended by: Sharee Pimple on: 03/31/2012 02:11 PM   Modules accepted: Orders

## 2012-03-31 NOTE — Addendum Note (Signed)
Addended by: MCCHESNEY, MARILYN K on: 03/31/2012 09:34 AM   Modules accepted: Orders  

## 2012-07-12 ENCOUNTER — Encounter (INDEPENDENT_AMBULATORY_CARE_PROVIDER_SITE_OTHER): Payer: Medicare Other

## 2012-07-12 DIAGNOSIS — I739 Peripheral vascular disease, unspecified: Secondary | ICD-10-CM

## 2012-07-12 DIAGNOSIS — I701 Atherosclerosis of renal artery: Secondary | ICD-10-CM

## 2012-08-16 ENCOUNTER — Encounter: Payer: Self-pay | Admitting: Cardiovascular Disease

## 2012-09-28 ENCOUNTER — Ambulatory Visit: Payer: Medicare Other | Admitting: Neurosurgery

## 2012-09-28 ENCOUNTER — Other Ambulatory Visit (INDEPENDENT_AMBULATORY_CARE_PROVIDER_SITE_OTHER): Payer: Medicare Other | Admitting: *Deleted

## 2012-09-28 DIAGNOSIS — I6529 Occlusion and stenosis of unspecified carotid artery: Secondary | ICD-10-CM

## 2012-09-28 DIAGNOSIS — Z48812 Encounter for surgical aftercare following surgery on the circulatory system: Secondary | ICD-10-CM

## 2012-09-29 ENCOUNTER — Other Ambulatory Visit: Payer: Self-pay

## 2012-09-29 DIAGNOSIS — Z48812 Encounter for surgical aftercare following surgery on the circulatory system: Secondary | ICD-10-CM

## 2012-10-12 ENCOUNTER — Encounter: Payer: Self-pay | Admitting: Vascular Surgery

## 2013-02-06 ENCOUNTER — Encounter: Payer: Self-pay | Admitting: Nephrology

## 2013-02-09 ENCOUNTER — Other Ambulatory Visit: Payer: Self-pay | Admitting: *Deleted

## 2013-02-09 DIAGNOSIS — I10 Essential (primary) hypertension: Secondary | ICD-10-CM

## 2013-02-09 MED ORDER — METOPROLOL SUCCINATE ER 25 MG PO TB24: 25.0000 mg | ORAL_TABLET | Freq: Every day | ORAL | Status: DC

## 2013-02-15 ENCOUNTER — Other Ambulatory Visit: Payer: Self-pay

## 2013-02-15 DIAGNOSIS — I10 Essential (primary) hypertension: Secondary | ICD-10-CM

## 2013-02-15 MED ORDER — METOPROLOL SUCCINATE ER 25 MG PO TB24: 25.0000 mg | ORAL_TABLET | Freq: Every day | ORAL | Status: DC

## 2013-02-16 ENCOUNTER — Other Ambulatory Visit: Payer: Self-pay

## 2013-02-17 ENCOUNTER — Ambulatory Visit (INDEPENDENT_AMBULATORY_CARE_PROVIDER_SITE_OTHER): Payer: Medicare Other | Admitting: Cardiovascular Disease

## 2013-02-17 ENCOUNTER — Encounter: Payer: Self-pay | Admitting: Cardiovascular Disease

## 2013-02-17 VITALS — BP 124/72 | HR 95 | Ht 74.0 in | Wt 231.0 lb

## 2013-02-17 DIAGNOSIS — E782 Mixed hyperlipidemia: Secondary | ICD-10-CM

## 2013-02-17 DIAGNOSIS — I2510 Atherosclerotic heart disease of native coronary artery without angina pectoris: Secondary | ICD-10-CM

## 2013-02-17 DIAGNOSIS — I4891 Unspecified atrial fibrillation: Secondary | ICD-10-CM

## 2013-02-17 NOTE — Patient Instructions (Addendum)
Your physician recommends that you return for a FASTING LIPID and LIVER--nothing to eat or drink after midnight, lab opens at 7:30 AM (02/21/13)  Your physician wants you to follow-up in: 1 YEAR with Dr Excell Seltzer.  You will receive a reminder letter in the mail two months in advance. If you don't receive a letter, please call our office to schedule the follow-up appointment.  Your physician recommends that you continue on your current medications as directed. Please refer to the Current Medication list given to you today.

## 2013-02-17 NOTE — Progress Notes (Signed)
HPI:  79 year old gentleman presenting for followup evaluation. The patient has CAD status post CABG in 2011. He also has peripheral arterial disease and underwent renal artery stenting in 2012. He has undergone bilateral carotid endarterectomies in the past year.  The patient is doing fairly well today. 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 [facility] and does water exercises 3 days per week. He denies exertional chest pain or pressure. He has no shortness of breath, palpitations, orthopnea, or PND.  Outpatient Encounter Prescriptions as of 02/17/2013  Medication Sig  . aspirin 81 MG tablet Take 81 mg by mouth daily.    . fluticasone (FLONASE) 50 MCG/ACT nasal spray Place 2 sprays into the nose as needed.  Marland Kitchen levothyroxine (SYNTHROID, LEVOTHROID) 125 MCG tablet Take 125 mcg by mouth daily.    . metoprolol succinate (TOPROL XL) 25 MG 24 hr tablet Take 1 tablet (25 mg total) by mouth daily.  . Multiple Vitamins-Minerals (CENTRUM SILVER PO) Take 1 tablet by mouth daily.    Marland Kitchen oxyCODONE-acetaminophen (ROXICET) 5-325 MG per tablet Take 1 tablet by mouth every 4 (four) hours as needed for pain.  . Ranitidine HCl (ZANTAC 75 PO) Take 1 tablet by mouth daily as needed. For heart burn  . rosuvastatin (CRESTOR) 10 MG tablet Take 10 mg by mouth at bedtime.  . Tamsulosin HCl (FLOMAX) 0.4 MG CAPS Take 0.4 mg by mouth daily.    . [DISCONTINUED] Omega-3 Fatty Acids (FISH OIL) 1000 MG CAPS Take 2 capsules by mouth daily.      No Known Allergies  Past Medical History  Diagnosis Date  . Hypertension   . Hyperlipidemia   . Pulmonary embolism 2009    hx of  . DJD (degenerative joint disease)   . Arrhythmia     PAROXYSMAL ATRIAL FIBRILLATION  . RBBB (right bundle branch block)   . Unstable angina   . Arthritis     OSTEOARTHRITIS  . Anemia   . Thrombocytopenia   . History of BPH   . Thyroid disease     HYPOTHYROIDISM  . DVT (deep venous thrombosis)   . Coronary artery disease  8/11    CABG...LM EMERGENT WITH IABP sees Dr. Casimiro Needle Eason Housman  . Chronic back pain   . Cataracts, bilateral   . Renal artery stenosis   . Hypothyroidism     ROS: Positive for low back pain and leg pain with standing or walking, otherwise negative except as per HPI  BP 124/72  Pulse 95  Ht 6\' 2"  (1.88 m)  Wt 231 lb (104.781 kg)  BMI 29.65 kg/m2  PHYSICAL EXAM: Pt is alert and oriented, NAD HEENT: normal Neck: JVP - normal, carotids 2+= without bruits, endarterectomy scars are well healed Lungs: CTA bilaterally CV: RRR without murmur or gallop Abd: soft, NT, Positive BS, no hepatomegaly Ext: Mild swelling of the left foot, none on the right, distal pulses intact and equal Skin: warm/dry no rash  EKG:  Normal sinus rhythm 95 beats per minute, right bundle branch block, age indeterminate inferior infarction.  ASSESSMENT AND PLAN: 1. Coronary artery disease, native vessel. The patient remains stable without anginal symptoms now 3 years out from CABG. He will continue on his same medical program which includes aspirin, a beta blocker, and a statin drug.  2. Carotid stenosis, bilateral. The patient is status post carotid endarterectomies and has recovered well. He is followed by vascular surgery.  3. Renal atherosclerosis. He is status post  renal artery stenting. Renal function has been stable.  4. Hypertension. Blood pressure is well controlled on metoprolol.  5. Hyperlipidemia. The patient is due for a lipid panel. Will arrange. He remains on Crestor.  For followup I will see him back in one year.  Tonny Bollman 02/17/2013 9:39 AM

## 2013-02-21 ENCOUNTER — Other Ambulatory Visit (INDEPENDENT_AMBULATORY_CARE_PROVIDER_SITE_OTHER): Payer: Medicare Other

## 2013-02-21 DIAGNOSIS — I4891 Unspecified atrial fibrillation: Secondary | ICD-10-CM

## 2013-02-21 DIAGNOSIS — I2510 Atherosclerotic heart disease of native coronary artery without angina pectoris: Secondary | ICD-10-CM

## 2013-02-21 DIAGNOSIS — E782 Mixed hyperlipidemia: Secondary | ICD-10-CM

## 2013-02-21 LAB — HEPATIC FUNCTION PANEL
Alkaline Phosphatase: 58 U/L (ref 39–117)
Bilirubin, Direct: 0.1 mg/dL (ref 0.0–0.3)
Total Bilirubin: 1 mg/dL (ref 0.3–1.2)
Total Protein: 6.8 g/dL (ref 6.0–8.3)

## 2013-02-21 LAB — LIPID PANEL
Cholesterol: 172 mg/dL (ref 0–200)
LDL Cholesterol: 96 mg/dL (ref 0–99)
Total CHOL/HDL Ratio: 4
VLDL: 34.2 mg/dL (ref 0.0–40.0)

## 2013-03-31 ENCOUNTER — Other Ambulatory Visit: Payer: Self-pay | Admitting: *Deleted

## 2013-03-31 DIAGNOSIS — I10 Essential (primary) hypertension: Secondary | ICD-10-CM

## 2013-03-31 MED ORDER — METOPROLOL SUCCINATE ER 25 MG PO TB24: 25.0000 mg | ORAL_TABLET | Freq: Every day | ORAL | Status: DC

## 2013-04-12 ENCOUNTER — Other Ambulatory Visit: Payer: Medicare Other

## 2013-04-12 ENCOUNTER — Ambulatory Visit: Payer: Medicare Other | Admitting: Neurosurgery

## 2013-05-26 ENCOUNTER — Other Ambulatory Visit: Payer: Self-pay | Admitting: Vascular Surgery

## 2013-05-26 DIAGNOSIS — I6529 Occlusion and stenosis of unspecified carotid artery: Secondary | ICD-10-CM

## 2013-05-26 DIAGNOSIS — Z48812 Encounter for surgical aftercare following surgery on the circulatory system: Secondary | ICD-10-CM

## 2013-08-07 ENCOUNTER — Other Ambulatory Visit (HOSPITAL_COMMUNITY): Payer: Self-pay | Admitting: Cardiology

## 2013-08-07 DIAGNOSIS — I714 Abdominal aortic aneurysm, without rupture, unspecified: Secondary | ICD-10-CM

## 2013-08-07 DIAGNOSIS — Z959 Presence of cardiac and vascular implant and graft, unspecified: Secondary | ICD-10-CM

## 2013-08-16 ENCOUNTER — Ambulatory Visit (HOSPITAL_COMMUNITY): Payer: Medicare Other | Attending: Cardiology | Admitting: Cardiology

## 2013-08-16 DIAGNOSIS — E785 Hyperlipidemia, unspecified: Secondary | ICD-10-CM | POA: Insufficient documentation

## 2013-08-16 DIAGNOSIS — Z87891 Personal history of nicotine dependence: Secondary | ICD-10-CM | POA: Insufficient documentation

## 2013-08-16 DIAGNOSIS — I714 Abdominal aortic aneurysm, without rupture, unspecified: Secondary | ICD-10-CM | POA: Insufficient documentation

## 2013-08-16 DIAGNOSIS — Z9889 Other specified postprocedural states: Secondary | ICD-10-CM | POA: Insufficient documentation

## 2013-08-16 DIAGNOSIS — I2510 Atherosclerotic heart disease of native coronary artery without angina pectoris: Secondary | ICD-10-CM | POA: Insufficient documentation

## 2013-08-16 DIAGNOSIS — I701 Atherosclerosis of renal artery: Secondary | ICD-10-CM

## 2013-08-16 DIAGNOSIS — I10 Essential (primary) hypertension: Secondary | ICD-10-CM | POA: Insufficient documentation

## 2013-08-16 DIAGNOSIS — Z951 Presence of aortocoronary bypass graft: Secondary | ICD-10-CM | POA: Insufficient documentation

## 2013-08-16 DIAGNOSIS — Z959 Presence of cardiac and vascular implant and graft, unspecified: Secondary | ICD-10-CM

## 2013-08-16 NOTE — Progress Notes (Signed)
Renal artery duplex complete

## 2013-10-04 ENCOUNTER — Ambulatory Visit: Payer: Medicare Other | Admitting: Vascular Surgery

## 2013-10-04 ENCOUNTER — Ambulatory Visit: Payer: Medicare Other | Admitting: Family

## 2013-10-04 ENCOUNTER — Other Ambulatory Visit (HOSPITAL_COMMUNITY): Payer: Medicare Other

## 2013-10-11 ENCOUNTER — Encounter: Payer: Self-pay | Admitting: Family

## 2013-10-12 ENCOUNTER — Ambulatory Visit (HOSPITAL_COMMUNITY)
Admission: RE | Admit: 2013-10-12 | Discharge: 2013-10-12 | Disposition: A | Discharge status: Home / Self Care | Payer: Medicare Other | Source: Ambulatory Visit | Attending: Family | Admitting: Family

## 2013-10-12 ENCOUNTER — Ambulatory Visit (INDEPENDENT_AMBULATORY_CARE_PROVIDER_SITE_OTHER): Payer: Medicare Other | Admitting: Family

## 2013-10-12 ENCOUNTER — Encounter: Payer: Self-pay | Admitting: Family

## 2013-10-12 VITALS — BP 162/76 | HR 61 | Resp 16 | Ht 74.0 in | Wt 243.0 lb

## 2013-10-12 DIAGNOSIS — Z48812 Encounter for surgical aftercare following surgery on the circulatory system: Secondary | ICD-10-CM

## 2013-10-12 DIAGNOSIS — I6529 Occlusion and stenosis of unspecified carotid artery: Secondary | ICD-10-CM
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
## 2013-10-12 NOTE — Patient Instructions (Signed)
Stroke Prevention Some medical conditions and behaviors are associated with an increased chance of having a stroke. You may prevent a stroke by making healthy choices and managing medical conditions. HOW CAN I REDUCE MY RISK OF HAVING A STROKE?   Stay physically active. Get at least 30 minutes of activity on most or all days.  Do not smoke. It may also be helpful to avoid exposure to secondhand smoke.  Limit alcohol use. Moderate alcohol use is considered to be:  No more than 2 drinks per day for men.  No more than 1 drink per day for nonpregnant women.  Eat healthy foods. This involves  Eating 5 or more servings of fruits and vegetables a day.  Following a diet that addresses high blood pressure (hypertension), high cholesterol, diabetes, or obesity.  Manage your cholesterol levels.  A diet low in saturated fat, trans fat, and cholesterol and high in fiber may control cholesterol levels.  Take any prescribed medicines to control cholesterol as directed by your health care provider.  Manage your diabetes.  A controlled-carbohydrate, controlled-sugar diet is recommended to manage diabetes.  Take any prescribed medicines to control diabetes as directed by your health care provider.  Control your hypertension.  A low-salt (sodium), low-saturated fat, low-trans fat, and low-cholesterol diet is recommended to manage hypertension.  Take any prescribed medicines to control hypertension as directed by your health care provider.  Maintain a healthy weight.  A reduced-calorie, low-sodium, low-saturated fat, low-trans fat, low-cholesterol diet is recommended to manage weight.  Stop drug abuse.  Avoid taking birth control pills.  Talk to your health care provider about the risks of taking birth control pills if you are over 35 years old, smoke, get migraines, or have ever had a blood clot.  Get evaluated for sleep disorders (sleep apnea).  Talk to your health care provider about  getting a sleep evaluation if you snore a lot or have excessive sleepiness.  Take medicines as directed by your health care provider.  For some people, aspirin or blood thinners (anticoagulants) are helpful in reducing the risk of forming abnormal blood clots that can lead to stroke. If you have the irregular heart rhythm of atrial fibrillation, you should be on a blood thinner unless there is a good reason you cannot take them.  Understand all your medicine instructions.  Make sure that other other conditions (such as anemia or atherosclerosis) are addressed. SEEK IMMEDIATE MEDICAL CARE IF:   You have sudden weakness or numbness of the face, arm, or leg, especially on one side of the body.  Your face or eyelid droops to one side.  You have sudden confusion.  You have trouble speaking (aphasia) or understanding.  You have sudden trouble seeing in one or both eyes.  You have sudden trouble walking.  You have dizziness.  You have a loss of balance or coordination.  You have a sudden, severe headache with no known cause.  You have new chest pain or an irregular heartbeat. Any of these symptoms may represent a serious problem that is an emergency. Do not wait to see if the symptoms will go away. Get medical help at once. Call your local emergency services  (911 in [place] Do not drive yourself to the hospital. Document Released: 05/07/2004 Document Revised: 01/18/2013 Document Reviewed: 09/30/2012 Exit[employer] Patient Information 2015 [employer]. This information is not intended to replace advice given to you by your health care provider. Make sure you discuss any questions you have with your health   care provider.  

## 2013-10-23 ENCOUNTER — Ambulatory Visit (HOSPITAL_COMMUNITY): Payer: Medicare Other

## 2014-02-23 ENCOUNTER — Encounter: Payer: Self-pay | Admitting: Cardiovascular Disease

## 2014-02-23 ENCOUNTER — Ambulatory Visit (INDEPENDENT_AMBULATORY_CARE_PROVIDER_SITE_OTHER): Payer: Medicare Other | Admitting: Cardiovascular Disease

## 2014-02-23 VITALS — BP 136/64 | HR 89 | Ht 74.0 in | Wt 235.1 lb

## 2014-02-23 DIAGNOSIS — I4891 Unspecified atrial fibrillation: Secondary | ICD-10-CM

## 2014-02-23 DIAGNOSIS — I2510 Atherosclerotic heart disease of native coronary artery without angina pectoris: Secondary | ICD-10-CM

## 2014-02-23 DIAGNOSIS — I10 Essential (primary) hypertension: Secondary | ICD-10-CM

## 2014-02-23 DIAGNOSIS — E782 Mixed hyperlipidemia: Secondary | ICD-10-CM

## 2014-02-23 NOTE — Patient Instructions (Signed)
Your physician wants you to follow-up in: 1 YEAR with Dr Cooper.  You will receive a reminder letter in the mail two months in advance. If you don't receive a letter, please call our office to schedule the follow-up appointment.  Your physician recommends that you continue on your current medications as directed. Please refer to the Current Medication list given to you today.  

## 2014-02-23 NOTE — Progress Notes (Signed)
Background: The patient has CAD status post CABG in 2011. He also has peripheral arterial disease and underwent renal artery stenting in 2012. He underwent bilateral carotid endarterectomies in 2013.  HPI:  80 year old gentleman presenting for follow-up evaluation. He seems to be doing okay from a cardiac perspective. The patient denies chest pain, chest pressure, or shortness of breath. He denies leg swelling, heart palpitations, orthopnea, or PND. He's had problems with his low back. He has become less active because of his back pain. He had an injection recently and this has given him some symptomatic relief. He states that his legs feel weak.  Outpatient Encounter Prescriptions as of 02/23/2014  Medication Sig  . aspirin 81 MG tablet Take 81 mg by mouth daily.    . fluticasone (FLONASE) 50 MCG/ACT nasal spray Place 2 sprays into the nose as needed.  Marland Kitchen levothyroxine (SYNTHROID, LEVOTHROID) 125 MCG tablet Take 125 mcg by mouth daily.    . metoprolol succinate (TOPROL XL) 25 MG 24 hr tablet Take 1 tablet (25 mg total) by mouth daily.  . Multiple Vitamins-Minerals (CENTRUM SILVER PO) Take 1 tablet by mouth daily.    Marland Kitchen oxyCODONE-acetaminophen (ROXICET) 5-325 MG per tablet Take 1 tablet by mouth every 4 (four) hours as needed for pain.  . Ranitidine HCl (ZANTAC 75 PO) Take 1 tablet by mouth daily as needed. For heart burn  . rosuvastatin (CRESTOR) 10 MG tablet Take 10 mg by mouth at bedtime.  . Tamsulosin HCl (FLOMAX) 0.4 MG CAPS Take 0.4 mg by mouth daily.      No Known Allergies  Past Medical History  Diagnosis Date  . Hypertension   . Hyperlipidemia   . Pulmonary embolism 2009    hx of  . DJD (degenerative joint disease)   . Arrhythmia     PAROXYSMAL ATRIAL FIBRILLATION  . RBBB (right bundle branch block)   . Unstable angina   . Arthritis     OSTEOARTHRITIS  . Anemia   . Thrombocytopenia   . History of BPH   . Thyroid disease     HYPOTHYROIDISM  . DVT (deep venous  thrombosis)   . Coronary artery disease 8/11    CABG...LM EMERGENT WITH IABP sees Dr. Legrand Como [place]  . Chronic back pain   . Cataracts, bilateral   . Renal artery stenosis   . Hypothyroidism     family history includes Cancer in his sister; Cancer (age of onset: 59) in his father; Diabetes in his brother; Heart attack (age of onset: 59) in his brother; Hyperlipidemia in his mother; Liver cancer (age of onset: 77) in his sister; Lung cancer (age of onset: 15) in his father; Stroke (age of onset: 47) in his mother.   ROS: Negative except as per HPI  BP 136/64 mmHg  Pulse 89  Ht 6\' 2"  (1.88 m)  Wt 235 lb 1.9 oz (106.65 kg)  BMI 30.17 kg/m2  PHYSICAL EXAM: Pt is alert and oriented, pleasant elderly male in NAD HEENT: normal Neck: JVP - normal, carotids 2+= without bruits Lungs: CTA bilaterally CV: RRR without murmur or gallop Abd: soft, NT, Positive BS, no hepatomegaly Ext: no C/C/E, distal pulses intact and equal Skin: warm/dry no rash  EKG:  Normal sinus rhythm 89 bpm, right bundle branch block, possible inferior infarction age undetermined.  ASSESSMENT AND PLAN: 1. Coronary artery disease, native vessel, without symptoms of angina. The patient is status post CABG in 2011. He will continue on his same medical program which includes aspirin,  a beta blocker, and a statin drug.  2. Carotid stenosis, bilateral. The patient is status post carotid endarterectomies and this followed by vascular surgery. Most recent duplex studies reviewed and demonstrated no evidence of hemodynamically significant stenosis.  3. Renal atherosclerosis. He is status post renal artery stenting. Blood pressure is well controlled on metoprolol. His labs are followed by Dr. Jimmy Footman.  4. Hypertension. Blood pressure is well controlled on metoprolol.  5. Hyperlipidemia. He remains on Crestor. Lipid Panel 02/2013    Component Value Date/Time   CHOL 172 02/21/2013 0816   TRIG 171.0* 02/21/2013 0816   HDL  41.60 02/21/2013 0816   CHOLHDL 4 02/21/2013 0816   VLDL 34.2 02/21/2013 0816   LDLCALC 96 02/21/2013 0816   LDLDIRECT 100.8 08/12/2010 1523   I will see him back in one year for follow-up.  [name], MD 02/23/2014 8:42 AM

## 2014-05-08 ENCOUNTER — Ambulatory Visit: Payer: Self-pay | Admitting: Ophthalmology

## 2014-05-08 DIAGNOSIS — I10 Essential (primary) hypertension: Secondary | ICD-10-CM

## 2014-05-08 LAB — POTASSIUM: Potassium: 4 mmol/L (ref 3.5–5.1)

## 2014-05-17 ENCOUNTER — Ambulatory Visit: Payer: Self-pay | Admitting: Ophthalmology

## 2014-06-02 ENCOUNTER — Emergency Department (HOSPITAL_COMMUNITY): Payer: Medicare Other

## 2014-06-02 ENCOUNTER — Inpatient Hospital Stay (HOSPITAL_COMMUNITY)
Admission: EM | Admit: 2014-06-02 | Discharge: 2014-06-04 | DRG: 176 | Disposition: A | Discharge status: Home / Self Care | Payer: Medicare Other | Attending: Internal Medicine | Admitting: Internal Medicine

## 2014-06-02 ENCOUNTER — Encounter (HOSPITAL_COMMUNITY): Payer: Self-pay | Admitting: Adult Health

## 2014-06-02 DIAGNOSIS — M549 Dorsalgia, unspecified: Secondary | ICD-10-CM | POA: Diagnosis present

## 2014-06-02 DIAGNOSIS — Z7982 Long term (current) use of aspirin: Secondary | ICD-10-CM

## 2014-06-02 DIAGNOSIS — Z96659 Presence of unspecified artificial knee joint: Secondary | ICD-10-CM | POA: Diagnosis present

## 2014-06-02 DIAGNOSIS — R0602 Shortness of breath: Secondary | ICD-10-CM | POA: Diagnosis not present

## 2014-06-02 DIAGNOSIS — Z87891 Personal history of nicotine dependence: Secondary | ICD-10-CM

## 2014-06-02 DIAGNOSIS — N400 Enlarged prostate without lower urinary tract symptoms: Secondary | ICD-10-CM | POA: Diagnosis present

## 2014-06-02 DIAGNOSIS — I25110 Atherosclerotic heart disease of native coronary artery with unstable angina pectoris: Secondary | ICD-10-CM | POA: Diagnosis present

## 2014-06-02 DIAGNOSIS — I480 Paroxysmal atrial fibrillation: Secondary | ICD-10-CM | POA: Diagnosis present

## 2014-06-02 DIAGNOSIS — I701 Atherosclerosis of renal artery: Secondary | ICD-10-CM | POA: Diagnosis present

## 2014-06-02 DIAGNOSIS — N189 Chronic kidney disease, unspecified: Secondary | ICD-10-CM | POA: Diagnosis present

## 2014-06-02 DIAGNOSIS — I10 Essential (primary) hypertension: Secondary | ICD-10-CM | POA: Diagnosis present

## 2014-06-02 DIAGNOSIS — Z86711 Personal history of pulmonary embolism: Secondary | ICD-10-CM | POA: Diagnosis present

## 2014-06-02 DIAGNOSIS — E782 Mixed hyperlipidemia: Secondary | ICD-10-CM | POA: Diagnosis present

## 2014-06-02 DIAGNOSIS — Z86718 Personal history of other venous thrombosis and embolism: Secondary | ICD-10-CM

## 2014-06-02 DIAGNOSIS — I2699 Other pulmonary embolism without acute cor pulmonale: Secondary | ICD-10-CM | POA: Diagnosis not present

## 2014-06-02 DIAGNOSIS — K5900 Constipation, unspecified: Secondary | ICD-10-CM | POA: Diagnosis present

## 2014-06-02 DIAGNOSIS — D696 Thrombocytopenia, unspecified: Secondary | ICD-10-CM | POA: Diagnosis present

## 2014-06-02 DIAGNOSIS — I129 Hypertensive chronic kidney disease with stage 1 through stage 4 chronic kidney disease, or unspecified chronic kidney disease: Secondary | ICD-10-CM | POA: Diagnosis present

## 2014-06-02 DIAGNOSIS — I5032 Chronic diastolic (congestive) heart failure: Secondary | ICD-10-CM | POA: Diagnosis present

## 2014-06-02 DIAGNOSIS — G8929 Other chronic pain: Secondary | ICD-10-CM | POA: Diagnosis present

## 2014-06-02 DIAGNOSIS — E875 Hyperkalemia: Secondary | ICD-10-CM | POA: Diagnosis present

## 2014-06-02 DIAGNOSIS — Z96649 Presence of unspecified artificial hip joint: Secondary | ICD-10-CM | POA: Diagnosis present

## 2014-06-02 DIAGNOSIS — Z66 Do not resuscitate: Secondary | ICD-10-CM | POA: Diagnosis present

## 2014-06-02 DIAGNOSIS — Z951 Presence of aortocoronary bypass graft: Secondary | ICD-10-CM

## 2014-06-02 DIAGNOSIS — M1990 Unspecified osteoarthritis, unspecified site: Secondary | ICD-10-CM | POA: Diagnosis present

## 2014-06-02 DIAGNOSIS — I2510 Atherosclerotic heart disease of native coronary artery without angina pectoris: Secondary | ICD-10-CM | POA: Diagnosis present

## 2014-06-02 DIAGNOSIS — I4510 Unspecified right bundle-branch block: Secondary | ICD-10-CM | POA: Diagnosis present

## 2014-06-02 DIAGNOSIS — E039 Hypothyroidism, unspecified: Secondary | ICD-10-CM | POA: Diagnosis present

## 2014-06-02 DIAGNOSIS — D649 Anemia, unspecified: Secondary | ICD-10-CM | POA: Diagnosis present

## 2014-06-02 HISTORY — PX: OTHER SURGICAL HISTORY: SHX169

## 2014-06-02 LAB — I-STAT TROPONIN, ED: TROPONIN I, POC: 0.03 ng/mL (ref 0.00–0.08)

## 2014-06-02 LAB — COMPREHENSIVE METABOLIC PANEL
ALK PHOS: 60 U/L (ref 39–117)
ALT: 25 U/L (ref 0–53)
AST: 54 U/L — ABNORMAL HIGH (ref 0–37)
Albumin: 3.7 g/dL (ref 3.5–5.2)
Anion gap: 7 (ref 5–15)
BILIRUBIN TOTAL: 2 mg/dL — AB (ref 0.3–1.2)
BUN: 22 mg/dL (ref 6–23)
CALCIUM: 8.7 mg/dL (ref 8.4–10.5)
CO2: 25 mmol/L (ref 19–32)
CREATININE: 1.57 mg/dL — AB (ref 0.50–1.35)
Chloride: 104 mmol/L (ref 96–112)
GFR calc Af Amer: 46 mL/min — ABNORMAL LOW (ref >90)
GFR calc non Af Amer: 39 mL/min — ABNORMAL LOW (ref >90)
Glucose, Bld: 159 mg/dL — ABNORMAL HIGH (ref 70–99)
POTASSIUM: 5.8 mmol/L — AB (ref 3.5–5.1)
SODIUM: 136 mmol/L (ref 135–145)
TOTAL PROTEIN: 6.4 g/dL (ref 6.0–8.3)

## 2014-06-02 LAB — POTASSIUM: POTASSIUM: 4 mmol/L (ref 3.5–5.1)

## 2014-06-02 LAB — CBC
HCT: 48.4 % (ref 39.0–52.0)
Hemoglobin: 16.6 g/dL (ref 13.0–17.0)
MCH: 32.8 pg (ref 26.0–34.0)
MCHC: 34.3 g/dL (ref 30.0–36.0)
MCV: 95.7 fL (ref 78.0–100.0)
Platelets: 151 10*3/uL (ref 150–400)
RBC: 5.06 MIL/uL (ref 4.22–5.81)
RDW: 12.6 % (ref 11.5–15.5)
WBC: 12.6 10*3/uL — ABNORMAL HIGH (ref 4.0–10.5)

## 2014-06-02 LAB — BRAIN NATRIURETIC PEPTIDE: B Natriuretic Peptide: 42.8 pg/mL (ref 0.0–100.0)

## 2014-06-02 LAB — D-DIMER, QUANTITATIVE (NOT AT ARMC): D DIMER QUANT: 17.5 ug{FEU}/mL — AB (ref 0.00–0.48)

## 2014-06-02 MED ORDER — IOHEXOL 350 MG/ML SOLN
80.0000 mL | Freq: Once | INTRAVENOUS | Status: AC | PRN
  Administered 2014-06-02: 80 mL via INTRAVENOUS

## 2014-06-02 MED ORDER — SODIUM CHLORIDE 0.9 % IV BOLUS (SEPSIS)
250.0000 mL | Freq: Once | INTRAVENOUS | Status: AC
  Administered 2014-06-02: 250 mL via INTRAVENOUS

## 2014-06-02 MED ORDER — ASPIRIN 325 MG PO TABS
325.0000 mg | ORAL_TABLET | ORAL | Status: AC
  Administered 2014-06-02: 325 mg via ORAL
  Filled 2014-06-02: qty 1

## 2014-06-02 NOTE — ED Provider Notes (Signed)
CSN: [id number]     Arrival date & time 06/02/14  2118 History   First MD Initiated Contact with Patient 06/02/14 2129     Chief Complaint  Patient presents with  . Shortness of Breath     (Consider location/radiation/quality/duration/timing/severity/associated sxs/prior Treatment) Patient is a 81 y.o. male presenting with shortness of breath. The history is provided by the patient.  Shortness of Breath Severity:  Mild Onset quality:  Gradual Timing:  Constant Progression:  Worsening Chronicity:  New Context: activity   Relieved by: mildly by rest. Worsened by:  Nothing tried Ineffective treatments:  None tried Associated symptoms: chest pain   Associated symptoms: no abdominal pain, no cough, no fever, no headaches, no neck pain and no vomiting   Chest pain:    Quality:  Dull   Severity:  Mild   Onset quality:  Gradual   Timing:  Constant   Progression:  Unchanged   Chronicity:  New   Past Medical History  Diagnosis Date  . Hypertension   . Hyperlipidemia   . Pulmonary embolism 2009    hx of  . DJD (degenerative joint disease)   . Arrhythmia     PAROXYSMAL ATRIAL FIBRILLATION  . RBBB (right bundle branch block)   . Unstable angina   . Arthritis     OSTEOARTHRITIS  . Anemia   . Thrombocytopenia   . History of BPH   . Thyroid disease     HYPOTHYROIDISM  . DVT (deep venous thrombosis)   . Coronary artery disease 8/11    CABG...LM EMERGENT WITH IABP sees Dr. Legrand [facility] cooper  . Chronic back pain   . Cataracts, bilateral   . Renal artery stenosis   . Hypothyroidism    Past Surgical History  Procedure Laterality Date  . Replacement total knee  2006    RIGHT  . Coronary artery bypass graft  11-21-09    EMERGENT X 3 GRAFTING. ..[name], MD, cc: [name]  . Mandible surgery    . Foot surgery    . Renal artery stent  2010  . Total hip arthroplasty      right  . Endarterectomy  01/28/2012    Procedure: ENDARTERECTOMY CAROTID;  Surgeon:  [name], MD;  Location: [facility];  Service: Vascular;  Laterality: Right;  with Primary Closure of Artery  . Endarterectomy  03/15/2012    Procedure: ENDARTERECTOMY CAROTID;  Surgeon: [name], MD;  Location: [facility] OR;  Service: Vascular;  Laterality: Left;  . Carotid endarterectomy     Family History  Problem Relation Age of Onset  . Lung cancer Father 19    deceased   . Cancer Father 18    LUNG  . Stroke Mother 60    DECEASED   . Hyperlipidemia Mother   . Heart attack Brother 62    deceased  . Diabetes Brother   . Liver cancer Sister 68    deceased   . Cancer Sister    History  Substance Use Topics  . Smoking status: Former Smoker    Quit date: 04/13/1974  . Smokeless tobacco: Former Systems developer    Quit date: 01/26/2002     Comment: He has smoked about 27-pack-year hx   . Alcohol Use: 1.2 oz/week    2 Cans of beer per week     Comment: He used to drink more heavily , but rarely drinks at the current time    Review of Systems  Constitutional: Negative for fever.  HENT:  Negative for drooling and rhinorrhea.   Eyes: Negative for pain.  Respiratory: Positive for shortness of breath. Negative for cough.   Cardiovascular: Positive for chest pain. Negative for leg swelling.  Gastrointestinal: Negative for nausea, vomiting, abdominal pain and diarrhea.  Genitourinary: Negative for dysuria and hematuria.  Musculoskeletal: Negative for gait problem and neck pain.  Skin: Negative for color change.  Neurological: Negative for numbness and headaches.  Hematological: Negative for adenopathy.  Psychiatric/Behavioral: Negative for behavioral problems.  All other systems reviewed and are negative.     Allergies  Review of patient's allergies indicates no known allergies.  Home Medications   Prior to Admission medications   Medication Sig Start Date End Date Taking? Authorizing Provider  aspirin 81 MG tablet Take 81 mg by mouth daily.      Historical Provider,  MD  fluticasone (FLONASE) 50 MCG/ACT nasal spray Place 2 sprays into the nose as needed. 02/04/12   Historical Provider, MD  levothyroxine (SYNTHROID, LEVOTHROID) 125 MCG tablet Take 125 mcg by mouth daily.      Historical Provider, MD  metoprolol succinate (TOPROL XL) 25 MG 24 hr tablet Take 1 tablet (25 mg total) by mouth daily. 03/31/13   Blane Ohara, MD  Multiple Vitamins-Minerals (CENTRUM SILVER PO) Take 1 tablet by mouth daily.      Historical Provider, MD  oxyCODONE-acetaminophen (ROXICET) 5-325 MG per tablet Take 1 tablet by mouth every 4 (four) hours as needed for pain. 03/15/12   Ulyses Amor, PA-C  Ranitidine HCl (ZANTAC 75 PO) Take 1 tablet by mouth daily as needed. For heart burn    Historical Provider, MD  rosuvastatin (CRESTOR) 10 MG tablet Take 10 mg by mouth at bedtime.    Historical Provider, MD  Tamsulosin HCl (FLOMAX) 0.4 MG CAPS Take 0.4 mg by mouth daily.      Historical Provider, MD   BP 161/73 mmHg  Pulse 60  Temp(Src) 97.7 F (36.5 C) (Oral)  Resp 18  Ht 6\' 2"  (1.88 m)  Wt 250 lb (113.399 kg)  BMI 32.08 kg/m2  SpO2 91% Physical Exam  Constitutional: He is oriented to person, place, and time. He appears well-developed and well-nourished.  HENT:  Head: Normocephalic and atraumatic.  Right Ear: External ear normal.  Left Ear: External ear normal.  Nose: Nose normal.  Mouth/Throat: Oropharynx is clear and moist. No oropharyngeal exudate.  Eyes: Conjunctivae and EOM are normal. Pupils are equal, round, and reactive to light.  Neck: Normal range of motion. Neck supple.  Cardiovascular: Normal rate, regular rhythm, normal heart sounds and intact distal pulses.  Exam reveals no gallop and no friction rub.   No murmur heard. Pulmonary/Chest: Effort normal and breath sounds normal. No respiratory distress. He has no wheezes.  Abdominal: Soft. Bowel sounds are normal. He exhibits no distension. There is no tenderness. There is no rebound and no guarding.   Musculoskeletal: Normal range of motion. He exhibits no edema or tenderness.  Neurological: He is alert and oriented to person, place, and time.  Skin: Skin is warm and dry.  Psychiatric: He has a normal mood and affect. His behavior is normal.  Nursing note and vitals reviewed.   ED Course  Procedures (including critical care time) Labs Review Labs Reviewed  CBC - Abnormal; Notable for the following:    WBC 12.6 (*)    All other components within normal limits  COMPREHENSIVE METABOLIC PANEL - Abnormal; Notable for the following:    Potassium 5.8 (*)  Glucose, Bld 159 (*)    Creatinine, Ser 1.57 (*)    AST 54 (*)    Total Bilirubin 2.0 (*)    GFR calc non Af Amer 39 (*)    GFR calc Af Amer 46 (*)    All other components within normal limits  D-DIMER, QUANTITATIVE - Abnormal; Notable for the following:    D-Dimer, Quant 17.50 (*)    All other components within normal limits  BASIC METABOLIC PANEL - Abnormal; Notable for the following:    Glucose, Bld 105 (*)    Creatinine, Ser 1.37 (*)    GFR calc non Af Amer 46 (*)    GFR calc Af Amer 54 (*)    All other components within normal limits  BRAIN NATRIURETIC PEPTIDE  POTASSIUM  CBC  TSH  TROPONIN I  TROPONIN I  TROPONIN I  HEPARIN LEVEL (UNFRACTIONATED)  I-STAT TROPOININ, ED    Imaging Review Dg Chest 2 View  06/02/2014   CLINICAL DATA:  SOB. Left side chest pain. Hx bypass surgery, HTN, PE, and CAD.  EXAM: CHEST  2 VIEW  COMPARISON:  01/27/2012  FINDINGS: Lungs are mildly hyperexpanded. There is some minor reticular scarring at the lung bases, stable. No lung consolidation or edema. No pleural effusion or pneumothorax.  Changes from CABG surgery are stable. Cardiac silhouette is normal in size and configuration. No mediastinal or hilar masses or evidence of adenopathy.  Bony thorax is demineralized but grossly intact.  IMPRESSION: No acute cardiopulmonary disease.   Electronically Signed   By: Lajean Manes M.D.   On:  06/02/2014 22:55   Ct Angio Chest Pe W/cm &/or Wo Cm  06/03/2014   CLINICAL DATA:  Presents with sudden onset of SOB and weakness begna this evening. Unable to walk short distance without feeling winded and tired-abnormal for patient. Pt has elevated HR-denies pain. Breath sounds diminished, sats 91% RA, no pedal or lower extremity noted. HX of PEs  EXAM: CT ANGIOGRAPHY CHEST WITH CONTRAST  TECHNIQUE: Multidetector CT imaging of the chest was performed using the standard protocol during bolus administration of intravenous contrast. Multiplanar CT image reconstructions and MIPs were obtained to evaluate the vascular anatomy.  CONTRAST:  73mL OMNIPAQUE IOHEXOL 350 MG/ML SOLN  COMPARISON:  11/17/2009  FINDINGS: Angiographic study: There are bilateral pulmonary emboli. Pulmonary embolus extends from the peripheral right pulmonary artery into the right upper car right middle and lower lobe segmental branches. There also left pulmonary artery emboli lying at the origin of the left upper lobe pulmonary embolus and extending into the left lower lobe segmental branches. There is a greater burden of pulmonary emboli on the right than on the left.  Right ventricular to left ventricular ratio is 1.2 suggesting right heart strain.  Mediastinum: Heart is normal in size. Changes from CABG surgery. There are moderate coronary artery calcifications. Aorta is normal in caliber. Atherosclerotic calcification is noted along the thoracic aorta. No mediastinal or hilar masses. There are few prominent mediastinal lymph nodes, largest is a left paratracheal node just above the carina measuring 1 cm in short axis. No hilar masses or enlarged lymph nodes.  Lungs and pleura: Linear and reticular opacities in the lower lobes and base of the right middle lobe and lingula of the left upper lobe consistent with subsegmental atelectasis. No lung consolidation or edema. No pleural effusion. No mass or suspicious nodule. Calcified granuloma noted  in the lateral right lower lobe.  Limited upper abdomen: Mild bilateral renal cortical thinning.  No acute findings.  Musculoskeletal: Bony thorax is demineralized. There are degenerative changes along the visualized spine. No osteoblastic or osteolytic lesions.  Review of the MIP images confirms the above findings.  IMPRESSION: 1. Bilateral pulmonary emboli as described. No evidence of pulmonary infarction. Lungs show subsegmental atelectasis. There is no pleural effusion. 2. Right ventricular to left ventricular ratios 1.2 consistent with right heart strain. Positive for acute PE with CT evidence of right heartstrain (RV/LV Ratio = 1.2) consistent with at least submassive (intermediate risk)PE. The presence of right heart strain has been associated with anincreased risk of morbidity and mortality. Consultation with [facility] is recommended. Critical Value/emergent results were called by telephone at the time of interpretation on 06/03/2014 at 12:13 am to [facility], who verbally acknowledged these results.   Electronically Signed   By: Lajean Manes M.D.   On: 06/03/2014 00:14     EKG Interpretation   Date/Time:  Saturday June 02 2014 21:27:31 EST Ventricular Rate:  120 PR Interval:    QRS Duration: 134 QT Interval:  366 QTC Calculation: 517 R Axis:   77 Text Interpretation:  Undetermined rhythm Right bundle branch block  Possible Inferior infarct , age undetermined No significant change since  last tracing Confirmed by Svara Twyman  MD, Marvel[facility] [place] ([id number]) on 06/02/2014  9:32:26 PM     CRITICAL CARE Performed by: Pamella Pert, S Total critical care time: 30 min Critical care time was exclusive of separately billable procedures and treating other patients. Critical care was necessary to treat or prevent imminent or life-threatening deterioration. Critical care was time spent personally by me on the following activities: development of treatment plan with patient and/or  surrogate as well as nursing, discussions with consultants, evaluation of patient's response to treatment, examination of patient, obtaining history from patient or surrogate, ordering and performing treatments and interventions, ordering and review of laboratory studies, ordering and review of radiographic studies, pulse oximetry and re-evaluation of patient's condition.  MDM   Final diagnoses:  SOB (shortness of breath)  Pulmonary embolism    9:48 PM 81 y.o. male w hx of HTN, DVT/PE, CAD s/p CABG who presents with shortness of breath and chest pain. He states that he has felt short of breath today with exertion and this has persisted at rest. He also notes some left-sided dull chest pain, 2 out of 10, which has been there for the last few hours. His wife notes that he had cataract surgery earlier in the month and he has been more sedentary since that time. He is afebrile and vital signs are unremarkable here. We'll get screening labs and imaging.  Pt found to have PE. Ordered heparin. Will admit to hospitalist.     Pamella Pert, MD 06/03/14 1148

## 2014-06-02 NOTE — ED Notes (Signed)
Pt transported to xray 

## 2014-06-02 NOTE — ED Notes (Addendum)
Presents with sudden onset of SOB and weakness begna this evening. Unable to walk short distance without feeling winded and tired-abnormal for patient. Pt has elevated HR-denies pain. Breath sounds diminished, sats 91% RA, no pedal or lower extremity noted.  HX of PEs

## 2014-06-03 ENCOUNTER — Encounter (HOSPITAL_COMMUNITY): Payer: Self-pay | Admitting: Cardiology

## 2014-06-03 DIAGNOSIS — I2510 Atherosclerotic heart disease of native coronary artery without angina pectoris: Secondary | ICD-10-CM

## 2014-06-03 DIAGNOSIS — R0602 Shortness of breath: Secondary | ICD-10-CM

## 2014-06-03 DIAGNOSIS — I25110 Atherosclerotic heart disease of native coronary artery with unstable angina pectoris: Secondary | ICD-10-CM | POA: Diagnosis present

## 2014-06-03 DIAGNOSIS — I701 Atherosclerosis of renal artery: Secondary | ICD-10-CM | POA: Diagnosis present

## 2014-06-03 DIAGNOSIS — I2699 Other pulmonary embolism without acute cor pulmonale: Principal | ICD-10-CM

## 2014-06-03 DIAGNOSIS — Z96649 Presence of unspecified artificial hip joint: Secondary | ICD-10-CM | POA: Diagnosis present

## 2014-06-03 DIAGNOSIS — D649 Anemia, unspecified: Secondary | ICD-10-CM | POA: Diagnosis present

## 2014-06-03 DIAGNOSIS — Z7982 Long term (current) use of aspirin: Secondary | ICD-10-CM | POA: Diagnosis not present

## 2014-06-03 DIAGNOSIS — E039 Hypothyroidism, unspecified: Secondary | ICD-10-CM | POA: Diagnosis present

## 2014-06-03 DIAGNOSIS — N400 Enlarged prostate without lower urinary tract symptoms: Secondary | ICD-10-CM | POA: Diagnosis present

## 2014-06-03 DIAGNOSIS — Z96659 Presence of unspecified artificial knee joint: Secondary | ICD-10-CM | POA: Diagnosis present

## 2014-06-03 DIAGNOSIS — E782 Mixed hyperlipidemia: Secondary | ICD-10-CM

## 2014-06-03 DIAGNOSIS — I10 Essential (primary) hypertension: Secondary | ICD-10-CM

## 2014-06-03 DIAGNOSIS — K5900 Constipation, unspecified: Secondary | ICD-10-CM | POA: Diagnosis present

## 2014-06-03 DIAGNOSIS — Z66 Do not resuscitate: Secondary | ICD-10-CM | POA: Diagnosis present

## 2014-06-03 DIAGNOSIS — M1990 Unspecified osteoarthritis, unspecified site: Secondary | ICD-10-CM | POA: Diagnosis present

## 2014-06-03 DIAGNOSIS — Z86711 Personal history of pulmonary embolism: Secondary | ICD-10-CM | POA: Diagnosis present

## 2014-06-03 DIAGNOSIS — G8929 Other chronic pain: Secondary | ICD-10-CM | POA: Diagnosis present

## 2014-06-03 DIAGNOSIS — Z87891 Personal history of nicotine dependence: Secondary | ICD-10-CM | POA: Diagnosis not present

## 2014-06-03 DIAGNOSIS — Z951 Presence of aortocoronary bypass graft: Secondary | ICD-10-CM | POA: Diagnosis not present

## 2014-06-03 DIAGNOSIS — I129 Hypertensive chronic kidney disease with stage 1 through stage 4 chronic kidney disease, or unspecified chronic kidney disease: Secondary | ICD-10-CM | POA: Diagnosis present

## 2014-06-03 DIAGNOSIS — I4510 Unspecified right bundle-branch block: Secondary | ICD-10-CM | POA: Diagnosis present

## 2014-06-03 DIAGNOSIS — N189 Chronic kidney disease, unspecified: Secondary | ICD-10-CM | POA: Diagnosis present

## 2014-06-03 DIAGNOSIS — M549 Dorsalgia, unspecified: Secondary | ICD-10-CM | POA: Diagnosis present

## 2014-06-03 DIAGNOSIS — E038 Other specified hypothyroidism: Secondary | ICD-10-CM

## 2014-06-03 DIAGNOSIS — E875 Hyperkalemia: Secondary | ICD-10-CM | POA: Diagnosis present

## 2014-06-03 DIAGNOSIS — I5032 Chronic diastolic (congestive) heart failure: Secondary | ICD-10-CM | POA: Diagnosis present

## 2014-06-03 DIAGNOSIS — Z86718 Personal history of other venous thrombosis and embolism: Secondary | ICD-10-CM | POA: Diagnosis not present

## 2014-06-03 DIAGNOSIS — D696 Thrombocytopenia, unspecified: Secondary | ICD-10-CM | POA: Diagnosis present

## 2014-06-03 DIAGNOSIS — M150 Primary generalized (osteo)arthritis: Secondary | ICD-10-CM

## 2014-06-03 DIAGNOSIS — I480 Paroxysmal atrial fibrillation: Secondary | ICD-10-CM | POA: Diagnosis present

## 2014-06-03 LAB — HEPARIN LEVEL (UNFRACTIONATED)
HEPARIN UNFRACTIONATED: 0.52 [IU]/mL (ref 0.30–0.70)
HEPARIN UNFRACTIONATED: 0.75 [IU]/mL — AB (ref 0.30–0.70)

## 2014-06-03 LAB — CBC
HEMATOCRIT: 45 % (ref 39.0–52.0)
Hemoglobin: 15.5 g/dL (ref 13.0–17.0)
MCH: 33.3 pg (ref 26.0–34.0)
MCHC: 34.4 g/dL (ref 30.0–36.0)
MCV: 96.8 fL (ref 78.0–100.0)
PLATELETS: 155 10*3/uL (ref 150–400)
RBC: 4.65 MIL/uL (ref 4.22–5.81)
RDW: 12.6 % (ref 11.5–15.5)
WBC: 9.5 10*3/uL (ref 4.0–10.5)

## 2014-06-03 LAB — BASIC METABOLIC PANEL
ANION GAP: 8 (ref 5–15)
BUN: 19 mg/dL (ref 6–23)
CO2: 23 mmol/L (ref 19–32)
CREATININE: 1.37 mg/dL — AB (ref 0.50–1.35)
Calcium: 8.6 mg/dL (ref 8.4–10.5)
Chloride: 108 mmol/L (ref 96–112)
GFR calc Af Amer: 54 mL/min — ABNORMAL LOW (ref >90)
GFR calc non Af Amer: 46 mL/min — ABNORMAL LOW (ref >90)
Glucose, Bld: 105 mg/dL — ABNORMAL HIGH (ref 70–99)
Potassium: 4 mmol/L (ref 3.5–5.1)
SODIUM: 139 mmol/L (ref 135–145)

## 2014-06-03 LAB — TROPONIN I
TROPONIN I: 0.03 ng/mL (ref <0.031)
TROPONIN I: 0.03 ng/mL (ref <0.031)
Troponin I: 0.03 ng/mL (ref <0.031)

## 2014-06-03 LAB — TSH: TSH: 2.395 u[IU]/mL (ref 0.350–4.500)

## 2014-06-03 MED ORDER — PNEUMOCOCCAL VAC POLYVALENT 25 MCG/0.5ML IJ INJ
0.5000 mL | INJECTION | INTRAMUSCULAR | Status: AC
  Administered 2014-06-04: 0.5 mL via INTRAMUSCULAR
  Filled 2014-06-03: qty 0.5

## 2014-06-03 MED ORDER — OXYCODONE HCL 5 MG PO TABS
5.0000 mg | ORAL_TABLET | ORAL | Status: DC | PRN
  Administered 2014-06-04: 5 mg via ORAL
  Filled 2014-06-03: qty 1

## 2014-06-03 MED ORDER — HEPARIN (PORCINE) IN NACL 100-0.45 UNIT/ML-% IJ SOLN
1250.0000 [IU]/h | INTRAMUSCULAR | Status: DC
  Administered 2014-06-03 (×2): 1600 [IU]/h via INTRAVENOUS
  Filled 2014-06-03 (×4): qty 250

## 2014-06-03 MED ORDER — ONDANSETRON HCL 4 MG PO TABS: 4.0000 mg | ORAL_TABLET | Freq: Four times a day (QID) | ORAL | Status: DC | PRN

## 2014-06-03 MED ORDER — ACETAMINOPHEN 650 MG RE SUPP: 650.0000 mg | Freq: Four times a day (QID) | RECTAL | Status: DC | PRN

## 2014-06-03 MED ORDER — FLUTICASONE PROPIONATE 50 MCG/ACT NA SUSP
2.0000 | Freq: Every day | NASAL | Status: DC
  Filled 2014-06-03: qty 16

## 2014-06-03 MED ORDER — DOCUSATE SODIUM 100 MG PO CAPS
100.0000 mg | ORAL_CAPSULE | Freq: Two times a day (BID) | ORAL | Status: DC
  Administered 2014-06-03 – 2014-06-04 (×3): 100 mg via ORAL
  Filled 2014-06-03 (×4): qty 1

## 2014-06-03 MED ORDER — ACETAMINOPHEN 325 MG PO TABS: 650.0000 mg | ORAL_TABLET | Freq: Four times a day (QID) | ORAL | Status: DC | PRN

## 2014-06-03 MED ORDER — HEPARIN BOLUS VIA INFUSION
5000.0000 [IU] | Freq: Once | INTRAVENOUS | Status: AC
  Administered 2014-06-03: 5000 [IU] via INTRAVENOUS
  Filled 2014-06-03: qty 5000

## 2014-06-03 MED ORDER — AMLODIPINE BESYLATE 5 MG PO TABS
5.0000 mg | ORAL_TABLET | Freq: Every day | ORAL | Status: DC
  Administered 2014-06-03 – 2014-06-04 (×2): 5 mg via ORAL
  Filled 2014-06-03 (×2): qty 1

## 2014-06-03 MED ORDER — TAMSULOSIN HCL 0.4 MG PO CAPS
0.4000 mg | ORAL_CAPSULE | Freq: Every day | ORAL | Status: DC
  Administered 2014-06-03 – 2014-06-04 (×2): 0.4 mg via ORAL
  Filled 2014-06-03 (×2): qty 1

## 2014-06-03 MED ORDER — LEVOTHYROXINE SODIUM 125 MCG PO TABS
125.0000 ug | ORAL_TABLET | Freq: Every day | ORAL | Status: DC
  Administered 2014-06-03 – 2014-06-04 (×2): 125 ug via ORAL
  Filled 2014-06-03 (×3): qty 1

## 2014-06-03 MED ORDER — FAMOTIDINE 10 MG PO TABS
10.0000 mg | ORAL_TABLET | Freq: Every day | ORAL | Status: DC
  Administered 2014-06-03 – 2014-06-04 (×2): 10 mg via ORAL
  Filled 2014-06-03 (×2): qty 1

## 2014-06-03 MED ORDER — ASPIRIN EC 81 MG PO TBEC
81.0000 mg | DELAYED_RELEASE_TABLET | Freq: Every day | ORAL | Status: DC
  Administered 2014-06-03 – 2014-06-04 (×2): 81 mg via ORAL
  Filled 2014-06-03 (×2): qty 1

## 2014-06-03 MED ORDER — ROSUVASTATIN CALCIUM 10 MG PO TABS
10.0000 mg | ORAL_TABLET | Freq: Every day | ORAL | Status: DC
  Administered 2014-06-03: 10 mg via ORAL
  Filled 2014-06-03 (×2): qty 1

## 2014-06-03 MED ORDER — FUROSEMIDE 40 MG PO TABS
40.0000 mg | ORAL_TABLET | Freq: Every day | ORAL | Status: DC
  Administered 2014-06-03 – 2014-06-04 (×2): 40 mg via ORAL
  Filled 2014-06-03 (×2): qty 1

## 2014-06-03 MED ORDER — METOPROLOL SUCCINATE ER 25 MG PO TB24
25.0000 mg | ORAL_TABLET | Freq: Every day | ORAL | Status: DC
  Administered 2014-06-03 – 2014-06-04 (×2): 25 mg via ORAL
  Filled 2014-06-03 (×2): qty 1

## 2014-06-03 MED ORDER — SODIUM CHLORIDE 0.9 % IJ SOLN: 3.0000 mL | Freq: Two times a day (BID) | INTRAMUSCULAR | Status: DC

## 2014-06-03 MED ORDER — ONDANSETRON HCL 4 MG/2ML IJ SOLN: 4.0000 mg | Freq: Four times a day (QID) | INTRAMUSCULAR | Status: DC | PRN

## 2014-06-03 NOTE — Progress Notes (Signed)
Patient Demographics  Leonard Berg, is a 81 y.o. male, DOB - Jan 11, 1930, WLN:[id number]  Admit date - 06/02/2014   Admitting Physician Sid Falcon, MD  Outpatient Primary MD for the patient is Morton Peters, MD  LOS - 0   Chief Complaint  Patient presents with  . Shortness of Breath      Admission history of present illness/brief narrative: Leonard Berg is an 81yo man with PMH of CAD s/p CABG, DVT and PE in 2009, cataract (recent surgery 2 weeks ago), HTN, HLD, hypothyroidism, RAS, BPH who presents with worsening SOB over the last few days. He noted that he had surgery 2 weeks ago and has been pretty sedentary in that time, but yesterday he was out in the barn and when he came back in he was so short of breath and developed right sided chest pain with radiation down his arm and tingling in his hands. He told his wife he thought he should be "checked out" and they presented to the ED. He has a history of a PE in 2009 and he was treated for a time with coumadin and then this was stopped. He denies any nausea, vomiting, recent URI, swelling in the legs, erythema in the legs. He has chronic pain in the legs due to osteoarthritis and some chronic bilateral swelling. He further has chronic constipation.  In the ED, a d dimer was elevated and a CT of the chest revealed bilateral PE. He was started on a heparin drip.    Subjective:   Leonard Berg today has, No headache, No chest pain, No abdominal pain - No Nausea, No new weakness tingling or numbness, No Cough - SOB.   Assessment & Plan    Principal Problem:   PE (pulmonary embolism) Active Problems:   Hypothyroidism   Mixed hyperlipidemia   Essential hypertension   Coronary atherosclerosis of native coronary artery   Osteoarthritis   BPH (benign prostatic hyperplasia)   Pulmonary  embolism  PE (pulmonary embolism) - Continue with heparin drip - LE dopplers pending - Telemetry - he reports no history of Afib. If he does have pAF while in house, he may need lifelong anticoagulation.  - Right heart strain on CT scan, requested pulmonary consult , will transition to oral anticoagulation if okay by pulmonary. - We'll check 2-D echo.  Coronary atherosclerosis of native coronary artery s/p CABG with chest pain (also with grade 1 diastolic heart failure) - patient with chest pain, but in the setting of PE -  CE negative 2 - Continue aspirin, statin, BP medications - On heparin drip for above  Hyperkalemia - Resolved  CKD - Unclear baseline, last labs in our system in 2012, recheck in the morning - Would only start low rate of fluids given h/o grade 1 diastolic HF if needed. - Improved, creatinine is 1.37 today  Hypothyroidism - TSH 12.395 - Continue synthroid   Mixed hyperlipidemia - Continue statin, last LDL checked in our system was 96   Essential hypertension - He is on metoprolol, lasix, amlodipine, continue these, monitor on telemetry   Osteoarthritis - He is on roxicet for pain at home, started Oxy IR while inpatient for pain - Colace for constipation   BPH (benign  prostatic hyperplasia) - Continue tamsulosin   Code Status: DO NOT RESUSCITATE  Family Communication: None at bedside  Disposition Plan: Home once stable   Procedures  None   Consults   Pulmonary   Medications  Scheduled Meds: . amLODipine  5 mg Oral Daily  . aspirin EC  81 mg Oral Daily  . docusate sodium  100 mg Oral BID  . famotidine  10 mg Oral Daily  . fluticasone  2 spray Each Nare Daily  . furosemide  40 mg Oral Daily  . levothyroxine  125 mcg Oral QAC breakfast  . metoprolol succinate  25 mg Oral Daily  . [START ON 06/04/2014] pneumococcal 23 valent vaccine  0.5 mL Intramuscular Tomorrow-1000  . rosuvastatin  10 mg Oral QHS  . sodium chloride  3 mL  Intravenous Q12H  . tamsulosin  0.4 mg Oral Daily   Continuous Infusions: . heparin 1,600 Units/hr (06/03/14 0111)   PRN Meds:.acetaminophen **OR** acetaminophen, ondansetron **OR** ondansetron (ZOFRAN) IV, oxyCODONE  DVT Prophylaxis  on heparin drip  Lab Results  Component Value Date   PLT 155 06/03/2014    Antibiotics    Anti-infectives    None          Objective:   Filed Vitals:   06/03/14 0110 06/03/14 0202 06/03/14 0538 06/03/14 1018  BP: 154/82 163/74 125/72 126/64  Pulse:  74 80 80  Temp:  97.8 F (36.6 C) 98 F (36.7 C)   TempSrc:  Oral Oral   Resp: 17 16 20    Height:  6\' 2"  (1.88 m)    Weight:  105.6 kg (232 lb 12.9 oz)    SpO2: 97% 95% 90%     Wt Readings from Last 3 Encounters:  06/03/14 105.6 kg (232 lb 12.9 oz)  02/23/14 106.65 kg (235 lb 1.9 oz)  10/12/13 110.224 kg (243 lb)     Intake/Output Summary (Last 24 hours) at 06/03/14 1035 Last data filed at 06/03/14 1020  Gross per 24 hour  Intake    360 ml  Output    400 ml  Net    -40 ml     Physical Exam  Awake Alert, Oriented X 3, No new F.N deficits, Normal affect [place].AT,PERRAL Supple Neck,No JVD, No cervical lymphadenopathy appriciated.  Symmetrical Chest wall movement, Good air movement bilaterally, CTAB RRR,No Gallops,Rubs or new Murmurs, No Parasternal Heave +ve B.Sounds, Abd Soft, No tenderness, No organomegaly appriciated, No rebound - guarding or rigidity. No Cyanosis, Clubbing or edema, No new Rash or bruise     Data Review   Micro Results No results found for this or any previous visit (from the past 240 hour(s)).  Radiology Reports Dg Chest 2 View  06/02/2014   CLINICAL DATA:  SOB. Left side chest pain. Hx bypass surgery, HTN, PE, and CAD.  EXAM: CHEST  2 VIEW  COMPARISON:  01/27/2012  FINDINGS: Lungs are mildly hyperexpanded. There is some minor reticular scarring at the lung bases, stable. No lung consolidation or edema. No pleural effusion or pneumothorax.  Changes  from CABG surgery are stable. Cardiac silhouette is normal in size and configuration. No mediastinal or hilar masses or evidence of adenopathy.  Bony thorax is demineralized but grossly intact.  IMPRESSION: No acute cardiopulmonary disease.   Electronically Signed   By: Lajean Manes M.D.   On: 06/02/2014 22:55   Ct Angio Chest Pe W/cm &/or Wo Cm  06/03/2014   CLINICAL DATA:  Presents with sudden onset of SOB and weakness begna  this evening. Unable to walk short distance without feeling winded and tired-abnormal for patient. Pt has elevated HR-denies pain. Breath sounds diminished, sats 91% RA, no pedal or lower extremity noted. HX of PEs  EXAM: CT ANGIOGRAPHY CHEST WITH CONTRAST  TECHNIQUE: Multidetector CT imaging of the chest was performed using the standard protocol during bolus administration of intravenous contrast. Multiplanar CT image reconstructions and MIPs were obtained to evaluate the vascular anatomy.  CONTRAST:  46mL OMNIPAQUE IOHEXOL 350 MG/ML SOLN  COMPARISON:  11/17/2009  FINDINGS: Angiographic study: There are bilateral pulmonary emboli. Pulmonary embolus extends from the peripheral right pulmonary artery into the right upper car right middle and lower lobe segmental branches. There also left pulmonary artery emboli lying at the origin of the left upper lobe pulmonary embolus and extending into the left lower lobe segmental branches. There is a greater burden of pulmonary emboli on the right than on the left.  Right ventricular to left ventricular ratio is 1.2 suggesting right heart strain.  Mediastinum: Heart is normal in size. Changes from CABG surgery. There are moderate coronary artery calcifications. Aorta is normal in caliber. Atherosclerotic calcification is noted along the thoracic aorta. No mediastinal or hilar masses. There are few prominent mediastinal lymph nodes, largest is a left paratracheal node just above the carina measuring 1 cm in short axis. No hilar masses or enlarged lymph  nodes.  Lungs and pleura: Linear and reticular opacities in the lower lobes and base of the right middle lobe and lingula of the left upper lobe consistent with subsegmental atelectasis. No lung consolidation or edema. No pleural effusion. No mass or suspicious nodule. Calcified granuloma noted in the lateral right lower lobe.  Limited upper abdomen: Mild bilateral renal cortical thinning. No acute findings.  Musculoskeletal: Bony thorax is demineralized. There are degenerative changes along the visualized spine. No osteoblastic or osteolytic lesions.  Review of the MIP images confirms the above findings.  IMPRESSION: 1. Bilateral pulmonary emboli as described. No evidence of pulmonary infarction. Lungs show subsegmental atelectasis. There is no pleural effusion. 2. Right ventricular to left ventricular ratios 1.2 consistent with right heart strain. Positive for acute PE with CT evidence of right heartstrain (RV/LV Ratio = 1.2) consistent with at least submassive (intermediate risk)PE. The presence of right heart strain has been associated with anincreased risk of morbidity and mortality. Consultation with [facility] is recommended. Critical Value/emergent results were called by telephone at the time of interpretation on 06/03/2014 at 12:13 am to [facility], who verbally acknowledged these results.   Electronically Signed   By: Lajean Manes M.D.   On: 06/03/2014 00:14    CBC  Recent Labs Lab 06/02/14 2150 06/03/14 0359  WBC 12.6* 9.5  HGB 16.6 15.5  HCT 48.4 45.0  PLT 151 155  MCV 95.7 96.8  MCH 32.8 33.3  MCHC 34.3 34.4  RDW 12.6 12.6    Chemistries   Recent Labs Lab 06/02/14 2150 06/02/14 2313 06/03/14 0359  NA 136  --  139  K 5.8* 4.0 4.0  CL 104  --  108  CO2 25  --  23  GLUCOSE 159*  --  105*  BUN 22  --  19  CREATININE 1.57*  --  1.37*  CALCIUM 8.7  --  8.6  AST 54*  --   --   ALT 25  --   --   ALKPHOS 60  --   --   BILITOT 2.0*  --   --     ------------------------------------------------------------------------------------------------------------------  estimated creatinine clearance is 53.9 mL/min (by C-G formula based on Cr of 1.37). ------------------------------------------------------------------------------------------------------------------ No results for input(s): HGBA1C in the last 72 hours. ------------------------------------------------------------------------------------------------------------------ No results for input(s): CHOL, HDL, LDLCALC, TRIG, CHOLHDL, LDLDIRECT in the last 72 hours. ------------------------------------------------------------------------------------------------------------------  Recent Labs  06/03/14 0357  TSH 2.395   ------------------------------------------------------------------------------------------------------------------ No results for input(s): VITAMINB12, FOLATE, FERRITIN, TIBC, IRON, RETICCTPCT in the last 72 hours.  Coagulation profile No results for input(s): INR, PROTIME in the last 168 hours.   Recent Labs  06/02/14 2150  DDIMER 17.50*    Cardiac Enzymes  Recent Labs Lab 06/03/14 0359  TROPONINI 0.03   ------------------------------------------------------------------------------------------------------------------ Invalid input(s): POCBNP     Time Spent in minutes   30 minutes   ELGERGAWY, DAWOOD M.D on 06/03/2014 at 10:35 AM  Between 7am to 7pm - Pager - [phone]  After 7pm go to [email or web address] - password [email or web address]  And look for the night coverage person covering for me after hours  [facility] Office  [phone]   **Disclaimer: This note may have been dictated with voice recognition software. Similar sounding words can inadvertently be transcribed and this note may contain transcription errors which may not have been corrected upon publication of note.**

## 2014-06-03 NOTE — Progress Notes (Signed)
ANTICOAGULATION CONSULT NOTE - Follow Up Consult  Pharmacy Consult for Heparin  Indication: pulmonary embolus  No Known Allergies  Patient Measurements: Height: 6\' 2"  (188 cm) Weight: 250 lb (113.399 kg) IBW/kg (Calculated) : 82.2  Vital Signs: Temp: 97.7 F (36.5 C) (02/20 2124) Temp Source: Oral (02/20 2124) BP: 133/69 mmHg (02/20 2300) Pulse Rate: 83 (02/20 2300)  Labs:  Recent Labs  06/02/14 2150  HGB 16.6  HCT 48.4  PLT 151  CREATININE 1.57*    Estimated Creatinine Clearance: 48.6 mL/min (by C-G formula based on Cr of 1.57).   Assessment: 81 y/o M with bilateral PE per CT Angio, D-Dimer elevated, mild renal dysfunction, CBC good, other labs as above.   Goal of Therapy:  Heparin level 0.3-0.7 units/ml Monitor platelets by anticoagulation protocol: Yes   Plan:  -Heparin 5000 units BOLUS -Start heparin drip at 1600 units/hr -0900 HL -Daily CBC/HL -Monitor for bleeding  Narda Bonds 06/03/2014,12:21 AM

## 2014-06-03 NOTE — Progress Notes (Signed)
06/03/2014 02:12 AM The patient as arrived from the ER. Here for pulmonary embolism, the patient is on a Heparin drip of 1600 Units/hr. The patient is alert and oriented.  Will monitor the patient. Lupita Dawn

## 2014-06-03 NOTE — Progress Notes (Signed)
ANTICOAGULATION CONSULT NOTE - Follow Up Consult  Pharmacy Consult for heparin Indication: pulmonary embolus  No Known Allergies  Patient Measurements: Height: 6\' 2"  (188 cm) Weight: 232 lb 12.9 oz (105.6 kg) IBW/kg (Calculated) : 82.2 Heparin Dosing Weight: 103 kg  Vital Signs: Temp: 98.6 F (37 C) (02/21 2027) Temp Source: Oral (02/21 2027) BP: 124/65 mmHg (02/21 2027) Pulse Rate: 83 (02/21 2027)  Labs:  Recent Labs  06/02/14 2150 06/03/14 0359 06/03/14 1130 06/03/14 1750 06/03/14 2003  HGB 16.6 15.5  --   --   --   HCT 48.4 45.0  --   --   --   PLT 151 155  --   --   --   HEPARINUNFRC  --   --  0.52  --  0.75*  CREATININE 1.57* 1.37*  --   --   --   TROPONINI  --  0.03 0.03 0.03  --     Estimated Creatinine Clearance: 53.9 mL/min (by C-G formula based on Cr of 1.37).   Medications:  Scheduled:  . amLODipine  5 mg Oral Daily  . aspirin EC  81 mg Oral Daily  . docusate sodium  100 mg Oral BID  . famotidine  10 mg Oral Daily  . fluticasone  2 spray Each Nare Daily  . furosemide  40 mg Oral Daily  . levothyroxine  125 mcg Oral QAC breakfast  . metoprolol succinate  25 mg Oral Daily  . [START ON 06/04/2014] pneumococcal 23 valent vaccine  0.5 mL Intramuscular Tomorrow-1000  . rosuvastatin  10 mg Oral QHS  . sodium chloride  3 mL Intravenous Q12H  . tamsulosin  0.4 mg Oral Daily   Infusions:  . heparin 1,600 Units/hr (06/03/14 1550)    Assessment: 81 yo male with new PE is currently on supratherapeutic heparin.  Heparin level is 0.75 Goal of Therapy:  Heparin level 0.3-0.7 units/ml Monitor platelets by anticoagulation protocol: Yes   Plan:  - reduce heparin drip to 1450 units/hr - 8hr heparin level  Meleana Commerford, Tsz-Yin 06/03/2014,9:07 PM

## 2014-06-03 NOTE — Consult Note (Signed)
PULMONARY / CRITICAL CARE MEDICINE   Name: Leonard Gelles [facility] MRN: [id number] DOB: 12-01-1929    ADMISSION DATE:  06/02/2014 CONSULTATION DATE:  06/03/14  REFERRING MD : Dr Elgergawy/ [facility]  CHIEF COMPLAINT:  Cp/sob  INITIAL PRESENTATION:  81 yowm remote smoker with multiple orthopedic procedures both LE and h/o PE in  2009 on coumadin per Dr Hardin Negus in [place] transiently but fine off it until acute cp/sob 2/20 > to ER where dx with multiple PE with RV strain and pccm service asked to consult formally am 2/22   STUDIES:  CTa 06/02/14> bilateral pe with rv strain Echo 2/22 >>> Venous dopplers 2/22 >>>  SIGNIFICANT EVENTS: Started on IV hep 2/21 12:21AM    HISTORY OF PRESENT ILLNESS:  81yo man with PMH of CAD s/p CABG, DVT and PE in 2009, cataract (recent surgery 2 weeks pta), HTN, HLD, hypothyroidism, RAS, BPH who presented with worsening SOB over the last few days. He noted that he had surgery 2 weeks pta and has been pretty sedentary in that time, but one day pta he was out in the barn and when he came back in he was so short of breath and developed non pleuritic  left  sided chest pain with radiation down his arm and tingling in his hands. He told his wife he thought he should be "checked out" and they presented to the ED. He has a history of a PE in 2009 and he was treated for a time with coumadin and then this was stopped. He denies any nausea, vomiting, recent URI, swelling in the legs, erythema in the legs. He has chronic pain in the legs due to osteoarthritis and some chronic bilateral swelling L > R    In the ED, a d dimer was elevated and a CT of the chest revealed bilateral PE. He was started on a heparin drip.    No obvious day to day or daytime variabilty or assoc chronic cough or  chest tightness, subjective wheeze overt sinus or hb symptoms. No unusual exp hx or h/o childhood pna/ asthma or knowledge of premature birth.  Sleeping ok without nocturnal   or early am exacerbation  of respiratory  c/o's or need for noct saba. Also denies any obvious fluctuation of symptoms with weather or environmental changes or other aggravating or alleviating factors except as outlined above   Current Medications, Allergies, Complete Past Medical History, Past Surgical History, Family History, and Social History were reviewed in [employer] record.  ROS  The following are not active complaints unless bolded sore throat, dysphagia, dental problems, itching, sneezing,  nasal congestion or excess/ purulent secretions, ear ache,   fever, chills, sweats, unintended wt loss, pleuritic or exertional cp, hemoptysis,  orthopnea pnd or leg swelling, presyncope, palpitations, heartburn, abdominal pain, anorexia, nausea, vomiting, diarrhea  or change in bowel or urinary habits, change in stools or urine, dysuria,hematuria,  rash, arthralgias, visual complaints, headache, numbness weakness or ataxia or problems with walking or coordination,  change in mood/affect or memory.         PAST MEDICAL HISTORY :   has a past medical history of Hypertension; Hyperlipidemia; Pulmonary embolism (2009); DJD (degenerative joint disease); Arrhythmia; RBBB (right bundle branch block); Unstable angina; Arthritis; Anemia; Thrombocytopenia; History of BPH; Thyroid disease; DVT (deep venous thrombosis); Coronary artery disease (8/11); Chronic back pain; Cataracts, bilateral; Renal artery stenosis; and Hypothyroidism.  has past surgical history that includes Replacement total knee (2006); Coronary artery bypass graft (  11-21-09); Mandible surgery; Foot surgery; Renal artery stent (2010); Total hip arthroplasty; Endarterectomy (01/28/2012); Endarterectomy (03/15/2012); and Carotid endarterectomy. Prior to Admission medications   Medication Sig Start Date End Date Taking? Authorizing Provider  aspirin 81 MG tablet Take 81 mg by mouth daily.     Yes Historical Provider, MD   fluticasone (FLONASE) 50 MCG/ACT nasal spray Place 2 sprays into the nose as needed. 02/04/12  Yes Historical Provider, MD  furosemide (LASIX) 40 MG tablet Take 40 mg by mouth daily.   Yes Historical Provider, MD  levothyroxine (SYNTHROID, LEVOTHROID) 125 MCG tablet Take 125 mcg by mouth daily.     Yes Historical Provider, MD  metoprolol succinate (TOPROL XL) 25 MG 24 hr tablet Take 1 tablet (25 mg total) by mouth daily. 03/31/13  Yes Blane Ohara, MD  Multiple Vitamins-Minerals (CENTRUM SILVER PO) Take 1 tablet by mouth daily.     Yes Historical Provider, MD  Ranitidine HCl (ZANTAC 75 PO) Take 1 tablet by mouth daily as needed. For heart burn   Yes Historical Provider, MD  rosuvastatin (CRESTOR) 10 MG tablet Take 10 mg by mouth at bedtime.   Yes Historical Provider, MD  Tamsulosin HCl (FLOMAX) 0.4 MG CAPS Take 0.4 mg by mouth daily.     Yes Historical Provider, MD  oxyCODONE-acetaminophen (ROXICET) 5-325 MG per tablet Take 1 tablet by mouth every 4 (four) hours as needed for pain. Patient not taking: Reported on 06/02/2014 03/15/12   Ulyses Amor, PA-C   No Known Allergies  FAMILY HISTORY:  indicated that his mother is deceased. He indicated that his father is deceased. He indicated that his brother is deceased.  SOCIAL HISTORY:  reports that he quit smoking about 40 years ago. He quit smokeless tobacco use about 12 years ago. He reports that he drinks about 1.2 oz of alcohol per week. He reports that he does not use illicit drugs.     SUBJECTIVE:  Pleasant wm able to lie back at 30 degrees on RA  VITAL SIGNS: Temp:  [97.7 F (36.5 C)-98 F (36.7 C)] 98 F (36.7 C) (02/21 0538) Pulse Rate:  [60-98] 80 (02/21 1018) Resp:  [14-22] 20 (02/21 0538) BP: (113-163)/(60-82) 126/64 mmHg (02/21 1018) SpO2:  [90 %-97 %] 90 % (02/21 0538) Weight:  [232 lb 12.9 oz (105.6 kg)-250 lb (113.399 kg)] 232 lb 12.9 oz (105.6 kg) (02/21 0202) HEMODYNAMICS:   VENTILATOR SETTINGS:   INTAKE /  OUTPUT:  Intake/Output Summary (Last 24 hours) at 06/03/14 1057 Last data filed at 06/03/14 1020  Gross per 24 hour  Intake    360 ml  Output    400 ml  Net    -40 ml    PHYSICAL EXAMINATION: General: pleasant wm nad  Pt alert, approp nad @ 30 degrees HOB No jvd Oropharynx clear Neck supple Lungs clear bilaterally to A and P  RRR no s3 or or sign murmur or audible increase in P2  Abd soft and benign  Extr wam with slt increase L > R leg circumference, no calf tenderness, neg homan's no pitting  Neuro  No motor deficits   LABS:  CBC  Recent Labs Lab 06/02/14 2150 06/03/14 0359  WBC 12.6* 9.5  HGB 16.6 15.5  HCT 48.4 45.0  PLT 151 155   Coag's No results for input(s): APTT, INR in the last 168 hours. BMET  Recent Labs Lab 06/02/14 2150 06/02/14 2313 06/03/14 0359  NA 136  --  139  K 5.8* 4.0  4.0  CL 104  --  108  CO2 25  --  23  BUN 22  --  19  CREATININE 1.57*  --  1.37*  GLUCOSE 159*  --  105*   Electrolytes  Recent Labs Lab 06/02/14 2150 06/03/14 0359  CALCIUM 8.7 8.6   Sepsis Markers No results for input(s): LATICACIDVEN, PROCALCITON, O2SATVEN in the last 168 hours. ABG No results for input(s): PHART, PCO2ART, PO2ART in the last 168 hours. Liver Enzymes  Recent Labs Lab 06/02/14 2150  AST 54*  ALT 25  ALKPHOS 60  BILITOT 2.0*  ALBUMIN 3.7   Cardiac Enzymes  Recent Labs Lab 06/03/14 0359  TROPONINI 0.03   Glucose No results for input(s): GLUCAP in the last 168 hours.  Imaging Dg Chest 2 View  06/02/2014   CLINICAL DATA:  SOB. Left side chest pain. Hx bypass surgery, HTN, PE, and CAD.  EXAM: CHEST  2 VIEW  COMPARISON:  01/27/2012  FINDINGS: Lungs are mildly hyperexpanded. There is some minor reticular scarring at the lung bases, stable. No lung consolidation or edema. No pleural effusion or pneumothorax.  Changes from CABG surgery are stable. Cardiac silhouette is normal in size and configuration. No mediastinal or hilar masses or  evidence of adenopathy.  Bony thorax is demineralized but grossly intact.  IMPRESSION: No acute cardiopulmonary disease.   Electronically Signed   By: Lajean Manes M.D.   On: 06/02/2014 22:55   Ct Angio Chest Pe W/cm &/or Wo Cm  06/03/2014   CLINICAL DATA:  Presents with sudden onset of SOB and weakness begna this evening. Unable to walk short distance without feeling winded and tired-abnormal for patient. Pt has elevated HR-denies pain. Breath sounds diminished, sats 91% RA, no pedal or lower extremity noted. HX of PEs  EXAM: CT ANGIOGRAPHY CHEST WITH CONTRAST  TECHNIQUE: Multidetector CT imaging of the chest was performed using the standard protocol during bolus administration of intravenous contrast. Multiplanar CT image reconstructions and MIPs were obtained to evaluate the vascular anatomy.  CONTRAST:  13mL OMNIPAQUE IOHEXOL 350 MG/ML SOLN  COMPARISON:  11/17/2009  FINDINGS: Angiographic study: There are bilateral pulmonary emboli. Pulmonary embolus extends from the peripheral right pulmonary artery into the right upper car right middle and lower lobe segmental branches. There also left pulmonary artery emboli lying at the origin of the left upper lobe pulmonary embolus and extending into the left lower lobe segmental branches. There is a greater burden of pulmonary emboli on the right than on the left.  Right ventricular to left ventricular ratio is 1.2 suggesting right heart strain.  Mediastinum: Heart is normal in size. Changes from CABG surgery. There are moderate coronary artery calcifications. Aorta is normal in caliber. Atherosclerotic calcification is noted along the thoracic aorta. No mediastinal or hilar masses. There are few prominent mediastinal lymph nodes, largest is a left paratracheal node just above the carina measuring 1 cm in short axis. No hilar masses or enlarged lymph nodes.  Lungs and pleura: Linear and reticular opacities in the lower lobes and base of the right middle lobe and  lingula of the left upper lobe consistent with subsegmental atelectasis. No lung consolidation or edema. No pleural effusion. No mass or suspicious nodule. Calcified granuloma noted in the lateral right lower lobe.  Limited upper abdomen: Mild bilateral renal cortical thinning. No acute findings.  Musculoskeletal: Bony thorax is demineralized. There are degenerative changes along the visualized spine. No osteoblastic or osteolytic lesions.  Review of the MIP images confirms the  above findings.  IMPRESSION: 1. Bilateral pulmonary emboli as described. No evidence of pulmonary infarction. Lungs show subsegmental atelectasis. There is no pleural effusion. 2. Right ventricular to left ventricular ratios 1.2 consistent with right heart strain. Positive for acute PE with CT evidence of right heartstrain (RV/LV Ratio = 1.2) consistent with at least submassive (intermediate risk)PE. The presence of right heart strain has been associated with anincreased risk of morbidity and mortality. Consultation with [facility] is recommended. Critical Value/emergent results were called by telephone at the time of interpretation on 06/03/2014 at 12:13 am to [facility], [facility], who verbally acknowledged these results.   Electronically Signed   By: Lajean Manes M.D.   On: 06/03/2014 00:14     ASSESSMENT / PLAN:  PULMONARY 1) Acute bilateral PE with moderate clot burden / rv strain but clearly tolerating well with no tachycardia/ no desats to suggest needs thrombolytics esp given the risk of bleeding  - echo pending but I would be very surprised if shows sign RV dysfunction.  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      Christinia Gully, MD Pulmonary and [place] [phone] After 5:30 PM or weekends, call [phone]

## 2014-06-03 NOTE — Progress Notes (Signed)
Utilization Review Completed.Leonard Berg T2/21/2016  

## 2014-06-03 NOTE — ED Notes (Signed)
Admitting MD at bedside.

## 2014-06-03 NOTE — H&P (Signed)
[facility] History and Physical  Elsworth Ledin Sr. TKP:[id number] DOB: 1929/10/18 DOA: 06/02/2014  Referring physician: ED physician PCP: Morton Peters, MD   Chief Complaint: SOB and chest pain  HPI:  Mr. Gerke is an 81yo man with PMH of CAD s/p CABG, DVT and PE in 2009, cataract (recent surgery 2 weeks ago), HTN, HLD, hypothyroidism, RAS, BPH who presents with worsening SOB over the last few days.  He noted that he had surgery 2 weeks ago and has been pretty sedentary in that time, but yesterday he was out in the barn and when he came back in he was so short of breath and developed right sided chest pain with radiation down his arm and tingling in his hands.  He told his wife he thought he should be "checked out" and they presented to the ED.  He has a history of a PE in 2009 and he was treated for a time with coumadin and then this was stopped.  He denies any nausea, vomiting, recent URI, swelling in the legs, erythema in the legs.  He has chronic pain in the legs due to osteoarthritis and some chronic bilateral swelling.  He further has chronic constipation.    In the ED, a d dimer was elevated and a CT of the chest revealed bilateral PE.  He was started on a heparin drip.   Assessment and Plan:  PE (pulmonary embolism) - He will be started on a heparin drip - Discuss with patient risks/benefits of coumadin vs. xarelto for therapy - LE dopplers tomorrow - Heparin per pharmacy - Telemetry - he reports no history of Afib.  If he does have pAF while in house, he may need lifelong anticoagulation.  - Consider getting records about previous PE in 2009 in the AM - Right heart strain on CT scan, consider consulting pulmonary med in the AM, consider TTE; spoke briefly with E-link physician and we reviewed images.   Coronary atherosclerosis of native coronary artery s/p CABG with chest pain (also with grade 1 diastolic heart failure) - patient with chest pain, but in  the setting of PE - First CE negative - Cycle CE, repeat EKG in the AM - Continue aspirin, statin, BP medications - On heparin drip for above  Hyperkalemia - On BMET at 2150, K was 5.8.  Recheck was 4.0 at 2313.  - Recheck BMET in the AM - Treat with insulin, Ca, EKG, kayexalate if needed  CKD - Unclear baseline, last labs in our system in 2012, recheck in the morning - Would only start low rate of fluids given h/o grade 1 diastolic HF if needed. - Cr 1.57.    Hypothyroidism - Check TSH - Continue synthroid    Mixed hyperlipidemia - Continue statin, last LDL checked in our system was 96    Essential hypertension - He is on metoprolol, lasix, amlodipine, continue these, monitor on telemetry    Osteoarthritis - He is on roxicet for pain at home, started Oxy IR while inpatient for pain - Colace for constipation    BPH (benign prostatic hyperplasia) - Continue tamsulosin   Radiological Exams on Admission: Dg Chest 2 View  06/02/2014   CLINICAL DATA:  SOB. Left side chest pain. Hx bypass surgery, HTN, PE, and CAD.  EXAM: CHEST  2 VIEW  COMPARISON:  01/27/2012  FINDINGS: Lungs are mildly hyperexpanded. There is some minor reticular scarring at the lung bases, stable. No lung consolidation or edema. No pleural effusion or  pneumothorax.  Changes from CABG surgery are stable. Cardiac silhouette is normal in size and configuration. No mediastinal or hilar masses or evidence of adenopathy.  Bony thorax is demineralized but grossly intact.  IMPRESSION: No acute cardiopulmonary disease.   Electronically Signed   By: Lajean Manes M.D.   On: 06/02/2014 22:55   Ct Angio Chest Pe W/cm &/or Wo Cm  06/03/2014   CLINICAL DATA:  Presents with sudden onset of SOB and weakness begna this evening. Unable to walk short distance without feeling winded and tired-abnormal for patient. Pt has elevated HR-denies pain. Breath sounds diminished, sats 91% RA, no pedal or lower extremity noted. HX of PEs   EXAM: CT ANGIOGRAPHY CHEST WITH CONTRAST  TECHNIQUE: Multidetector CT imaging of the chest was performed using the standard protocol during bolus administration of intravenous contrast. Multiplanar CT image reconstructions and MIPs were obtained to evaluate the vascular anatomy.  CONTRAST:  73mL OMNIPAQUE IOHEXOL 350 MG/ML SOLN  COMPARISON:  11/17/2009  FINDINGS: Angiographic study: There are bilateral pulmonary emboli. Pulmonary embolus extends from the peripheral right pulmonary artery into the right upper car right middle and lower lobe segmental branches. There also left pulmonary artery emboli lying at the origin of the left upper lobe pulmonary embolus and extending into the left lower lobe segmental branches. There is a greater burden of pulmonary emboli on the right than on the left.  Right ventricular to left ventricular ratio is 1.2 suggesting right heart strain.  Mediastinum: Heart is normal in size. Changes from CABG surgery. There are moderate coronary artery calcifications. Aorta is normal in caliber. Atherosclerotic calcification is noted along the thoracic aorta. No mediastinal or hilar masses. There are few prominent mediastinal lymph nodes, largest is a left paratracheal node just above the carina measuring 1 cm in short axis. No hilar masses or enlarged lymph nodes.  Lungs and pleura: Linear and reticular opacities in the lower lobes and base of the right middle lobe and lingula of the left upper lobe consistent with subsegmental atelectasis. No lung consolidation or edema. No pleural effusion. No mass or suspicious nodule. Calcified granuloma noted in the lateral right lower lobe.  Limited upper abdomen: Mild bilateral renal cortical thinning. No acute findings.  Musculoskeletal: Bony thorax is demineralized. There are degenerative changes along the visualized spine. No osteoblastic or osteolytic lesions.  Review of the MIP images confirms the above findings.  IMPRESSION: 1. Bilateral pulmonary  emboli as described. No evidence of pulmonary infarction. Lungs show subsegmental atelectasis. There is no pleural effusion. 2. Right ventricular to left ventricular ratios 1.2 consistent with right heart strain. Positive for acute PE with CT evidence of right heartstrain (RV/LV Ratio = 1.2) consistent with at least submassive (intermediate risk)PE. The presence of right heart strain has been associated with anincreased risk of morbidity and mortality. Consultation with [place] is recommended. Critical Value/emergent results were called by telephone at the time of interpretation on 06/03/2014 at 12:13 am to [facility], PA, who verbally acknowledged these results.   Electronically Signed   By: Lajean Manes M.D.   On: 06/03/2014 00:14     Code Status: DNR, confirmed with patient and wife Family Communication: Pt and wife at bedside Disposition Plan: Admit for further evaluation    Gilles Chiquito, MD [phone]   Review of Systems:  Constitutional: Negative for fever, chills and malaise/fatigue. Negative for diaphoresis.  HENT: Negative for hearing loss, ear pain, nosebleeds, congestion Eyes: Negative for blurred vision, double vision, photophobia Respiratory: +  for SOB and DOE Negative for cough, hemoptysis, sputum production, wheezing and stridor.   Cardiovascular: + for chest pain with radiation Negative for , palpitations, orthopnea, claudication and leg swelling.  Gastrointestinal: + for occasional dyspepsia and heartburn, + for constipation Negative for nausea, vomiting and abdominal pain. Negative for blood in stool and melena.  Genitourinary: Negative for dysuria, urgency, frequency, hematuria and flank pain.  Musculoskeletal: + for joint pain in many joints, chronic leg pain Negative for myalgias, back pain and falls.  Skin: Negative for itching and rash.  Neurological: Negative for dizziness and weakness. Psychiatric/Behavioral: The patient is not nervous/anxious.       Past Medical History  Diagnosis Date  . Hypertension   . Hyperlipidemia   . Pulmonary embolism 2009    hx of  . DJD (degenerative joint disease)   . Arrhythmia     PAROXYSMAL ATRIAL FIBRILLATION  . RBBB (right bundle branch block)   . Unstable angina   . Arthritis     OSTEOARTHRITIS  . Anemia   . Thrombocytopenia   . History of BPH   . Thyroid disease     HYPOTHYROIDISM  . DVT (deep venous thrombosis)   . Coronary artery disease 8/11    CABG...LM EMERGENT WITH IABP sees Dr. Legrand Como cooper  . Chronic back pain   . Cataracts, bilateral   . Renal artery stenosis   . Hypothyroidism   Patient denies h/o Afib  Past Surgical History  Procedure Laterality Date  . Replacement total knee  2006    RIGHT  . Coronary artery bypass graft  11-21-09    EMERGENT X 3 GRAFTING. ..[name], MD, cc: [name]  . Mandible surgery    . Foot surgery    . Renal artery stent  2010  . Total hip arthroplasty      right  . Endarterectomy  01/28/2012    Procedure: ENDARTERECTOMY CAROTID;  Surgeon: [name], MD;  Location: [facility];  Service: Vascular;  Laterality: Right;  with Primary Closure of Artery  . Endarterectomy  03/15/2012    Procedure: ENDARTERECTOMY CAROTID;  Surgeon: [name], MD;  Location: [facility] OR;  Service: Vascular;  Laterality: Left;  . Carotid endarterectomy      Social History:  reports that he quit smoking about 40 years ago. He quit smokeless tobacco use about 12 years ago. He reports that he drinks about 1.2 oz of alcohol per week. He reports that he does not use illicit drugs.  No Known Allergies  Family History  Problem Relation Age of Onset  . Lung cancer Father 77    deceased   . Cancer Father 71    LUNG  . Stroke Mother 23    DECEASED   . Hyperlipidemia Mother   . Heart attack Brother 5    deceased  . Diabetes Brother   . Liver cancer Sister 75    deceased   . Cancer Sister     Prior to Admission medications    Medication Sig Start Date End Date Taking? Authorizing Provider  aspirin 81 MG tablet Take 81 mg by mouth daily.     Yes Historical Provider, MD  fluticasone (FLONASE) 50 MCG/ACT nasal spray Place 2 sprays into the nose as needed. 02/04/12  Yes Historical Provider, MD  furosemide (LASIX) 40 MG tablet Take 40 mg by mouth daily.   Yes Historical Provider, MD  levothyroxine (SYNTHROID, LEVOTHROID) 125 MCG tablet Take 125 mcg by mouth daily.  Yes Historical Provider, MD  metoprolol succinate (TOPROL XL) 25 MG 24 hr tablet Take 1 tablet (25 mg total) by mouth daily. 03/31/13  Yes Blane Ohara, MD  Multiple Vitamins-Minerals (CENTRUM SILVER PO) Take 1 tablet by mouth daily.     Yes Historical Provider, MD  Ranitidine HCl (ZANTAC 75 PO) Take 1 tablet by mouth daily as needed. For heart burn   Yes Historical Provider, MD  rosuvastatin (CRESTOR) 10 MG tablet Take 10 mg by mouth at bedtime.   Yes Historical Provider, MD  Tamsulosin HCl (FLOMAX) 0.4 MG CAPS Take 0.4 mg by mouth daily.     Yes Historical Provider, MD  oxyCODONE-acetaminophen (ROXICET) 5-325 MG per tablet Take 1 tablet by mouth every 4 (four) hours as needed for pain. Patient not taking: Reported on 06/02/2014 03/15/12   Ulyses Amor, PA-C    Physical Exam: Filed Vitals:   06/02/14 2230 06/02/14 2259 06/02/14 2300 06/02/14 2330  BP: 123/68  133/69 113/60  Pulse: 88 83 83 80  Temp:      TempSrc:      Resp: 17 14 22 20   Height:      Weight:      SpO2: 93% 94% 94% 93%    Physical Exam  Constitutional: Appears well-developed and well-nourished for age. No distress.  HENT: Normocephalic. Oropharynx is clear and moist.  Eyes: Conjunctivae are normal no scleral icterus.  Neck: Normal ROM. Neck supple. No carotid bruit, well healed scar at right neck CVS: RR, NR, S1/S2 +, no murmurs, no gallops, no carotid bruit.  Pulmonary: Effort and breath sounds normal, rhonchi, wheezes, rales.  Abdominal: Soft. BS +,  no distension,  tenderness, rebound  Musculoskeletal:No edema and no tenderness.  No erythema Neuro: Alert. Grossly normal.  Skin: Skin is warm and dry. No rash noted. Not diaphoretic. No erythema. No pallor.  Psychiatric: Normal mood and affect. Behavior, judgment, thought content normal.   Labs on Admission:  Basic Metabolic Panel:  Recent Labs Lab 06/02/14 2150 06/02/14 2313  NA 136  --   K 5.8* 4.0  CL 104  --   CO2 25  --   GLUCOSE 159*  --   BUN 22  --   CREATININE 1.57*  --   CALCIUM 8.7  --    Liver Function Tests:  Recent Labs Lab 06/02/14 2150  AST 54*  ALT 25  ALKPHOS 60  BILITOT 2.0*  PROT 6.4  ALBUMIN 3.7   CBC:  Recent Labs Lab 06/02/14 2150  WBC 12.6*  HGB 16.6  HCT 48.4  MCV 95.7  PLT 151    EKG: Tachycardic, RBBB (old)   If 7PM-7AM, please contact night-coverage [email or web address] Password [email or web address] 06/03/2014, 1:08 AM

## 2014-06-03 NOTE — Progress Notes (Signed)
ANTICOAGULATION CONSULT NOTE - Follow Up Consult  Pharmacy Consult for heparin Indication: new pulmonary embolus  No Known Allergies  Patient Measurements: Height: 6\' 2"  (188 cm) Weight: 232 lb 12.9 oz (105.6 kg) IBW/kg (Calculated) : 82.2 Heparin Dosing Weight: 103 kg  Vital Signs: Temp: 98 F (36.7 C) (02/21 0538) Temp Source: Oral (02/21 0538) BP: 126/64 mmHg (02/21 1018) Pulse Rate: 80 (02/21 1018)  Labs:  Recent Labs  06/02/14 2150 06/03/14 0359 06/03/14 1130  HGB 16.6 15.5  --   HCT 48.4 45.0  --   PLT 151 155  --   HEPARINUNFRC  --   --  0.52  CREATININE 1.57* 1.37*  --   TROPONINI  --  0.03  --     Estimated Creatinine Clearance: 53.9 mL/min (by C-G formula based on Cr of 1.37).   Assessment: Patient is an 81 y.o M with history of PE who was admitted on 2/20 with c/o SOB.  Chest CT angio showed new bilateral PEs with right heart strain.  He's currently on heparin for VTE treatment with plan to consult pulmonary for recommendations regarding management of PE.  First heparin level is therapeutic at 0.52.  No bleeding documented.   Goal of Therapy:  Heparin level 0.3-0.7 units/ml Monitor platelets by anticoagulation protocol: Yes   Plan:  - continue heparin drip at 1600 units/hr - will re-check another heparin level at 8PM tonight to ensure level is still therapeutic before changing to daily heparin level monitoring  Jimi Giza P 06/03/2014,12:13 PM

## 2014-06-04 DIAGNOSIS — I2699 Other pulmonary embolism without acute cor pulmonale: Secondary | ICD-10-CM | POA: Diagnosis not present

## 2014-06-04 LAB — CBC
HCT: 44.1 % (ref 39.0–52.0)
Hemoglobin: 14.6 g/dL (ref 13.0–17.0)
MCH: 32 pg (ref 26.0–34.0)
MCHC: 33.1 g/dL (ref 30.0–36.0)
MCV: 96.7 fL (ref 78.0–100.0)
Platelets: 140 10*3/uL — ABNORMAL LOW (ref 150–400)
RBC: 4.56 MIL/uL (ref 4.22–5.81)
RDW: 12.7 % (ref 11.5–15.5)
WBC: 8.5 10*3/uL (ref 4.0–10.5)

## 2014-06-04 LAB — BASIC METABOLIC PANEL
Anion gap: 6 (ref 5–15)
BUN: 18 mg/dL (ref 6–23)
CO2: 25 mmol/L (ref 19–32)
Calcium: 8.3 mg/dL — ABNORMAL LOW (ref 8.4–10.5)
Chloride: 108 mmol/L (ref 96–112)
Creatinine, Ser: 1.37 mg/dL — ABNORMAL HIGH (ref 0.50–1.35)
GFR, EST AFRICAN AMERICAN: 54 mL/min — AB (ref >90)
GFR, EST NON AFRICAN AMERICAN: 46 mL/min — AB (ref >90)
Glucose, Bld: 105 mg/dL — ABNORMAL HIGH (ref 70–99)
Potassium: 3.6 mmol/L (ref 3.5–5.1)
Sodium: 139 mmol/L (ref 135–145)

## 2014-06-04 LAB — HEPARIN LEVEL (UNFRACTIONATED): Heparin Unfractionated: 0.81 IU/mL — ABNORMAL HIGH (ref 0.30–0.70)

## 2014-06-04 MED ORDER — RIVAROXABAN 20 MG PO TABS: 20.0000 mg | ORAL_TABLET | Freq: Every day | ORAL | Status: DC

## 2014-06-04 MED ORDER — RIVAROXABAN (XARELTO) VTE STARTER PACK (15 & 20 MG)
ORAL_TABLET | ORAL | Status: DC
Start: 2014-06-04 — End: 2015-03-25

## 2014-06-04 MED ORDER — OXYCODONE HCL 5 MG PO TABS: 5.0000 mg | ORAL_TABLET | Freq: Four times a day (QID) | ORAL | Status: DC | PRN

## 2014-06-04 MED ORDER — RIVAROXABAN 15 MG PO TABS
15.0000 mg | ORAL_TABLET | Freq: Two times a day (BID) | ORAL | Status: DC
  Administered 2014-06-04 (×2): 15 mg via ORAL
  Filled 2014-06-04 (×3): qty 1

## 2014-06-04 NOTE — Progress Notes (Signed)
*  Preliminary Results* Bilateral lower extremity venous duplex completed. The right lower extremity is negative for deep vein thrombosis. The left lower extremity is positive for acute deep vein thrombosis involving the left femoral vein. There is no evidence of Baker's cyst bilaterally.  Preliminary results discussed with Dr. Waldron Labs.  06/04/2014  Maudry Mayhew, RVT, RDCS, RDMS

## 2014-06-04 NOTE — Care Management Note (Signed)
    Page 1 of 1   06/04/2014     2:48:26 PM CARE MANAGEMENT NOTE 06/04/2014  Patient:  Leonard Berg, Leonard Berg   Account Number:  [id number]  Date Initiated:  06/04/2014  Documentation initiated by:  Marvetta Gibbons  Subjective/Objective Assessment:   Pt admitted with PE     Action/Plan:   PTA pt lived at home with wife   Anticipated DC Date:  06/05/2014   Anticipated DC Plan:  [place]  CM consult  Medication Assistance      Choice offered to / List presented to:             Status of service:  In process, will continue to follow Medicare Important Message given?   (If response is "NO", the following Medicare IM given date fields will be blank) Date Medicare IM given:   Medicare IM given by:   Date Additional Medicare IM given:   Additional Medicare IM given by:    Discharge Disposition:  HOME/SELF CARE  Per UR Regulation:  Reviewed for med. necessity/level of care/duration of stay  If discussed at [place] of Stay Meetings, dates discussed:    Comments:  06/04/14- 1430- Marvetta Gibbons RN, BSN [phone] Pt to go home on Xarelto- benefits check completed 06/04/2014 Called [employer] at [phone]. Talked to [place]. XARELTO is covered. No Prior Authorization required. Both 15mg  dosage and 20mg  dosage have a $40.00 co-payment at [employer]. Available at many pharmacies. Spoke with pt and wife at bedside-  above info shared and free 30 day card given to pt for Xarelto.

## 2014-06-04 NOTE — Discharge Summary (Signed)
Leonard Skoog Corrales Sr., 81 y.o., DOB 08/14/1929, MRN [id number]. Admission date: 06/02/2014 Discharge Date 06/04/2014 Primary MD Morton Peters, MD Admitting Physician Sid Falcon, MD   PCP please follow-up on: - Check CBC, BMP as patient was started on Xarelto on discharge.  Admission Diagnosis  Pulmonary embolism [I26.99] SOB (shortness of breath) [R06.02]  Discharge Diagnosis   Principal Problem:   PE (pulmonary embolism) Active Problems:   Hypothyroidism   Mixed hyperlipidemia   Essential hypertension   Coronary atherosclerosis of native coronary artery   Osteoarthritis   BPH (benign prostatic hyperplasia)   Pulmonary embolism   Past Medical History  Diagnosis Date  . Hypertension   . Hyperlipidemia   . Pulmonary embolism 2009    hx of  . DJD (degenerative joint disease)   . Arrhythmia     PAROXYSMAL ATRIAL FIBRILLATION  . RBBB (right bundle branch block)   . Unstable angina   . Arthritis     OSTEOARTHRITIS  . Anemia   . Thrombocytopenia   . History of BPH   . Thyroid disease     HYPOTHYROIDISM  . DVT (deep venous thrombosis)   . Coronary artery disease 8/11    CABG...LM EMERGENT WITH IABP sees Dr. Legrand Como cooper  . Chronic back pain   . Cataracts, bilateral   . Renal artery stenosis   . Hypothyroidism     Past Surgical History  Procedure Laterality Date  . Replacement total knee  2006    RIGHT  . Coronary artery bypass graft  11-21-09    EMERGENT X 3 GRAFTING. ..[name], MD, cc: [name]  . Mandible surgery    . Foot surgery    . Renal artery stent  2010  . Total hip arthroplasty      right  . Endarterectomy  01/28/2012    Procedure: ENDARTERECTOMY CAROTID;  Surgeon: [name], MD;  Location: [facility];  Service: Vascular;  Laterality: Right;  with Primary Closure of Artery  . Endarterectomy  03/15/2012    Procedure: ENDARTERECTOMY CAROTID;  Surgeon: [name], MD;  Location: [facility];  Service:  Vascular;  Laterality: Left;  . Carotid endarterectomy       Hospital Course See H&P, Labs, Consult and Test reports for all details in brief, patient was admitted for **  Principal Problem:   PE (pulmonary embolism) Active Problems:   Hypothyroidism   Mixed hyperlipidemia   Essential hypertension   Coronary atherosclerosis of native coronary artery   Osteoarthritis   BPH (benign prostatic hyperplasia)   Pulmonary embolism  [name] is an 81yo man with PMH of CAD s/p CABG, DVT and PE in 2009, cataract (recent surgery 2 weeks ago), HTN, HLD, hypothyroidism, RAS, BPH who presents with worsening SOB over the last few days. He noted that he had surgery 2 weeks ago and has been pretty sedentary in that time, but yesterday he was out in the barn and when he came back in he was so short of breath and developed right sided chest pain with radiation down his arm and tingling in his hands. He told his wife he thought he should be "checked out" and they presented to the ED. He has a history of a PE in 2009 and he was treated for a time with coumadin and then this was stopped. He denies any nausea, vomiting, recent URI, swelling in the legs, erythema in the legs. He has chronic pain in the legs due to osteoarthritis and  some chronic bilateral swelling. He further has chronic constipation.  In the ED, a d dimer was elevated and a CT of the chest revealed bilateral PE. He was started on a heparin drip, bilateral venous Doppler revealed left lower extremity DVT in the femoral vein, patient had 2-D echo which did not show any right ventricular strain, with EF of 55-60% with grade 1 diastolic dysfunction, patient did not have any hypoxia, tachypnea, or tachycardia, was transitioned to oral Xarelto.  PE (pulmonary embolism) - Transitioned from heparin drip to Xarelto. - LE dopplers showing evidence of acute DVT in left femoral vein - Right heart strain on CT scan, 2-D echo showing no evidence of  right heart strain with normal ejection fraction 55-60% with a grade 1 diastolic dysfunction - No hypoxia, no tachycardia, no tachypnea, and discharge  Coronary atherosclerosis of native coronary artery s/p CABG with chest pain (also with grade 1 diastolic heart failure) - patient with chest pain, but in the setting of PE - CE negative 2 - Continue aspirin, statin, BP medications   Hyperkalemia - Repleted  CKD - Unclear baseline, last labs in our system in 2012, recheck in the morning - Improved, creatinine is 1.37 today  Hypothyroidism - TSH 2.395 - Continue synthroid   Mixed hyperlipidemia - Continue statin, last LDL checked in our system was 96   Essential hypertension - He is on metoprolol, lasix, amlodipine, continue these, monitor on telemetry   Osteoarthritis    BPH (benign prostatic hyperplasia) - Continue tamsulosin  Consults   Pulmonary Family communication: Wife was at bedside  Significant Tests:  See full reports for all details    Dg Chest 2 View  06/02/2014   CLINICAL DATA:  SOB. Left side chest pain. Hx bypass surgery, HTN, PE, and CAD.  EXAM: CHEST  2 VIEW  COMPARISON:  01/27/2012  FINDINGS: Lungs are mildly hyperexpanded. There is some minor reticular scarring at the lung bases, stable. No lung consolidation or edema. No pleural effusion or pneumothorax.  Changes from CABG surgery are stable. Cardiac silhouette is normal in size and configuration. No mediastinal or hilar masses or evidence of adenopathy.  Bony thorax is demineralized but grossly intact.  IMPRESSION: No acute cardiopulmonary disease.   Electronically Signed   By: Lajean Manes M.D.   On: 06/02/2014 22:55   Ct Angio Chest Pe W/cm &/or Wo Cm  06/03/2014   CLINICAL DATA:  Presents with sudden onset of SOB and weakness begna this evening. Unable to walk short distance without feeling winded and tired-abnormal for patient. Pt has elevated HR-denies pain. Breath sounds diminished, sats  91% RA, no pedal or lower extremity noted. HX of PEs  EXAM: CT ANGIOGRAPHY CHEST WITH CONTRAST  TECHNIQUE: Multidetector CT imaging of the chest was performed using the standard protocol during bolus administration of intravenous contrast. Multiplanar CT image reconstructions and MIPs were obtained to evaluate the vascular anatomy.  CONTRAST:  41mL OMNIPAQUE IOHEXOL 350 MG/ML SOLN  COMPARISON:  11/17/2009  FINDINGS: Angiographic study: There are bilateral pulmonary emboli. Pulmonary embolus extends from the peripheral right pulmonary artery into the right upper car right middle and lower lobe segmental branches. There also left pulmonary artery emboli lying at the origin of the left upper lobe pulmonary embolus and extending into the left lower lobe segmental branches. There is a greater burden of pulmonary emboli on the right than on the left.  Right ventricular to left ventricular ratio is 1.2 suggesting right heart strain.  Mediastinum: Heart is  normal in size. Changes from CABG surgery. There are moderate coronary artery calcifications. Aorta is normal in caliber. Atherosclerotic calcification is noted along the thoracic aorta. No mediastinal or hilar masses. There are few prominent mediastinal lymph nodes, largest is a left paratracheal node just above the carina measuring 1 cm in short axis. No hilar masses or enlarged lymph nodes.  Lungs and pleura: Linear and reticular opacities in the lower lobes and base of the right middle lobe and lingula of the left upper lobe consistent with subsegmental atelectasis. No lung consolidation or edema. No pleural effusion. No mass or suspicious nodule. Calcified granuloma noted in the lateral right lower lobe.  Limited upper abdomen: Mild bilateral renal cortical thinning. No acute findings.  Musculoskeletal: Bony thorax is demineralized. There are degenerative changes along the visualized spine. No osteoblastic or osteolytic lesions.  Review of the MIP images confirms the  above findings.  IMPRESSION: 1. Bilateral pulmonary emboli as described. No evidence of pulmonary infarction. Lungs show subsegmental atelectasis. There is no pleural effusion. 2. Right ventricular to left ventricular ratios 1.2 consistent with right heart strain. Positive for acute PE with CT evidence of right heartstrain (RV/LV Ratio = 1.2) consistent with at least submassive (intermediate risk)PE. The presence of right heart strain has been associated with anincreased risk of morbidity and mortality. Consultation with [facility] is recommended. Critical Value/emergent results were called by telephone at the time of interpretation on 06/03/2014 at 12:13 am to [facility], who verbally acknowledged these results.   Electronically Signed   By: Lajean Manes M.D.   On: 06/03/2014 00:14     Today   Subjective:   [name] today has no headache,no chest abdominal pain,no new weakness tingling or numbness, feels much better wants to go home today.   Objective:   Blood pressure 126/69, pulse 90, temperature 97.9 F (36.6 C), temperature source Oral, resp. rate 18, height 6\' 2"  (1.88 m), weight 105.6 kg (232 lb 12.9 oz), SpO2 92 %.  Intake/Output Summary (Last 24 hours) at 06/04/14 1652 Last data filed at 06/04/14 1647  Gross per 24 hour  Intake 809.75 ml  Output   3025 ml  Net -2215.25 ml    Exam Awake Alert, Oriented *3, No new F.N deficits, Normal affect [place].AT,PERRAL Supple Neck,No JVD, No cervical lymphadenopathy appriciated.  Symmetrical Chest wall movement, Good air movement bilaterally, CTAB RRR,No Gallops,Rubs or new Murmurs, No Parasternal Heave +ve B.Sounds, Abd Soft, Non tender, No organomegaly appriciated, No rebound -guarding or rigidity. No Cyanosis, Clubbing or edema, No new Rash or bruise  Data Review   Cultures -   CBC w Diff: Lab Results  Component Value Date   WBC 8.5 06/04/2014   HGB 14.6 06/04/2014   HCT 44.1 06/04/2014   PLT 140*  06/04/2014   LYMPHOPCT 30.6 08/12/2010   MONOPCT 11.2 08/12/2010   EOSPCT 1.6 08/12/2010   BASOPCT 0.5 08/12/2010   CMP: Lab Results  Component Value Date   NA 139 06/04/2014   K 3.6 06/04/2014   CL 108 06/04/2014   CO2 25 06/04/2014   BUN 18 06/04/2014   CREATININE 1.37* 06/04/2014   PROT 6.4 06/02/2014   ALBUMIN 3.7 06/02/2014   BILITOT 2.0* 06/02/2014   ALKPHOS 60 06/02/2014   AST 54* 06/02/2014   ALT 25 06/02/2014  .  Micro Results No results found for this or any previous visit (from the past 240 hour(s)).   Discharge Instructions      Follow-up Information  Follow up with [facility].   Why:  HH-PT arranged   Contact information:   [place] [address] [phone]       Follow up with Morton Peters, MD. Schedule an appointment as soon as possible for a visit in 1 week.   Specialty:  Family Medicine   Why:  Posthospitalization follow-up   Contact information:   [place] [address] [phone]       Discharge Medications     Medication List    STOP taking these medications        oxyCODONE-acetaminophen 5-325 MG per tablet  Commonly known as:  ROXICET      TAKE these medications        aspirin 81 MG tablet  Take 81 mg by mouth daily.     CENTRUM SILVER PO  Take 1 tablet by mouth daily.     fluticasone 50 MCG/ACT nasal spray  Commonly known as:  FLONASE  Place 2 sprays into the nose as needed.     furosemide 40 MG tablet  Commonly known as:  LASIX  Take 40 mg by mouth daily.     levothyroxine 125 MCG tablet  Commonly known as:  SYNTHROID, LEVOTHROID  Take 125 mcg by mouth daily.     metoprolol succinate 25 MG 24 hr tablet  Commonly known as:  TOPROL XL  Take 1 tablet (25 mg total) by mouth daily.     oxyCODONE 5 MG immediate release tablet  Commonly known as:  Oxy IR/ROXICODONE  Take 1 tablet (5 mg total) by mouth every 6 (six) hours as needed for severe pain.      Rivaroxaban 15 & 20 MG Tbpk  Commonly known as:  XARELTO STARTER PACK  Take as directed on package: Start with one 15mg  tablet by mouth twice a day with food. On Day 22, switch to one 20mg  tablet once a day with food.     rosuvastatin 10 MG tablet  Commonly known as:  CRESTOR  Take 10 mg by mouth at bedtime.     tamsulosin 0.4 MG Caps capsule  Commonly known as:  FLOMAX  Take 0.4 mg by mouth daily.     ZANTAC 75 PO  Take 1 tablet by mouth daily as needed. For heart burn         Total Time in preparing paper work, data evaluation and todays exam - 35 minutes  Jabori Henegar M.D on 06/04/2014 at 4:52 PM  [place]  [phone]

## 2014-06-04 NOTE — Progress Notes (Signed)
CARE MANAGEMENT NOTE 06/04/2014  Patient:  Leonard Berg, Leonard Berg   Account Number:  [id number]  Date Initiated:  06/04/2014  Documentation initiated by:  Marvetta Gibbons  Subjective/Objective Assessment:   Pt admitted with PE     Action/Plan:   PTA pt lived at home with wife   Anticipated DC Date:  06/05/2014   Anticipated DC Plan:  [place]  CM consult  Medication Assistance      [facility] Choice  HOME HEALTH   Choice offered to / List presented to:  C-3 Spouse        HH arranged  HH-2 PT      [place]   Status of service:  Completed, signed off Medicare Important Message given?  NA - LOS <3 / Initial given by admissions (If response is "NO", the following Medicare IM given date fields will be blank) Date Medicare IM given:   Medicare IM given by:   Date Additional Medicare IM given:   Additional Medicare IM given by:    Discharge Disposition:  [place]  Per UR Regulation:  Reviewed for med. necessity/level of care/duration of stay  If discussed at [place] of Stay Meetings, dates discussed:    Comments:  06/04/14- 1430- Marvetta Gibbons RN, BSN [phone] Pt to go home on Xarelto- benefits check completed 06/04/2014 Called [employer] at [phone]. Talked to [place]. XARELTO is covered. No Prior Authorization required. Both 15mg  dosage and 20mg  dosage have a $40.00 co-payment at [employer] Available at many pharmacies. Spoke with pt and wife at bedside-  above info shared and free 30 day card given to pt for Xarelto.  update- 1600- Valentina Gu, BSN - per MD pt to go home today with HH-PT needs- returned to pt and wife to offer choice for [facility] agencies- per conversation pt has used [place] in past- would like to use them again- referral called to [facility] with [facility]- for HH-PT services- referral accepted.

## 2014-06-04 NOTE — Progress Notes (Addendum)
ANTICOAGULATION CONSULT NOTE - Follow Up Consult  Pharmacy Consult for Heparin Indication: pulmonary embolus  No Known Allergies  Patient Measurements: Height: 6\' 2"  (188 cm) Weight: 232 lb 12.9 oz (105.6 kg) IBW/kg (Calculated) : 82.2  Vital Signs: Temp: 97.9 F (36.6 C) (02/22 0451) Temp Source: Oral (02/22 0451) BP: 141/72 mmHg (02/22 0451) Pulse Rate: 75 (02/22 0451)  Labs:  Recent Labs  06/02/14 2150 06/03/14 0359 06/03/14 1130 06/03/14 1750 06/03/14 2003 06/04/14 0500  HGB 16.6 15.5  --   --   --  14.6  HCT 48.4 45.0  --   --   --  44.1  PLT 151 155  --   --   --  140*  HEPARINUNFRC  --   --  0.52  --  0.75* 0.81*  CREATININE 1.57* 1.37*  --   --   --  1.37*  TROPONINI  --  0.03 0.03 0.03  --   --     Estimated Creatinine Clearance: 53.9 mL/min (by C-G formula based on Cr of 1.37).   Assessment: 80 y/o M with bilateral PE per CT Angio, heparin level is elevated despite rate decrease. No issues per RN.   Goal of Therapy:  Heparin level 0.3-0.7 units/ml Monitor platelets by anticoagulation protocol: Yes   Plan:  -Decrease heparin drip to 1250 units/hr -1430 HL -Daily CBC/HL -Monitor for bleeding  Narda Bonds 06/04/2014,6:04 AM  Addendum: Pharmacy has been asked to change heparin to xarelto. Will dose xarelto 15mg  bid x 21 days then 20mg  daily. Will follow up with xarelto education.  Nena Jordan 06/04/2014, 9:28 AM

## 2014-06-04 NOTE — Evaluation (Signed)
Physical Therapy Evaluation Patient Details Name: Leonard Berg [facility] Sr. MRN: [id number] DOB: 1929/07/31 Today's Date: 06/04/2014   History of Present Illness  35 yowm remote smoker with multiple orthopedic procedures both LE and h/o PE in 2009 on coumadin per Dr Hardin Negus in [place] transiently but fine off it until acute cp/sob 2/20 > to ER where dx with multiple PE with RV strain  Clinical Impression  Pt admitted with above the diagnosis. Pt currently with functional limitations due to the deficits listed below (see PT Problem List). Ambulates up to 110 feet today with a rolling walker for support, 3/4 dyspnea, SpO2 92% on room air, HR 126. Wife states his mobility is near baseline with hx of multiple LE surgeries. Would benefit from HHPT follow up at d/c. Pt will benefit from skilled PT to increase their independence and safety with mobility to allow discharge to the venue listed below.       Follow Up Recommendations Home health PT    Equipment Recommendations  None recommended by PT    Recommendations for Other Services       Precautions / Restrictions Precautions Precautions: Fall Precaution Comments: monitor O2 Restrictions Weight Bearing Restrictions: No      Mobility  Bed Mobility Overal bed mobility: Modified Independent                Transfers Overall transfer level: Needs assistance Equipment used: Rolling walker (2 wheeled) Transfers: Sit to/from Stand Sit to Stand: Supervision         General transfer comment: Supervision for safety. VC for hand placement. No physical assist needed from lowest bed setting.  Ambulation/Gait Ambulation/Gait assistance: Supervision Ambulation Distance (Feet): 110 Feet Assistive device: Rolling walker (2 wheeled) Gait Pattern/deviations: Step-through pattern;Decreased stride length;Scissoring;Narrow base of support;Trunk flexed Gait velocity: decreased   General Gait Details: Educated on safe DME use with  a rolling walker. VC for upright posture and to widen base of support. No loss of balance noted. Intermittent scissoring of gait. HR to 126  Stairs            Wheelchair Mobility    Modified Rankin (Stroke Patients Only)       Balance Overall balance assessment: Needs assistance;History of Falls Sitting-balance support: No upper extremity supported;Feet supported Sitting balance-Leahy Scale: Normal     Standing balance support: No upper extremity supported Standing balance-Leahy Scale: Fair                               Pertinent Vitals/Pain Pain Assessment: 0-10 Pain Score: 2  Pain Location: chest Pain Descriptors / Indicators: Sharp (worse with coughing) Pain Intervention(s): Limited activity within patient's tolerance;Monitored during session;Repositioned    Home Living Family/patient expects to be discharged to:: Private residence Living Arrangements: Spouse/significant other Available Help at Discharge: Family;Available 24 hours/day Type of Home: House Home Access: Stairs to enter Entrance Stairs-Rails: Psychiatric nurse of Steps: 5 Home Layout: Two level;Able to live on main level with bedroom/bathroom Home Equipment: Kasandra Knudsen - single point;Walker - 2 wheels;Bedside commode;Shower seat - built in      Prior Function Level of Independence: Needs assistance   Gait / Transfers Assistance Needed: independent without a device  ADL's / Homemaking Assistance Needed: Needs assist with bath/dress        Hand Dominance   Dominant Hand: Right    Extremity/Trunk Assessment   Upper Extremity Assessment: Defer to OT evaluation  Lower Extremity Assessment: Generalized weakness (Hx of multiple LE surgeries, RLE shorter than LLE)         Communication   Communication: HOH  Cognition Arousal/Alertness: Awake/alert Behavior During Therapy: WFL for tasks assessed/performed Overall Cognitive Status: Within Functional  Limits for tasks assessed                      General Comments General comments (skin integrity, edema, etc.): At rest, SpO2 94% on room air, HR 90. Ambulating SpO2 92% on room air, HR 126. 3/4 dyspnea.    Exercises General Exercises - Lower Extremity Ankle Circles/Pumps: AROM;Both;10 reps;Supine Short Arc Quad: Strengthening;Both;AROM;5 reps;Supine      Assessment/Plan    PT Assessment Patient needs continued PT services  PT Diagnosis Difficulty walking;Abnormality of gait;Generalized weakness;Acute pain   PT Problem List Decreased strength;Decreased range of motion;Decreased activity tolerance;Decreased balance;Decreased mobility;Decreased coordination;Decreased knowledge of use of DME;Cardiopulmonary status limiting activity;Pain  PT Treatment Interventions DME instruction;Gait training;Stair training;Functional mobility training;Therapeutic activities;Therapeutic exercise;Balance training;Neuromuscular re-education;Patient/family education   PT Goals (Current goals can be found in the Care Plan section) Acute Rehab PT Goals Patient Stated Goal: Go back to the [facility] to exercises in the pool PT Goal Formulation: With patient Time For Goal Achievement: 06/18/14 Potential to Achieve Goals: Good    Frequency Min 3X/week   Barriers to discharge        Co-evaluation               End of Session Equipment Utilized During Treatment: Gait belt Activity Tolerance: Patient tolerated treatment well Patient left: in bed;with call bell/phone within reach;with family/visitor present Nurse Communication: Mobility status         Time: 5110-2111 PT Time Calculation (min) (ACUTE ONLY): 22 min   Charges:   PT Evaluation $Initial PT Evaluation Tier I: 1 Procedure     PT G CodesEllouise Newer 06/04/2014, 3:31 PM Camille Bal Rent[place], [place]

## 2014-06-04 NOTE — Progress Notes (Signed)
PULMONARY / CRITICAL CARE MEDICINE   Name: Leonard Berg [facility] MRN: [id number] DOB: 1929-09-12    ADMISSION DATE:  06/02/2014 CONSULTATION DATE:  06/03/14  REFERRING MD : Dr Elgergawy/ [facility]  CHIEF COMPLAINT:  Cp/sob  INITIAL PRESENTATION:  74 yowm remote smoker with multiple orthopedic procedures both LE and h/o PE in  2009 on coumadin per Dr Hardin Negus in [place] transiently but fine off it until acute cp/sob 2/20 > to ER where dx with multiple PE with RV strain and pccm service asked to consult formally am 2/22   STUDIES:  CTa 06/02/14> bilateral pe with rv strain Echo 2/22 >>> Venous dopplers 2/22 >>>  SIGNIFICANT EVENTS: Started on IV hep 2/21 12:21AM    SUBJECTIVE:  Pleasant wm able to lie back at 30 degrees on RA  VITAL SIGNS: Temp:  [97.9 F (36.6 C)-98.6 F (37 C)] 97.9 F (36.6 C) (02/22 0451) Pulse Rate:  [75-83] 75 (02/22 0451) Resp:  [18] 18 (02/22 0451) BP: (113-141)/(60-72) 141/72 mmHg (02/22 0451) SpO2:  [93 %-94 %] 93 % (02/22 0451) HEMODYNAMICS:   VENTILATOR SETTINGS:   INTAKE / OUTPUT:  Intake/Output Summary (Last 24 hours) at 06/04/14 1421 Last data filed at 06/04/14 1418  Gross per 24 hour  Intake 841.75 ml  Output   2725 ml  Net -1883.25 ml    PHYSICAL EXAMINATION: General: pleasant wm nad  Pt alert, approp nad @ 30 degrees HOB No jvd Oropharynx clear Neck supple Lungs clear bilaterally to A and P  RRR no s3 or or sign murmur or audible increase in P2  Abd soft and benign  Extr wam with slt increase L > R leg circumference, no calf tenderness, neg homan's no pitting  Neuro  No motor deficits   LABS:  CBC  Recent Labs Lab 06/02/14 2150 06/03/14 0359 06/04/14 0500  WBC 12.6* 9.5 8.5  HGB 16.6 15.5 14.6  HCT 48.4 45.0 44.1  PLT 151 155 140*   Coag's No results for input(s): APTT, INR in the last 168 hours. BMET  Recent Labs Lab 06/02/14 2150 06/02/14 2313 06/03/14 0359 06/04/14 0500  NA 136  --  139 139   K 5.8* 4.0 4.0 3.6  CL 104  --  108 108  CO2 25  --  23 25  BUN 22  --  19 18  CREATININE 1.57*  --  1.37* 1.37*  GLUCOSE 159*  --  105* 105*   Electrolytes  Recent Labs Lab 06/02/14 2150 06/03/14 0359 06/04/14 0500  CALCIUM 8.7 8.6 8.3*   Sepsis Markers No results for input(s): LATICACIDVEN, PROCALCITON, O2SATVEN in the last 168 hours. ABG No results for input(s): PHART, PCO2ART, PO2ART in the last 168 hours. Liver Enzymes  Recent Labs Lab 06/02/14 2150  AST 54*  ALT 25  ALKPHOS 60  BILITOT 2.0*  ALBUMIN 3.7   Cardiac Enzymes  Recent Labs Lab 06/03/14 0359 06/03/14 1130 06/03/14 1750  TROPONINI 0.03 0.03 0.03   Glucose No results for input(s): GLUCAP in the last 168 hours.  Imaging No results found.   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

## 2014-06-04 NOTE — Progress Notes (Signed)
  Echocardiogram 2D Echocardiogram has been performed.  Bobbye Charleston 06/04/2014, 1:36 PM

## 2014-06-04 NOTE — Progress Notes (Signed)
DC IV and tele per MD orders and protocol; DC instructions reviewed with patient and wife at bedside; no further questions from pt or wife; paper prescriptions given to wife along with DC instructions; advised patient to make a follow up with primary in one week.  Rowe Pavy, RN

## 2014-06-04 NOTE — Discharge Instructions (Signed)
Information on my medicine - XARELTO (rivaroxaban)  This medication education was reviewed with me or my healthcare representative as part of my discharge preparation.  The pharmacist that spoke with me during my hospital stay was:  Deboraha Sprang, [place]? Xarelto was prescribed to treat blood clots that may have been found in the veins of your legs (deep vein thrombosis) or in your lungs (pulmonary embolism) and to reduce the risk of them occurring again.  What do you need to know about Xarelto? The starting dose is one 15 mg tablet taken TWICE daily with food for the FIRST 21 DAYS then on 3/14 PM the dose is changed to one 20 mg tablet taken ONCE A DAY with your evening meal.  DO NOT stop taking Xarelto without talking to the health care provider who prescribed the medication.  Refill your prescription for 20 mg tablets before you run out.  After discharge, you should have regular check-up appointments with your healthcare provider that is prescribing your Xarelto.  In the future your dose may need to be changed if your kidney function changes by a significant amount.  What do you do if you miss a dose? If you are taking Xarelto TWICE DAILY and you miss a dose, take it as soon as you remember. You may take two 15 mg tablets (total 30 mg) at the same time then resume your regularly scheduled 15 mg twice daily the next day.  If you are taking Xarelto ONCE DAILY and you miss a dose, take it as soon as you remember on the same day then continue your regularly scheduled once daily regimen the next day. Do not take two doses of Xarelto at the same time.   Important Safety Information Xarelto is a blood thinner medicine that can cause bleeding. You should call your healthcare provider right away if you experience any of the following: ? Bleeding from an injury or your nose that does not stop. ? Unusual colored urine (red or dark brown) or unusual  colored stools (red or black). ? Unusual bruising for unknown reasons. ? A serious fall or if you hit your head (even if there is no bleeding).  Some medicines may interact with Xarelto and might increase your risk of bleeding while on Xarelto. To help avoid this, consult your healthcare provider or pharmacist prior to using any new prescription or non-prescription medications, including herbals, vitamins, non-steroidal anti-inflammatory drugs (NSAIDs) and supplements.  This website has more information on Xarelto: [email or web address]

## 2014-06-07 ENCOUNTER — Ambulatory Visit: Payer: Self-pay | Admitting: Ophthalmology

## 2014-06-14 ENCOUNTER — Ambulatory Visit: Payer: Self-pay | Admitting: Ophthalmology

## 2014-07-12 ENCOUNTER — Telehealth: Payer: Self-pay | Admitting: Cardiovascular Disease

## 2014-07-12 NOTE — Telephone Encounter (Signed)
Spoke w/wife.  (pt is HOH and authorized me to talk with her).  Wife states he needs to have back injection but is on Xarelto that was started 2/22.  States she called Dr. Juanda Crumble Phillips's office and was told they would not be responsible for directing Xarelto and to call Cardiologist. Spoke w/Sally Zettie Pho, pharmacist who states he can not stop Xarelto for 3 mo following PE and DVT. Wife understands and will call Dr. Donell Sievert office to make them aware.

## 2014-07-12 NOTE — Telephone Encounter (Signed)
New Message  Pt wife calling about injections pt plans on getting at Dr. Lovenia Shuck- spine center. Pt wanted to discuss  Pt's Xarelto prescription started- 2/22; will need to be off for shot. Please call back and discuss.

## 2014-08-12 NOTE — Op Note (Signed)
PATIENT NAME:  Leonard Berg, Leonard Berg MR#:  [id number] DATE OF BIRTH:  1930/01/02  DATE OF PROCEDURE:  05/17/2014  PREOPERATIVE DIAGNOSIS:  Nuclear sclerotic cataract left eye.  ICD-10 H25.12  POSTOPERATIVE DIAGNOSIS:  Nuclear sclerotic cataract left eye.  PROCEDURE:  Phacoemulsification with posterior chamber intraocular lens implantation of the left eye.   LENS:  ZCB00 19.5-diopter posterior chamber intraocular lens.  ULTRASOUND TIME:  19% of 1 minute, 21 seconds.  CDE 15.4.  SURGEON:  Mali Jocsan Mcginley, MD  ANESTHESIA:  Topical with tetracaine drops and 2% Xylocaine jelly.  COMPLICATIONS:  None.  DESCRIPTION OF PROCEDURE:  The patient was identified in the holding room and transported to the operating room and placed in the supine position under the operating microscope.  The left eye was identified as the operative eye and it was prepped and draped in the usual sterile ophthalmic fashion.  A 1 millimeter clear-corneal paracentesis was made at the 1:30 position.  The anterior chamber was filled with Viscoat viscoelastic.  A 2.4 millimeter keratome was used to make a near-clear corneal incision at the 10:30 position.  A curvilinear capsulorrhexis was made with a cystotome and capsulorrhexis forceps.  Balanced salt solution was used to hydrodissect and hydrodelineate the nucleus.  Phacoemulsification was then used in stop and chop fashion to remove the lens nucleus and epinucleus.  The remaining cortex was then removed using the irrigation and aspiration handpiece. Provisc was then placed into the capsular bag to distend it for lens placement.  A ZCB00 19.5-diopter lens was then injected into the capsular bag.  The remaining viscoelastic was aspirated.  Wounds were hydrated with balanced salt solution.  The anterior chamber was inflated to a physiologic pressure with balanced salt solution.  0.1 mL of cefuroxime 10 mg/mL were injected into the anterior chamber for a dose of 1 mg of intracameral  antibiotic at the completion of the case. Miostat was placed into the anterior chamber to constrict the pupil.  No wound leaks were noted.  Topical Vigamox drops and Maxitrol ointment were applied to the eye.  The patient was taken to the recovery room in stable condition without complications of anesthesia or surgery.   ____________________________ Wyonia Hough, MD crb:mw D: 05/17/2014 09:01:56 ET T: 05/17/2014 13:19:19 ET JOB#: [id number]  cc: Wyonia Hough, MD, <Dictator> Leandrew Koyanagi MD ELECTRONICALLY SIGNED 05/23/2014 13:27

## 2014-09-19 ENCOUNTER — Other Ambulatory Visit (HOSPITAL_COMMUNITY): Payer: Self-pay | Admitting: Cardiovascular Disease

## 2014-09-19 DIAGNOSIS — I701 Atherosclerosis of renal artery: Secondary | ICD-10-CM

## 2014-09-24 ENCOUNTER — Ambulatory Visit (HOSPITAL_COMMUNITY): Payer: Medicare Other | Attending: Cardiology

## 2014-09-24 ENCOUNTER — Telehealth: Payer: Self-pay | Admitting: Cardiovascular Disease

## 2014-09-24 DIAGNOSIS — I701 Atherosclerosis of renal artery: Secondary | ICD-10-CM | POA: Diagnosis not present

## 2014-09-24 NOTE — Telephone Encounter (Signed)
Walk in pt form-pt has questions about BP meds-gave to [facility]

## 2014-10-12 ENCOUNTER — Encounter: Payer: Self-pay | Admitting: Family

## 2014-10-17 ENCOUNTER — Other Ambulatory Visit: Payer: Self-pay | Admitting: Family

## 2014-10-17 ENCOUNTER — Encounter: Payer: Self-pay | Admitting: Family

## 2014-10-17 ENCOUNTER — Ambulatory Visit (INDEPENDENT_AMBULATORY_CARE_PROVIDER_SITE_OTHER): Payer: Medicare Other | Admitting: Family

## 2014-10-17 ENCOUNTER — Ambulatory Visit (HOSPITAL_COMMUNITY)
Admission: RE | Admit: 2014-10-17 | Discharge: 2014-10-17 | Disposition: A | Discharge status: Home / Self Care | Payer: Medicare Other | Source: Ambulatory Visit | Attending: Family | Admitting: Family

## 2014-10-17 VITALS — BP 140/74 | HR 61 | Resp 16 | Ht 74.0 in | Wt 236.0 lb

## 2014-10-17 DIAGNOSIS — I6523 Occlusion and stenosis of bilateral carotid arteries: Secondary | ICD-10-CM

## 2014-10-17 DIAGNOSIS — I6522 Occlusion and stenosis of left carotid artery: Secondary | ICD-10-CM | POA: Insufficient documentation

## 2014-10-17 DIAGNOSIS — Z48812 Encounter for surgical aftercare following surgery on the circulatory system: Secondary | ICD-10-CM

## 2014-10-17 DIAGNOSIS — Z9889 Other specified postprocedural states: Secondary | ICD-10-CM | POA: Diagnosis not present
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
## 2014-10-17 NOTE — Patient Instructions (Signed)
Stroke Prevention Some medical conditions and behaviors are associated with an increased chance of having a stroke. You may prevent a stroke by making healthy choices and managing medical conditions. HOW CAN I REDUCE MY RISK OF HAVING A STROKE?   Stay physically active. Get at least 30 minutes of activity on most or all days.  Do not smoke. It may also be helpful to avoid exposure to secondhand smoke.  Limit alcohol use. Moderate alcohol use is considered to be:  No more than 2 drinks per day for men.  No more than 1 drink per day for nonpregnant women.  Eat healthy foods. This involves:  Eating 5 or more servings of fruits and vegetables a day.  Making dietary changes that address high blood pressure (hypertension), high cholesterol, diabetes, or obesity.  Manage your cholesterol levels.  Making food choices that are high in fiber and low in saturated fat, trans fat, and cholesterol may control cholesterol levels.  Take any prescribed medicines to control cholesterol as directed by your health care provider.  Manage your diabetes.  Controlling your carbohydrate and sugar intake is recommended to manage diabetes.  Take any prescribed medicines to control diabetes as directed by your health care provider.  Control your hypertension.  Making food choices that are low in salt (sodium), saturated fat, trans fat, and cholesterol is recommended to manage hypertension.  Take any prescribed medicines to control hypertension as directed by your health care provider.  Maintain a healthy weight.  Reducing calorie intake and making food choices that are low in sodium, saturated fat, trans fat, and cholesterol are recommended to manage weight.  Stop drug abuse.  Avoid taking birth control pills.  Talk to your health care provider about the risks of taking birth control pills if you are over 35 years old, smoke, get migraines, or have ever had a blood clot.  Get evaluated for sleep  disorders (sleep apnea).  Talk to your health care provider about getting a sleep evaluation if you snore a lot or have excessive sleepiness.  Take medicines only as directed by your health care provider.  For some people, aspirin or blood thinners (anticoagulants) are helpful in reducing the risk of forming abnormal blood clots that can lead to stroke. If you have the irregular heart rhythm of atrial fibrillation, you should be on a blood thinner unless there is a good reason you cannot take them.  Understand all your medicine instructions.  Make sure that other conditions (such as anemia or atherosclerosis) are addressed. SEEK IMMEDIATE MEDICAL CARE IF:   You have sudden weakness or numbness of the face, arm, or leg, especially on one side of the body.  Your face or eyelid droops to one side.  You have sudden confusion.  You have trouble speaking (aphasia) or understanding.  You have sudden trouble seeing in one or both eyes.  You have sudden trouble walking.  You have dizziness.  You have a loss of balance or coordination.  You have a sudden, severe headache with no known cause.  You have new chest pain or an irregular heartbeat. Any of these symptoms may represent a serious problem that is an emergency. Do not wait to see if the symptoms will go away. Get medical help at once. Call your local emergency services (911 in [place] Do not drive yourself to the hospital. Document Released: 05/07/2004 Document Revised: 08/14/2013 Document Reviewed: 09/30/2012 Exit[employer] Patient Information 2015 [employer]. This information is not intended to replace advice given   to you by your health care provider. Make sure you discuss any questions you have with your health care provider.  

## 2014-12-28 ENCOUNTER — Encounter: Payer: Self-pay | Admitting: Cardiovascular Disease

## 2015-03-15 ENCOUNTER — Ambulatory Visit: Payer: Medicare Other | Admitting: Cardiovascular Disease

## 2015-03-16 HISTORY — PX: CAROTID ENDARTERECTOMY: SUR193

## 2015-03-25 ENCOUNTER — Ambulatory Visit (INDEPENDENT_AMBULATORY_CARE_PROVIDER_SITE_OTHER): Payer: Medicare Other | Admitting: Cardiovascular Disease

## 2015-03-25 ENCOUNTER — Encounter: Payer: Self-pay | Admitting: Cardiovascular Disease

## 2015-03-25 VITALS — BP 130/58 | HR 86 | Ht 74.0 in | Wt 239.8 lb

## 2015-03-25 DIAGNOSIS — I10 Essential (primary) hypertension: Secondary | ICD-10-CM | POA: Diagnosis not present

## 2015-03-25 DIAGNOSIS — I2510 Atherosclerotic heart disease of native coronary artery without angina pectoris: Secondary | ICD-10-CM | POA: Diagnosis not present

## 2015-03-25 DIAGNOSIS — E782 Mixed hyperlipidemia: Secondary | ICD-10-CM

## 2015-03-25 NOTE — Progress Notes (Signed)
Cardiology Office Note Date:  03/25/2015   ID:  Leonard Brace Sr., DOB 1930-02-26, MRN [id number]  PCP:  Leonard Peters, MD  Cardiologist:  Leonard Mocha, MD    Chief Complaint  Patient presents with  . Back Pain     History of Present Illness: Leonard Zucca Sr. is a 81 y.o. male who presents for follow-up of CAD status post CABG in 2011. He also has peripheral arterial disease and underwent renal artery stenting in 2012. He underwent bilateral carotid endarterectomies in 2013.  In review of records since his last visit one year ago, patient has had a Leonard Berg embolus. 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 He was initially treated with IV heparin and then transitioned to Xarelto. At the time he had evidence of an acute DVT in the left femoral vein.   from a cardiac perspective the patient is doing well. He denies chest pain, chest pressure, or  Palpitations. He's had no lightheadedness or syncope. He continues to have mild shortness of breath with activity , but his breathing is greatly improved since the time of his hospitalization with pulmonary embolus. He has been maintained on Xarelto and low-dose aspirin. It appears that plans are for him to continue anticoagulation lifelong now that he has had a second pulmonary embolus. The patient is most limited by back and leg problems. He describes low back pain radiating into both legs , worse with walking.  Past Medical History  Diagnosis Date  . Hypertension   . Hyperlipidemia   . Pulmonary embolism (Leonard Berg) 2009    hx of  . DJD (degenerative joint disease)   . Arrhythmia     PAROXYSMAL ATRIAL FIBRILLATION  . RBBB (right bundle branch block)   . Unstable angina ([place])   . Arthritis     OSTEOARTHRITIS  . Anemia   . Thrombocytopenia ([place])   . History of BPH   . Thyroid disease     HYPOTHYROIDISM  . DVT (deep venous thrombosis)  ([facility]ard)   . Coronary artery disease 8/11    CABG...LM EMERGENT WITH IABP sees Dr. Legrand Como Ihor Meinzer  . Chronic back pain   . Cataracts, bilateral   . Renal artery stenosis ([place])   . Hypothyroidism     Past Surgical History  Procedure Laterality Date  . Replacement total knee  2006    RIGHT  . Coronary artery bypass graft  11-21-09    EMERGENT X 3 GRAFTING. ..[name], MD, cc: [name]  . Mandible surgery    . Foot surgery    . Renal artery stent  2010  . Total hip arthroplasty      right  . Endarterectomy  01/28/2012    Procedure: ENDARTERECTOMY CAROTID;  Surgeon: [name], MD;  Location: [place];  Service: Vascular;  Laterality: Right;  with Primary Closure of Artery  . Endarterectomy  03/15/2012    Procedure: ENDARTERECTOMY CAROTID;  Surgeon: [name], MD;  Location: [place];  Service: Vascular;  Laterality: Left;  . Carotid endarterectomy Right Oct. 17, 2013    CE  . Carotid endarterectomy Left Dec. 3, 2016    CE  . Blood clot  Feb.20,  2016    Pulmonary Embolism    Current Outpatient Prescriptions  Medication Sig Dispense Refill  . amLODipine (NORVASC) 5 MG tablet Take 5 mg by mouth daily.   4  .  aspirin 81 MG tablet Take 81 mg by mouth daily.      . cetirizine (ZYRTEC) 10 MG tablet Take 10 mg by mouth daily.    . fluticasone (FLONASE) 50 MCG/ACT nasal spray Place 2 sprays into the nose daily as needed for allergies or rhinitis.     . furosemide (LASIX) 40 MG tablet Take 40 mg by mouth daily.    Marland Kitchen levothyroxine (SYNTHROID, LEVOTHROID) 125 MCG tablet Take 125 mcg by mouth daily.      . metoprolol succinate (TOPROL XL) 25 MG 24 hr tablet Take 1 tablet (25 mg total) by mouth daily. 30 tablet 10  . NASONEX 50 MCG/ACT nasal spray Place 2 sprays into the nose daily as needed (allergies).   0  . rosuvastatin (CRESTOR) 10 MG tablet Take 10 mg by mouth at bedtime.    . Tamsulosin HCl (FLOMAX) 0.4 MG CAPS Take 0.4 mg by mouth daily.      Alveda Reasons  20 MG TABS tablet Take 20 mg by mouth daily.   3   No current facility-administered medications for this visit.    Allergies:   Review of patient's allergies indicates no known allergies.   Social History:  The patient  reports that he quit smoking about 40 years ago. He quit smokeless tobacco use about 13 years ago. He reports that he drinks about 1.2 oz of alcohol per week. He reports that he does not use illicit drugs.   Family History:  The patient's  family history includes Cancer in his sister; Cancer (age of onset: 2) in his father; Diabetes in his brother; Heart attack (age of onset: 65) in his brother; Hyperlipidemia in his mother; Liver cancer (age of onset: 63) in his sister; Lung cancer (age of onset: 1) in his father; Stroke (age of onset: 42) in his mother.    ROS:  Please see the history of present illness.  Otherwise, review of systems is positive for  Back pain, muscle pain , balance problems.  All other systems are reviewed and negative.    PHYSICAL EXAM: VS:  BP 130/58 mmHg  Pulse 86  Ht 6\' 2"  (1.88 m)  Wt 239 lb 12.8 oz (108.773 kg)  BMI 30.78 kg/m2 , BMI Body mass index is 30.78 kg/(m^2). GEN: Well nourished, well developed, pleasant elderly male in no acute distress HEENT: normal Neck: no JVD, no masses. No carotid bruits Cardiac: RRR without murmur or gallop                Respiratory:  clear to auscultation bilaterally, normal work of breathing GI: soft, nontender, nondistended, + BS MS: no deformity or atrophy Ext: no pretibial edema, pedal pulses 2+= bilaterally Skin: warm and dry, no rash Neuro:  Strength and sensation are intact Psych: euthymic mood, full affect  EKG:  EKG is ordered today. The ekg ordered today shows NSR 86 bpm, RBBB, no change from previous tracings.  Recent Labs: 06/02/2014: ALT 25; B Natriuretic Peptide 42.8 06/03/2014: TSH 2.395 06/04/2014: BUN 18; Creatinine, Ser 1.37*; Hemoglobin 14.6; Platelets 140*; Potassium 3.6; Sodium 139     Lipid Panel     Component Value Date/Time   CHOL 172 02/21/2013 0816   TRIG 171.0* 02/21/2013 0816   HDL 41.60 02/21/2013 0816   CHOLHDL 4 02/21/2013 0816   VLDL 34.2 02/21/2013 0816   LDLCALC 96 02/21/2013 0816   LDLDIRECT 100.8 08/12/2010 1523      Wt Readings from Last 3 Encounters:  03/25/15 239 lb 12.8  oz (108.773 kg)  10/17/14 236 lb (107.049 kg)  06/03/14 232 lb 12.9 oz (105.6 kg)     Cardiac Studies Reviewed: 2D Echo 2.22.2016: Study Conclusions  - Left ventricle: The cavity size was normal. Wall thickness was increased in a pattern of mild LVH. There was mild focal basal hypertrophy of the septum. Systolic function was normal. The estimated ejection fraction was in the range of 55% to 60%. Regional wall motion abnormalities cannot be excluded. Doppler parameters are consistent with abnormal left ventricular relaxation (grade 1 diastolic dysfunction).  Impressions:  - Normal LV function; grade 1 diastolic dysfunction; right heart not well visualized but appears normal in size and function.  ASSESSMENT AND PLAN: 1.   CAD status post CABG : No symptoms of angina. Medical program reviewed and will be continued without changes. The patient is on low-dose aspirin, beta blocker, and a statin drug. LV function is normal. Echo from February 2016 reviewed as above.   2. Carotid artery stenosis without history of stroke: The patient is status post bilateral carotid endarterectomy. Followed by vascular surgery.  3. Renal artery stenosis: patient is status post renal artery stenting. Most recent duplex scan reviewed and there was poor visualization of his renal arteries. As he is clinically stable , no plans for further imaging studies. Blood pressure remains well-controlled.  4. Hypertension with chronic kidney disease stage III : blood pressure stable on current medications.  Current medicines are reviewed with the patient today.  The patient does not have  concerns regarding medicines.  Labs/ tests ordered today include:   Orders Placed This Encounter  Procedures  . EKG 12-Lead    Disposition:   FU one year  Signed, Leonard Mocha, MD  03/25/2015 2:11 PM    [facility] [address], [place], [place]  [address] Phone: [phone]; Fax: [phone]

## 2015-03-25 NOTE — Patient Instructions (Signed)

## 2015-05-23 ENCOUNTER — Ambulatory Visit (INDEPENDENT_AMBULATORY_CARE_PROVIDER_SITE_OTHER): Payer: Medicare Other | Admitting: Primary Care

## 2015-05-23 ENCOUNTER — Encounter: Payer: Self-pay | Admitting: Primary Care

## 2015-05-23 VITALS — BP 136/64 | HR 76 | Temp 98.0°F | Ht 74.0 in | Wt 188.8 lb

## 2015-05-23 DIAGNOSIS — I2699 Other pulmonary embolism without acute cor pulmonale: Secondary | ICD-10-CM | POA: Diagnosis not present

## 2015-05-23 DIAGNOSIS — I10 Essential (primary) hypertension: Secondary | ICD-10-CM

## 2015-05-23 DIAGNOSIS — N400 Enlarged prostate without lower urinary tract symptoms: Secondary | ICD-10-CM

## 2015-05-23 DIAGNOSIS — E782 Mixed hyperlipidemia: Secondary | ICD-10-CM

## 2015-05-23 DIAGNOSIS — M150 Primary generalized (osteo)arthritis: Secondary | ICD-10-CM | POA: Diagnosis not present

## 2015-05-23 DIAGNOSIS — M159 Polyosteoarthritis, unspecified: Secondary | ICD-10-CM

## 2015-05-23 DIAGNOSIS — E038 Other specified hypothyroidism: Secondary | ICD-10-CM

## 2015-05-23 DIAGNOSIS — N183 Chronic kidney disease, stage 3 unspecified: Secondary | ICD-10-CM | POA: Insufficient documentation

## 2015-05-23 DIAGNOSIS — N19 Unspecified kidney failure: Secondary | ICD-10-CM

## 2015-05-23 NOTE — Patient Instructions (Signed)
Please schedule a physical with me within the next 3-6 months. You may also schedule a lab only appointment 3-4 days prior. We will discuss your lab results in detail during your physical.  It was a pleasure to meet you today! Please don't hesitate to call me with any questions. Welcome to [facility]!

## 2015-05-23 NOTE — Assessment & Plan Note (Signed)
Managed on atorvastatin  Last lipid panel on file from 2014 was stable. Will repeat during upcoming physical.

## 2015-05-23 NOTE — Progress Notes (Signed)
Subjective:    Patient ID: Leonard Brace Sr., male    DOB: [birth year], 82 y.o.   MRN: [id number]  HPI  [name] Is an 82 year old male who presents today to establish care and discuss the problems mentioned below. Will obtain old records. His last physical was years ago. He is by himself and doesn't know much about his medical history as he cannot answer most of the questions below. His wife prepared a packet of medical history that he brought with him today.   1) Essential Hypertension: Diagnosed years ago. Currently managed on amlodipine 5 mg and Toprol XL 25 mg. BP stable in clinic today. His nephrologist completed labs during the Fall of 2016.  2) CAD: History of CABG in 2011. Currently follow with Dr. Burt Berg with cardiology annually. Denies chest Berg, shortness of breath, dizziness.  3) Paroxysmal Atrial Fibrillation: Currently managed on Xarelto 20 mg for about one year.  for about 1 year. Based off of his chart and packet from home, he has a history of PE in 2009 and 2016 and DVT.   4) Hypothyroidism: Diagnosed years ago. Currently managed on levothyroxine 125 mcg. TSH in 05/2014 normal.  5) BPH: Currently managed on flomax 0.4 mg. He's unsure of his last PSA.  6) Hyperlipidemia: Diagnosed years ago. Currently managed on atorvastatin 10 mg. He endorses a poor diet and does not exercise.  7) Renal Failure: Currently managed at [facility] with Dr. Jimmy Berg once annually. Currently on furosemide 20 mg. BMP from 05/2014 (last hospitalization) with creatinine of 1.37 and GFR of 54. He endorses lab draws through Dr. Deterding's office in the fall of 2016.  8) Leonard Berg: Present for the past several years. He is following with Dr. Maryjean Berg (neurosurgery) and Dr. Vertell Berg (ortho). He's undergone 2 injections with neurosurgery and doesn't believe they are working well. He is due 05/27/15 for a third injection. He will take tylenol or aspirin for his Berg.  He  follows with multiple providers including:  Dr. Burt Berg - Cardiology Dr. Murvin Berg -  Eye Dr. Scot Berg - Vascular and Vein Dr. Jimmy Berg - [facility] Dr. Maryjean Berg [facility] and Spine Dr. Vertell Berg - Ortho Dr. Sharlett Berg - Dermatology  Review of Systems  Constitutional: Negative for unexpected weight change.  HENT: Negative for rhinorrhea.   Respiratory: Negative for cough and shortness of breath.   Cardiovascular: Negative for chest Berg.  Gastrointestinal: Negative for diarrhea.       Bowel movements mostly everyday, sometimes firm movements. He's not drinking much water.  Genitourinary: Negative for difficulty urinating.  Musculoskeletal:       Leonard Berg  Skin: Negative for rash.  Allergic/Immunologic: Negative for environmental allergies.  Neurological: Negative for dizziness, numbness and headaches.  Psychiatric/Behavioral:       Denies concerns for anxiety or depression       Past Medical History  Diagnosis Date  . Hypertension   . Hyperlipidemia   . Pulmonary embolism (Leonard Berg) 2009    hx of  . DJD (degenerative joint disease)   . Arrhythmia     PAROXYSMAL ATRIAL FIBRILLATION  . RBBB (right bundle branch block)   . Unstable angina (Leonard Berg)   . Arthritis     OSTEOARTHRITIS  . Anemia   . Thrombocytopenia (Leonard Berg)   . History of BPH   . Thyroid disease     HYPOTHYROIDISM  . DVT (deep venous thrombosis) (Leonard Berg)   . Coronary artery disease 8/11  CABG.Marland KitchenMarland KitchenLM EMERGENT WITH IABP sees Leonard Berg  . Leonard Berg   . Cataracts, bilateral   . Renal artery stenosis (Leonard Berg)   . Hypothyroidism     Social History   Social History  . Marital Status: Married    Spouse Name: N/A  . Number of Children: 2  . Years of Education: N/A   Occupational History  . 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 Smoking status: Former Smoker    Quit date: 04/13/1974  .  Smokeless tobacco: Former Systems developer    Quit date: 01/26/2002     Comment: He has smoked about 27-pack-year hx   . Alcohol Use: 1.2 oz/week    2 Cans of beer per week     Comment: He used to drink more heavily , but rarely drinks at the current time  . Drug Use: No  . Sexual Activity: Not on file   Other Topics Concern  . Not on file   Social History Narrative   LIVES IN [place] WITH HIS WIFE.   HE HAS 2 GROWN CHILDREN   HE IS RETIRED PRISON FIRM SUPERINTENDENT.   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   HE GOES TO THE [employer] 3 X WEEKLY.    Past Surgical History  Procedure Laterality Date  . Replacement total knee  2006    RIGHT  . Coronary artery bypass graft  11-21-09    EMERGENT X 3 GRAFTING. ..Leonard Prescott Gum, MD, cc: Leonard Berg  . Mandible surgery    . Foot surgery    . Renal artery stent  2010  . Total hip arthroplasty      right  . Endarterectomy  01/28/2012    Procedure: ENDARTERECTOMY CAROTID;  Surgeon: Leonard Mould, MD;  Location: [place];  Service: Vascular;  Laterality: Right;  with Primary Closure of Artery  . Endarterectomy  03/15/2012    Procedure: ENDARTERECTOMY CAROTID;  Surgeon: Leonard Mould, MD;  Location: [place];  Service: Vascular;  Laterality: Left;  . Carotid endarterectomy Right Oct. 17, 2013    CE  . Carotid endarterectomy Left Dec. 3, 2016    CE  . Blood clot  Feb.20,  2016    Pulmonary Embolism  . Cataract extraction  05/2014,06/2014    Family History  Problem Relation Age of Onset  . Lung cancer Father 25    deceased   . Cancer Father 63    LUNG  . Stroke Mother 73    DECEASED   . Hyperlipidemia Mother   . Heart attack Brother 29    deceased  . Diabetes Brother   . Liver cancer Sister 89    deceased   . Cancer Sister     No Known Allergies  Current Outpatient Prescriptions on File Prior to Visit  Medication Sig Dispense Refill  . amLODipine (NORVASC) 5 MG tablet Take 5 mg  by mouth daily.   4  . aspirin 81 MG tablet Take 81 mg by mouth daily.      . cetirizine (ZYRTEC) 10 MG tablet Take 10 mg by mouth daily.    . furosemide (LASIX) 40 MG tablet Take 40 mg by mouth daily.    Marland Kitchen levothyroxine (SYNTHROID, LEVOTHROID) 125 MCG tablet Take 125 mcg by mouth daily.      . metoprolol succinate (TOPROL XL)  25 MG 24 hr tablet Take 1 tablet (25 mg total) by mouth daily. 30 tablet 10  . NASONEX 50 MCG/ACT nasal spray Place 2 sprays into the nose daily as needed (allergies).   0  . rosuvastatin (CRESTOR) 10 MG tablet Take 10 mg by mouth at bedtime.    . Tamsulosin HCl (FLOMAX) 0.4 MG CAPS Take 0.4 mg by mouth daily.      Alveda Reasons 20 MG TABS tablet Take 20 mg by mouth daily.   3   No current facility-administered medications on file prior to visit.    BP 136/64 mmHg  Pulse 76  Temp(Src) 98 F (36.7 C) (Oral)  Ht 6\' 2"  (1.88 m)  Wt 188 lb 12.8 oz (85.639 kg)  BMI 24.23 kg/m2  SpO2 97%    Objective:   Physical Exam  Constitutional: He is oriented to person, place, and time. He appears well-nourished.  HENT:  Head: Normocephalic.  Neck: Neck supple.  Cardiovascular: Normal rate and regular rhythm.   Pulmonary/Chest: Effort normal and breath sounds normal.  Neurological: He is alert and oriented to person, place, and time.  Skin: Skin is warm and dry.  Psychiatric: He has a normal mood and affect.          Assessment & Plan:

## 2015-05-23 NOTE — Progress Notes (Signed)
Pre visit review using our clinic review tool, if applicable. No additional management support is needed unless otherwise documented below in the visit note. 

## 2015-05-23 NOTE — Assessment & Plan Note (Signed)
Follows with [facility]. Last BMP with creatinine of 1.37 and GFR of 54. Currently on loop diuretic.

## 2015-05-23 NOTE — Assessment & Plan Note (Signed)
TSH in 05/2014 normal. Continue levothyroxine 125 mcg. Will check at upcoming physical.

## 2015-05-23 NOTE — Assessment & Plan Note (Signed)
Chronic back pain, managed by neurosurgery and ortho. Currently receiving injections.

## 2015-05-23 NOTE — Assessment & Plan Note (Signed)
Managed on xalerto 20 mg daily.

## 2015-05-23 NOTE — Assessment & Plan Note (Signed)
Currently managed on Toprol Xl 25, amlodipine 5. BP stable today, continue current regimen.

## 2015-05-23 NOTE — Assessment & Plan Note (Signed)
Managed on Flomax, denies difficulty urinating.  Continue.

## 2015-05-27 DIAGNOSIS — M545 Low back pain: Secondary | ICD-10-CM | POA: Diagnosis not present

## 2015-06-07 DIAGNOSIS — H43811 Vitreous degeneration, right eye: Secondary | ICD-10-CM | POA: Diagnosis not present

## 2015-06-10 ENCOUNTER — Telehealth: Payer: Self-pay | Admitting: Primary Care

## 2015-06-10 NOTE — Telephone Encounter (Signed)
The refill request needs to go to his cardiologist.

## 2015-06-10 NOTE — Telephone Encounter (Signed)
Received faxed refill request for   XARELTO 20 MG TABS tablet    Take 20 mg by mouth daily.  Patient had his new patient appointment on 05/23/2015.

## 2015-06-11 NOTE — Telephone Encounter (Signed)
Call and spoken to patient's wife that patient needs to contact cardiologist. Inform her that patient needs to contact Dr [name] for refills.

## 2015-06-12 ENCOUNTER — Other Ambulatory Visit: Payer: Self-pay

## 2015-06-12 DIAGNOSIS — M4806 Spinal stenosis, lumbar region: Secondary | ICD-10-CM | POA: Diagnosis not present

## 2015-06-12 DIAGNOSIS — M4316 Spondylolisthesis, lumbar region: Secondary | ICD-10-CM | POA: Diagnosis not present

## 2015-06-12 DIAGNOSIS — M5417 Radiculopathy, lumbosacral region: Secondary | ICD-10-CM | POA: Diagnosis not present

## 2015-06-12 DIAGNOSIS — M545 Low back pain: Secondary | ICD-10-CM | POA: Diagnosis not present

## 2015-06-12 DIAGNOSIS — M5126 Other intervertebral disc displacement, lumbar region: Secondary | ICD-10-CM | POA: Diagnosis not present

## 2015-06-12 MED ORDER — XARELTO 20 MG PO TABS: 20.0000 mg | ORAL_TABLET | Freq: Every day | ORAL | Status: DC

## 2015-06-13 DIAGNOSIS — M4806 Spinal stenosis, lumbar region: Secondary | ICD-10-CM | POA: Diagnosis not present

## 2015-06-13 DIAGNOSIS — M5417 Radiculopathy, lumbosacral region: Secondary | ICD-10-CM | POA: Diagnosis not present

## 2015-06-14 DIAGNOSIS — M5126 Other intervertebral disc displacement, lumbar region: Secondary | ICD-10-CM | POA: Diagnosis not present

## 2015-06-14 DIAGNOSIS — M545 Low back pain: Secondary | ICD-10-CM | POA: Diagnosis not present

## 2015-06-14 DIAGNOSIS — I10 Essential (primary) hypertension: Secondary | ICD-10-CM | POA: Diagnosis not present

## 2015-06-14 DIAGNOSIS — M4806 Spinal stenosis, lumbar region: Secondary | ICD-10-CM | POA: Diagnosis not present

## 2015-06-14 DIAGNOSIS — Z6829 Body mass index (BMI) 29.0-29.9, adult: Secondary | ICD-10-CM | POA: Diagnosis not present

## 2015-06-14 DIAGNOSIS — M4316 Spondylolisthesis, lumbar region: Secondary | ICD-10-CM | POA: Diagnosis not present

## 2015-06-14 DIAGNOSIS — M5416 Radiculopathy, lumbar region: Secondary | ICD-10-CM | POA: Diagnosis not present

## 2015-06-25 ENCOUNTER — Telehealth: Payer: Self-pay | Admitting: Cardiovascular Disease

## 2015-06-25 NOTE — Telephone Encounter (Signed)
Our office faxed clearance to Dr Vertell Limber today for Lumbar fusion.  Per Dr Burt Knack the pt is okay to proceed at low cardiac risk.  Our office does need to address how to handle the pt's Xarelto and ASA prior to surgery.  I will forward this message to Dr Burt Knack to address medications.

## 2015-06-25 NOTE — Telephone Encounter (Signed)
Request for surgical clearance:  1. What type of surgery is being performed? Lumbar fusion    2. When is this surgery scheduled? n/a  3. Are there any medications that need to be held prior to surgery and how long? Aspirin    4. Name of physician performing surgery? Dr Vertell Limber  5. What is your office phone and fax number? Fax #- [phone] 6.

## 2015-06-25 NOTE — Telephone Encounter (Signed)
Ok to hold ASA x 7 days and Xarelto x 72 hours if that is adequate for Dr Vertell Limber. thx

## 2015-06-26 ENCOUNTER — Other Ambulatory Visit: Payer: Self-pay | Admitting: Neurosurgery

## 2015-06-26 NOTE — Telephone Encounter (Signed)
Telephone encounter faxed to [phone], Attn: Jessica.

## 2015-07-08 ENCOUNTER — Telehealth: Payer: Self-pay

## 2015-07-08 DIAGNOSIS — H43811 Vitreous degeneration, right eye: Secondary | ICD-10-CM | POA: Diagnosis not present

## 2015-07-08 NOTE — Telephone Encounter (Signed)
Leonard Berg dropped off his Disability placard to be filled out and signed. Call when ready. Placed in RX box up front.

## 2015-07-09 NOTE — Telephone Encounter (Signed)
Placed form in [place].  Called patient. Spoken and notified patient's wife that Leonard Berg is out of the office until Thursday. Patient's wife stated that it is okay and can wait until then.

## 2015-07-11 NOTE — Telephone Encounter (Signed)
I just learned at conference this week that NP's cannot sign handicap placards.  Amy, this patient has back pain and osteoarthritis which significantly limits his ability to walk long distances. Would you mind signing? I've placed his form on your desk.  Please notify Vallarie Mare, once you're finished and we will contact the patient for pick up.

## 2015-07-11 NOTE — Telephone Encounter (Signed)
No problem.

## 2015-07-12 NOTE — Telephone Encounter (Signed)
Called patient and spoken to patient's wife that form is ready for pick up. Left in front office.

## 2015-07-31 ENCOUNTER — Encounter (HOSPITAL_COMMUNITY)
Admission: RE | Admit: 2015-07-31 | Discharge: 2015-07-31 | Disposition: A | Discharge status: Home / Self Care | Payer: Medicare Other | Source: Ambulatory Visit | Attending: Neurosurgery | Admitting: Neurosurgery

## 2015-07-31 ENCOUNTER — Telehealth: Payer: Self-pay | Admitting: *Deleted

## 2015-07-31 ENCOUNTER — Other Ambulatory Visit: Payer: Self-pay | Admitting: *Deleted

## 2015-07-31 ENCOUNTER — Encounter (HOSPITAL_COMMUNITY): Payer: Self-pay

## 2015-07-31 DIAGNOSIS — M4316 Spondylolisthesis, lumbar region: Secondary | ICD-10-CM | POA: Insufficient documentation

## 2015-07-31 DIAGNOSIS — Z0183 Encounter for blood typing: Secondary | ICD-10-CM | POA: Diagnosis not present

## 2015-07-31 DIAGNOSIS — Z01812 Encounter for preprocedural laboratory examination: Secondary | ICD-10-CM | POA: Insufficient documentation

## 2015-07-31 HISTORY — DX: Acute myocardial infarction, unspecified: I21.9

## 2015-07-31 HISTORY — DX: Gastro-esophageal reflux disease without esophagitis: K21.9

## 2015-07-31 LAB — TYPE AND SCREEN
ABO/RH(D): A POS
ANTIBODY SCREEN: NEGATIVE

## 2015-07-31 LAB — BASIC METABOLIC PANEL
ANION GAP: 11 (ref 5–15)
BUN: 15 mg/dL (ref 6–20)
CO2: 25 mmol/L (ref 22–32)
Calcium: 9.2 mg/dL (ref 8.9–10.3)
Chloride: 105 mmol/L (ref 101–111)
Creatinine, Ser: 1.36 mg/dL — ABNORMAL HIGH (ref 0.61–1.24)
GFR calc Af Amer: 54 mL/min — ABNORMAL LOW (ref >60)
GFR calc non Af Amer: 46 mL/min — ABNORMAL LOW (ref >60)
GLUCOSE: 132 mg/dL — AB (ref 65–99)
POTASSIUM: 3.8 mmol/L (ref 3.5–5.1)
Sodium: 141 mmol/L (ref 135–145)

## 2015-07-31 LAB — CBC
HEMATOCRIT: 45.3 % (ref 39.0–52.0)
Hemoglobin: 15.1 g/dL (ref 13.0–17.0)
MCH: 32.3 pg (ref 26.0–34.0)
MCHC: 33.3 g/dL (ref 30.0–36.0)
MCV: 96.8 fL (ref 78.0–100.0)
PLATELETS: 174 10*3/uL (ref 150–400)
RBC: 4.68 MIL/uL (ref 4.22–5.81)
RDW: 12.8 % (ref 11.5–15.5)
WBC: 6.7 10*3/uL (ref 4.0–10.5)

## 2015-07-31 LAB — SURGICAL PCR SCREEN
MRSA, PCR: NEGATIVE
STAPHYLOCOCCUS AUREUS: NEGATIVE

## 2015-07-31 LAB — PROTIME-INR
INR: 1.07 (ref 0.00–1.49)
Prothrombin Time: 14.1 seconds (ref 11.6–15.2)

## 2015-07-31 MED ORDER — FUROSEMIDE 40 MG PO TABS: 40.0000 mg | ORAL_TABLET | Freq: Every day | ORAL | Status: DC

## 2015-07-31 NOTE — Telephone Encounter (Signed)
Called pharmacy as [facility] is out until the 24th of April, they had been filling it by PCP whom is deceased per Gerald Stabs, yet pt has established with a new PCP whom i advised pharmacy to send refill since he has established with her and pt has not had it filled with [place] ever. Christ said he would send to PCP.

## 2015-07-31 NOTE — Progress Notes (Signed)
   07/31/15 1419  OBSTRUCTIVE SLEEP APNEA  Have you ever been diagnosed with sleep apnea through a sleep study? No  Do you snore loudly (loud enough to be heard through closed doors)?  1  Do you often feel tired, fatigued, or sleepy during the daytime (such as falling asleep during driving or talking to someone)? 0  Has anyone observed you stop breathing during your sleep? 0  Do you have, or are you being treated for high blood pressure? 1  BMI more than 35 kg/m2? 0  Age > 50 (1-yes) 1  Neck circumference greater than:Male 16 inches or larger, Male 17inches or larger? 1  Male Gender (Yes=1) 1  Obstructive Sleep Apnea Score 5  Score 5 or greater  Results sent to PCP

## 2015-07-31 NOTE — Pre-Procedure Instructions (Signed)
Leonard Back Leverich Sr.  07/31/2015      [facility] - [place], [place] - [address] [place] [address] Phone: [phone] Fax: [phone]    Your procedure is scheduled on   THURSDAY  08/08/15  Report to [facility] Admitting at 815 A.M.  Call this number if you have problems the morning of surgery:  [phone]   Remember:  Do not eat food or drink liquids after midnight.  Take these medicines the morning of surgery with A SIP OF WATER   AMLODIPINE (NORVASC), LEVOTHYROXINE, METOPROLOL (TOPROL), RANITIDINE (ZANTAC), TAMSULOSIN (FLOMAX)      (HOLD ASPIRIN 7 DAYS AND XARELTO 72 HOURS 08/04/15 (LAST DOSE) PRIOR TO SURGERY, NO IBUPROFEN/ MOTRIN/ ADVIL, GOODY POWDERS/ BC'S, HERBAL MEDICINES, MULTIVITAMIN)   Do not wear jewelry, make-up or nail polish.  Do not wear lotions, powders, or perfumes.  You may wear deodorant.  Do not shave 48 hours prior to surgery.  Men may shave face and neck.  Do not bring valuables to the hospital.  [facility] is not responsible for any belongings or valuables.  Contacts, dentures or bridgework may not be worn into surgery.  Leave your suitcase in the car.  After surgery it may be brought to your room.  For patients admitted to the hospital, discharge time will be determined by your treatment team.  Patients discharged the day of surgery will not be allowed to drive home.   Name and phone number of your driver:   Special instructions:  [facility] - Preparing for Surgery  Before surgery, you can play an important role.  Because skin is not sterile, your skin needs to be as free of germs as possible.  You can reduce the number of germs on you skin by washing with CHG (chlorahexidine gluconate) soap before surgery.  CHG is an antiseptic cleaner which kills germs and bonds with the skin to continue killing germs even after washing.  Please DO NOT use if you have an allergy to CHG or antibacterial soaps.   If your skin becomes reddened/irritated stop using the CHG and inform your nurse when you arrive at Short Stay.  Do not shave (including legs and underarms) for at least 48 hours prior to the first CHG shower.  You may shave your face.  Please follow these instructions carefully:   1.  Shower with CHG Soap the night before surgery and the                                morning of Surgery.  2.  If you choose to wash your hair, wash your hair first as usual with your       normal shampoo.  3.  After you shampoo, rinse your hair and body thoroughly to remove the                      Shampoo.  4.  Use CHG as you would any other liquid soap.  You can apply chg directly       to the skin and wash gently with scrungie or a clean washcloth.  5.  Apply the CHG Soap to your body ONLY FROM THE NECK DOWN.        Do not use on open wounds or open sores.  Avoid contact with your eyes,       ears, mouth and genitals (  private parts).  Wash genitals (private parts)       with your normal soap.  6.  Wash thoroughly, paying special attention to the area where your surgery        will be performed.  7.  Thoroughly rinse your body with warm water from the neck down.  8.  DO NOT shower/wash with your normal soap after using and rinsing off       the CHG Soap.  9.  Pat yourself dry with a clean towel.            10.  Wear clean pajamas.            11.  Place clean sheets on your bed the night of your first shower and do not        sleep with pets.  Day of Surgery  Do not apply any lotions/deoderants the morning of surgery.  Please wear clean clothes to the hospital/surgery center.    Please read over the following fact sheets that you were given. Pain Booklet, Coughing and Deep Breathing, Blood Transfusion Information, MRSA Information and Surgical Site Infection Prevention

## 2015-08-01 ENCOUNTER — Other Ambulatory Visit: Payer: Self-pay | Admitting: Primary Care

## 2015-08-01 DIAGNOSIS — I10 Essential (primary) hypertension: Secondary | ICD-10-CM

## 2015-08-01 MED ORDER — AMLODIPINE BESYLATE 5 MG PO TABS: 5.0000 mg | ORAL_TABLET | Freq: Every day | ORAL | Status: DC

## 2015-08-01 NOTE — Telephone Encounter (Signed)
Received fax refill request for  amlodipine (NORVASC) 5 MG tablet    Take 5 mg by mouth daily  Rx have not been prescribed by Anda Kraft. Last seen on 05/23/2015. CPE on 11/19/2015.

## 2015-08-07 MED ORDER — CEFAZOLIN SODIUM-DEXTROSE 2-4 GM/100ML-% IV SOLN
2.0000 g | INTRAVENOUS | Status: AC
  Administered 2015-08-08: 2 g via INTRAVENOUS
  Filled 2015-08-07: qty 100

## 2015-08-08 ENCOUNTER — Inpatient Hospital Stay (HOSPITAL_COMMUNITY)
Admission: AD | Admit: 2015-08-08 | Discharge: 2015-08-12 | DRG: 460 | Disposition: A | Discharge status: Home Health | Payer: Medicare Other | Source: Ambulatory Visit | Attending: Neurosurgery | Admitting: Neurosurgery

## 2015-08-08 ENCOUNTER — Encounter (HOSPITAL_COMMUNITY): Payer: Self-pay | Admitting: *Deleted

## 2015-08-08 ENCOUNTER — Inpatient Hospital Stay (HOSPITAL_COMMUNITY): Payer: Medicare Other

## 2015-08-08 ENCOUNTER — Inpatient Hospital Stay (HOSPITAL_COMMUNITY): Payer: Medicare Other | Admitting: Certified Registered"

## 2015-08-08 ENCOUNTER — Encounter (HOSPITAL_COMMUNITY)
Admission: AD | Disposition: A | Discharge status: Home Health | Payer: Self-pay | Source: Ambulatory Visit | Attending: Neurosurgery

## 2015-08-08 DIAGNOSIS — Z419 Encounter for procedure for purposes other than remedying health state, unspecified: Secondary | ICD-10-CM

## 2015-08-08 DIAGNOSIS — Z7901 Long term (current) use of anticoagulants: Secondary | ICD-10-CM

## 2015-08-08 DIAGNOSIS — E039 Hypothyroidism, unspecified: Secondary | ICD-10-CM | POA: Diagnosis not present

## 2015-08-08 DIAGNOSIS — M5116 Intervertebral disc disorders with radiculopathy, lumbar region: Secondary | ICD-10-CM | POA: Diagnosis not present

## 2015-08-08 DIAGNOSIS — Z6829 Body mass index (BMI) 29.0-29.9, adult: Secondary | ICD-10-CM

## 2015-08-08 DIAGNOSIS — I2510 Atherosclerotic heart disease of native coronary artery without angina pectoris: Secondary | ICD-10-CM | POA: Diagnosis not present

## 2015-08-08 DIAGNOSIS — I10 Essential (primary) hypertension: Secondary | ICD-10-CM | POA: Diagnosis not present

## 2015-08-08 DIAGNOSIS — Z7982 Long term (current) use of aspirin: Secondary | ICD-10-CM | POA: Diagnosis not present

## 2015-08-08 DIAGNOSIS — M4806 Spinal stenosis, lumbar region: Secondary | ICD-10-CM | POA: Diagnosis not present

## 2015-08-08 DIAGNOSIS — R339 Retention of urine, unspecified: Secondary | ICD-10-CM | POA: Diagnosis not present

## 2015-08-08 DIAGNOSIS — Z79899 Other long term (current) drug therapy: Secondary | ICD-10-CM

## 2015-08-08 DIAGNOSIS — M4802 Spinal stenosis, cervical region: Secondary | ICD-10-CM | POA: Diagnosis not present

## 2015-08-08 DIAGNOSIS — M5126 Other intervertebral disc displacement, lumbar region: Secondary | ICD-10-CM | POA: Diagnosis not present

## 2015-08-08 DIAGNOSIS — Z981 Arthrodesis status: Secondary | ICD-10-CM | POA: Diagnosis not present

## 2015-08-08 DIAGNOSIS — D649 Anemia, unspecified: Secondary | ICD-10-CM | POA: Diagnosis not present

## 2015-08-08 DIAGNOSIS — Z23 Encounter for immunization: Secondary | ICD-10-CM

## 2015-08-08 DIAGNOSIS — M549 Dorsalgia, unspecified: Secondary | ICD-10-CM | POA: Diagnosis not present

## 2015-08-08 DIAGNOSIS — M4316 Spondylolisthesis, lumbar region: Secondary | ICD-10-CM | POA: Diagnosis present

## 2015-08-08 HISTORY — PX: MAXIMUM ACCESS (MAS)POSTERIOR LUMBAR INTERBODY FUSION (PLIF) 2 LEVEL: SHX6369

## 2015-08-08 SURGERY — FOR MAXIMUM ACCESS (MAS) POSTERIOR LUMBAR INTERBODY FUSION (PLIF) 2 LEVEL
Anesthesia: General | Site: Spine Lumbar

## 2015-08-08 MED ORDER — GLYCOPYRROLATE 0.2 MG/ML IJ SOLN
INTRAMUSCULAR | Status: DC | PRN
  Administered 2015-08-08: 0.4 mg via INTRAVENOUS

## 2015-08-08 MED ORDER — BUPIVACAINE HCL (PF) 0.5 % IJ SOLN
INTRAMUSCULAR | Status: DC | PRN
Start: 2015-08-08 — End: 2015-08-08
  Administered 2015-08-08: 10 mL

## 2015-08-08 MED ORDER — KCL IN DEXTROSE-NACL 20-5-0.45 MEQ/L-%-% IV SOLN
INTRAVENOUS | Status: DC
  Administered 2015-08-08 – 2015-08-11 (×5): via INTRAVENOUS
  Filled 2015-08-08 (×9): qty 1000

## 2015-08-08 MED ORDER — SURGIFOAM 100 EX MISC
CUTANEOUS | Status: DC | PRN
  Administered 2015-08-08: 13:00:00 via TOPICAL

## 2015-08-08 MED ORDER — METOPROLOL SUCCINATE ER 25 MG PO TB24
25.0000 mg | ORAL_TABLET | Freq: Every day | ORAL | Status: DC
  Administered 2015-08-09 – 2015-08-11 (×3): 25 mg via ORAL
  Filled 2015-08-08 (×4): qty 1

## 2015-08-08 MED ORDER — LORATADINE 10 MG PO TABS
10.0000 mg | ORAL_TABLET | Freq: Every day | ORAL | Status: DC
  Administered 2015-08-09 – 2015-08-12 (×4): 10 mg via ORAL
  Filled 2015-08-08 (×4): qty 1

## 2015-08-08 MED ORDER — FLUTICASONE PROPIONATE 50 MCG/ACT NA SUSP
2.0000 | Freq: Every day | NASAL | Status: DC
  Administered 2015-08-08 – 2015-08-12 (×3): 2 via NASAL
  Filled 2015-08-08: qty 16

## 2015-08-08 MED ORDER — BISACODYL 10 MG RE SUPP
10.0000 mg | Freq: Every day | RECTAL | Status: DC | PRN
  Administered 2015-08-12: 10 mg via RECTAL
  Filled 2015-08-08: qty 1

## 2015-08-08 MED ORDER — ASPIRIN 81 MG PO CHEW
81.0000 mg | CHEWABLE_TABLET | Freq: Every day | ORAL | Status: DC
  Administered 2015-08-09 – 2015-08-12 (×4): 81 mg via ORAL
  Filled 2015-08-08 (×4): qty 1

## 2015-08-08 MED ORDER — FENTANYL CITRATE (PF) 100 MCG/2ML IJ SOLN
INTRAMUSCULAR | Status: DC | PRN
  Administered 2015-08-08: 50 ug via INTRAVENOUS
  Administered 2015-08-08: 100 ug via INTRAVENOUS
  Administered 2015-08-08 (×4): 50 ug via INTRAVENOUS
  Administered 2015-08-08: 100 ug via INTRAVENOUS
  Administered 2015-08-08: 50 ug via INTRAVENOUS

## 2015-08-08 MED ORDER — FENTANYL CITRATE (PF) 100 MCG/2ML IJ SOLN
INTRAMUSCULAR | Status: AC
  Filled 2015-08-08: qty 2

## 2015-08-08 MED ORDER — SODIUM CHLORIDE 0.9 % IV SOLN
INTRAVENOUS | Status: DC | PRN
  Administered 2015-08-08: 16:00:00 via INTRAVENOUS

## 2015-08-08 MED ORDER — SENNOSIDES-DOCUSATE SODIUM 8.6-50 MG PO TABS: 1.0000 | ORAL_TABLET | Freq: Every evening | ORAL | Status: DC | PRN

## 2015-08-08 MED ORDER — FENTANYL CITRATE (PF) 100 MCG/2ML IJ SOLN
25.0000 ug | INTRAMUSCULAR | Status: DC | PRN
  Administered 2015-08-08: 25 ug via INTRAVENOUS

## 2015-08-08 MED ORDER — SODIUM CHLORIDE 0.9% FLUSH: 3.0000 mL | INTRAVENOUS | Status: DC | PRN

## 2015-08-08 MED ORDER — 0.9 % SODIUM CHLORIDE (POUR BTL) OPTIME
TOPICAL | Status: DC | PRN
  Administered 2015-08-08: 1000 mL

## 2015-08-08 MED ORDER — ROCURONIUM BROMIDE 50 MG/5ML IV SOLN
INTRAVENOUS | Status: AC
  Filled 2015-08-08: qty 2

## 2015-08-08 MED ORDER — METHOCARBAMOL 1000 MG/10ML IJ SOLN
500.0000 mg | Freq: Four times a day (QID) | INTRAVENOUS | Status: DC | PRN
  Filled 2015-08-08: qty 5

## 2015-08-08 MED ORDER — ACETAMINOPHEN 325 MG PO TABS
650.0000 mg | ORAL_TABLET | ORAL | Status: DC | PRN
  Administered 2015-08-09: 650 mg via ORAL
  Filled 2015-08-08: qty 2

## 2015-08-08 MED ORDER — OXYCODONE-ACETAMINOPHEN 5-325 MG PO TABS
1.0000 | ORAL_TABLET | ORAL | Status: DC | PRN
  Administered 2015-08-09 (×3): 1 via ORAL
  Administered 2015-08-10 – 2015-08-12 (×4): 2 via ORAL
  Filled 2015-08-08: qty 2
  Filled 2015-08-08: qty 1
  Filled 2015-08-08 (×2): qty 2
  Filled 2015-08-08 (×2): qty 1
  Filled 2015-08-08: qty 2
  Filled 2015-08-08 (×2): qty 1

## 2015-08-08 MED ORDER — ONE-DAILY MULTI VITAMINS PO TABS: 1.0000 | ORAL_TABLET | Freq: Every day | ORAL | Status: DC

## 2015-08-08 MED ORDER — FENTANYL CITRATE (PF) 250 MCG/5ML IJ SOLN
INTRAMUSCULAR | Status: AC
  Filled 2015-08-08: qty 5

## 2015-08-08 MED ORDER — LEVOTHYROXINE SODIUM 25 MCG PO TABS
125.0000 ug | ORAL_TABLET | Freq: Every day | ORAL | Status: DC
  Administered 2015-08-09 – 2015-08-12 (×4): 125 ug via ORAL
  Filled 2015-08-08 (×4): qty 1

## 2015-08-08 MED ORDER — LIDOCAINE-EPINEPHRINE 1 %-1:100000 IJ SOLN
INTRAMUSCULAR | Status: DC | PRN
  Administered 2015-08-08: 10 mL

## 2015-08-08 MED ORDER — LIDOCAINE HCL (CARDIAC) 20 MG/ML IV SOLN
INTRAVENOUS | Status: DC | PRN
  Administered 2015-08-08: 60 mg via INTRAVENOUS

## 2015-08-08 MED ORDER — ALUM & MAG HYDROXIDE-SIMETH 200-200-20 MG/5ML PO SUSP: 30.0000 mL | Freq: Four times a day (QID) | ORAL | Status: DC | PRN

## 2015-08-08 MED ORDER — LIDOCAINE 2% (20 MG/ML) 5 ML SYRINGE
INTRAMUSCULAR | Status: AC
  Filled 2015-08-08: qty 5

## 2015-08-08 MED ORDER — ONDANSETRON HCL 4 MG/2ML IJ SOLN
INTRAMUSCULAR | Status: AC
  Filled 2015-08-08: qty 2

## 2015-08-08 MED ORDER — FAMOTIDINE 10 MG PO TABS
10.0000 mg | ORAL_TABLET | Freq: Two times a day (BID) | ORAL | Status: DC
  Administered 2015-08-08 – 2015-08-12 (×8): 10 mg via ORAL
  Filled 2015-08-08 (×8): qty 1

## 2015-08-08 MED ORDER — SODIUM CHLORIDE 0.9% FLUSH
3.0000 mL | Freq: Two times a day (BID) | INTRAVENOUS | Status: DC
  Administered 2015-08-08 – 2015-08-12 (×6): 3 mL via INTRAVENOUS

## 2015-08-08 MED ORDER — HYDROCODONE-ACETAMINOPHEN 5-325 MG PO TABS
1.0000 | ORAL_TABLET | ORAL | Status: DC | PRN
  Administered 2015-08-10: 1 via ORAL
  Administered 2015-08-11: 2 via ORAL
  Filled 2015-08-08: qty 2

## 2015-08-08 MED ORDER — HYDROMORPHONE HCL 1 MG/ML IJ SOLN
0.5000 mg | INTRAMUSCULAR | Status: DC | PRN
  Administered 2015-08-08 – 2015-08-10 (×6): 1 mg via INTRAVENOUS
  Filled 2015-08-08 (×6): qty 1

## 2015-08-08 MED ORDER — TAMSULOSIN HCL 0.4 MG PO CAPS
0.4000 mg | ORAL_CAPSULE | Freq: Every day | ORAL | Status: DC
  Administered 2015-08-09 – 2015-08-11 (×3): 0.4 mg via ORAL
  Filled 2015-08-08 (×4): qty 1

## 2015-08-08 MED ORDER — ACETAMINOPHEN 500 MG PO TABS: 500.0000 mg | ORAL_TABLET | Freq: Every day | ORAL | Status: DC | PRN

## 2015-08-08 MED ORDER — GLYCOPYRROLATE 0.2 MG/ML IV SOSY
PREFILLED_SYRINGE | INTRAVENOUS | Status: AC
  Filled 2015-08-08: qty 3

## 2015-08-08 MED ORDER — ONDANSETRON HCL 4 MG/2ML IJ SOLN
INTRAMUSCULAR | Status: DC | PRN
  Administered 2015-08-08: 4 mg via INTRAVENOUS

## 2015-08-08 MED ORDER — ASPIRIN 81 MG PO TABS: 81.0000 mg | ORAL_TABLET | Freq: Every day | ORAL | Status: DC

## 2015-08-08 MED ORDER — EPHEDRINE SULFATE 50 MG/ML IJ SOLN
INTRAMUSCULAR | Status: DC | PRN
  Administered 2015-08-08: 40 mg via INTRAVENOUS
  Administered 2015-08-08: 15 mg via INTRAVENOUS

## 2015-08-08 MED ORDER — NEOSTIGMINE METHYLSULFATE 10 MG/10ML IV SOLN
INTRAVENOUS | Status: DC | PRN
  Administered 2015-08-08: 3 mg via INTRAVENOUS

## 2015-08-08 MED ORDER — DOCUSATE SODIUM 100 MG PO CAPS
100.0000 mg | ORAL_CAPSULE | Freq: Two times a day (BID) | ORAL | Status: DC
Start: 2015-08-08 — End: 2015-08-12
  Administered 2015-08-08 – 2015-08-12 (×8): 100 mg via ORAL
  Filled 2015-08-08 (×8): qty 1

## 2015-08-08 MED ORDER — PROPOFOL 10 MG/ML IV BOLUS
INTRAVENOUS | Status: AC
  Filled 2015-08-08: qty 20

## 2015-08-08 MED ORDER — ROSUVASTATIN CALCIUM 10 MG PO TABS
10.0000 mg | ORAL_TABLET | Freq: Every day | ORAL | Status: DC
  Administered 2015-08-08 – 2015-08-11 (×4): 10 mg via ORAL
  Filled 2015-08-08 (×5): qty 1

## 2015-08-08 MED ORDER — LACTATED RINGERS IV SOLN
INTRAVENOUS | Status: DC
  Administered 2015-08-08 (×2): via INTRAVENOUS

## 2015-08-08 MED ORDER — PANTOPRAZOLE SODIUM 40 MG IV SOLR
40.0000 mg | Freq: Every day | INTRAVENOUS | Status: DC
  Administered 2015-08-08: 40 mg via INTRAVENOUS
  Filled 2015-08-08: qty 40

## 2015-08-08 MED ORDER — METHOCARBAMOL 500 MG PO TABS
500.0000 mg | ORAL_TABLET | Freq: Four times a day (QID) | ORAL | Status: DC | PRN
  Administered 2015-08-10: 500 mg via ORAL
  Filled 2015-08-08: qty 1

## 2015-08-08 MED ORDER — AMLODIPINE BESYLATE 5 MG PO TABS
5.0000 mg | ORAL_TABLET | Freq: Every day | ORAL | Status: DC
  Administered 2015-08-09 – 2015-08-11 (×3): 5 mg via ORAL
  Filled 2015-08-08 (×4): qty 1

## 2015-08-08 MED ORDER — DEXTROSE 5 % IV SOLN
10.0000 mg | INTRAVENOUS | Status: DC | PRN
  Administered 2015-08-08: 20 ug/min via INTRAVENOUS
  Administered 2015-08-08 (×2): 30 ug/min via INTRAVENOUS

## 2015-08-08 MED ORDER — ONDANSETRON HCL 4 MG/2ML IJ SOLN
4.0000 mg | INTRAMUSCULAR | Status: DC | PRN
Start: 2015-08-08 — End: 2015-08-12
  Administered 2015-08-08: 4 mg via INTRAVENOUS
  Filled 2015-08-08: qty 2

## 2015-08-08 MED ORDER — ZOLPIDEM TARTRATE 5 MG PO TABS: 5.0000 mg | ORAL_TABLET | Freq: Every evening | ORAL | Status: DC | PRN

## 2015-08-08 MED ORDER — SODIUM CHLORIDE 0.9 % IV SOLN: 250.0000 mL | INTRAVENOUS | Status: DC

## 2015-08-08 MED ORDER — FLEET ENEMA 7-19 GM/118ML RE ENEM: 1.0000 | ENEMA | Freq: Once | RECTAL | Status: DC | PRN

## 2015-08-08 MED ORDER — PHENYLEPHRINE HCL 10 MG/ML IJ SOLN
INTRAMUSCULAR | Status: AC
  Filled 2015-08-08: qty 1

## 2015-08-08 MED ORDER — CEFAZOLIN SODIUM-DEXTROSE 2-4 GM/100ML-% IV SOLN
2.0000 g | Freq: Three times a day (TID) | INTRAVENOUS | Status: AC
  Administered 2015-08-08 – 2015-08-09 (×2): 2 g via INTRAVENOUS
  Filled 2015-08-08 (×2): qty 100

## 2015-08-08 MED ORDER — PROPOFOL 10 MG/ML IV BOLUS
INTRAVENOUS | Status: DC | PRN
  Administered 2015-08-08: 150 mg via INTRAVENOUS
  Administered 2015-08-08: 50 mg via INTRAVENOUS

## 2015-08-08 MED ORDER — PHENOL 1.4 % MT LIQD: 1.0000 | OROMUCOSAL | Status: DC | PRN

## 2015-08-08 MED ORDER — FUROSEMIDE 40 MG PO TABS
40.0000 mg | ORAL_TABLET | Freq: Every day | ORAL | Status: DC
  Administered 2015-08-09 – 2015-08-12 (×4): 40 mg via ORAL
  Filled 2015-08-08 (×5): qty 1

## 2015-08-08 MED ORDER — ACETAMINOPHEN 650 MG RE SUPP: 650.0000 mg | RECTAL | Status: DC | PRN

## 2015-08-08 MED ORDER — RIVAROXABAN 20 MG PO TABS
20.0000 mg | ORAL_TABLET | Freq: Every day | ORAL | Status: DC
  Administered 2015-08-09 – 2015-08-11 (×3): 20 mg via ORAL
  Filled 2015-08-08 (×3): qty 1

## 2015-08-08 MED ORDER — SUCCINYLCHOLINE 20MG/ML (10ML) SYRINGE FOR MEDFUSION PUMP - OPTIME
INTRAMUSCULAR | Status: DC | PRN
  Administered 2015-08-08: 120 mg via INTRAVENOUS

## 2015-08-08 MED ORDER — EPHEDRINE 5 MG/ML INJ
INTRAVENOUS | Status: AC
  Filled 2015-08-08: qty 10

## 2015-08-08 MED ORDER — BUPIVACAINE LIPOSOME 1.3 % IJ SUSP
20.0000 mL | Freq: Once | INTRAMUSCULAR | Status: DC
  Filled 2015-08-08: qty 20

## 2015-08-08 MED ORDER — NEOSTIGMINE METHYLSULFATE 5 MG/5ML IV SOSY
PREFILLED_SYRINGE | INTRAVENOUS | Status: AC
  Filled 2015-08-08: qty 5

## 2015-08-08 MED ORDER — MENTHOL 3 MG MT LOZG: 1.0000 | LOZENGE | OROMUCOSAL | Status: DC | PRN

## 2015-08-08 MED ORDER — ADULT MULTIVITAMIN W/MINERALS CH
1.0000 | ORAL_TABLET | Freq: Every day | ORAL | Status: DC
  Administered 2015-08-09 – 2015-08-12 (×4): 1 via ORAL
  Filled 2015-08-08 (×5): qty 1

## 2015-08-08 MED ORDER — BUPIVACAINE LIPOSOME 1.3 % IJ SUSP
INTRAMUSCULAR | Status: DC | PRN
  Administered 2015-08-08: 20 mL

## 2015-08-08 SURGICAL SUPPLY — 96 items
ADH SKN CLS APL DERMABOND .7 (GAUZE/BANDAGES/DRESSINGS) ×1
APL SKNCLS STERI-STRIP NONHPOA (GAUZE/BANDAGES/DRESSINGS)
BENZOIN TINCTURE PRP APPL 2/3 (GAUZE/BANDAGES/DRESSINGS) IMPLANT
BIT DRILL PLIF MAS DISP 5.5MM (DRILL) IMPLANT
BLADE CLIPPER SURG (BLADE) IMPLANT
BONE CANC CHIPS 20CC PCAN1/4 (Bone Implant) ×2 IMPLANT
BUR MATCHSTICK NEURO 3.0 LAGG (BURR) ×2 IMPLANT
BUR PRECISION FLUTE 5.0 (BURR) IMPLANT
BUR ROUND FLUTED 5 RND (BURR) ×2 IMPLANT
CAGE COROENT 10X9X23 (Cage) ×2 IMPLANT
CAGE COROENT MP 12X9X23-4 (Cage) ×2 IMPLANT
CANISTER SUCT 3000ML PPV (MISCELLANEOUS) ×2 IMPLANT
CHIPS CANC BONE 20CC PCAN1/4 (Bone Implant) ×1 IMPLANT
CLIP NEUROVISION LG (CLIP) ×1 IMPLANT
CONT SPEC 4OZ CLIKSEAL STRL BL (MISCELLANEOUS) ×2 IMPLANT
COVER BACK TABLE 24X17X13 BIG (DRAPES) IMPLANT
COVER BACK TABLE 60X90IN (DRAPES) ×2 IMPLANT
DECANTER SPIKE VIAL GLASS SM (MISCELLANEOUS) ×2 IMPLANT
DERMABOND ADVANCED (GAUZE/BANDAGES/DRESSINGS) ×1
DERMABOND ADVANCED .7 DNX12 (GAUZE/BANDAGES/DRESSINGS) ×1 IMPLANT
DRAPE C-ARM 42X72 X-RAY (DRAPES) ×2 IMPLANT
DRAPE C-ARMOR (DRAPES) ×2 IMPLANT
DRAPE LAPAROTOMY 100X72X124 (DRAPES) ×2 IMPLANT
DRAPE POUCH INSTRU U-SHP 10X18 (DRAPES) ×2 IMPLANT
DRAPE SURG 17X23 STRL (DRAPES) ×2 IMPLANT
DRILL PLIF MAS DISP 5.5MM (DRILL) ×2
DRSG OPSITE POSTOP 4X8 (GAUZE/BANDAGES/DRESSINGS) ×1 IMPLANT
DURAPREP 26ML APPLICATOR (WOUND CARE) ×2 IMPLANT
ELECT BLADE 4.0 EZ CLEAN MEGAD (MISCELLANEOUS) ×2
ELECT REM PT RETURN 9FT ADLT (ELECTROSURGICAL) ×2
ELECTRODE BLDE 4.0 EZ CLN MEGD (MISCELLANEOUS) IMPLANT
ELECTRODE REM PT RTRN 9FT ADLT (ELECTROSURGICAL) ×1 IMPLANT
EVACUATOR 1/8 PVC DRAIN (DRAIN) IMPLANT
GAUZE SPONGE 4X4 12PLY STRL (GAUZE/BANDAGES/DRESSINGS) ×2 IMPLANT
GAUZE SPONGE 4X4 16PLY XRAY LF (GAUZE/BANDAGES/DRESSINGS) IMPLANT
GLOVE BIO SURGEON STRL SZ8 (GLOVE) ×5 IMPLANT
GLOVE BIO SURGEON STRL SZ8.5 (GLOVE) ×1 IMPLANT
GLOVE BIOGEL PI IND STRL 8 (GLOVE) ×2 IMPLANT
GLOVE BIOGEL PI IND STRL 8.5 (GLOVE) ×2 IMPLANT
GLOVE BIOGEL PI INDICATOR 8 (GLOVE) ×2
GLOVE BIOGEL PI INDICATOR 8.5 (GLOVE) ×2
GLOVE ECLIPSE 8.0 STRL XLNG CF (GLOVE) ×4 IMPLANT
GLOVE EXAM NITRILE LRG STRL (GLOVE) IMPLANT
GLOVE EXAM NITRILE MD LF STRL (GLOVE) IMPLANT
GLOVE EXAM NITRILE XL STR (GLOVE) IMPLANT
GLOVE EXAM NITRILE XS STR PU (GLOVE) IMPLANT
GLOVE INDICATOR 7.0 STRL GRN (GLOVE) ×3 IMPLANT
GLOVE INDICATOR 7.5 STRL GRN (GLOVE) ×3 IMPLANT
GOWN STRL REUS W/ TWL LRG LVL3 (GOWN DISPOSABLE) IMPLANT
GOWN STRL REUS W/ TWL XL LVL3 (GOWN DISPOSABLE) ×3 IMPLANT
GOWN STRL REUS W/TWL 2XL LVL3 (GOWN DISPOSABLE) ×2 IMPLANT
GOWN STRL REUS W/TWL LRG LVL3 (GOWN DISPOSABLE) ×2
GOWN STRL REUS W/TWL XL LVL3 (GOWN DISPOSABLE) ×6
GRAFT BNE CANC CHIPS 1-8 20CC (Bone Implant) IMPLANT
KIT BASIN OR (CUSTOM PROCEDURE TRAY) ×2 IMPLANT
KIT POSITION SURG JACKSON T1 (MISCELLANEOUS) ×2 IMPLANT
KIT ROOM TURNOVER OR (KITS) ×2 IMPLANT
MILL MEDIUM DISP (BLADE) ×2 IMPLANT
MODULE NVM5 NEXT GEN EMG (NEEDLE) ×1 IMPLANT
NDL HYPO 21X1.5 SAFETY (NEEDLE) IMPLANT
NDL HYPO 25X1 1.5 SAFETY (NEEDLE) ×1 IMPLANT
NDL SPNL 18GX3.5 QUINCKE PK (NEEDLE) IMPLANT
NEEDLE HYPO 21X1.5 SAFETY (NEEDLE) ×2 IMPLANT
NEEDLE HYPO 25X1 1.5 SAFETY (NEEDLE) ×2 IMPLANT
NEEDLE SPNL 18GX3.5 QUINCKE PK (NEEDLE) ×2 IMPLANT
NS IRRIG 1000ML POUR BTL (IV SOLUTION) ×2 IMPLANT
PACK LAMINECTOMY NEURO (CUSTOM PROCEDURE TRAY) ×2 IMPLANT
PAD ARMBOARD 7.5X6 YLW CONV (MISCELLANEOUS) ×6 IMPLANT
PATTIES SURGICAL .5 X.5 (GAUZE/BANDAGES/DRESSINGS) IMPLANT
PATTIES SURGICAL .5 X1 (DISPOSABLE) IMPLANT
PATTIES SURGICAL 1X1 (DISPOSABLE) IMPLANT
ROD 55MM (Rod) ×4 IMPLANT
ROD SPNL 55XPREBNT NS MAS (Rod) IMPLANT
SCREW LOCK (Screw) ×12 IMPLANT
SCREW LOCK FXNS SPNE MAS PL (Screw) IMPLANT
SCREW SHANK 5.5X40MM (Screw) ×2 IMPLANT
SCREW SHANK 6.5X40 (Screw) ×4 IMPLANT
SCREW SHANK 6.5X40 NS LF (Screw) IMPLANT
SCREW SHANK PLIF MAS 5.5X40 (Screw) ×2 IMPLANT
SCREW TULIP 5.5 (Screw) ×4 IMPLANT
SPONGE LAP 4X18 X RAY DECT (DISPOSABLE) IMPLANT
SPONGE SURGIFOAM ABS GEL 100 (HEMOSTASIS) ×2 IMPLANT
STAPLER SKIN PROX WIDE 3.9 (STAPLE) IMPLANT
STRIP CLOSURE SKIN 1/2X4 (GAUZE/BANDAGES/DRESSINGS) ×2 IMPLANT
SUT VIC AB 1 CT1 18XBRD ANBCTR (SUTURE) ×2 IMPLANT
SUT VIC AB 1 CT1 8-18 (SUTURE) ×4
SUT VIC AB 2-0 CT1 18 (SUTURE) ×4 IMPLANT
SUT VIC AB 3-0 SH 8-18 (SUTURE) ×4 IMPLANT
SYR 20CC LL (SYRINGE) ×1 IMPLANT
SYR 3ML LL SCALE MARK (SYRINGE) ×8 IMPLANT
SYR 5ML LL (SYRINGE) IMPLANT
TOWEL OR 17X24 6PK STRL BLUE (TOWEL DISPOSABLE) ×2 IMPLANT
TOWEL OR 17X26 10 PK STRL BLUE (TOWEL DISPOSABLE) ×2 IMPLANT
TRAP SPECIMEN MUCOUS 40CC (MISCELLANEOUS) ×2 IMPLANT
TRAY FOLEY W/METER SILVER 14FR (SET/KITS/TRAYS/PACK) ×1 IMPLANT
WATER STERILE IRR 1000ML POUR (IV SOLUTION) ×2 IMPLANT

## 2015-08-08 NOTE — Brief Op Note (Signed)
08/08/2015  4:55 PM  PATIENT:  Leonard Back Dubreuil Sr.  82 y.o. male  PRE-OPERATIVE DIAGNOSIS:  Spondylolisthesis L 45, Herniated lumbar disc, stenosis, lumbago, radiculopathy L 45, L 5 S 1 levels  POST-OPERATIVE DIAGNOSIS: Spondylolisthesis L 45, Herniated lumbar disc, stenosis, lumbago, radiculopathy L 45, L 5 S 1 levels  PROCEDURE:  Procedure(s) with comments: Lumbar Four-Five, Lumbar Five-Sacral One Maximum access posterior lumbar interbody fusion (N/A) - LUMBAR FOUR-FIVE ,LUMBAR FIVE -SACRAL Maximum access posterior lumbar interbody fusion with discectomy, PEEK cages, pedicle screw fixation, posterolateral arthrodesis L 45, L 5 S 1 levels   Decompression greater than for standard PLIF procedure  SURGEON:  Surgeon(s) and Role:    * [name], MD - Primary    * Newman Pies, MD - Assisting  PHYSICIAN ASSISTANT:   ASSISTANTS: Poteat, RN   ANESTHESIA:   general  EBL:  Total I/O In: 2015 [I.V.:1900; Blood:115] Out: 950 [Urine:500; Blood:450]  BLOOD ADMINISTERED:(per anesthesia record) CC CELLSAVER  DRAINS: (Medium) Hemovact drain(s) in the epidural space with  Suction Open   LOCAL MEDICATIONS USED:  MARCAINE    and LIDOCAINE   SPECIMEN:  No Specimen  DISPOSITION OF SPECIMEN:  N/A  COUNTS:  YES  TOURNIQUET:  * No tourniquets in log *  DICTATION: DICTATION: Patient is a 82 year old with spondylolisthesis L 45 , stenosis, disc herniation L 5 S 1 right and severe back and bilateral lower extremity pain at L4/5 and L 5 S 1 levels of the lumbar spine.  It was elected to take him to surgery for MASPLIF at  L 45 and L 5 S 1  levels with posterolateral arthrodesis.  Procedure:   Following uncomplicated induction of GETA, and placement of electrodes for neural monitoring, patient was turned into a prone position on the [place] tableand using AP  fluoroscopy the area of planned incision was marked, prepped with betadine scrub and Duraprep, then draped. Exposure was performed  of facet joint complex at L 45 and L 5 S 1 levels and the MAS retractor was placed.5.5 x 40 mm cortical Nuvasive screws were placed at L 4 bilaterally according to standard landmarks using neural monitoring.  A total laminectomy of L 4 and L 5 was then performed with disarticulation of facets.  Decompression was greater than for standard PLIF procedure and thorough decompression of the thecal sac, bilateral L 4, L 5, S1 nerve roots was performed. Along with foraminal and extraforaminal portions of these nerve roots.  This bone was saved for grafting, combined with allograft after being run through bone mill and was placed in bone packing device.  Thorough discectomy was performed bilaterally at  L 5 S 1  With removal of a large disc herniation which was causing significant nerve root compression on the right and the endplates were prepared for grafting.  23 x 10 x 4 degree cages were placed in the interspace and positioning was confirmed with AP and lateral fluoroscopy.  10 cc of autograft/allograft was packed in the interspace medial to the second cage.   Thorough discectomy was performed bilaterally at L 45  and the endplates were prepared for grafting.  23 x 12 x 4 degree cages were placed in the interspace and positioning was confirmed with AP and lateral fluoroscopy.  10 cc of autograft/allograft was packed in the interspace medial to the second cage.   Remaining screws were placed at L 5 and and S 1 and 55 mm rods were placed. And the screws were locked and  torqued.Final Xrays showed well positioned implants and screw fixation. The posterolateral region on the left was packed with remaining 20 cc of autograft on the right and 10 cc on the left. A medium Hemovac drain was placed through a separate stab incision. The wounds were irrigated and then closed with 1, 2-0 and 3-0 Vicryl stitches. 20 cc long-acting Marcaine was injected into the musculature.  Sterile occlusive dressing was placed with Dermabond and an  occlusive dressing. The patient was then extubated in the operating room and taken to recovery in stable and satisfactory condition having tolerated her operation well. Counts were correct at the end of the case.  PLAN OF CARE: Admit to inpatient   PATIENT DISPOSITION:  PACU - hemodynamically stable.   Delay start of Pharmacological VTE agent (>24hrs) due to surgical blood loss or risk of bleeding: yes

## 2015-08-08 NOTE — Anesthesia Procedure Notes (Signed)
Procedure Name: Intubation Date/Time: 08/08/2015 12:41 PM Performed by: Lance Coon Pre-anesthesia Checklist: Patient identified, Timeout performed, Emergency Drugs available, Suction available and Patient being monitored Patient Re-evaluated:Patient Re-evaluated prior to inductionOxygen Delivery Method: Circle system utilized Preoxygenation: Pre-oxygenation with 100% oxygen Intubation Type: IV induction Laryngoscope Size: Miller and 3 Grade View: Grade I Tube type: Oral Tube size: 7.5 mm Number of attempts: 1 Airway Equipment and Method: Stylet Placement Confirmation: ETT inserted through vocal cords under direct vision,  positive ETCO2 and breath sounds checked- equal and bilateral Secured at: 23 cm Tube secured with: Tape Dental Injury: Teeth and Oropharynx as per pre-operative assessment

## 2015-08-08 NOTE — Progress Notes (Signed)
Patient admitted from PACU. Patient alert and oriented x 4. Patient made comfortable at this time. Will continue to monitor.

## 2015-08-08 NOTE — Op Note (Signed)
08/08/2015  4:55 PM  Berg:  Leonard Back Fetterman Sr.  82 y.o. male  PRE-OPERATIVE DIAGNOSIS:  Spondylolisthesis L 45, Herniated lumbar disc, stenosis, lumbago, radiculopathy L 45, L 5 S 1 levels  POST-OPERATIVE DIAGNOSIS: Spondylolisthesis L 45, Herniated lumbar disc, stenosis, lumbago, radiculopathy L 45, L 5 S 1 levels  PROCEDURE:  Procedure(s) with comments: Lumbar Four-Five, Lumbar Five-Sacral One Maximum access posterior lumbar interbody fusion (N/A) - LUMBAR FOUR-FIVE ,LUMBAR FIVE -SACRAL Maximum access posterior lumbar interbody fusion with discectomy, PEEK cages, pedicle screw fixation, posterolateral arthrodesis L 45, L 5 S 1 levels   Decompression greater than for standard PLIF procedure  SURGEON:  Surgeon(s) and Role:    * [name], MD - Primary    * Newman Pies, MD - Assisting  PHYSICIAN ASSISTANT:   ASSISTANTS: Poteat, RN   ANESTHESIA:   general  EBL:  Total I/O In: 2015 [I.V.:1900; Blood:115] Out: 950 [Urine:500; Blood:450]  BLOOD ADMINISTERED:(per anesthesia record) CC CELLSAVER  DRAINS: (Medium) Hemovact drain(s) in the epidural space with  Suction Open   LOCAL MEDICATIONS USED:  MARCAINE    and LIDOCAINE   SPECIMEN:  No Specimen  DISPOSITION OF SPECIMEN:  N/A  COUNTS:  YES  TOURNIQUET:  * No tourniquets in log *  DICTATION: DICTATION: Berg is a Leonard year old with spondylolisthesis L 45 , stenosis, disc herniation L 5 S 1 right and severe back and bilateral lower extremity pain at L4/5 and L 5 S 1 levels of the lumbar spine.  It was elected to take him to surgery for MASPLIF at  L 45 and L 5 S 1  levels with posterolateral arthrodesis.  Procedure:   Following uncomplicated induction of GETA, and placement of electrodes for neural monitoring, Berg was turned into a prone position on the [place] tableand using AP  fluoroscopy the area of planned incision was marked, prepped with betadine scrub and Duraprep, then draped. Exposure was performed  of facet joint complex at L 45 and L 5 S 1 levels and the MAS retractor was placed.5.5 x 40 mm cortical Nuvasive screws were placed at L 4 bilaterally according to standard landmarks using neural monitoring.  A total laminectomy of L 4 and L 5 was then performed with disarticulation of facets.  Decompression was greater than for standard PLIF procedure and thorough decompression of the thecal sac, bilateral L 4, L 5, S1 nerve roots was performed. Along with foraminal and extraforaminal portions of these nerve roots.  This bone was saved for grafting, combined with allograft after being run through bone mill and was placed in bone packing device.  Thorough discectomy was performed bilaterally at  L 5 S 1  With removal of a large disc herniation which was causing significant nerve root compression on the right and the endplates were prepared for grafting.  23 x 10 x 4 degree cages were placed in the interspace and positioning was confirmed with AP and lateral fluoroscopy.  10 cc of autograft/allograft was packed in the interspace medial to the second cage.   Thorough discectomy was performed bilaterally at L 45  and the endplates were prepared for grafting.  23 x 12 x 4 degree cages were placed in the interspace and positioning was confirmed with AP and lateral fluoroscopy.  10 cc of autograft/allograft was packed in the interspace medial to the second cage.   Remaining screws were placed at L 5 and and S 1 and 55 mm rods were placed. And the screws were locked and  torqued.Final Xrays showed well positioned implants and screw fixation. The posterolateral region on the left was packed with remaining 20 cc of autograft on the right and 10 cc on the left. A medium Hemovac drain was placed through a separate stab incision. The wounds were irrigated and then closed with 1, 2-0 and 3-0 Vicryl stitches. 20 cc long-acting Marcaine was injected into the musculature.  Sterile occlusive dressing was placed with Dermabond and an  occlusive dressing. The Berg was then extubated in the operating room and taken to recovery in stable and satisfactory condition having tolerated her operation well. Counts were correct at the end of the case.  PLAN OF CARE: Admit to inpatient   Berg DISPOSITION:  PACU - hemodynamically stable.   Delay start of Pharmacological VTE agent (>24hrs) due to surgical blood loss or risk of bleeding: yes

## 2015-08-08 NOTE — Anesthesia Preprocedure Evaluation (Signed)
Anesthesia Evaluation  Patient identified by MRN, date of birth, ID band  Reviewed: Allergy & Precautions, NPO status , Patient's Chart, lab work & pertinent test results  Airway Mallampati: II  TM Distance: >3 FB Neck ROM: Full    Dental   Pulmonary former smoker,     (-) decreased breath sounds+ wheezing      Cardiovascular hypertension, + angina + CAD, + Past MI and + Peripheral Vascular Disease  + dysrhythmias  Rhythm:Regular Rate:Normal     Neuro/Psych    GI/Hepatic GERD  ,  Endo/Other  Hypothyroidism   Renal/GU Renal disease     Musculoskeletal   Abdominal   Peds  Hematology   Anesthesia Other Findings   Reproductive/Obstetrics                             Anesthesia Physical Anesthesia Plan  ASA: III  Anesthesia Plan: General   Post-op Pain Management:    Induction: Intravenous  Airway Management Planned: Oral ETT  Additional Equipment:   Intra-op Plan:   Post-operative Plan: Possible Post-op intubation/ventilation  Informed Consent: I have reviewed the patients History and Physical, chart, labs and discussed the procedure including the risks, benefits and alternatives for the proposed anesthesia with the patient or authorized representative who has indicated his/her understanding and acceptance.   Dental advisory given  Plan Discussed with: CRNA and Anesthesiologist  Anesthesia Plan Comments:         Anesthesia Quick Evaluation

## 2015-08-08 NOTE — Transfer of Care (Signed)
Immediate Anesthesia Transfer of Care Note  Patient: Leonard [name]  Procedure(s) Performed: Procedure(s) with comments: Lumbar Four-Five, Lumbar Five-Sacral One Maximum access posterior lumbar interbody fusion (N/A) - LUMBAR FOUR-FIVE ,LUMBAR FIVE -SACRAL Maximum access posterior lumbar interbody fusion  Patient Location: PACU  Anesthesia Type:General  Level of Consciousness: awake, alert  and oriented  Airway & Oxygen Therapy: Patient Spontanous Breathing and Patient connected to nasal cannula oxygen  Post-op Assessment: Report given to RN, Post -op Vital signs reviewed and stable and Patient moving all extremities X 4  Post vital signs: Reviewed and stable  Last Vitals:  Filed Vitals:   08/08/15 1007  BP: 150/70  Pulse: 76  Temp: 36.8 C  Resp: 20    Last Pain:  Filed Vitals:   08/08/15 1017  PainSc: 3       Patients Stated Pain Goal: 3 ([id number])  Complications: No apparent anesthesia complications

## 2015-08-08 NOTE — Interval H&P Note (Signed)
History and Physical Interval Note:  08/08/2015 9:19 AM  Leonard Back Aydin Sr.  has presented today for surgery, with the diagnosis of Spondylolisthesis, Lumbar region  The various methods of treatment have been discussed with the patient and family. After consideration of risks, benefits and other options for treatment, the patient has consented to  Procedure(s) with comments: L4-5 L5-S1 Maximum access posterior lumbar interbody fusion (N/A) - L4-5 L5-S1 Maximum access posterior lumbar interbody fusion as a surgical intervention .  The patient's history has been reviewed, patient examined, no change in status, stable for surgery.  I have reviewed the patient's chart and labs.  Questions were answered to the patient's satisfaction.     Leonard Berg

## 2015-08-08 NOTE — Progress Notes (Signed)
Awake, alert, moderate discomfort.  MAEW.  Doing well.

## 2015-08-08 NOTE — H&P (Signed)
Patient ID:   [id number] Patient: Leonard Berg  Date of Birth: 09/07/1929 Visit Type: Office Visit   Date: 06/14/2015 12:00 PM Provider: Marchia Meiers. Vertell Limber MD   This 82 year old male presents for back pain.  History of Present Illness: 1.  back pain  The patient returns today.  He says he is still having a great deal of discomfort with walking and describes that he is not able to walk for any length of time and that his pain goes up to 10 out of 10 in severity.  His buttocks radiating down to the level of his knees.  He says it is both legs rather than more on the right.  I reviewed his MRI of his lumbar spine along with plain radiographs.  He was mobile spondylolisthesis of L4 on L5 ranging from 2.7 mm and extension to 5.5 mm in flexion with 5 mm neutral lateral radiograph in addition he has significant stenosis at this level.  He has a persistent right-sided disc herniation at L5 S1 on the right which is causing right S1 nerve root compression.  He has some lateral recess stenosis and disc bulges at L2-3 and L3 L4 levels which do not seem to be nearly as significant as the 2 lowest levels in his low back.  I expressed concern about the idea of going ahead with surgery given his history of pulmonary embolus but he states that Dr. Burt Knack has indicated that he would be all right for him to be off Xeralto around surgery and the patient states that he is unable to stand his current level of pain.  He continues to have bilateral sciatica on examination and my recommendation based on his new imaging would be for him to undergo decompression and fusion at the L4 through sacral levels.  This would consist of MAS PLIF at the L4 L5 and L5-S1 levels.  Risks and benefits were discussed in detail with the patient.  I recommended that he be cleared for surgery by Dr. Burt Knack.  We went over the specifics of the surgery in great detail and went over models and discussed his imaging and his pain complaints and  reasonable expectations surrounding surgery.  I do not think anything needs to be done at the L2 through L3 4 levels, nor do I think that these levels are contributing to his pain complaints.  He is exhausted the benefits of injections and did not get any sustained relief with physical therapy.  He has spondylolisthesis of L4-L5 with stenosis along with disc degeneration and a herniated disc at the L5-S1 level.  His back bothers him more than his legs but both legs are extremely painful and are limiting his ability to function.      Medical/Surgical/Interim History Reviewed, no change.  Last detailed document date:12/27/2013.   PAST MEDICAL HISTORY, SURGICAL HISTORY, FAMILY HISTORY, SOCIAL HISTORY AND REVIEW OF SYSTEMS I have reviewed the patient's past medical, surgical, family and social history as well as the comprehensive review of systems as included on the [facility] history form dated 12/24/2014, which I have signed.  Family History: Reviewed, no changes.  Last detailed document: 12/27/2013.   Social History: Tobacco use reviewed. Reviewed, no changes. Last detailed document date: 12/27/2013.      MEDICATIONS(added, continued or stopped this visit): Started Medication Directions Instruction Stopped   amlodipine 5 mg tablet take 1 tablet by oral route  every day     aspirin 81 mg chewable tablet chew  1 tablet by oral route  every day     Crestor 10 mg tablet take 1 tablet by oral route  every day     metoprolol succinate ER 25 mg tablet,extended release 24 hr take 1 tablet by oral route  every day    04/17/2015 Mobic 7.5 mg tablet take 1 tablet by oral route 2 times every day     multivitamin tablet Take 1 tablet daily     Nasonex 50 mcg/actuation Spray spray 2 spray by intranasal route  every day in each nostril as needed     ranitidine 75 mg tablet take 1 tablet by oral route  every day with a glass of water     tamsulosin ER 0.4 mg capsule,extended  release 24 hr take 1 capsule by oral route  every day 1/2 hour following the same meal each day     Tirosint 125 mcg capsule take 1 capsule by oral route  every day     Zyrtec 10 mg tablet take 1 tablet by oral route  every day       ALLERGIES: Ingredient Reaction Medication Name Comment  NO KNOWN ALLERGIES     No known allergies.    Vitals Date Temp F BP Pulse Ht In Wt Lb BMI BSA Pain Score  06/14/2015  154/65 86 76 239 29.09  0/10      IMPRESSION Mobile spondylolisthesis L4 and L5 with stenosis and a herniated disc at the L5-S1 level right greater than left with degeneration at this level.  He has milder degenerative changes the L2-3 and L3 L4 levels but I do not believe that those are currently symptomatic.  Comments:  needs cardiology clearance  Completed Orders (this encounter) Order Details Reason Side Interpretation Result Initial Treatment Date Region  Lifestyle education regarding diet Patient encouraged to eat a well balenced diet.        Lifestyle education Patient to follow up with primary care provider         Assessment/Plan # Detail Type Description   1. Assessment Disc displacement, lumbar (M51.26).       2. Assessment Low back pain, unspecified back pain laterality, with sciatica presence unspecified (M54.5).       3. Assessment Radiculopathy, lumbar region (M54.16).       4. Assessment Body mass index (BMI) 29.0-29.9, adult OT:[id number]).   Plan Orders Today's instructions / counseling include(s) Lifestyle education regarding diet.       5. Assessment Essential (primary) hypertension (I10).       6. Assessment Spondylolisthesis, lumbar region (M43.16).       7. 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         Pain Assessment/Treatment Pain Scale: 0/10. Method: Numeric Pain Intensity Scale. Location: back. Onset: 12/27/2012. Duration: varies. Quality: discomforting. Pain Assessment/Treatment follow-up plan of care:  Patient taking medication as prescribed.  Fall Risk Plan The patient has not fallen in the last year. Falls risk follow-up plan of care: Assisted devices: Advise to use safety measures when available.  I have recommended proceeding with decompression fusion at the L4 through sacral levels.  Risks and benefits were discussed in detail with the patient and he wishes to proceed.  He will need clearance by Dr. Burt Knack from cardiology with consideration for holding anticoagulation perioperatively.  Orders: Diagnostic Procedures: Assessment Procedure  M43.16  L4-L5 - L5-S1 MAS PLIF  M43.16 Lumbar Spine- AP/Lat  Instruction(s)/Education: Assessment Instruction  I10 Lifestyle education  Z68.29 Lifestyle  education regarding diet             Provider:  Marchia Meiers. Vertell Limber MD  06/14/2015 01:35 PM Dictation edited by: Marchia Meiers. Vertell Limber    CC Providers: [name] MD [address] [place], [place] [address]              Electronically signed by Marchia Meiers. Vertell Limber MD on 06/14/2015 01:35 PM

## 2015-08-09 ENCOUNTER — Encounter (HOSPITAL_COMMUNITY): Payer: Self-pay | Admitting: Neurosurgery

## 2015-08-09 MED ORDER — PNEUMOCOCCAL VAC POLYVALENT 25 MCG/0.5ML IJ INJ
0.5000 mL | INJECTION | INTRAMUSCULAR | Status: AC
  Administered 2015-08-10: 0.5 mL via INTRAMUSCULAR
  Filled 2015-08-09: qty 0.5

## 2015-08-09 MED ORDER — PANTOPRAZOLE SODIUM 40 MG PO TBEC
40.0000 mg | DELAYED_RELEASE_TABLET | Freq: Every day | ORAL | Status: DC
  Administered 2015-08-09 – 2015-08-10 (×2): 40 mg via ORAL
  Filled 2015-08-09 (×2): qty 1

## 2015-08-09 MED FILL — Heparin Sodium (Porcine) Inj 1000 Unit/ML: INTRAMUSCULAR | Qty: 30 | Status: AC

## 2015-08-09 MED FILL — Sodium Chloride IV Soln 0.9%: INTRAVENOUS | Qty: 1000 | Status: AC

## 2015-08-09 NOTE — Evaluation (Signed)
Occupational Therapy Evaluation Patient Details Name: Leonard Berg. MRN: [id number] DOB: [birth year] Today's Date: 08/09/2015    History of Present Illness Patient is an 82 y/o male admitted with Spondylolisthesis L 45, Herniated lumbar disc, stenosis, lumbago, radiculopathy L 45, L 5 S 1 levels, now s/p PLIF L4, L5, S1.   Clinical Impression   Pt reports he was independent with ADLs and mobility PTA. Currently pt overall min guard assist for functional mobility and min-max assist for ADLs. Began safety, ADL, and back education with pt and wife. Pt planning to d/c home with 24/7 supervision from his wife. Recommending HHOT for follow up in order to maximize independence and safety with ADLs and functional mobility upon return home. Pt would benefit from continued skilled OT to address established goals.    Follow Up Recommendations  Home health OT;Supervision/Assistance - 24 hour    Equipment Recommendations  None recommended by OT    Recommendations for Other Services       Precautions / Restrictions Precautions Precautions: Fall;Back Precaution Booklet Issued: Yes (comment) Precaution Comments: Educated pt and wife on back precautions. Required Braces or Orthoses: Spinal Brace Spinal Brace: Lumbar corset;Applied in sitting position Restrictions Weight Bearing Restrictions: No      Mobility Bed Mobility Overal bed mobility: Needs Assistance Bed Mobility: Rolling;Sit to Sidelying Rolling: Min assist Sidelying to sit: Min assist     Sit to sidelying: Min assist General bed mobility comments: Pt with difficulty performing log roll technique for return to bed despite VCs.  Transfers Overall transfer level: Needs assistance Equipment used: Rolling walker (2 wheeled) Transfers: Sit to/from Stand Sit to Stand: Min guard         General transfer comment: Hands on min guard from chair x 1. VCs for hand placement and technique. Increased time required.      Balance Overall balance assessment: Needs assistance Sitting-balance support: Feet supported;No upper extremity supported Sitting balance-Leahy Scale: Good     Standing balance support: Bilateral upper extremity supported Standing balance-Leahy Scale: Poor Standing balance comment: RW for support                            ADL Overall ADL's : Needs assistance/impaired Eating/Feeding: Set up;Sitting   Grooming: Min guard;Standing   Upper Body Bathing: Minimal assitance;Sitting   Lower Body Bathing: Maximal assistance;Sit to/from stand   Upper Body Dressing : Moderate assistance;Sitting Upper Body Dressing Details (indicate cue type and reason): to doff back brace Lower Body Dressing: Maximal assistance;Sit to/from stand Lower Body Dressing Details (indicate cue type and reason): Pt unable to cross foot over opposite knee, has AE at home but wife reports he refuses to use it. Wife to assist with LB ADLs upon d/c home. Toilet Transfer: Min guard;Ambulation;BSC;RW Toilet Transfer Details (indicate cue type and reason): Simulated by sit to stand from chair         Functional mobility during ADLs: Min guard;Rolling walker General ADL Comments: Educated pt on maintaining back precautions during functional acitivities, use of reacher. Pt with nausea during functional mobility in room; no dizziness, resolved with sitting.      Vision     Perception     Praxis      Pertinent Vitals/Pain Pain Assessment: No/denies pain Pain Score: 5  Pain Location: in lower back Pain Descriptors / Indicators: Sore;Grimacing;Operative site guarding Pain Intervention(s): Premedicated before session;Repositioned     Hand Dominance     Extremity/Trunk Assessment  Upper Extremity Assessment Upper Extremity Assessment: Overall WFL for tasks assessed   Lower Extremity Assessment Lower Extremity Assessment: Defer to PT evaluation       Communication Communication Communication:  HOH   Cognition Arousal/Alertness: Lethargic;Suspect due to medications Behavior During Therapy: [facility] for tasks assessed/performed Overall Cognitive Status: Within Functional Limits for tasks assessed                     General Comments       Exercises       Shoulder Instructions      Home Living Family/patient expects to be discharged to:: Private residence Living Arrangements: Spouse/significant other Available Help at Discharge: Family;Available 24 hours/day Type of Home: House Home Access: Stairs to enter [employer] of Steps: 3 at back with rail, 2 at front no rail Entrance Stairs-Rails: Right;Left Home Layout: Two level;Able to live on main level with bedroom/bathroom     Bathroom Shower/Tub: Walk-in shower   Bathroom Toilet: Handicapped height     Home Equipment: Environmental consultant - 2 wheels;Bedside commode;Grab bars - tub/shower;Hand held shower head;Shower seat - built Hotel manager: Reacher;Sock aid;Long-handled shoe horn;Long-handled sponge        Prior Functioning/Environment Level of Independence: Independent        Comments: reports did not use walker previous to surgery    OT Diagnosis: Generalized weakness;Acute pain   OT Problem List: Decreased strength;Decreased activity tolerance;Impaired balance (sitting and/or standing);Decreased safety awareness;Decreased knowledge of use of DME or AE;Decreased knowledge of precautions;Obesity;Pain   OT Treatment/Interventions: Self-care/ADL training;Energy conservation;DME and/or AE instruction;Therapeutic activities;Patient/family education;Balance training    OT Goals(Current goals can be found in the care plan section) Acute Rehab OT Goals Patient Stated Goal: To get stronger and go home OT Goal Formulation: With patient/family Time For Goal Achievement: 08/23/15 Potential to Achieve Goals: Good ADL Goals Pt Will Perform Grooming: with supervision;standing Pt Will  Transfer to Toilet: with supervision;ambulating;bedside commode (BSC over toilet) Pt Will Perform Toileting - Clothing Manipulation and hygiene: with supervision;sit to/from stand (with or without AE) Pt Will Perform Tub/Shower Transfer: Shower transfer;with supervision;ambulating;shower seat;rolling walker Additional ADL Goal #1: Pt will independently verbally recall 3/3 back precautions and maintain throughout ADL. Additional ADL Goal #2: Pt/caregiver will independently don/doff back brace as precursor for ADLs and functional mobility.  OT Frequency: Min 2X/week   Barriers to D/C:            Co-evaluation              End of Session Equipment Utilized During Treatment: Gait belt;Rolling walker;Back brace  Activity Tolerance: Patient limited by lethargy;Patient limited by fatigue Patient left: in bed;with call bell/phone within reach;with bed alarm set;with family/visitor present   Time: 1020-1041 OT Time Calculation (min): 21 min Charges:  OT General Charges $OT Visit: 1 Procedure OT Evaluation $OT Eval Moderate Complexity: 1 Procedure G-Codes:     Binnie Kand M.S., OTR/L Pager: [phone]  08/09/2015, 10:51 AM

## 2015-08-09 NOTE — Evaluation (Signed)
Physical Therapy Evaluation Patient Details Name: Leonard KIHM Sr. MRN: [id number] DOB: [birth year] Today's Date: 08/09/2015   History of Present Illness  Patient is an 82 y/o male admitted with Spondylolisthesis L 45, Herniated lumbar disc, stenosis, lumbago, radiculopathy L 45, L 5 S 1 levels, now s/p PLIF L4, L5, S1.  Clinical Impression  Patient presents with decreased mobility due to deficits listed in PT problem list.  He will benefit from skilled PT in the acute setting to allow return home with wife assist and follow up HHPT.    Follow Up Recommendations Home health PT;Supervision/Assistance - 24 hour    Equipment Recommendations  None recommended by PT    Recommendations for Other Services       Precautions / Restrictions Precautions Precautions: Fall;Back Required Braces or Orthoses: Spinal Brace Spinal Brace: Lumbar corset;Applied in sitting position      Mobility  Bed Mobility Overal bed mobility: Needs Assistance Bed Mobility: Rolling;Sidelying to Sit Rolling: Min guard Sidelying to sit: Min assist       General bed mobility comments: cues for sequencing/technique, use of railing; min A for stabilizing  Transfers Overall transfer level: Needs assistance Equipment used: Rolling walker (2 wheeled) Transfers: Sit to/from Stand Sit to Stand: From elevated surface;Min assist;Mod assist         General transfer comment: cues for hand placement, some lifting support given  Ambulation/Gait Ambulation/Gait assistance: Min assist Ambulation Distance (Feet): 15 Feet Assistive device: Rolling walker (2 wheeled) Gait Pattern/deviations: Step-through pattern;Decreased stride length;Shuffle     General Gait Details: generally shaky and requested not to attempt hallway yet so walked around bed to chair assist for getting around furniture with wide walker  Stairs            Wheelchair Mobility    Modified Rankin (Stroke Patients Only)        Balance Overall balance assessment: Needs assistance   Sitting balance-Leahy Scale: Good     Standing balance support: Bilateral upper extremity supported Standing balance-Leahy Scale: Poor Standing balance comment: UE support needed in standing for balance/pain                             Pertinent Vitals/Pain Pain Assessment: 0-10 Pain Score: 5  Pain Location: in lower back Pain Descriptors / Indicators: Sore;Grimacing;Operative site guarding Pain Intervention(s): Repositioned;Monitored during session;Limited activity within patient's tolerance    Home Living Family/patient expects to be discharged to:: Private residence Living Arrangements: Spouse/significant other Available Help at Discharge: Family;Available 24 hours/day Type of Home: House Home Access: Stairs to enter Entrance Stairs-Rails: Right;Left Entrance Stairs-Number of Steps: 3 at back with rail, 2 at front no rail Home Layout: Two level;Able to live on main level with bedroom/bathroom Home Equipment: Gilford Rile - 2 wheels;Bedside commode;Shower seat;Grab bars - tub/shower;Hand held shower head      Prior Function Level of Independence: Independent         Comments: reports did not use walker previous to surgery     Hand Dominance        Extremity/Trunk Assessment   Upper Extremity Assessment: Defer to OT evaluation           Lower Extremity Assessment: Generalized weakness         Communication   Communication: HOH  Cognition Arousal/Alertness: Awake/alert Behavior During Therapy: WFL for tasks assessed/performed Overall Cognitive Status: Within Functional Limits for tasks assessed (little slowed processing likely baseline)  General Comments General comments (skin integrity, edema, etc.): Reviewed back precautions wtih patient initially and again with wife in the room, assist provided for donning brace.    Exercises        Assessment/Plan     PT Assessment Patient needs continued PT services  PT Diagnosis Difficulty walking;Generalized weakness;Acute pain   PT Problem List Decreased strength;Decreased activity tolerance;Decreased knowledge of use of DME;Decreased balance;Decreased knowledge of precautions;Decreased mobility;Pain  PT Treatment Interventions DME instruction;Gait training;Stair training;Functional mobility training;Therapeutic activities;Therapeutic exercise;Patient/family education;Balance training   PT Goals (Current goals can be found in the Care Plan section) Acute Rehab PT Goals Patient Stated Goal: To get stronger and go home PT Goal Formulation: With patient/family Time For Goal Achievement: 08/14/15 Potential to Achieve Goals: Good    Frequency Min 5X/week   Barriers to discharge        Co-evaluation               End of Session Equipment Utilized During Treatment: Back brace Activity Tolerance: Patient tolerated treatment well Patient left: in chair;with call bell/phone within reach;with chair alarm set;with family/visitor present           Time: [id number] PT Time Calculation (min) (ACUTE ONLY): 29 min   Charges:   PT Evaluation $PT Eval Moderate Complexity: 1 Procedure PT Treatments $Gait Training: 8-22 mins   PT G CodesReginia Naas 08/16/15, 10:32 AM Magda Kiel, [place] 08/16/2015

## 2015-08-09 NOTE — Progress Notes (Signed)
D/C foley cath. Ambulated in room with walker x2assist, weaker on left leg.Tolerated well.

## 2015-08-09 NOTE — Progress Notes (Signed)
Patient ID: Leonard [name], male   DOB: 09-Apr-1930, 82 y.o.   MRN: [id number] Alert, conversant, without c/o pain at present.  Has been sitting up in bed after walking to bathroom. Good strength. Doing well.   Verdis Prime RN BSN

## 2015-08-09 NOTE — Discharge Instructions (Addendum)
Do not take Xarelto until Thursday 5/4 per discharge doctor.     Information on my medicine - XARELTO (Rivaroxaban)  This medication education was reviewed with me or my healthcare representative as part of my discharge preparation.  The pharmacist that spoke with me during my hospital stay was:  Saundra Shelling, [facility]  Why was Xarelto prescribed for you? Xarelto was prescribed for you to reduce the risk of a blood clot forming that can cause a stroke if you have a medical condition called atrial fibrillation (a type of irregular heartbeat).  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  What do you need to know about xarelto ? Take your Xarelto ONCE DAILY at the same time every day with your evening meal. If you have difficulty swallowing the tablet whole, you may crush it and mix in applesauce just prior to taking your dose.  Take Xarelto exactly as prescribed by your doctor and DO NOT stop taking Xarelto without talking to the doctor who prescribed the medication.  Stopping without other stroke prevention medication to take the place of Xarelto may increase your risk of developing a clot that causes a stroke.  Refill your prescription before you run out.  After discharge, you should have regular check-up appointments with your healthcare provider that is prescribing your Xarelto.  In the future your dose may need to be changed if your kidney function or weight changes by a significant amount.  What do you do if you miss a dose? If you are taking Xarelto ONCE DAILY and you miss a dose, take it as soon as you remember on the same day then continue your regularly scheduled once daily regimen the next day. Do not take two doses of Xarelto at the same time or on the same day.   Important Safety Information A possible side effect of Xarelto is bleeding. You should call your healthcare provider right away if you experience any of the following: ? Bleeding from an injury or your nose that does not  stop. ? Unusual colored urine (red or dark brown) or unusual colored stools (red or black). ? Unusual bruising for unknown reasons. ? A serious fall or if you hit your head (even if there is no bleeding).  Some medicines may interact with Xarelto and might increase your risk of bleeding while on Xarelto. To help avoid this, consult your healthcare provider or pharmacist prior to using any new prescription or non-prescription medications, including herbals, vitamins, non-steroidal anti-inflammatory drugs (NSAIDs) and supplements.  This website has more information on Xarelto: [email or web address]  Spinal Fusion Spinal fusion is a procedure to make 2 or more of your back bones (vertebrae) grow together (fuse). This stops movement between the back bones. This can help decrease pain. BEFORE THE PROCEDURE You will have a physical exam, blood tests, and imaging exams. You will talk with the person who will give you medicine that makes you sleep (general anesthetic) during the procedure. Ask your doctor about changing or stopping your medicines. If you smoke, stop smoking 2 weeks before the procedure. Do not eat or drink anything for 8 hours before the procedure. PROCEDURE A cut (incision) is made over the back bones. The back muscles are parted from the back bones. If the cushion (disc) between your back bones is causing problems, it will be removed. The area where the cushion was will be filled with extra bone. Bone may be taken from another part of your body or from a bone donor. The extra  bone helps your back bones grow together. Sometimes, medicines (bone-forming proteins) are added to the area to help you heal. In most cases, screws, rods, or metal plates are used to keep the back bones in place while they grow together. Sometimes, this procedure is done from the front of the spine. In that case, a cut may be made in your side or belly (abdomen).  AFTER THE PROCEDURE You will stay in a recovery  area until you are awake. Your blood pressure and heart will be checked. You will be given medicine (antibiotics) to prevent infection. You may continue to get fluids through a tube in your vein (IV). You should expect some pain after surgery. You will be given pain medicine. You will be taught how to move, stand, and walk. While in bed, you will be told to turn often. You will be told to roll like a log, so you can move your whole body and not twist your back. Do not turn on your own until you are told to do so.   This information is not intended to replace advice given to you by your health care provider. Make sure you discuss any questions you have with your health care provider.   Document Released: 07/24/2010 Document Revised: 06/22/2011 Document Reviewed: 09/12/2014 Elsevier Interactive Patient Education [employer].

## 2015-08-09 NOTE — Progress Notes (Signed)
Subjective: Patient reports "I'm doing ok. My legs hurt just when I walk"  Objective: Vital signs in last 24 hours: Temp:  [97.3 F (36.3 C)-99.3 F (37.4 C)] 98.3 F (36.8 C) (04/28 1057) Pulse Rate:  [79-104] 80 (04/28 1057) Resp:  [10-30] 20 (04/28 1057) BP: (111-182)/(43-97) 128/53 mmHg (04/28 1057) SpO2:  [89 %-100 %] 92 % (04/28 1057)  Intake/Output from previous day: 04/27 0701 - 04/28 0700 In: 2265 [I.V.:2150; Blood:115] Out: 2015 [Urine:1200; Drains:365; Blood:450] Intake/Output this shift:    Alert, conversant. Family present. Reports improved lumbar pain, stating bed is uncomfortable. No leg pain at present. Reports bilat thigh pain with ambulation. Strength is good BLE. Incision is flat, without erythema, bruising, or drainage. Honeycomb over Dermabond. Hemovac patent ~154ml overnight.   Lab Results: No results for input(s): WBC, HGB, HCT, PLT in the last 72 hours. BMET No results for input(s): NA, K, CL, CO2, GLUCOSE, BUN, CREATININE, CALCIUM in the last 72 hours.  Studies/Results: Dg Lumbar Spine 2-3 Views  08/08/2015  CLINICAL DATA:  L4-5 and L5-S1 PLIF. EXAM: DG C-ARM 61-120 MIN; LUMBAR SPINE - 2-3 VIEW COMPARISON:  MRI lumbar spine 06/13/2015 FINDINGS: Three intraoperative fluoroscopic images were submitted demonstrating transpedicular screws at the L4 - L5 and L5 - S1 levels. The L4 transpedicular screws are oriented somewhat caudally. Intervertebral body spacers at the L4-5 and L5-S1 levels. IMPRESSION: 3 intraoperative fluoroscopic images submitted for patient undergoing L4-5 and L5-S1 PLIF. Electronically Signed   By: Lovey Newcomer M.D.   On: 08/08/2015 16:34   Dg C-arm 1-60 Min  08/08/2015  CLINICAL DATA:  L4-5 and L5-S1 PLIF. EXAM: DG C-ARM 61-120 MIN; LUMBAR SPINE - 2-3 VIEW COMPARISON:  MRI lumbar spine 06/13/2015 FINDINGS: Three intraoperative fluoroscopic images were submitted demonstrating transpedicular screws at the L4 - L5 and L5 - S1 levels. The L4  transpedicular screws are oriented somewhat caudally. Intervertebral body spacers at the L4-5 and L5-S1 levels. IMPRESSION: 3 intraoperative fluoroscopic images submitted for patient undergoing L4-5 and L5-S1 PLIF. Electronically Signed   By: Lovey Newcomer M.D.   On: 08/08/2015 16:34    Assessment/Plan: Improving   LOS: 1 day  Continue to mobilize in LSO. Will leave drain today with plan to pull tomorrow. Encouraged increased activity when home, with plan for HHPT/OT.   Verdis Prime 08/09/2015, 12:08 PM

## 2015-08-09 NOTE — Care Management Note (Signed)
Case Management Note  Patient Details  Name: Leonard BARDIN Sr. MRN: [id number] Date of Birth: Aug 27, 1929  Subjective/Objective:                    Action/Plan: Patient was admitted for a PLIF. Lives at home with spouse. Will follow for discharge needs pending PT/OT evals and physician orders.  Expected Discharge Date:                  Expected Discharge Plan:     In-House Referral:     Discharge planning Services     Post Acute Care Choice:    Choice offered to:     DME Arranged:    DME Agency:     HH Arranged:    HH Agency:     Status of Service:  In process, will continue to follow  Medicare Important Message Given:    Date Medicare IM Given:    Medicare IM give by:    Date Additional Medicare IM Given:    Additional Medicare Important Message give by:     If discussed at [place] of Stay Meetings, dates discussed:    Additional Comments:  Rolm Baptise, RN 08/09/2015, 8:59 AM [phone]

## 2015-08-10 DIAGNOSIS — Z7901 Long term (current) use of anticoagulants: Secondary | ICD-10-CM | POA: Diagnosis not present

## 2015-08-10 DIAGNOSIS — Z7982 Long term (current) use of aspirin: Secondary | ICD-10-CM | POA: Diagnosis not present

## 2015-08-10 DIAGNOSIS — Z79899 Other long term (current) drug therapy: Secondary | ICD-10-CM | POA: Diagnosis not present

## 2015-08-10 DIAGNOSIS — I10 Essential (primary) hypertension: Secondary | ICD-10-CM | POA: Diagnosis not present

## 2015-08-10 DIAGNOSIS — E039 Hypothyroidism, unspecified: Secondary | ICD-10-CM | POA: Diagnosis not present

## 2015-08-10 DIAGNOSIS — Z6829 Body mass index (BMI) 29.0-29.9, adult: Secondary | ICD-10-CM | POA: Diagnosis not present

## 2015-08-10 DIAGNOSIS — R339 Retention of urine, unspecified: Secondary | ICD-10-CM | POA: Diagnosis not present

## 2015-08-10 DIAGNOSIS — M4806 Spinal stenosis, lumbar region: Secondary | ICD-10-CM | POA: Diagnosis not present

## 2015-08-10 DIAGNOSIS — M4316 Spondylolisthesis, lumbar region: Secondary | ICD-10-CM | POA: Diagnosis not present

## 2015-08-10 DIAGNOSIS — M5116 Intervertebral disc disorders with radiculopathy, lumbar region: Secondary | ICD-10-CM | POA: Diagnosis not present

## 2015-08-10 NOTE — Anesthesia Postprocedure Evaluation (Signed)
Anesthesia Post Note  Patient: Leonard [name]  Procedure(s) Performed: Procedure(s) (LRB): Lumbar Four-Five, Lumbar Five-Sacral One Maximum access posterior lumbar interbody fusion (N/A)  Patient location during evaluation: PACU Anesthesia Type: General Level of consciousness: awake Pain management: pain level controlled Vital Signs Assessment: post-procedure vital signs reviewed and stable Respiratory status: spontaneous breathing Cardiovascular status: stable Postop Assessment: no signs of nausea or vomiting Anesthetic complications: no    Last Vitals:  Filed Vitals:   08/10/15 0130 08/10/15 0138  BP: 124/102 125/97  Pulse: 110 103  Temp: 37.3 C   Resp: 20     Last Pain:  Filed Vitals:   08/10/15 0322  PainSc: 4                  Aniqa Hare

## 2015-08-10 NOTE — Progress Notes (Signed)
Subjective: Patient reports difficulty urinating  Objective: Vital signs in last 24 hours: Temp:  [98.3 F (36.8 C)-100.1 F (37.8 C)] 98.5 F (36.9 C) (04/29 0528) Pulse Rate:  [80-110] 107 (04/29 0528) Resp:  [18-20] 18 (04/29 0528) BP: (101-128)/(53-102) 104/59 mmHg (04/29 0528) SpO2:  [92 %-95 %] 95 % (04/29 0528)  Intake/Output from previous day: 04/28 0701 - 04/29 0700 In: -  Out: 1309 [Urine:1309] Intake/Output this shift: Total I/O In: -  Out: 1309 [Urine:1309]  Physical Exam: Strength full.  Dressing CDI.  Lab Results: No results for input(s): WBC, HGB, HCT, PLT in the last 72 hours. BMET No results for input(s): NA, K, CL, CO2, GLUCOSE, BUN, CREATININE, CALCIUM in the last 72 hours.  Studies/Results: Dg Lumbar Spine 2-3 Views  08/08/2015  CLINICAL DATA:  L4-5 and L5-S1 PLIF. EXAM: DG C-ARM 61-120 MIN; LUMBAR SPINE - 2-3 VIEW COMPARISON:  MRI lumbar spine 06/13/2015 FINDINGS: Three intraoperative fluoroscopic images were submitted demonstrating transpedicular screws at the L4 - L5 and L5 - S1 levels. The L4 transpedicular screws are oriented somewhat caudally. Intervertebral body spacers at the L4-5 and L5-S1 levels. IMPRESSION: 3 intraoperative fluoroscopic images submitted for patient undergoing L4-5 and L5-S1 PLIF. Electronically Signed   By: Lovey Newcomer M.D.   On: 08/08/2015 16:34   Dg C-arm 1-60 Min  08/08/2015  CLINICAL DATA:  L4-5 and L5-S1 PLIF. EXAM: DG C-ARM 61-120 MIN; LUMBAR SPINE - 2-3 VIEW COMPARISON:  MRI lumbar spine 06/13/2015 FINDINGS: Three intraoperative fluoroscopic images were submitted demonstrating transpedicular screws at the L4 - L5 and L5 - S1 levels. The L4 transpedicular screws are oriented somewhat caudally. Intervertebral body spacers at the L4-5 and L5-S1 levels. IMPRESSION: 3 intraoperative fluoroscopic images submitted for patient undergoing L4-5 and L5-S1 PLIF. Electronically Signed   By: Lovey Newcomer M.D.   On: 08/08/2015 16:34     Assessment/Plan: Doing well after back surgery.  Foley replaced for urinary retention.  D/C Foley in AM, possible D/C tomorrow.  Hemovac remains in place. Output not being recorded.  Will confirm output and determine if OK to remove today.    LOS: 2 days    Peggyann Shoals, MD 08/10/2015, 6:37 AM

## 2015-08-10 NOTE — Progress Notes (Signed)
Physical Therapy Treatment Patient Details Name: Leonard BEARE Sr. MRN: [id number] DOB: 10/04/1929 Today's Date: 08/10/2015    History of Present Illness Patient is an 82 y/o male admitted with Spondylolisthesis L 45, Herniated lumbar disc, stenosis, lumbago, radiculopathy L 45, L 5 S 1 levels, now s/p PLIF L4, L5, S1.    PT Comments    Patient progressing slowly towards PT goals. Tolerated gait training with Min A for balance/safety. Pt with poor safety awareness. Requires cues to adhere to back precautions during mobility. Reviewed back precautions as pt only able to recall 1/3. Discussed importance of mobility. Will plan for stair training tomorrow as tolerated as pt may be discharging. Will follow.  Follow Up Recommendations  Home health PT;Supervision/Assistance - 24 hour     Equipment Recommendations  None recommended by PT    Recommendations for Other Services       Precautions / Restrictions Precautions Precautions: Fall;Back Precaution Booklet Issued: No Precaution Comments: Reviewed back precautions. Required Braces or Orthoses: Spinal Brace Spinal Brace: Lumbar corset;Applied in sitting position Restrictions Weight Bearing Restrictions: No    Mobility  Bed Mobility Overal bed mobility: Needs Assistance Bed Mobility: Sit to Sidelying;Rolling         Sit to sidelying: Min assist General bed mobility comments: HOB flat, no use of rails however pt sleeps in recliner; despite cues for log roll technique pt getting in how he wants too.  Transfers Overall transfer level: Needs assistance Equipment used: Rolling walker (2 wheeled) Transfers: Sit to/from Stand Sit to Stand: Min assist         General transfer comment: Min A to boost from chair with cues for hand placement/technique. Increased time. Pt with long legs and chair is low.  Ambulation/Gait Ambulation/Gait assistance: Min guard Ambulation Distance (Feet): 500 Feet Assistive device: Rolling  walker (2 wheeled) Gait Pattern/deviations: Step-through pattern;Decreased stride length;Trunk flexed Gait velocity: decreased   General Gait Details: Generally shaky with poor safety awareness. 2 standing rest breaks. Cues to adhere to back precautions during mobility.    Stairs            Wheelchair Mobility    Modified Rankin (Stroke Patients Only)       Balance Overall balance assessment: Needs assistance Sitting-balance support: Feet supported;No upper extremity supported Sitting balance-Leahy Scale: Good     Standing balance support: During functional activity;Bilateral upper extremity supported Standing balance-Leahy Scale: Poor Standing balance comment: RW for support. Able to stand with SLS with BUEs on RW to have therapist doff socks.                    Cognition Arousal/Alertness: Awake/alert Behavior During Therapy: WFL for tasks assessed/performed Overall Cognitive Status: Within Functional Limits for tasks assessed                      Exercises      General Comments General comments (skin integrity, edema, etc.): Wife present during session.      Pertinent Vitals/Pain Pain Assessment: Faces Faces Pain Scale: Hurts little more Pain Location: back Pain Descriptors / Indicators: Sore Pain Intervention(s): Monitored during session;Repositioned;Premedicated before session    Home Living                      Prior Function            PT Goals (current goals can now be found in the care plan section) Progress towards PT goals: Progressing  toward goals    Frequency  Min 5X/week    PT Plan Current plan remains appropriate    Co-evaluation             End of Session Equipment Utilized During Treatment: Back brace Activity Tolerance: Patient tolerated treatment well Patient left: in bed;with call bell/phone within reach;with bed alarm set;with family/visitor present     Time: [id number] PT Time Calculation  (min) (ACUTE ONLY): 29 min  Charges:  $Gait Training: 23-37 mins                    G Codes:      Leonard Berg 08/10/2015, 2:29 PM Leonard Berg, [place], DPT [phone]

## 2015-08-10 NOTE — Progress Notes (Signed)
Occupational Therapy Treatment Patient Details Name: Leonard ALOI Sr. MRN: [id number] DOB: February 15, 1930 Today's Date: 08/10/2015    History of present illness Patient is an 82 y/o male admitted with Spondylolisthesis L 45, Herniated lumbar disc, stenosis, lumbago, radiculopathy L 45, L 5 S 1 levels, now s/p PLIF L4, L5, S1.   OT comments  Pt. Making gains with acute OT.  Able to complete bed mobility and functional mobility in room.  Reports wife available to assist with LB ADLS.  Will attempt tub/shower transfer next session.    Follow Up Recommendations  Home health OT;Supervision/Assistance - 24 hour    Equipment Recommendations  None recommended by OT    Recommendations for Other Services      Precautions / Restrictions Precautions Precautions: Fall;Back Required Braces or Orthoses: Spinal Brace Spinal Brace: Lumbar corset;Applied in sitting position       Mobility Bed Mobility Overal bed mobility: Needs Assistance Bed Mobility: Rolling;Sidelying to Sit Rolling: Min assist Sidelying to sit: Min assist       General bed mobility comments: hob slightly elevated (pt. reports he sleeps in a recliner), exited from L side of bed, heavy reliance on bed rail.  able to maintain proper log roll position and technique with instructional and tactile cues  Transfers Overall transfer level: Needs assistance Equipment used: Rolling walker (2 wheeled) Transfers: Sit to/from [facility] Sit to Stand: Min guard Stand pivot transfers: Min guard            Balance                                   ADL Overall ADL's : Needs assistance/impaired             Lower Body Bathing: Maximal assistance;Sit to/from stand Lower Body Bathing Details (indicate cue type and reason): unable to reach feet, reports wife will assist as needed     Lower Body Dressing: Maximal assistance;Sit to/from stand Lower Body Dressing Details (indicate cue type and  reason): states wife available to assist Toilet Transfer: Min guard;Ambulation;RW;Cueing for sequencing Toilet Transfer Details (indicate cue type and reason): simulated during amb. from eob to recliner. cues for hand placement to reach back before sitting down Toileting- Clothing Manipulation and Hygiene: Minimal assistance;Sit to/from stand Toileting - Clothing Manipulation Details (indicate cue type and reason): simulated during transfer in room     Functional mobility during ADLs: Min guard;Rolling walker General ADL Comments: reviewed integrating back precautions during functional mobility      Vision                     Perception     Praxis      Cognition   Behavior During Therapy: [facility] for tasks assessed/performed Overall Cognitive Status: Within Functional Limits for tasks assessed                       Extremity/Trunk Assessment               Exercises     Shoulder Instructions       General Comments      Pertinent Vitals/ Pain       Pain Assessment: No/denies pain  Home Living  Prior Functioning/Environment              Frequency Min 2X/week     Progress Toward Goals  OT Goals(current goals can now be found in the care plan section)  Progress towards OT goals: Progressing toward goals     Plan Discharge plan remains appropriate    Co-evaluation                 End of Session Equipment Utilized During Treatment: Gait belt;Back brace;Rolling walker   Activity Tolerance Patient tolerated treatment well   Patient Left in chair;with call bell/phone within reach   Nurse Communication          Time: [id number] OT Time Calculation (min): 17 min  Charges: OT General Charges $OT Visit: 1 Procedure OT Treatments $Self Care/Home Management : 8-22 mins  Janice Coffin, COTA/L 08/10/2015, 9:11 AM

## 2015-08-10 NOTE — Progress Notes (Signed)
Alert and oriented. Tachycardiac with fluctuation ranges from 120-162bpm. PVR bladder scan 914ml.Voided 100 ml clear yellow urine prior. Lower abdomen tender. Pt states, "I feel like I'm about to bust." MD notified with order of foley insertion. Order implemented.

## 2015-08-11 NOTE — Progress Notes (Signed)
Occupational Therapy Treatment Patient Details Name: Leonard Berg. MRN: [id number] DOB: [birth year] Today's Date: 08/11/2015    History of present illness Patient is an 82 y.o. male admitted with Spondylolisthesis L 45, Herniated lumbar disc, stenosis, lumbago, radiculopathy L 45, L 5 S 1 levels, now s/p PLIF L4, L5, S1.   OT comments  Education provided in session. Feel pt will continue to benefit from acute OT to increase independence and reinforce back precautions prior to d/c home.   Follow Up Recommendations  Home health OT;Supervision/Assistance - 24 hour    Equipment Recommendations  None recommended by OT    Recommendations for Other Services      Precautions / Restrictions Precautions Precautions: Fall;Back Precaution Booklet Issued: No Precaution Comments: educated on back precautions Required Braces or Orthoses: Spinal Brace Spinal Brace: Lumbar corset;Applied in sitting position Restrictions Weight Bearing Restrictions: No       Mobility Bed Mobility Overal bed mobility: Needs Assistance Bed Mobility: Rolling;Sidelying to Sit Rolling: Supervision Sidelying to sit: Supervision       General bed mobility comments: used rail. Cues for technique.   Transfers Overall transfer level: Needs assistance Equipment used: Rolling walker (2 wheeled) Transfers: Sit to/from Stand Sit to Stand: Min assist         General transfer comment: assist to boost to stand.    Balance    Min guard for simulated shower transfer using RW.                               ADL Overall ADL's : Needs assistance/impaired                 Upper Body Dressing : Minimal assistance;Sitting Upper Body Dressing Details (indicate cue type and reason): back brace     Toilet Transfer: Minimal assistance;Ambulation;RW (sit to stand from bed)       Tub/ Shower Transfer: Min guard;Ambulation;Rolling walker;Walk-in shower   Functional mobility during ADLs:  Rolling walker (Min guard for ambulation with RW; Min A for sit to stand) General ADL Comments: Discussed AE-pt did not want to practice with this and reports he doesn't wear socks in summer. Educated on shower transfer technique. Educated on back brace. Discussed incorporating precautions into functional activities. Educated on safety such as safe footwear and recommended someone be with him for shower transfer and bathing.  Educated on what pt could use for toilet aid if needed.       Vision                     Perception     Praxis      Cognition  Awake/Alert Behavior During Therapy: WFL for tasks assessed/performed Overall Cognitive Status: No family/caregiver present to determine baseline cognitive functioning       Memory: Decreased recall of precautions               Extremity/Trunk Assessment               Exercises     Shoulder Instructions       General Comments      Pertinent Vitals/ Pain       Pain Assessment: 0-10 Pain Score: 8  Pain Location: back Pain Descriptors / Indicators:  ("hurt") Pain Intervention(s): Monitored during session;Repositioned  Home Living  Prior Functioning/Environment              Frequency Min 2X/week     Progress Toward Goals  OT Goals(current goals can now be found in the care plan section)  Progress towards OT goals: Progressing toward goals  Acute Rehab OT Goals Patient Stated Goal: go home OT Goal Formulation: With patient/family Time For Goal Achievement: 08/23/15 Potential to Achieve Goals: Good ADL Goals Pt Will Perform Grooming: with supervision;standing Pt Will Transfer to Toilet: with supervision;ambulating;bedside commode (BSC over toilet) Pt Will Perform Toileting - Clothing Manipulation and hygiene: with supervision;sit to/from stand (with or without AE) Pt Will Perform Tub/Shower Transfer: Shower transfer;with  supervision;ambulating;shower seat;rolling walker Additional ADL Goal #1: Pt will independently verbally recall 3/3 back precautions and maintain throughout ADL. Additional ADL Goal #2: Pt/caregiver will independently don/doff back brace as precursor for ADLs and functional mobility.  Plan Discharge plan remains appropriate    Co-evaluation                 End of Session Equipment Utilized During Treatment: Rolling walker;Gait belt;Back brace   Activity Tolerance Patient tolerated treatment well   Patient Left in chair;with call bell/phone within reach;with chair alarm set   Nurse Communication          Time: [id number] OT Time Calculation (min): 19 min  Charges: OT General Charges $OT Visit: 1 Procedure OT Treatments $Self Care/Home Management : 8-22 mins  Benito Mccreedy OTR/L [id number] 08/11/2015, 9:23 AM

## 2015-08-11 NOTE — Progress Notes (Signed)
Physical Therapy Treatment Patient Details Name: Leonard MINCKLER Sr. MRN: [id number] DOB: [birth year] Today's Date: 08/11/2015    History of Present Illness Patient is an 82 y.o. male admitted with Spondylolisthesis L 45, Herniated lumbar disc, stenosis, lumbago, radiculopathy L 45, L 5 S 1 levels, now s/p PLIF L4, L5, S1.    PT Comments    Pt progressing well towards goals. Remains unable to recall precautions and requires freq cues to adhere to back precautions. Wife present and aware and understands all precautions. PT to con't to follow acutely.  Follow Up Recommendations  Home health PT;Supervision/Assistance - 24 hour     Equipment Recommendations  None recommended by PT    Recommendations for Other Services       Precautions / Restrictions Precautions Precautions: Fall;Back Precaution Booklet Issued: Yes (comment) Precaution Comments: educated pt and spouse on back precautions Required Braces or Orthoses: Spinal Brace Spinal Brace: Lumbar corset Restrictions Weight Bearing Restrictions: No    Mobility  Bed Mobility Overal bed mobility: Needs Assistance Bed Mobility: Rolling;Sidelying to Sit Rolling: Supervision Sidelying to sit: Supervision       General bed mobility comments: pt up in chair but reported he log rolled to get to EOB  Transfers Overall transfer level: Needs assistance Equipment used: Rolling walker (2 wheeled) Transfers: Sit to/from Stand Sit to Stand: Min assist         General transfer comment: minA due to low surface height, pt with lift chair at home, built up chair to improve indep  Ambulation/Gait Ambulation/Gait assistance: Min guard Ambulation Distance (Feet): 250 Feet Assistive device: Rolling walker (2 wheeled) Gait Pattern/deviations: Step-through pattern Gait velocity: dec   General Gait Details: v/c's to decreased UE dependence, "relax shoulders"    Stairs Stairs: Yes Stairs assistance: Min assist Stair  Management: One rail Right;Sideways Number of Stairs: 3 General stair comments: v/c's for sequencing  Wheelchair Mobility    Modified Rankin (Stroke Patients Only)       Balance Overall balance assessment: Needs assistance         Standing balance support: During functional activity Standing balance-Leahy Scale: Poor Standing balance comment: pt required use of RW to stand on 1 foot to change out socks for his crocs                    Cognition Arousal/Alertness: Awake/alert Behavior During Therapy: WFL for tasks assessed/performed Overall Cognitive Status: Within Functional Limits for tasks assessed       Memory: Decreased recall of precautions              Exercises      General Comments        Pertinent Vitals/Pain Pain Assessment: 0-10 Pain Score: 3  Pain Location: back Pain Descriptors / Indicators: Sore Pain Intervention(s): Monitored during session    Home Living                      Prior Function            PT Goals (current goals can now be found in the care plan section) Acute Rehab PT Goals Patient Stated Goal: go home Progress towards PT goals: Progressing toward goals    Frequency  Min 5X/week    PT Plan Current plan remains appropriate    Co-evaluation             End of Session Equipment Utilized During Treatment: Gait belt;Back brace Activity Tolerance: Patient tolerated treatment well  Patient left: in chair;with call bell/phone within reach;with chair alarm set;with family/visitor present     Time: 0935-1004 PT Time Calculation (min) (ACUTE ONLY): 29 min  Charges:  $Gait Training: 8-22 mins $Therapeutic Activity: 8-22 mins                    G Codes:      Kingsley Callander 08/11/2015, 10:09 AM   Kittie Plater, PT, DPT Pager #: [phone] Office #: [phone]

## 2015-08-11 NOTE — Progress Notes (Signed)
Patient ID: Leonard [name], male   DOB: 04/07/1930, 82 y.o.   MRN: [id number] BP 114/64 mmHg  Pulse 81  Temp(Src) 98.5 F (36.9 C) (Oral)  Resp 18  Ht 6' 2.5" (1.892 m)  Wt 110.309 kg (243 lb 3 oz)  BMI 30.82 kg/m2  SpO2 94% Alert and oriented x 4 speech is clear and fluent Mild weakness left dorsiflexors compared to normal on right side Wound is clean and dry, will remove drain Voiding, will check pvrs.

## 2015-08-12 MED ORDER — OXYCODONE-ACETAMINOPHEN 5-325 MG PO TABS: 1.0000 | ORAL_TABLET | ORAL | Status: DC | PRN

## 2015-08-12 MED ORDER — METHOCARBAMOL 500 MG PO TABS: 500.0000 mg | ORAL_TABLET | Freq: Four times a day (QID) | ORAL | Status: DC | PRN

## 2015-08-12 NOTE — Progress Notes (Signed)
Pt requesting enema/ suppository before he is discharged. Last bowel movement 4/26. md aware of this and okayed discharge of pt.

## 2015-08-12 NOTE — Progress Notes (Signed)
Pt discharged home with family, by car, assessment stable, prescriptions given, discharge instructions reviewed, all questions answered. IV removed. Pt taken by wheelchair to exit. Time of discharge: 1607

## 2015-08-12 NOTE — Progress Notes (Addendum)
Pt has had bowel movement after suppository, several small hard clumps of stool.   Pt discussed that he wanted to drink magnesium citrate. Plan is for pt to pick up some from pharmacy and drink at home after discharge.

## 2015-08-12 NOTE — Discharge Summary (Signed)
Physician Discharge Summary  Patient ID: Leonard [name] MRN: [id number] DOB/AGE: 1930-02-23 82 y.o.  Admit date: 08/08/2015 Discharge date: 08/12/2015  Admission Diagnoses: Spondylolisthesis L 45, Herniated lumbar disc, stenosis, lumbago, radiculopathy L 45, L 5 S 1 levels   Discharge Diagnoses: Spondylolisthesis L 45, Herniated lumbar disc, stenosis, lumbago, radiculopathy L 45, L 5 S 1 levels s/p Lumbar Four-Five, Lumbar Five-Sacral One Maximum access posterior lumbar interbody fusion (N/A) - LUMBAR FOUR-FIVE ,LUMBAR FIVE -SACRAL Maximum access posterior lumbar interbody fusion with discectomy, PEEK cages, pedicle screw fixation, posterolateral arthrodesis L 45, L 5 S 1 levels   Active Problems:   Spondylolisthesis of lumbar region   Discharged Condition: good  Hospital Course: Leonard Berg was admitted for surgery with diagnosis spondylolisthesis L4-L5, stenosis, and radiculopathy. Following uncomplicated MAS PLIF, patient recovered nicely and [facility] and nursing care. He has progressed steadily, with one episode of urinary retention, now resolved.  Consults: None  Significant Diagnostic Studies: radiology: X-Ray: intra-op  Treatments: surgery: Lumbar Four-Five, Lumbar Five-Sacral One Maximum access posterior lumbar interbody fusion (N/A) - LUMBAR FOUR-FIVE ,LUMBAR FIVE -SACRAL Maximum access posterior lumbar interbody fusion with discectomy, PEEK cages, pedicle screw fixation, posterolateral arthrodesis L 45, L 5 S 1 levels    Discharge Exam: Blood pressure 129/64, pulse 82, temperature 98.4 F (36.9 C), temperature source Oral, resp. rate 18, height 6' 2.5" (1.892 m), weight 110.309 kg (243 lb 3 oz), SpO2 94 %. Sitting on EOB, eating breakfast, smiling. Reports urge incontinence, managed with condom cath. Pain well-controlled. Incision flat without erythema, swelling, or drainage. Honeycomb over Dermabond intact.    Disposition:  01-Home or Self Care  Follow up in office 3-4 weeks. Patient verbalizes understanding of discharge instructions.     Medication List    STOP taking these medications        XARELTO 20 MG Tabs tablet  Generic drug:  rivaroxaban      TAKE these medications        acetaminophen 500 MG tablet  Commonly known as:  TYLENOL  Take 500-1,000 mg by mouth daily as needed for mild pain.     amLODipine 5 MG tablet  Commonly known as:  NORVASC  Take 1 tablet (5 mg total) by mouth daily.     aspirin 81 MG tablet  Take 81 mg by mouth daily.     cetirizine 10 MG tablet  Commonly known as:  ZYRTEC  Take 10 mg by mouth daily.     furosemide 40 MG tablet  Commonly known as:  LASIX  Take 1 tablet (40 mg total) by mouth daily.     levothyroxine 125 MCG tablet  Commonly known as:  SYNTHROID, LEVOTHROID  Take 125 mcg by mouth daily.     methocarbamol 500 MG tablet  Commonly known as:  ROBAXIN  Take 1 tablet (500 mg total) by mouth every 6 (six) hours as needed for muscle spasms.     metoprolol succinate 25 MG 24 hr tablet  Commonly known as:  TOPROL XL  Take 1 tablet (25 mg total) by mouth daily.     multivitamin tablet  Take 1 tablet by mouth daily.     NASONEX 50 MCG/ACT nasal spray  Generic drug:  mometasone  Place 2 sprays into the nose daily as needed (allergies).     oxyCODONE-acetaminophen 5-325 MG tablet  Commonly known as:  PERCOCET/ROXICET  Take 1-2 tablets by mouth every 4 (four) hours as needed for moderate pain.  ranitidine 75 MG tablet  Commonly known as:  ZANTAC  Take 75 mg by mouth daily as needed for heartburn.     rosuvastatin 10 MG tablet  Commonly known as:  CRESTOR  Take 10 mg by mouth at bedtime.     tamsulosin 0.4 MG Caps capsule  Commonly known as:  FLOMAX  Take 0.4 mg by mouth daily.         Signed: Verdis Prime 08/12/2015, 9:08 AM

## 2015-08-12 NOTE — Progress Notes (Signed)
Subjective: Patient reports "I'm 100% better today than yesterday!"  Objective: Vital signs in last 24 hours: Temp:  [97.7 F (36.5 C)-98.7 F (37.1 C)] 98.4 F (36.9 C) (05/01 0510) Pulse Rate:  [77-91] 82 (05/01 0510) Resp:  [18-20] 18 (05/01 0510) BP: (108-140)/(47-64) 129/64 mmHg (05/01 0510) SpO2:  [94 %-97 %] 94 % (05/01 0510)  Intake/Output from previous day: 04/30 0701 - 05/01 0700 In: -  Out: 100 [Urine:100] Intake/Output this shift:    Sitting on EOB, eating breakfast, smiling. Reports urge incontinence, managed with condom cath. Pain well-controlled. Incision flat without erythema, swelling, or drainage. Honeycomb over Dermabond intact.   Lab Results: No results for input(s): WBC, HGB, HCT, PLT in the last 72 hours. BMET No results for input(s): NA, K, CL, CO2, GLUCOSE, BUN, CREATININE, CALCIUM in the last 72 hours.  Studies/Results: No results found.  Assessment/Plan: Improving   LOS: 4 days  Continue to mobilize in LSO. Will d/c d/c plans with DrStern.   Leonard Berg 08/12/2015, 8:24 AM

## 2015-08-12 NOTE — Progress Notes (Signed)
Physical Therapy Treatment Patient Details Name: Leonard Berg. MRN: [id number] DOB: 1929/10/16 Today's Date: 08/12/2015    History of Present Illness Patient is an 82 y.o. male admitted with Spondylolisthesis L 45, Herniated lumbar disc, stenosis, lumbago, radiculopathy L 45, L 5 S 1 levels, now s/p PLIF L4, L5, S1.    PT Comments    Pt progressing with PT regarding mobility. Recommending continued use of rw for ambulation at home. Reinforcing back precautions before and throughout session as needed. Pt would benefit from HHPT services for continued rehabilitation to improve safety and independence.   Follow Up Recommendations  Home health PT;Supervision/Assistance - 24 hour     Equipment Recommendations  None recommended by PT    Recommendations for Other Services       Precautions / Restrictions Precautions Precautions: Fall;Back Precaution Comments: reviewed back precautions, pt unable to recall  Required Braces or Orthoses: Spinal Brace Spinal Brace: Lumbar corset Restrictions Weight Bearing Restrictions: No    Mobility  Bed Mobility Overal bed mobility: Needs Assistance Bed Mobility: Rolling;Sidelying to Sit Rolling: Supervision Sidelying to sit: Min assist (min asssist with LEs)     Sit to sidelying: Min assist (min assist with LEs) General bed mobility comments: cues provided as needed for logroll.   Transfers Overall transfer level: Needs assistance Equipment used: Rolling walker (2 wheeled) Transfers: Sit to/from Stand Sit to Stand: Min guard         General transfer comment: repeat X2, difficulty with maintaining neutral spine with standing, improved with eleveated bed height.   Ambulation/Gait Ambulation/Gait assistance: Min guard Ambulation Distance (Feet): 225 Feet Assistive device: Rolling walker (2 wheeled) Gait Pattern/deviations: Step-through pattern Gait velocity: decreased   General Gait Details: cues for posture and decreased  pressure through walker   Stairs         General stair comments: pt declined, reports feeling confident  Wheelchair Mobility    Modified Rankin (Stroke Patients Only)       Balance Overall balance assessment: Needs assistance Sitting-balance support: No upper extremity supported Sitting balance-Leahy Scale: Good     Standing balance support: Bilateral upper extremity supported Standing balance-Leahy Scale: Poor Standing balance comment: using rw                    Cognition Arousal/Alertness: Awake/alert Behavior During Therapy: WFL for tasks assessed/performed Overall Cognitive Status: Within Functional Limits for tasks assessed                      Exercises      General Comments        Pertinent Vitals/Pain Pain Assessment: 0-10 Pain Score: 5  Pain Location: back Pain Descriptors / Indicators:  (hurts) Pain Intervention(s): Limited activity within patient's tolerance;Monitored during session    Home Living                      Prior Function            PT Goals (current goals can now be found in the care plan section) Acute Rehab PT Goals Patient Stated Goal: go home PT Goal Formulation: With patient/family Time For Goal Achievement: 08/14/15 Potential to Achieve Goals: Good Progress towards PT goals: Progressing toward goals    Frequency  Min 5X/week    PT Plan Current plan remains appropriate    Co-evaluation             End of Session Equipment Utilized During Treatment:  Gait belt;Back brace Activity Tolerance: Patient tolerated treatment well Patient left: in bed;with call bell/phone within reach;with SCD's reapplied     Time: [id number] PT Time Calculation (min) (ACUTE ONLY): 25 min  Charges:  $Gait Training: 8-22 mins $Therapeutic Activity: 8-22 mins                    G Codes:      Cassell Clement, PT, CSCS Pager [phone] Office [phone]  08/12/2015, 9:06 AM

## 2015-08-12 NOTE — Care Management Important Message (Signed)
Important Message  Patient Details  Name: Leonard WIRKKALA Sr. MRN: [id number] Date of Birth: 1930-01-28   Medicare Important Message Given:  Yes    Bard Haupert P Ernie Sagrero 08/12/2015, 1:56 PM

## 2015-08-12 NOTE — Care Management Note (Signed)
Case Management Note  Patient Details  Name: Leonard NUZUM Sr. MRN: [id number] Date of Birth: 1929-06-21  Subjective/Objective:                    Action/Plan: Patient discharging to home with [facility] services. CM met with the patient and his wife and provided them a list of [facility] agencies in the [place] area. They have used [facility] in the past and would like to use them again. Mary with [facility] notified and accepted the referral. Will update the bedside RN.   Expected Discharge Date:                  Expected Discharge Plan:  [place]  In-House Referral:     Discharge planning Services  CM Consult  Post Acute Care Choice:    Choice offered to:  Spouse  DME Arranged:    DME Agency:     HH Arranged:  PT, OT HH Agency:  [facility]  Status of Service:  Completed, signed off  Medicare Important Message Given:    Date Medicare IM Given:    Medicare IM give by:    Date Additional Medicare IM Given:    Additional Medicare Important Message give by:     If discussed at [place] of Stay Meetings, dates discussed:    Additional Comments:  Pollie Friar, RN 08/12/2015, 10:54 AM

## 2015-08-13 DIAGNOSIS — M5126 Other intervertebral disc displacement, lumbar region: Secondary | ICD-10-CM | POA: Diagnosis not present

## 2015-08-13 DIAGNOSIS — Z4789 Encounter for other orthopedic aftercare: Secondary | ICD-10-CM | POA: Diagnosis not present

## 2015-08-13 DIAGNOSIS — M4806 Spinal stenosis, lumbar region: Secondary | ICD-10-CM | POA: Diagnosis not present

## 2015-08-13 DIAGNOSIS — M5416 Radiculopathy, lumbar region: Secondary | ICD-10-CM | POA: Diagnosis not present

## 2015-08-13 DIAGNOSIS — M4316 Spondylolisthesis, lumbar region: Secondary | ICD-10-CM | POA: Diagnosis not present

## 2015-08-13 DIAGNOSIS — M5441 Lumbago with sciatica, right side: Secondary | ICD-10-CM | POA: Diagnosis not present

## 2015-08-15 ENCOUNTER — Other Ambulatory Visit: Payer: Self-pay

## 2015-08-15 DIAGNOSIS — I10 Essential (primary) hypertension: Secondary | ICD-10-CM

## 2015-08-15 DIAGNOSIS — E785 Hyperlipidemia, unspecified: Secondary | ICD-10-CM

## 2015-08-15 MED ORDER — ROSUVASTATIN CALCIUM 10 MG PO TABS: 10.0000 mg | ORAL_TABLET | Freq: Every day | ORAL | Status: DC

## 2015-08-15 MED ORDER — LEVOTHYROXINE SODIUM 125 MCG PO TABS: 125.0000 ug | ORAL_TABLET | Freq: Every day | ORAL | Status: DC

## 2015-08-15 MED ORDER — TAMSULOSIN HCL 0.4 MG PO CAPS: 0.4000 mg | ORAL_CAPSULE | Freq: Every day | ORAL | Status: DC

## 2015-08-15 MED ORDER — METOPROLOL SUCCINATE ER 25 MG PO TB24: 25.0000 mg | ORAL_TABLET | Freq: Every day | ORAL | Status: DC

## 2015-08-15 NOTE — Telephone Encounter (Signed)
Chris with [facility] left v/m; pt has been taking metoprolol succinate 25 mg taking one tab bid by Dr Hardin Negus. Chris received the new rx for taking once daily and Gerald Stabs request cb to verify changing instructions.

## 2015-08-15 NOTE — Telephone Encounter (Signed)
[facility] called with refill request for levothyroxine 125 mcg (oked # 30 x 3 from 05/23/15 visit) and tamsulosin  0.4 mg(oked # 30 x 3) [facility] requested refill metoprolol succinate 25 mg 24 hr tab; on our med list taking one tab daily but pharmacy said pt has been taking 1 tab bid. Also requested refill on rosuvastatin 10mg (pharmacy said pt been taking rosuvastatin since 2015) but on 05/23/15 office note atorvastatin is listed as pt taking. Please advise.

## 2015-08-16 NOTE — Telephone Encounter (Signed)
Called and spoken to patient's wife. Review patient's medications bottle. Corrected that patient is to take metoprolol 1 tablet once a day. Patient is also taking crestor.

## 2015-08-16 NOTE — Addendum Note (Signed)
Addended by: Jacqualin Combes on: 08/16/2015 11:56 AM   Modules accepted: Orders, Medications

## 2015-09-04 DIAGNOSIS — M4316 Spondylolisthesis, lumbar region: Secondary | ICD-10-CM | POA: Diagnosis not present

## 2015-10-16 DIAGNOSIS — M5416 Radiculopathy, lumbar region: Secondary | ICD-10-CM | POA: Diagnosis not present

## 2015-10-16 DIAGNOSIS — M5126 Other intervertebral disc displacement, lumbar region: Secondary | ICD-10-CM | POA: Diagnosis not present

## 2015-10-16 DIAGNOSIS — M4316 Spondylolisthesis, lumbar region: Secondary | ICD-10-CM | POA: Diagnosis not present

## 2015-10-16 DIAGNOSIS — M545 Low back pain: Secondary | ICD-10-CM | POA: Diagnosis not present

## 2015-10-22 DIAGNOSIS — D225 Melanocytic nevi of trunk: Secondary | ICD-10-CM | POA: Diagnosis not present

## 2015-10-22 DIAGNOSIS — L570 Actinic keratosis: Secondary | ICD-10-CM | POA: Diagnosis not present

## 2015-10-22 DIAGNOSIS — X32XXXA Exposure to sunlight, initial encounter: Secondary | ICD-10-CM | POA: Diagnosis not present

## 2015-10-22 DIAGNOSIS — D0462 Carcinoma in situ of skin of left upper limb, including shoulder: Secondary | ICD-10-CM | POA: Diagnosis not present

## 2015-10-23 ENCOUNTER — Encounter (HOSPITAL_COMMUNITY): Payer: Medicare Other

## 2015-10-23 ENCOUNTER — Ambulatory Visit: Payer: Medicare Other | Admitting: Family

## 2015-10-31 ENCOUNTER — Encounter: Payer: Self-pay | Admitting: Family

## 2015-11-04 ENCOUNTER — Encounter (HOSPITAL_COMMUNITY): Payer: Medicare Other

## 2015-11-04 ENCOUNTER — Ambulatory Visit (HOSPITAL_COMMUNITY): Admission: RE | Admit: 2015-11-04 | Payer: Medicare Other | Source: Ambulatory Visit

## 2015-11-05 ENCOUNTER — Other Ambulatory Visit: Payer: Self-pay | Admitting: Primary Care

## 2015-11-05 DIAGNOSIS — E039 Hypothyroidism, unspecified: Secondary | ICD-10-CM

## 2015-11-05 DIAGNOSIS — Z125 Encounter for screening for malignant neoplasm of prostate: Secondary | ICD-10-CM

## 2015-11-05 DIAGNOSIS — E785 Hyperlipidemia, unspecified: Secondary | ICD-10-CM

## 2015-11-05 DIAGNOSIS — I10 Essential (primary) hypertension: Secondary | ICD-10-CM

## 2015-11-05 DIAGNOSIS — R739 Hyperglycemia, unspecified: Secondary | ICD-10-CM

## 2015-11-06 ENCOUNTER — Encounter: Payer: Self-pay | Admitting: Family

## 2015-11-06 ENCOUNTER — Ambulatory Visit (INDEPENDENT_AMBULATORY_CARE_PROVIDER_SITE_OTHER): Payer: Medicare Other | Admitting: Family

## 2015-11-06 ENCOUNTER — Ambulatory Visit (HOSPITAL_COMMUNITY)
Admission: RE | Admit: 2015-11-06 | Discharge: 2015-11-06 | Disposition: A | Discharge status: Home / Self Care | Payer: Medicare Other | Source: Ambulatory Visit | Attending: Family | Admitting: Family

## 2015-11-06 VITALS — BP 124/68 | HR 67 | Temp 97.1°F | Resp 18 | Ht 74.0 in | Wt 235.5 lb

## 2015-11-06 DIAGNOSIS — K219 Gastro-esophageal reflux disease without esophagitis: Secondary | ICD-10-CM | POA: Insufficient documentation

## 2015-11-06 DIAGNOSIS — I2510 Atherosclerotic heart disease of native coronary artery without angina pectoris: Secondary | ICD-10-CM | POA: Insufficient documentation

## 2015-11-06 DIAGNOSIS — Z48812 Encounter for surgical aftercare following surgery on the circulatory system: Secondary | ICD-10-CM

## 2015-11-06 DIAGNOSIS — E039 Hypothyroidism, unspecified: Secondary | ICD-10-CM | POA: Diagnosis not present

## 2015-11-06 DIAGNOSIS — I10 Essential (primary) hypertension: Secondary | ICD-10-CM | POA: Insufficient documentation

## 2015-11-06 DIAGNOSIS — Z9889 Other specified postprocedural states: Secondary | ICD-10-CM

## 2015-11-06 DIAGNOSIS — I252 Old myocardial infarction: Secondary | ICD-10-CM | POA: Diagnosis not present

## 2015-11-06 DIAGNOSIS — E785 Hyperlipidemia, unspecified: Secondary | ICD-10-CM | POA: Insufficient documentation

## 2015-11-06 DIAGNOSIS — Z87891 Personal history of nicotine dependence: Secondary | ICD-10-CM | POA: Diagnosis not present

## 2015-11-06 DIAGNOSIS — I6523 Occlusion and stenosis of bilateral carotid arteries: Secondary | ICD-10-CM | POA: Diagnosis not present

## 2015-11-06 LAB — VAS US CAROTID
LCCADSYS: -53 cm/s
LCCAPDIAS: 12 cm/s
LCCAPSYS: 81 cm/s
LICAPSYS: -57 cm/s
Left CCA dist dias: -11 cm/s
Left ICA dist dias: -20 cm/s
Left ICA dist sys: -70 cm/s
Left ICA prox dias: -14 cm/s
RCCAPSYS: 113 cm/s
RIGHT CCA MID DIAS: -11 cm/s
RIGHT ECA DIAS: 0 cm/s
Right CCA prox dias: 16 cm/s
Right cca dist sys: -82 cm/s
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
## 2015-11-06 NOTE — Patient Instructions (Signed)
Stroke Prevention Some medical conditions and behaviors are associated with an increased chance of having a stroke. You may prevent a stroke by making healthy choices and managing medical conditions. HOW CAN I REDUCE MY RISK OF HAVING A STROKE?   Stay physically active. Get at least 30 minutes of activity on most or all days.  Do not smoke. It may also be helpful to avoid exposure to secondhand smoke.  Limit alcohol use. Moderate alcohol use is considered to be:  No more than 2 drinks per day for men.  No more than 1 drink per day for nonpregnant women.  Eat healthy foods. This involves:  Eating 5 or more servings of fruits and vegetables a day.  Making dietary changes that address high blood pressure (hypertension), high cholesterol, diabetes, or obesity.  Manage your cholesterol levels.  Making food choices that are high in fiber and low in saturated fat, trans fat, and cholesterol may control cholesterol levels.  Take any prescribed medicines to control cholesterol as directed by your health care provider.  Manage your diabetes.  Controlling your carbohydrate and sugar intake is recommended to manage diabetes.  Take any prescribed medicines to control diabetes as directed by your health care provider.  Control your hypertension.  Making food choices that are low in salt (sodium), saturated fat, trans fat, and cholesterol is recommended to manage hypertension.  Ask your health care provider if you need treatment to lower your blood pressure. Take any prescribed medicines to control hypertension as directed by your health care provider.  If you are 18-39 years of age, have your blood pressure checked every 3-5 years. If you are 40 years of age or older, have your blood pressure checked every year.  Maintain a healthy weight.  Reducing calorie intake and making food choices that are low in sodium, saturated fat, trans fat, and cholesterol are recommended to manage  weight.  Stop drug abuse.  Avoid taking birth control pills.  Talk to your health care provider about the risks of taking birth control pills if you are over 35 years old, smoke, get migraines, or have ever had a blood clot.  Get evaluated for sleep disorders (sleep apnea).  Talk to your health care provider about getting a sleep evaluation if you snore a lot or have excessive sleepiness.  Take medicines only as directed by your health care provider.  For some people, aspirin or blood thinners (anticoagulants) are helpful in reducing the risk of forming abnormal blood clots that can lead to stroke. If you have the irregular heart rhythm of atrial fibrillation, you should be on a blood thinner unless there is a good reason you cannot take them.  Understand all your medicine instructions.  Make sure that other conditions (such as anemia or atherosclerosis) are addressed. SEEK IMMEDIATE MEDICAL CARE IF:   You have sudden weakness or numbness of the face, arm, or leg, especially on one side of the body.  Your face or eyelid droops to one side.  You have sudden confusion.  You have trouble speaking (aphasia) or understanding.  You have sudden trouble seeing in one or both eyes.  You have sudden trouble walking.  You have dizziness.  You have a loss of balance or coordination.  You have a sudden, severe headache with no known cause.  You have new chest pain or an irregular heartbeat. Any of these symptoms may represent a serious problem that is an emergency. Do not wait to see if the symptoms will   go away. Get medical help at once. Call your local emergency services (911 in [place] Do not drive yourself to the hospital.   This information is not intended to replace advice given to you by your health care provider. Make sure you discuss any questions you have with your health care provider.   Document Released: 05/07/2004 Document Revised: 04/20/2014 Document Reviewed:  09/30/2012 Elsevier Interactive Patient Education 2016 [employer].  

## 2015-11-12 ENCOUNTER — Other Ambulatory Visit (INDEPENDENT_AMBULATORY_CARE_PROVIDER_SITE_OTHER): Payer: Medicare Other

## 2015-11-12 DIAGNOSIS — R739 Hyperglycemia, unspecified: Secondary | ICD-10-CM

## 2015-11-12 DIAGNOSIS — I10 Essential (primary) hypertension: Secondary | ICD-10-CM

## 2015-11-12 DIAGNOSIS — E039 Hypothyroidism, unspecified: Secondary | ICD-10-CM | POA: Diagnosis not present

## 2015-11-12 DIAGNOSIS — E785 Hyperlipidemia, unspecified: Secondary | ICD-10-CM | POA: Diagnosis not present

## 2015-11-12 DIAGNOSIS — Z125 Encounter for screening for malignant neoplasm of prostate: Secondary | ICD-10-CM | POA: Diagnosis not present

## 2015-11-12 LAB — COMPREHENSIVE METABOLIC PANEL
ALT: 15 U/L (ref 0–53)
AST: 18 U/L (ref 0–37)
Albumin: 4 g/dL (ref 3.5–5.2)
Alkaline Phosphatase: 82 U/L (ref 39–117)
BUN: 14 mg/dL (ref 6–23)
CO2: 30 meq/L (ref 19–32)
Calcium: 9.5 mg/dL (ref 8.4–10.5)
Chloride: 107 mEq/L (ref 96–112)
Creatinine, Ser: 1.27 mg/dL (ref 0.40–1.50)
GFR: 57.48 mL/min — AB (ref >60.00)
GLUCOSE: 97 mg/dL (ref 70–99)
POTASSIUM: 4.5 meq/L (ref 3.5–5.1)
Sodium: 143 mEq/L (ref 135–145)
Total Bilirubin: 0.7 mg/dL (ref 0.2–1.2)
Total Protein: 6.7 g/dL (ref 6.0–8.3)

## 2015-11-12 LAB — TSH: TSH: 2.49 u[IU]/mL (ref 0.35–4.50)

## 2015-11-12 LAB — LIPID PANEL
CHOL/HDL RATIO: 4
Cholesterol: 160 mg/dL (ref 0–200)
HDL: 42.7 mg/dL (ref >39.00)
LDL CALC: 87 mg/dL (ref 0–99)
NonHDL: 117.35
TRIGLYCERIDES: 152 mg/dL — AB (ref 0.0–149.0)
VLDL: 30.4 mg/dL (ref 0.0–40.0)

## 2015-11-12 LAB — PSA, MEDICARE: PSA: 1.42 ng/ml (ref 0.10–4.00)

## 2015-11-12 LAB — HEMOGLOBIN A1C: Hgb A1c MFr Bld: 6.3 % (ref 4.6–6.5)

## 2015-11-19 ENCOUNTER — Encounter: Payer: Self-pay | Admitting: Primary Care

## 2015-11-19 ENCOUNTER — Ambulatory Visit (INDEPENDENT_AMBULATORY_CARE_PROVIDER_SITE_OTHER): Payer: Medicare Other | Admitting: Primary Care

## 2015-11-19 VITALS — BP 130/64 | HR 99 | Temp 97.7°F | Ht 74.0 in | Wt 225.4 lb

## 2015-11-19 DIAGNOSIS — M4316 Spondylolisthesis, lumbar region: Secondary | ICD-10-CM

## 2015-11-19 DIAGNOSIS — E038 Other specified hypothyroidism: Secondary | ICD-10-CM

## 2015-11-19 DIAGNOSIS — E782 Mixed hyperlipidemia: Secondary | ICD-10-CM

## 2015-11-19 DIAGNOSIS — Z0000 Encounter for general adult medical examination without abnormal findings: Secondary | ICD-10-CM

## 2015-11-19 DIAGNOSIS — I4891 Unspecified atrial fibrillation: Secondary | ICD-10-CM

## 2015-11-19 DIAGNOSIS — Z23 Encounter for immunization: Secondary | ICD-10-CM

## 2015-11-19 DIAGNOSIS — N3001 Acute cystitis with hematuria: Secondary | ICD-10-CM | POA: Diagnosis not present

## 2015-11-19 DIAGNOSIS — I10 Essential (primary) hypertension: Secondary | ICD-10-CM

## 2015-11-19 LAB — POC URINALSYSI DIPSTICK (AUTOMATED)
Bilirubin, UA: NEGATIVE
Glucose, UA: NEGATIVE
KETONES UA: NEGATIVE
Nitrite, UA: NEGATIVE
PH UA: 5.5
UROBILINOGEN UA: 0.2

## 2015-11-19 MED ORDER — SULFAMETHOXAZOLE-TRIMETHOPRIM 800-160 MG PO TABS: 1.0000 | ORAL_TABLET | Freq: Two times a day (BID) | ORAL | 0 refills | Status: DC

## 2015-11-19 MED ORDER — ZOSTER VACCINE LIVE 19400 UNT/0.65ML ~~LOC~~ SUSR: 0.6500 mL | Freq: Once | SUBCUTANEOUS | 0 refills | Status: AC

## 2015-11-19 NOTE — Assessment & Plan Note (Signed)
Regular rate and rhythm today. Continue Xarelto.

## 2015-11-19 NOTE — Assessment & Plan Note (Signed)
TSH stable on current dose. Continue levothyroxine 125 g.

## 2015-11-19 NOTE — Progress Notes (Signed)
Patient ID: Leonard [name], male   DOB: 03/09/1930, 82 y.o.   MRN: [id number]  Leonard Berg is an 82 year old male who presents today for his annual wellness exam.  HPI:  Past Medical History:  Diagnosis Date  . Anemia   . Arrhythmia    PAROXYSMAL ATRIAL FIBRILLATION  . Arthritis    OSTEOARTHRITIS  . Cataracts, bilateral   . Chronic back pain   . Coronary artery disease 8/11   CABG...LM EMERGENT WITH IABP sees Dr. Legrand Como cooper  . DJD (degenerative joint disease)   . DVT (deep venous thrombosis) ([place])   . GERD (gastroesophageal reflux disease)   . History of BPH   . Hyperlipidemia   . Hypertension   . Hypothyroidism   . Myocardial infarction ([place])   . Pulmonary embolism ([place]) 2009   hx of  . RBBB (right bundle branch block)   . Renal artery stenosis ([place])   . Thrombocytopenia ([place])   . Thyroid disease    HYPOTHYROIDISM  . Unstable angina ([facility])    NONE IN LONG TIME    Current Outpatient Prescriptions  Medication Sig Dispense Refill  . amLODipine (NORVASC) 5 MG tablet Take 1 tablet (5 mg total) by mouth daily. 90 tablet 3  . aspirin 81 MG tablet Take 81 mg by mouth daily.      . cetirizine (ZYRTEC) 10 MG tablet Take 10 mg by mouth daily.    Mariane Baumgarten Calcium (STOOL SOFTENER PO) Take 2 tablets by mouth daily as needed.    . furosemide (LASIX) 40 MG tablet Take 1 tablet (40 mg total) by mouth daily. 90 tablet 2  . levothyroxine (SYNTHROID, LEVOTHROID) 125 MCG tablet Take 1 tablet (125 mcg total) by mouth daily. 30 tablet 3  . metoprolol succinate (TOPROL XL) 25 MG 24 hr tablet Take 1 tablet (25 mg total) by mouth daily. 30 tablet 3  . rivaroxaban (XARELTO) 20 MG TABS tablet Take 20 mg by mouth daily with supper.    . rosuvastatin (CRESTOR) 10 MG tablet Take 1 tablet (10 mg total) by mouth at bedtime. 30 tablet 3  . Sennosides-Docusate Sodium (SENNA PLUS PO) Take 2 tablets by mouth daily as needed.    . tamsulosin (FLOMAX) 0.4 MG CAPS capsule Take 1 capsule (0.4 mg  total) by mouth daily. 30 capsule 3  . acetaminophen (TYLENOL) 500 MG tablet Take 500-1,000 mg by mouth daily as needed for mild pain.    . methocarbamol (ROBAXIN) 500 MG tablet Take 1 tablet (500 mg total) by mouth every 6 (six) hours as needed for muscle spasms. (Patient not taking: Reported on 11/19/2015) 60 tablet 1  . Multiple Vitamin (MULTIVITAMIN) tablet Take 1 tablet by mouth daily.    Marland Kitchen NASONEX 50 MCG/ACT nasal spray Place 2 sprays into the nose daily as needed (allergies).   0  . oxyCODONE-acetaminophen (PERCOCET/ROXICET) 5-325 MG tablet Take 1-2 tablets by mouth every 4 (four) hours as needed for moderate pain. (Patient not taking: Reported on 11/06/2015) 80 tablet 0  . ranitidine (ZANTAC) 150 MG tablet Take 150 mg by mouth 2 (two) times daily.    . traMADol (ULTRAM) 50 MG tablet Take 50 mg by mouth every 8 (eight) hours as needed.     No current facility-administered medications for this visit.     No Known Allergies  Family History  Problem Relation Age of Onset  . Lung cancer Father 54    deceased   . Cancer Father 49  LUNG  . Stroke Mother 63    DECEASED   . Hyperlipidemia Mother   . Heart attack Brother 36    deceased  . Diabetes Brother   . Liver cancer Sister 95    deceased   . Cancer Sister     Social History   Social History  . Marital status: Married    Spouse name: N/A  . Number of children: 2  . Years of education: N/A   Occupational History  . 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 Smoking status: Former Smoker    Quit date: 04/13/1974  . Smokeless tobacco: Former Systems developer    Quit date: 01/26/2002     Comment: He has smoked about 27-pack-year hx   . Alcohol use 1.2 oz/week    2 Cans of beer per week     Comment: He used to drink more heavily , but rarely drinks at the current time  . Drug use: No  . Sexual activity: Not on file   Other Topics Concern  . Not on file    Social History Narrative   LIVES IN [place] WITH HIS WIFE.   HE HAS 2 GROWN CHILDREN   HE IS RETIRED PRISON FIRM SUPERINTENDENT.   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   HE GOES TO THE [employer] 3 X WEEKLY.    Hospitiliaztions:  Health Maintenance:    Flu: Did complete last season  Tetanus: Unsure  Pneumovax: Completed in April 2017  Prevnar: Unsure  Zostavax: Never completed.  Colonoscopy: Never completed. Declines due to age.  Eye Doctor: Cataract surgery. Last eye exam March 2017  Dental Exam: False Teeth, no recent dental visit  PSA: Completed in August 2017.    Providers: Dr. Sharlett Iles, Dermatology; Dr. Vertell Limber, Neurosurgery; Dr. Scot Dock, Vascular; Dr. Jimmy Footman, Renal; Dr. Burt Knack, Cardiology; Alma Friendly, PCP; Dr. Wallace Going, Opthomology   I have personally reviewed and have noted: 1. The patient's medical and social history 2. Their use of alcohol, tobacco or illicit drugs 3. Their current medications and supplements 4. The patient's functional ability including ADL's, fall risks, home safety risks  and hearing or visual impairment. 5. Diet and physical activities 6. Evidence for depression or mood disorder  Subjective:   Review of Systems:   Constitutional: Denies fever, malaise, fatigue, headache or abrupt weight changes.  HEENT: Denies eye pain, eye redness, ear pain, ringing in the ears, wax buildup, runny nose, nasal congestion, bloody nose, or sore throat. Respiratory: Denies difficulty breathing, shortness of breath, cough or sputum production.   Cardiovascular: Denies chest pain, chest tightness, palpitations or swelling in the hands or feet.  Gastrointestinal: Denies abdominal pain, bloating, diarrhea, bloody stool. He has experienced constipation as a result of narcotics from a recent back operation. He is currently taking a stool softener.  GU: Denies urgency, frequency, pain with urination, burning sensation,  odor or discharge. He did experience hematuria 3 days ago, none this morning.  Musculoskeletal: Some decrease in ROM to lower back. Recent lower back surgery in April 2017. Currently following with Dr. Vertell Limber. He's currently exercising three days weekly.  Skin: Denies redness, rashes, lesions or ulcercations. Follows with dermatology, last visit several weeks ago. Neurological: Denies dizziness, difficulty with memory, difficulty with speech or problems with balance and coordination.   No other specific complaints in a complete review of  systems (except as listed in HPI above).  Objective:  PE:   BP 130/64   Pulse 99   Temp 97.7 F (36.5 C) (Oral)   Ht 6\' 2"  (1.88 m)   Wt 225 lb 6.4 oz (102.2 kg)   SpO2 94%   BMI 28.94 kg/m  Wt Readings from Last 3 Encounters:  11/19/15 225 lb 6.4 oz (102.2 kg)  11/06/15 235 lb 8 oz (106.8 kg)  08/08/15 243 lb 3 oz (110.3 kg)    General: Appears their stated age, well developed, well nourished in NAD. Skin: Warm, dry and intact. No rashes, lesions or ulcerations noted. HEENT: Head: normal shape and size; Eyes: sclera white, no icterus, conjunctiva pink, PERRLA and EOMs intact; Ears: Tm's gray and intact, normal light reflex; Nose: mucosa pink and moist, septum midline; Throat/Mouth: Teeth present, mucosa pink and moist, no exudate, lesions or ulcerations noted.  Neck: Normal range of motion. Neck supple, trachea midline. No massses, lumps or thyromegaly present.  Cardiovascular: Normal rate and rhythm. S1,S2 noted.  No murmur, rubs or gallops noted. No JVD or BLE edema. No carotid bruits noted. Pulmonary/Chest: Normal effort and positive vesicular breath sounds. No respiratory distress. No wheezes, rales or ronchi noted.  Abdomen: Soft and nontender. Normal bowel sounds, no bruits noted. No distention or masses noted. Liver, spleen and kidneys non palpable. Musculoskeletal: Normal range of motion. No signs of joint swelling. No difficulty with gait.   Neurological: Alert and oriented. Cranial nerves II-XII intact. Coordination normal. +DTRs bilaterally. Psychiatric: Mood and affect normal. Behavior is normal. Judgment and thought content normal.    BMET    Component Value Date/Time   NA 143 11/12/2015 0904   K 4.5 11/12/2015 0904   K 4.0 05/08/2014 1130   CL 107 11/12/2015 0904   CO2 30 11/12/2015 0904   GLUCOSE 97 11/12/2015 0904   BUN 14 11/12/2015 0904   CREATININE 1.27 11/12/2015 0904   CALCIUM 9.5 11/12/2015 0904   GFRNONAA 46 (L) 07/31/2015 1443   GFRAA 54 (L) 07/31/2015 1443    Lipid Panel     Component Value Date/Time   CHOL 160 11/12/2015 0904   TRIG 152.0 (H) 11/12/2015 0904   HDL 42.70 11/12/2015 0904   CHOLHDL 4 11/12/2015 0904   VLDL 30.4 11/12/2015 0904   LDLCALC 87 11/12/2015 0904    CBC    Component Value Date/Time   WBC 6.7 07/31/2015 1443   RBC 4.68 07/31/2015 1443   HGB 15.1 07/31/2015 1443   HCT 45.3 07/31/2015 1443   PLT 174 07/31/2015 1443   MCV 96.8 07/31/2015 1443   MCH 32.3 07/31/2015 1443   MCHC 33.3 07/31/2015 1443   RDW 12.8 07/31/2015 1443   LYMPHSABS 2.3 08/12/2010 1523   MONOABS 0.8 08/12/2010 1523   EOSABS 0.1 08/12/2010 1523   BASOSABS 0.0 08/12/2010 1523    Hgb A1C Lab Results  Component Value Date   HGBA1C 6.3 11/12/2015      Assessment and Plan:   Medicare Annual Wellness Visit:  Diet: He endorses a healthy diet: Breakfast: Cereal Lunch: Sandwich Dinner: Restaurants twice weekly, meat, vegetables, starch Snacks: None Desserts: None Beverages: Coke, some water, coffee Physical activity: He exercises at the [facility] three days weekly. Depression/mood screen: Negative Hearing: Intact to whispered voice Visual acuity: Grossly normal, performs annual eye exam  ADLs: Capable Fall risk: None Home safety: Good Cognitive evaluation: Intact to orientation, naming, recall and repetition EOL planning: Adv directives completed. No code.  Preventative Medicine:  Up-to-date on Pneumovax based off chart. He is unsure if he has received Prevnar and will check his records. Prescription printed for Zostavax for him to obtain at his local pharmacy. Declines colonoscopy due to age. PSA up-to-date and stable. He does have advanced directives. Overall feeling well with the exception of hematuria for the past 3 days. No fevers, urinary frequency, dysuria, abdominal pain. UA today with 1+ leuks, 3+ blood, negative nitrates. Will cover for likely bacterial involvement with Bactrim double strength tablets. Culture pending. Discussed importance of increasing consumption of vegetables, fruit, whole gr, lean protein. Commended him on his regular exercise. Exam unremarkable. Labs with prediabetes, overall stable.  Next appointment: Follow-up in 6 months for reevaluation.

## 2015-11-19 NOTE — Progress Notes (Signed)
Pre visit review using our clinic review tool, if applicable. No additional management support is needed unless otherwise documented below in the visit note. 

## 2015-11-19 NOTE — Assessment & Plan Note (Signed)
Underwent surgery in April 2017. Has noticed some constipation with pain medication and is managed on daily stool softener. Discussed risks of chronic opioid use. He will discuss this with his orthopedist.

## 2015-11-19 NOTE — Assessment & Plan Note (Signed)
Stable in the office today. Continue Toprol, amlodipine.

## 2015-11-19 NOTE — Assessment & Plan Note (Signed)
Overall stable. Continue Crestor 10 mg. LFTs within normal limits.

## 2015-11-19 NOTE — Patient Instructions (Addendum)
Take the shingles vaccination to the pharmacy and they will administer the vaccination.   Work to [employer] by increasing consumption of vegetables, fruit, lean protein, whole grains. Reduce fried foods, fatty foods, processed carbohydrates.   You have a urinary tract infection. Start Bactrim DS (sulfamethoxazole/trimethoprim) tablets for urinary tract infection. Take 1 tablet by mouth twice daily for 5 days.  Please notify me if you continue to notice blood in your urine after completion of antibiotics.   Follow up in 6 months for re-evaluation.  It was a pleasure to see you today!

## 2015-11-21 LAB — URINE CULTURE: Organism ID, Bacteria: NO GROWTH

## 2015-12-18 DIAGNOSIS — M25561 Pain in right knee: Secondary | ICD-10-CM | POA: Diagnosis not present

## 2016-01-20 DIAGNOSIS — M5416 Radiculopathy, lumbar region: Secondary | ICD-10-CM | POA: Diagnosis not present

## 2016-01-20 DIAGNOSIS — M4316 Spondylolisthesis, lumbar region: Secondary | ICD-10-CM | POA: Diagnosis not present

## 2016-01-20 DIAGNOSIS — Z6828 Body mass index (BMI) 28.0-28.9, adult: Secondary | ICD-10-CM | POA: Diagnosis not present

## 2016-01-20 DIAGNOSIS — M5126 Other intervertebral disc displacement, lumbar region: Secondary | ICD-10-CM | POA: Diagnosis not present

## 2016-01-20 DIAGNOSIS — M545 Low back pain: Secondary | ICD-10-CM | POA: Diagnosis not present

## 2016-01-25 ENCOUNTER — Other Ambulatory Visit: Payer: Self-pay | Admitting: Primary Care

## 2016-01-25 DIAGNOSIS — L03116 Cellulitis of left lower limb: Secondary | ICD-10-CM | POA: Diagnosis not present

## 2016-01-25 DIAGNOSIS — E785 Hyperlipidemia, unspecified: Secondary | ICD-10-CM

## 2016-01-28 ENCOUNTER — Other Ambulatory Visit: Payer: Self-pay | Admitting: Primary Care

## 2016-01-28 DIAGNOSIS — I10 Essential (primary) hypertension: Secondary | ICD-10-CM

## 2016-02-26 NOTE — Addendum Note (Signed)
Addended by: Thresa Ross C on: 02/26/2016 04:07 PM   Modules accepted: Orders

## 2016-03-24 DIAGNOSIS — I2699 Other pulmonary embolism without acute cor pulmonale: Secondary | ICD-10-CM | POA: Diagnosis not present

## 2016-03-24 DIAGNOSIS — N183 Chronic kidney disease, stage 3 (moderate): Secondary | ICD-10-CM | POA: Diagnosis not present

## 2016-03-24 DIAGNOSIS — E669 Obesity, unspecified: Secondary | ICD-10-CM | POA: Diagnosis not present

## 2016-03-24 DIAGNOSIS — I2510 Atherosclerotic heart disease of native coronary artery without angina pectoris: Secondary | ICD-10-CM | POA: Diagnosis not present

## 2016-03-24 DIAGNOSIS — I701 Atherosclerosis of renal artery: Secondary | ICD-10-CM | POA: Diagnosis not present

## 2016-03-24 DIAGNOSIS — I129 Hypertensive chronic kidney disease with stage 1 through stage 4 chronic kidney disease, or unspecified chronic kidney disease: Secondary | ICD-10-CM | POA: Diagnosis not present

## 2016-03-24 DIAGNOSIS — D631 Anemia in chronic kidney disease: Secondary | ICD-10-CM | POA: Diagnosis not present

## 2016-03-24 DIAGNOSIS — E785 Hyperlipidemia, unspecified: Secondary | ICD-10-CM | POA: Diagnosis not present

## 2016-03-24 DIAGNOSIS — I679 Cerebrovascular disease, unspecified: Secondary | ICD-10-CM | POA: Diagnosis not present

## 2016-03-24 DIAGNOSIS — N2581 Secondary hyperparathyroidism of renal origin: Secondary | ICD-10-CM | POA: Diagnosis not present

## 2016-04-27 ENCOUNTER — Other Ambulatory Visit: Payer: Self-pay | Admitting: Cardiovascular Disease

## 2016-04-27 DIAGNOSIS — M5416 Radiculopathy, lumbar region: Secondary | ICD-10-CM | POA: Diagnosis not present

## 2016-04-28 DIAGNOSIS — D2272 Melanocytic nevi of left lower limb, including hip: Secondary | ICD-10-CM | POA: Diagnosis not present

## 2016-04-28 DIAGNOSIS — X32XXXA Exposure to sunlight, initial encounter: Secondary | ICD-10-CM | POA: Diagnosis not present

## 2016-04-28 DIAGNOSIS — D2261 Melanocytic nevi of right upper limb, including shoulder: Secondary | ICD-10-CM | POA: Diagnosis not present

## 2016-04-28 DIAGNOSIS — L570 Actinic keratosis: Secondary | ICD-10-CM | POA: Diagnosis not present

## 2016-04-28 DIAGNOSIS — Z85828 Personal history of other malignant neoplasm of skin: Secondary | ICD-10-CM | POA: Diagnosis not present

## 2016-04-28 DIAGNOSIS — D225 Melanocytic nevi of trunk: Secondary | ICD-10-CM | POA: Diagnosis not present

## 2016-05-07 ENCOUNTER — Ambulatory Visit (INDEPENDENT_AMBULATORY_CARE_PROVIDER_SITE_OTHER): Payer: Medicare Other | Admitting: Cardiovascular Disease

## 2016-05-07 ENCOUNTER — Encounter: Payer: Self-pay | Admitting: Cardiovascular Disease

## 2016-05-07 VITALS — BP 128/58 | HR 86 | Ht 74.0 in | Wt 243.8 lb

## 2016-05-07 DIAGNOSIS — E782 Mixed hyperlipidemia: Secondary | ICD-10-CM | POA: Diagnosis not present

## 2016-05-07 DIAGNOSIS — I2510 Atherosclerotic heart disease of native coronary artery without angina pectoris: Secondary | ICD-10-CM | POA: Diagnosis not present

## 2016-05-07 DIAGNOSIS — I10 Essential (primary) hypertension: Secondary | ICD-10-CM

## 2016-05-07 NOTE — Progress Notes (Signed)
Cardiology Office Note Date:  05/07/2016   ID:  Leonard [name], DOB 01/19/1930, MRN [id number]  PCP:  Sheral Flow, NP  Cardiologist:  [name], MD    Chief Complaint  Patient presents with  . Follow-up    1 year     History of Present Illness: Leonard GOLLIDAY Sr. is a 83 y.o. male who presents for Follow-up of coronary artery disease status post CABG. The patient's bypass surgery was in 2011. He also has peripheral arterial disease. He underwent renal stenting in 2012. He had bilateral carotid endarterectomies in 2013. The patient has a history every current pulmonary embolus and he is now maintained on long-term anticoagulation.  The patient is here alone today.the patient has no cardiac complaints.He had back surgery last year and has not had any significant improvement in his pain or mobility. He has given half of his land to his children but he still has 125 acres. He bought a new Gator this year. He denies any chest pain, chest pressure, or shortness of breath. He is tolerating oral anticoagulation without bleeding problems. He denies edema, orthopnea, or PND. He reports no changes in his medications.   Past Medical History:  Diagnosis Date  . Anemia   . Arrhythmia    PAROXYSMAL ATRIAL FIBRILLATION  . Arthritis    OSTEOARTHRITIS  . Cataracts, bilateral   . Chronic back pain   . Coronary artery disease 8/11   CABG...LM EMERGENT WITH IABP sees Dr. Legrand Como Meriam Chojnowski  . DJD (degenerative joint disease)   . DVT (deep venous thrombosis) ([place])   . GERD (gastroesophageal reflux disease)   . History of BPH   . Hyperlipidemia   . Hypertension   . Hypothyroidism   . Myocardial infarction   . Pulmonary embolism ([place]) 2009   hx of  . RBBB (right bundle branch block)   . Renal artery stenosis ([place])   . Thrombocytopenia ([place])   . Thyroid disease    HYPOTHYROIDISM  . Unstable angina ([place])    NONE IN LONG TIME    Past Surgical History:  Procedure  Laterality Date  . Blood clot  Feb.20,  2016   Pulmonary Embolism  . CAROTID ENDARTERECTOMY Right Oct. 17, 2013   CE  . CAROTID ENDARTERECTOMY Left Dec. 3, 2016   CE  . CATARACT EXTRACTION  05/2014,06/2014  . CORONARY ARTERY BYPASS GRAFT  11-21-09   EMERGENT X 3 GRAFTING. ..[name], MD, cc: Leonard Berg  . ENDARTERECTOMY  01/28/2012   Procedure: ENDARTERECTOMY CAROTID;  Surgeon: [name], MD;  Location: [facility];  Service: Vascular;  Laterality: Right;  with Primary Closure of Artery  . ENDARTERECTOMY  03/15/2012   Procedure: ENDARTERECTOMY CAROTID;  Surgeon: [name], MD;  Location: [facility];  Service: Vascular;  Laterality: Left;  . FOOT SURGERY    . MANDIBLE SURGERY    . MAXIMUM ACCESS (MAS)POSTERIOR LUMBAR INTERBODY FUSION (PLIF) 2 LEVEL N/A 08/08/2015   Procedure: Lumbar Four-Five, Lumbar Five-Sacral One Maximum access posterior lumbar interbody fusion;  Surgeon: [name], MD;  Location: [facility];  Service: Neurosurgery;  Laterality: N/A;  LUMBAR FOUR-FIVE ,LUMBAR FIVE -SACRAL Maximum access posterior lumbar interbody fusion  . RENAL ARTERY STENT  2010  . REPLACEMENT TOTAL KNEE  2006   RIGHT  . TOTAL HIP ARTHROPLASTY     right    Current Outpatient Prescriptions  Medication Sig Dispense Refill  . acetaminophen (TYLENOL) 500 MG tablet Take 500-1,000 mg by mouth  daily as needed for mild pain.    Marland Kitchen amLODipine (NORVASC) 5 MG tablet Take 1 tablet (5 mg total) by mouth daily. 90 tablet 3  . aspirin 81 MG tablet Take 81 mg by mouth daily.      . cetirizine (ZYRTEC) 10 MG tablet Take 10 mg by mouth daily.    Mariane Baumgarten Calcium (STOOL SOFTENER PO) Take 2 tablets by mouth daily as needed.    . furosemide (LASIX) 40 MG tablet TAKE 1 TABLET BY MOUTH ONCE A DAY 90 tablet 0  . levothyroxine (SYNTHROID, LEVOTHROID) 125 MCG tablet TAKE 1 TABLET BY MOUTH ONCE A DAY 90 tablet 1  . metoprolol succinate (TOPROL-XL) 25 MG 24 hr tablet TAKE 1 TABLET BY MOUTH ONCE  DAILY 90 tablet 1  . Multiple Vitamin (MULTIVITAMIN) tablet Take 1 tablet by mouth daily.    Marland Kitchen NASONEX 50 MCG/ACT nasal spray Place 2 sprays into the nose daily as needed (allergies).   0  . ranitidine (ZANTAC) 150 MG tablet Take 150 mg by mouth 2 (two) times daily.    . rivaroxaban (XARELTO) 20 MG TABS tablet Take 20 mg by mouth daily with supper.    . rosuvastatin (CRESTOR) 10 MG tablet TAKE 1 TABLET BY MOUTH AT BEDTIME 30 tablet 5  . Sennosides-Docusate Sodium (SENNA PLUS PO) Take 2 tablets by mouth daily as needed.    . tamsulosin (FLOMAX) 0.4 MG CAPS capsule TAKE 1 CAPSULE BY MOUTH ONCE DAILY 30 capsule 5  . traMADol (ULTRAM) 50 MG tablet Take 50 mg by mouth every 8 (eight) hours as needed (pain).      No current facility-administered medications for this visit.     Allergies:   Patient has no known allergies.   Social History:  The patient  reports that he quit smoking about 42 years ago. He quit smokeless tobacco use about 14 years ago. He reports that he drinks about 1.2 oz of alcohol per week . He reports that he does not use drugs.   Family History:  The patient's  family history includes Cancer in his sister; Cancer (age of onset: 41) in his father; Diabetes in his brother; Heart attack (age of onset: 31) in his brother; Hyperlipidemia in his mother; Liver cancer (age of onset: 61) in his sister; Lung cancer (age of onset: 74) in his father; Stroke (age of onset: 44) in his mother.    ROS:  Please see the history of present illness.  All other systems are reviewed and negative.    PHYSICAL EXAM: VS:  BP (!) 128/58   Pulse 86   Ht 6\' 2"  (1.88 m)   Wt 243 lb 12.8 oz (110.6 kg)   BMI 31.30 kg/m  , BMI Body mass index is 31.3 kg/m. GEN: Well nourished, well developed, in no acute distress  HEENT: normal  Neck: no JVD, no masses. No carotid bruits Cardiac: RRR without murmur or gallop                Respiratory:  clear to auscultation bilaterally, normal work of  breathing GI: soft, nontender, nondistended, + BS MS: no deformity or atrophy  Ext: no pretibial edema, pedal pulses 2+= bilaterally Skin: warm and dry, no rash Neuro:  Strength and sensation are intact Psych: euthymic mood, full affect  EKG:  EKG is ordered today. The ekg ordered today shows NSR with RBBB, possible age-indeterminate inferior MI  Recent Labs: 07/31/2015: Hemoglobin 15.1; Platelets 174 11/12/2015: ALT 15; BUN 14; Creatinine, Ser  1.27; Potassium 4.5; Sodium 143; TSH 2.49   Lipid Panel     Component Value Date/Time   CHOL 160 11/12/2015 0904   TRIG 152.0 (H) 11/12/2015 0904   HDL 42.70 11/12/2015 0904   CHOLHDL 4 11/12/2015 0904   VLDL 30.4 11/12/2015 0904   LDLCALC 87 11/12/2015 0904   LDLDIRECT 100.8 08/12/2010 1523      Wt Readings from Last 3 Encounters:  05/07/16 243 lb 12.8 oz (110.6 kg)  11/19/15 225 lb 6.4 oz (102.2 kg)  11/06/15 235 lb 8 oz (106.8 kg)     ASSESSMENT AND PLAN: 1.  CAD, native vessel, without symptoms of angina: stable on ASA, a beta-blocker, and a statin drug  2. Bilateral carotid artery stenosis without history of stroke: followed by [facility]. Notes reviewed.   3. Renal artery stenosis status post renal artery stenting: stable BP on current Rx.  4. Hypertension with chronic kidney disease: BP well controlled. Renal function stable. 2017 labs reviewed.   5. Hyperlipidemia: on a statin drug, crestor 10 mg. Lipids reviewed and at goal.   Current medicines are reviewed with the patient today.  The patient does not have concerns regarding medicines.  Labs/ tests ordered today include:   Orders Placed This Encounter  Procedures  . EKG 12-Lead    Disposition:   FU one year  Signed, [name], MD  05/07/2016 10:12 AM    [facility], [place], [place]  [address] Phone: [phone]; Fax: [phone]

## 2016-05-07 NOTE — Patient Instructions (Signed)

## 2016-05-20 DIAGNOSIS — I10 Essential (primary) hypertension: Secondary | ICD-10-CM | POA: Diagnosis not present

## 2016-05-20 DIAGNOSIS — Z6829 Body mass index (BMI) 29.0-29.9, adult: Secondary | ICD-10-CM | POA: Diagnosis not present

## 2016-05-20 DIAGNOSIS — M5416 Radiculopathy, lumbar region: Secondary | ICD-10-CM | POA: Diagnosis not present

## 2016-05-21 ENCOUNTER — Other Ambulatory Visit: Payer: Self-pay | Admitting: Primary Care

## 2016-05-21 NOTE — Telephone Encounter (Signed)
Ok to refill? Electronically refill request for NASONEX 50 MCG/ACT nasal spray. Medication have not been prescribed by Anda Kraft. Last seen on 05/07/2016.

## 2016-06-01 ENCOUNTER — Other Ambulatory Visit: Payer: Self-pay | Admitting: Primary Care

## 2016-06-01 DIAGNOSIS — I10 Essential (primary) hypertension: Secondary | ICD-10-CM

## 2016-06-02 ENCOUNTER — Telehealth: Payer: Self-pay

## 2016-06-02 NOTE — Telephone Encounter (Addendum)
Noted. Prior authorization was done on 05/25/2016 through Cover My Meds.

## 2016-06-02 NOTE — Telephone Encounter (Signed)
[employer] left v/m prior auth approved for mometasone furoate until 06/02/2017.

## 2016-06-09 NOTE — Telephone Encounter (Signed)
received mailed letter that prior Leonard Berg has been approved until 06/02/2017.  PA# [id number]

## 2016-06-16 DIAGNOSIS — M545 Low back pain: Secondary | ICD-10-CM | POA: Diagnosis not present

## 2016-06-16 DIAGNOSIS — M5416 Radiculopathy, lumbar region: Secondary | ICD-10-CM | POA: Diagnosis not present

## 2016-06-16 DIAGNOSIS — M4316 Spondylolisthesis, lumbar region: Secondary | ICD-10-CM | POA: Diagnosis not present

## 2016-06-16 DIAGNOSIS — M5126 Other intervertebral disc displacement, lumbar region: Secondary | ICD-10-CM | POA: Diagnosis not present

## 2016-06-29 ENCOUNTER — Other Ambulatory Visit: Payer: Self-pay | Admitting: Cardiovascular Disease

## 2016-06-29 NOTE — Telephone Encounter (Signed)
Received request for Xarelto 20mg ; last seen by Dr. Burt Knack on 05/07/16, Crea-1.27 on 11/12/15, wt-110.6kg, Age- 83 yrs old, [place].65ml/min. Will send in refill as requested.

## 2016-07-14 DIAGNOSIS — I10 Essential (primary) hypertension: Secondary | ICD-10-CM | POA: Diagnosis not present

## 2016-07-14 DIAGNOSIS — M5126 Other intervertebral disc displacement, lumbar region: Secondary | ICD-10-CM | POA: Diagnosis not present

## 2016-07-14 DIAGNOSIS — M545 Low back pain: Secondary | ICD-10-CM | POA: Diagnosis not present

## 2016-07-14 DIAGNOSIS — M4316 Spondylolisthesis, lumbar region: Secondary | ICD-10-CM | POA: Diagnosis not present

## 2016-07-15 IMAGING — RF DG C-ARM 61-120 MIN
1 series · 3 of 3 positions shown · non-contrast
Comparison: MRI lumbar spine 06/13/2015

CLINICAL DATA: L4-5 and L5-S1 PLIF.

EXAM:
DG C-ARM 61-120 MIN; LUMBAR SPINE - 2-3 VIEW

[Series 1: run · 3 of 3 slices shown]
[im 1/3]
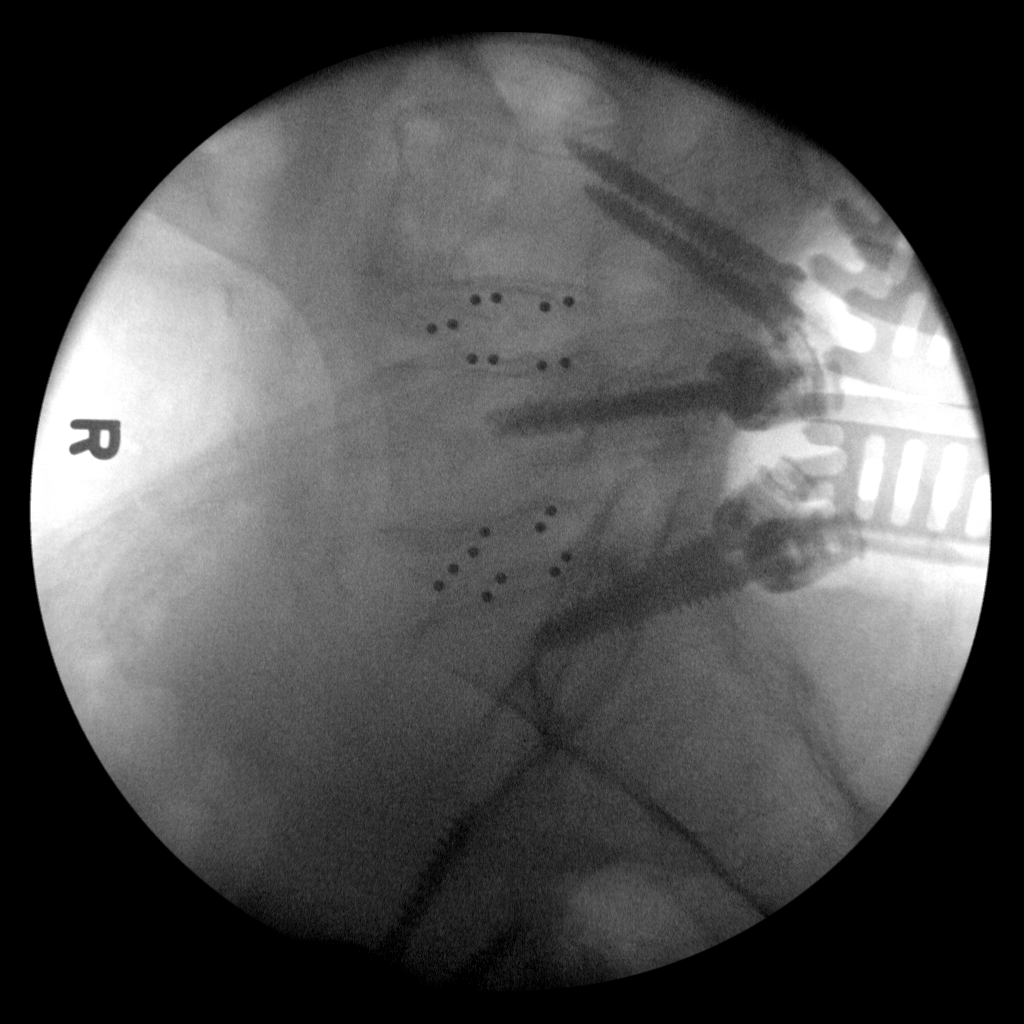
[im 2/3]
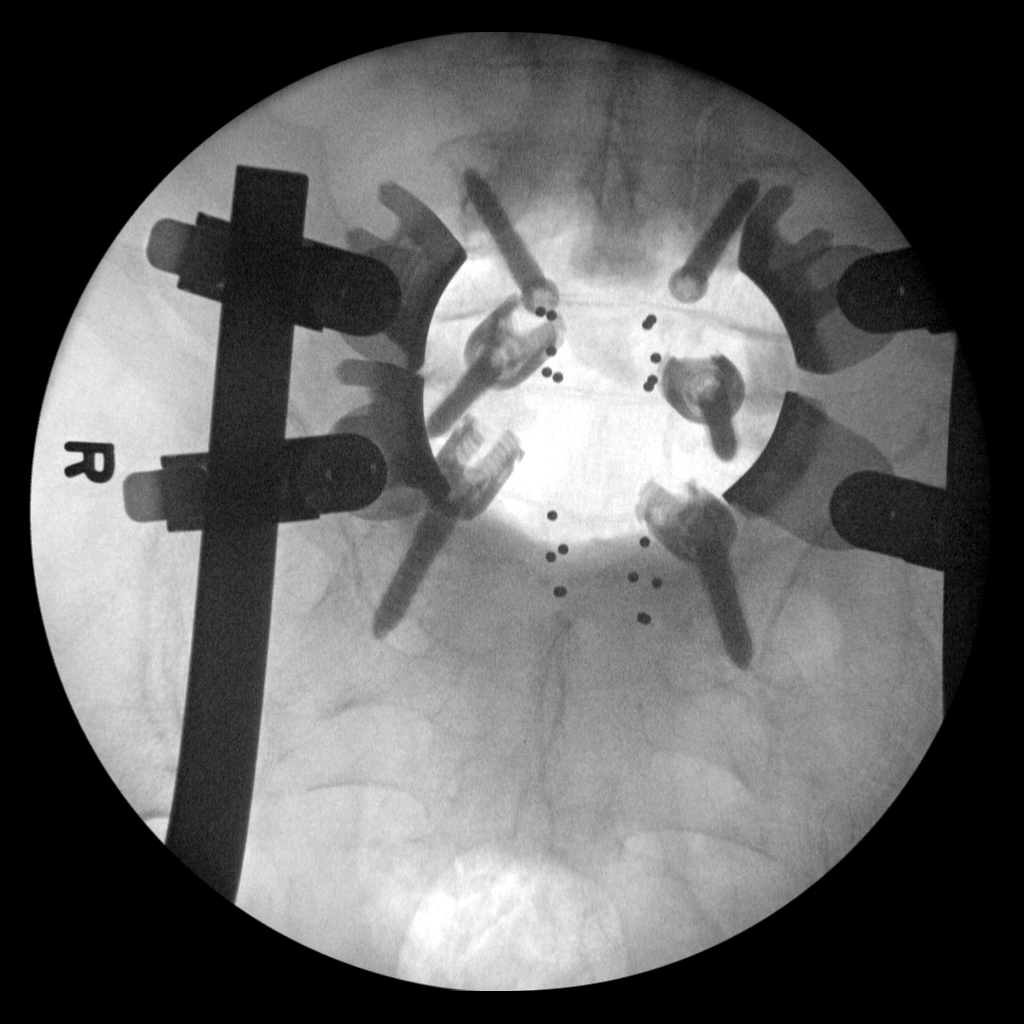
[im 3/3]
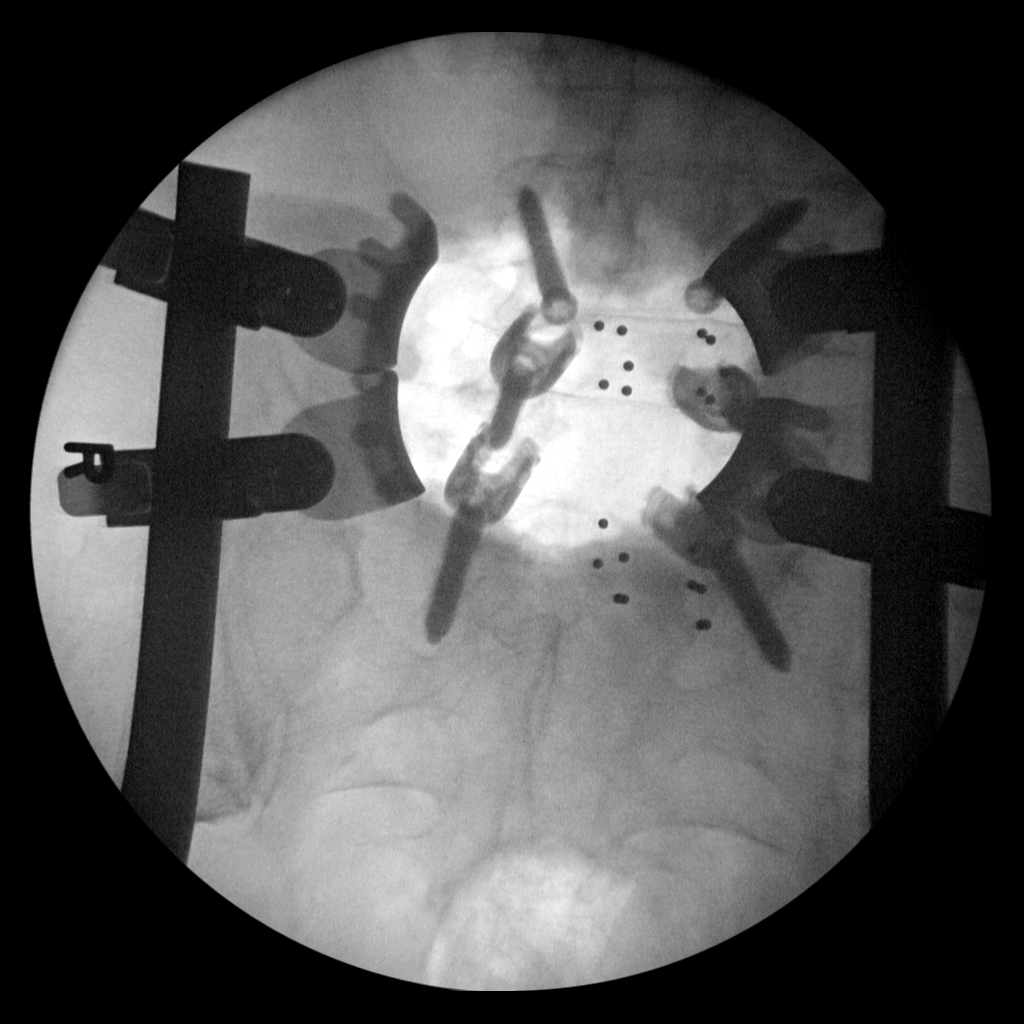

[3 of 3 positions shown; findings below may reference images not displayed]

FINDINGS: Three intraoperative fluoroscopic images were submitted
demonstrating transpedicular screws at the L4 - L5 and L5 - S1
levels. The L4 transpedicular screws are oriented somewhat caudally.
Intervertebral body spacers at the L4-5 and L5-S1 levels.
IMPRESSION: 3 intraoperative fluoroscopic images submitted for patient
undergoing L4-5 and L5-S1 PLIF.

## 2016-07-21 DIAGNOSIS — Z961 Presence of intraocular lens: Secondary | ICD-10-CM | POA: Diagnosis not present

## 2016-07-27 ENCOUNTER — Other Ambulatory Visit: Payer: Self-pay | Admitting: Primary Care

## 2016-07-27 DIAGNOSIS — E785 Hyperlipidemia, unspecified: Secondary | ICD-10-CM

## 2016-07-27 DIAGNOSIS — I10 Essential (primary) hypertension: Secondary | ICD-10-CM

## 2016-08-05 DIAGNOSIS — R0602 Shortness of breath: Secondary | ICD-10-CM | POA: Diagnosis not present

## 2016-08-05 DIAGNOSIS — M4316 Spondylolisthesis, lumbar region: Secondary | ICD-10-CM | POA: Diagnosis not present

## 2016-08-05 DIAGNOSIS — M545 Low back pain: Secondary | ICD-10-CM | POA: Diagnosis not present

## 2016-08-05 DIAGNOSIS — R600 Localized edema: Secondary | ICD-10-CM | POA: Diagnosis not present

## 2016-08-06 ENCOUNTER — Encounter: Payer: Self-pay | Admitting: Primary Care

## 2016-08-06 ENCOUNTER — Ambulatory Visit (INDEPENDENT_AMBULATORY_CARE_PROVIDER_SITE_OTHER): Payer: Medicare Other | Admitting: Primary Care

## 2016-08-06 VITALS — BP 126/70 | HR 76 | Temp 97.8°F | Ht 74.0 in | Wt 232.4 lb

## 2016-08-06 DIAGNOSIS — M79605 Pain in left leg: Secondary | ICD-10-CM | POA: Diagnosis not present

## 2016-08-06 DIAGNOSIS — M79604 Pain in right leg: Secondary | ICD-10-CM

## 2016-08-06 DIAGNOSIS — R600 Localized edema: Secondary | ICD-10-CM | POA: Diagnosis not present

## 2016-08-06 DIAGNOSIS — R0602 Shortness of breath: Secondary | ICD-10-CM

## 2016-08-06 NOTE — Progress Notes (Signed)
Pre visit review using our clinic review tool, if applicable. No additional management support is needed unless otherwise documented below in the visit note. 

## 2016-08-06 NOTE — Progress Notes (Signed)
Subjective:    Patient ID: Leonard [name], male    DOB: March 11, 1930, 83 y.o.   MRN: [id number]  HPI  Mr. Brayten Komar is an 83 year old male with a history of spondylolisthesis of lumbar spine, CAD, atrial fibrillation, decreased renal function who presents today with a chief complaint of lower extremity pain.   He was evaluated by his neurosurgeon yesterday who referred him further evaluation of tingling and pain to his bilateral lower extremities. His tingling and burning symptoms occur mostly at night, but also during the day.   He's also noticed mild swelling to his lower extremities when wearing tight socks, this has been present for about 6 months. He will experience shortness of breath with changing clothes at the [facility] before he gets into the pool. He will experience shortness of breath at home when walking to the mailbox, this has been worse over the past several weeks. He's also been coughing for the past 3-4 months.    He denies chest pain, palpitations, fevers, fatigue, wheezing, calf swelling, erythema of skin. He last visited with his cardiologist in January 2018.  Review of Systems  Constitutional: Negative for chills, fatigue and fever.  HENT: Negative for congestion.   Respiratory: Positive for cough and shortness of breath. Negative for wheezing.   Cardiovascular: Positive for leg swelling. Negative for chest pain and palpitations.  Musculoskeletal: Positive for back pain.       Past Medical History:  Diagnosis Date  . Anemia   . Arrhythmia    PAROXYSMAL ATRIAL FIBRILLATION  . Arthritis    OSTEOARTHRITIS  . Cataracts, bilateral   . Chronic back pain   . Coronary artery disease 8/11   CABG...LM EMERGENT WITH IABP sees Dr. Legrand Como cooper  . DJD (degenerative joint disease)   . DVT (deep venous thrombosis) ([place])   . GERD (gastroesophageal reflux disease)   . History of BPH   . Hyperlipidemia   . Hypertension   . Hypothyroidism   . Myocardial infarction  ([place])   . Pulmonary embolism ([place]) 2009   hx of  . RBBB (right bundle branch block)   . Renal artery stenosis ([place])   . Thrombocytopenia ([place])   . Thyroid disease    HYPOTHYROIDISM  . Unstable angina ([place])    NONE IN LONG TIME     Social History   Social History  . Marital status: Married    Spouse name: N/A  . Number of children: 2  . Years of education: N/A   Occupational History  . 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 Smoking status: Former Smoker    Quit date: 04/13/1974  . Smokeless tobacco: Former Systems developer    Quit date: 01/26/2002     Comment: He has smoked about 27-pack-year hx   . Alcohol use 1.2 oz/week    2 Cans of beer per week     Comment: He used to drink more heavily , but rarely drinks at the current time  . Drug use: No  . Sexual activity: Not on file   Other Topics Concern  . Not on file   Social History Narrative   LIVES IN [place] WITH HIS WIFE.   HE HAS 2 GROWN CHILDREN   HE IS RETIRED PRISON FIRM SUPERINTENDENT.   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  HE GOES TO THE [employer] 3 X WEEKLY.    Past Surgical History:  Procedure Laterality Date  . Blood clot  Feb.20,  2016   Pulmonary Embolism  . CAROTID ENDARTERECTOMY Right Oct. 17, 2013   CE  . CAROTID ENDARTERECTOMY Left Dec. 3, 2016   CE  . CATARACT EXTRACTION  05/2014,06/2014  . CORONARY ARTERY BYPASS GRAFT  11-21-09   EMERGENT X 3 GRAFTING. ..[name], MD, cc: [name]  . ENDARTERECTOMY  01/28/2012   Procedure: ENDARTERECTOMY CAROTID;  Surgeon: [name], MD;  Location: [facility];  Service: Vascular;  Laterality: Right;  with Primary Closure of Artery  . ENDARTERECTOMY  03/15/2012   Procedure: ENDARTERECTOMY CAROTID;  Surgeon: [name], MD;  Location: [place] [facility];  Service: Vascular;  Laterality: Left;  . FOOT SURGERY    . MANDIBLE SURGERY    .  MAXIMUM ACCESS (MAS)POSTERIOR LUMBAR INTERBODY FUSION (PLIF) 2 LEVEL N/A 08/08/2015   Procedure: Lumbar Four-Five, Lumbar Five-Sacral One Maximum access posterior lumbar interbody fusion;  Surgeon: [name], MD;  Location: [facility];  Service: Neurosurgery;  Laterality: N/A;  LUMBAR FOUR-FIVE ,LUMBAR FIVE -SACRAL Maximum access posterior lumbar interbody fusion  . RENAL ARTERY STENT  2010  . REPLACEMENT TOTAL KNEE  2006   RIGHT  . TOTAL HIP ARTHROPLASTY     right    Family History  Problem Relation Age of Onset  . Lung cancer Father 102    deceased   . Cancer Father 66    LUNG  . Stroke Mother 23    DECEASED   . Hyperlipidemia Mother   . Heart attack Brother 8    deceased  . Diabetes Brother   . Liver cancer Sister 24    deceased   . Cancer Sister     No Known Allergies  Current Outpatient Prescriptions on File Prior to Visit  Medication Sig Dispense Refill  . acetaminophen (TYLENOL) 500 MG tablet Take 500-1,000 mg by mouth daily as needed for mild pain.    Marland Kitchen amLODipine (NORVASC) 5 MG tablet TAKE 1 TABLET BY MOUTH ONCE A DAY 30 tablet 5  . aspirin 81 MG tablet Take 81 mg by mouth daily.      . cetirizine (ZYRTEC) 10 MG tablet Take 10 mg by mouth daily.    Mariane Baumgarten Calcium (STOOL SOFTENER PO) Take 2 tablets by mouth daily as needed.    . furosemide (LASIX) 40 MG tablet TAKE 1 TABLET BY MOUTH ONCE A DAY 90 tablet 0  . levothyroxine (SYNTHROID, LEVOTHROID) 125 MCG tablet TAKE 1 TABLET BY MOUTH ONCE A DAY 90 tablet 1  . metoprolol succinate (TOPROL-XL) 25 MG 24 hr tablet TAKE 1 TABLET BY MOUTH ONCE DAILY 90 tablet 1  . Multiple Vitamin (MULTIVITAMIN) tablet Take 1 tablet by mouth daily.    Marland Kitchen NASONEX 50 MCG/ACT nasal spray USE 2 SPRAYS IN EACH NOSTRIL ONCE A DAY 17 g 5  . ranitidine (ZANTAC) 150 MG tablet Take 150 mg by mouth 2 (two) times daily.    . rivaroxaban (XARELTO) 20 MG TABS tablet Take 20 mg by mouth daily with supper.    . rosuvastatin (CRESTOR) 10 MG tablet  TAKE 1 TABLET BY MOUTH EVERY NIGHT AT BEDTIME 30 tablet 5  . tamsulosin (FLOMAX) 0.4 MG CAPS capsule TAKE 1 CAPSULE BY MOUTH ONCE DAILY 30 capsule 5  . XARELTO 20 MG TABS tablet TAKE 1 TABLET BY MOUTH ONCE A DAY 30 tablet 5   No  current facility-administered medications on file prior to visit.     BP 126/70   Pulse 76   Temp 97.8 F (36.6 C) (Oral)   Ht 6\' 2"  (1.88 m)   Wt 232 lb 6.4 oz (105.4 kg)   SpO2 94%   BMI 29.84 kg/m    Objective:   Physical Exam  Constitutional: He appears well-nourished. He does not appear ill.  Neck: Neck supple.  Cardiovascular: Normal rate and regular rhythm.   Mild lower extremity edema with trace pitting. Negative Homan's sign.  Pulmonary/Chest: Effort normal and breath sounds normal. He has no decreased breath sounds. He has no wheezes. He has no rhonchi.  Skin: Skin is warm and dry.          Assessment & Plan:  Lower Extremity Pain and Edema:  Also with other symptoms as noted above. Edema and cough concerning for decrease in overall heart function. Lower extremity pain concerning for vascular problem, could also be coming from chronic low back pain. Will repeat echocardiogram and order ABI's for further evaluation. Cough does not appear allergy/viral/bacterial, lungs clear. Consider COPD if echo unchanged. Low suspicion for DVT based off of examination. Discussed to become more active as edema could be secondary to sedentary lifestyle. Discussed compression socks PRN. Await results.  Sheral Flow, NP

## 2016-08-06 NOTE — Patient Instructions (Signed)
Stop by the front desk and speak with either Rosaria Ferries or Shirlean Mylar regarding your echocardiogram and your leg dopplers. We will be in touch with you once we receive these results.  Try to get up from the recliner once every hour and remain active for at least 10 minutes.  It was a pleasure to see you today!

## 2016-08-18 ENCOUNTER — Other Ambulatory Visit (HOSPITAL_COMMUNITY): Payer: Medicare Other

## 2016-08-24 ENCOUNTER — Ambulatory Visit (HOSPITAL_COMMUNITY)
Admission: RE | Admit: 2016-08-24 | Discharge: 2016-08-24 | Disposition: A | Discharge status: Home / Self Care | Payer: Medicare Other | Source: Ambulatory Visit | Attending: Internal Medicine | Admitting: Internal Medicine

## 2016-08-24 DIAGNOSIS — M79604 Pain in right leg: Secondary | ICD-10-CM | POA: Insufficient documentation

## 2016-08-24 DIAGNOSIS — Z951 Presence of aortocoronary bypass graft: Secondary | ICD-10-CM | POA: Diagnosis not present

## 2016-08-24 DIAGNOSIS — Z87891 Personal history of nicotine dependence: Secondary | ICD-10-CM | POA: Diagnosis not present

## 2016-08-24 DIAGNOSIS — I2510 Atherosclerotic heart disease of native coronary artery without angina pectoris: Secondary | ICD-10-CM | POA: Diagnosis not present

## 2016-08-24 DIAGNOSIS — E785 Hyperlipidemia, unspecified: Secondary | ICD-10-CM | POA: Diagnosis not present

## 2016-08-24 DIAGNOSIS — M79605 Pain in left leg: Secondary | ICD-10-CM | POA: Diagnosis not present

## 2016-08-24 DIAGNOSIS — I10 Essential (primary) hypertension: Secondary | ICD-10-CM | POA: Insufficient documentation

## 2016-08-24 DIAGNOSIS — I739 Peripheral vascular disease, unspecified: Secondary | ICD-10-CM | POA: Diagnosis not present

## 2016-08-25 DIAGNOSIS — L97509 Non-pressure chronic ulcer of other part of unspecified foot with unspecified severity: Secondary | ICD-10-CM | POA: Diagnosis not present

## 2016-08-27 ENCOUNTER — Other Ambulatory Visit: Payer: Self-pay | Admitting: Primary Care

## 2016-08-27 ENCOUNTER — Other Ambulatory Visit: Payer: Self-pay | Admitting: Internal Medicine

## 2016-08-27 DIAGNOSIS — I7409 Other arterial embolism and thrombosis of abdominal aorta: Secondary | ICD-10-CM

## 2016-08-27 DIAGNOSIS — I779 Disorder of arteries and arterioles, unspecified: Secondary | ICD-10-CM

## 2016-08-28 ENCOUNTER — Other Ambulatory Visit: Payer: Self-pay

## 2016-08-28 ENCOUNTER — Ambulatory Visit (HOSPITAL_COMMUNITY): Payer: Medicare Other | Attending: Cardiology

## 2016-08-28 DIAGNOSIS — Z951 Presence of aortocoronary bypass graft: Secondary | ICD-10-CM | POA: Insufficient documentation

## 2016-08-28 DIAGNOSIS — E785 Hyperlipidemia, unspecified: Secondary | ICD-10-CM | POA: Insufficient documentation

## 2016-08-28 DIAGNOSIS — I10 Essential (primary) hypertension: Secondary | ICD-10-CM | POA: Diagnosis not present

## 2016-08-28 DIAGNOSIS — I2510 Atherosclerotic heart disease of native coronary artery without angina pectoris: Secondary | ICD-10-CM | POA: Diagnosis not present

## 2016-08-28 DIAGNOSIS — R0602 Shortness of breath: Secondary | ICD-10-CM

## 2016-08-28 DIAGNOSIS — I252 Old myocardial infarction: Secondary | ICD-10-CM | POA: Insufficient documentation

## 2016-08-28 DIAGNOSIS — I4510 Unspecified right bundle-branch block: Secondary | ICD-10-CM | POA: Diagnosis not present

## 2016-08-28 DIAGNOSIS — R600 Localized edema: Secondary | ICD-10-CM | POA: Diagnosis not present

## 2016-09-04 ENCOUNTER — Other Ambulatory Visit: Payer: Self-pay | Admitting: Cardiovascular Disease

## 2016-09-14 ENCOUNTER — Encounter: Payer: Self-pay | Admitting: Vascular Surgery

## 2016-09-15 ENCOUNTER — Encounter (INDEPENDENT_AMBULATORY_CARE_PROVIDER_SITE_OTHER): Payer: Self-pay | Admitting: Vascular Surgery

## 2016-09-16 ENCOUNTER — Ambulatory Visit (INDEPENDENT_AMBULATORY_CARE_PROVIDER_SITE_OTHER): Payer: Medicare Other | Admitting: Vascular Surgery

## 2016-09-16 ENCOUNTER — Encounter: Payer: Self-pay | Admitting: Vascular Surgery

## 2016-09-16 VITALS — BP 119/67 | HR 75 | Resp 20 | Ht 74.0 in | Wt 234.5 lb

## 2016-09-16 DIAGNOSIS — Z9889 Other specified postprocedural states: Secondary | ICD-10-CM | POA: Diagnosis not present

## 2016-09-16 DIAGNOSIS — I739 Peripheral vascular disease, unspecified: Secondary | ICD-10-CM

## 2016-09-16 NOTE — Progress Notes (Signed)
Patient name: Leonard TALARICO Sr. MRN: [id number] DOB: 1930/04/07 Sex: male   REASON FOR CONSULT:    Bilateral leg pain. The consult is requested by [facility], Dr. Carlis Abbott.  HPI:   Leonard GALICIA Sr. is a 83 y.o. male, who I performed bilateral carotid endarterectomies on the patient in 2013. The patient had a right carotid endarterectomy in October, and a left carotid endarterectomy in December. The most recent carotid duplex scan in July 2017 showed no evidence of recurrent stenosis on either side.  He is referred now with bilateral lower extremity pain. He tells me that his legs "give out on him in "when he is ambulating. This does not occur when he is simply standing or sitting. He does describe some hip and thigh claudication but no calf claudication. He denies any history of rest pain or nonhealing ulcers.  His risk factors for peripheral vascular disease include hypertension, diabetes, and a remote history of tobacco use. He denies any history of diabetes or family history of premature cardiovascular disease.  This patient does have a history of back pain and has had previous back surgery. He has a history of spinal stenosis.  I have reviewed the note from the referring office dated 08/06/2016. He was evaluated for lower extremity pain. He has a history of lumbar disc disease. In addition he has atrial fibrillation and some renal insufficiency. The patient was also noted to have bilateral lower extremity edema at that time.   Past Medical History:  Diagnosis Date  . Anemia   . Arrhythmia    PAROXYSMAL ATRIAL FIBRILLATION  . Arthritis    OSTEOARTHRITIS  . Cataracts, bilateral   . Chronic back pain   . Coronary artery disease 8/11   CABG...LM EMERGENT WITH IABP sees Dr. Legrand Como cooper  . DJD (degenerative joint disease)   . DVT (deep venous thrombosis) ([place])   . GERD (gastroesophageal reflux disease)   . History of BPH   . Hyperlipidemia   . Hypertension     . Hypothyroidism   . Myocardial infarction ([place])   . Pulmonary embolism ([place]) 2009   hx of  . RBBB (right bundle branch block)   . Renal artery stenosis ([place])   . Thrombocytopenia ([place])   . Thyroid disease    HYPOTHYROIDISM  . Unstable angina ([facility])    NONE IN LONG TIME    Family History  Problem Relation Age of Onset  . Lung cancer Father 30       deceased   . Cancer Father 89       LUNG  . Stroke Mother 9       DECEASED   . Hyperlipidemia Mother   . Heart attack Brother 25       deceased  . Diabetes Brother   . Liver cancer Sister 68       deceased   . Cancer Sister     SOCIAL HISTORY: Social History   Social History  . Marital status: Married    Spouse name: N/A  . Number of children: 2  . Years of education: N/A   Occupational History  . 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 Smoking status: Former Smoker    Quit date: 04/13/1974  . Smokeless tobacco: Former Systems developer    Quit date: 01/26/2002     Comment: He has smoked about 27-pack-year hx   .  Alcohol use 1.2 oz/week    2 Cans of beer per week     Comment: He used to drink more heavily , but rarely drinks at the current time  . Drug use: No  . Sexual activity: Not on file   Other Topics Concern  . Not on file   Social History Narrative   LIVES IN [place] WITH HIS WIFE.   HE HAS 2 GROWN CHILDREN   HE IS RETIRED PRISON FIRM SUPERINTENDENT.   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   HE GOES TO THE [employer] 3 X WEEKLY.    No Known Allergies  Current Outpatient Prescriptions  Medication Sig Dispense Refill  . acetaminophen (TYLENOL) 500 MG tablet Take 500-1,000 mg by mouth daily as needed for mild pain.    Marland Kitchen amLODipine (NORVASC) 5 MG tablet TAKE 1 TABLET BY MOUTH ONCE A DAY 30 tablet 5  . aspirin 81 MG tablet Take 81 mg by mouth daily.      . cetirizine (ZYRTEC) 10 MG tablet Take 10 mg by  mouth daily.    Mariane Baumgarten Calcium (STOOL SOFTENER PO) Take 2 tablets by mouth daily as needed.    . furosemide (LASIX) 40 MG tablet TAKE 1 TABLET BY MOUTH ONCE A DAY 90 tablet 2  . levothyroxine (SYNTHROID, LEVOTHROID) 125 MCG tablet TAKE 1 TABLET BY MOUTH ONCE DAILY 90 tablet 0  . metoprolol succinate (TOPROL-XL) 25 MG 24 hr tablet TAKE 1 TABLET BY MOUTH ONCE DAILY 90 tablet 1  . Multiple Vitamin (MULTIVITAMIN) tablet Take 1 tablet by mouth daily.    Marland Kitchen NASONEX 50 MCG/ACT nasal spray USE 2 SPRAYS IN EACH NOSTRIL ONCE A DAY 17 g 5  . oxyCODONE-acetaminophen (PERCOCET/ROXICET) 5-325 MG tablet Take 1 tablet by mouth every 8 (eight) hours as needed.   0  . ranitidine (ZANTAC) 150 MG tablet Take 150 mg by mouth 2 (two) times daily.    . rivaroxaban (XARELTO) 20 MG TABS tablet Take 20 mg by mouth daily with supper.    . rosuvastatin (CRESTOR) 10 MG tablet TAKE 1 TABLET BY MOUTH EVERY NIGHT AT BEDTIME 30 tablet 5  . tamsulosin (FLOMAX) 0.4 MG CAPS capsule TAKE 1 CAPSULE BY MOUTH ONCE DAILY 30 capsule 5  . XARELTO 20 MG TABS tablet TAKE 1 TABLET BY MOUTH ONCE A DAY 30 tablet 5   No current facility-administered medications for this visit.     REVIEW OF SYSTEMS:  [X]  denotes positive finding, [ ]  denotes negative finding Cardiac  Comments:  Chest pain or chest pressure:    Shortness of breath upon exertion:    Short of breath when lying flat:    Irregular heart rhythm:        Vascular    Pain in calf, thigh, or hip brought on by ambulation: X   Pain in feet at night that wakes you up from your sleep:     Blood clot in your veins: X   Leg swelling:         Pulmonary    Oxygen at home:    Productive cough:     Wheezing:         Neurologic    Sudden weakness in arms or legs:     Sudden numbness in arms or legs:     Sudden onset of difficulty speaking or slurred speech:    Temporary loss of vision in one eye:  Problems with dizziness:         Gastrointestinal    Blood in stool:       Vomited blood:         Genitourinary    Burning when urinating:     Blood in urine:        Psychiatric    Major depression:         Hematologic    Bleeding problems:    Problems with blood clotting too easily:        Skin    Rashes or ulcers:        Constitutional    Fever or chills:     PHYSICAL EXAM:   Vitals:   09/16/16 1251  BP: 119/67  Pulse: 75  Resp: 20  SpO2: 95%  Weight: 234 lb 8 oz (106.4 kg)  Height: 6\' 2"  (1.88 m)    GENERAL: The patient is a well-nourished male, in no acute distress. The vital signs are documented above. CARDIAC: There is a regular rate and rhythm.  VASCULAR: I do not detect carotid bruits. On the right side, he has a slightly diminished femoral pulse. I cannot palpate a popliteal or pedal pulses. I am unable to obtain a dorsalis pedis signal with the Doppler. He has a monophasic posterior tibial signal on the right with a Doppler. On the left side, he has a slightly diminished femoral pulse. He has a palpable popliteal pulse. He has a brisk dorsalis pedis and posterior tibial signal with the Doppler on the left. He has no significant lower extremity swelling. PULMONARY: There is good air exchange bilaterally without wheezing or rales. ABDOMEN: Soft and non-tender with normal pitched bowel sounds.  MUSCULOSKELETAL: There are no major deformities or cyanosis. NEUROLOGIC: No focal weakness or paresthesias are detected. SKIN: There are no ulcers or rashes noted. PSYCHIATRIC: The patient has a normal affect.  DATA:    The patient did not have any noninvasive studies today. He is due for a carotid duplex scan in July which is already scheduled.  MEDICAL ISSUES:   BILATERAL LOWER EXTREMITY PAIN: Based on his exam I think he does have evidence of possibly some aortoiliac occlusive disease and infrainguinal arterial occlusive disease. I think his leg symptoms could be attributed both to his peripheral vascular disease but also to his back  issues. I think it would be reasonable to pursue arteriography to look for iliac disease which might be amenable to angioplasty. This would also help [place] determine how much of his pain can be attributed to his peripheral vascular disease. However the patient feels that his symptoms are quite tolerable and therefore would like to hold off on arteriography at this time which I think is perfectly reasonable. I'll order a follow up visit in 6 months with ABIs at that time. He knows to call sooner if he has problems and we could schedule an arteriogram sooner. Fortunately, he is not a smoker. I have encouraged him to stay as active as possible. I'll see him back in 6 months unless he call sooner.  Deitra Mayo [facility] [phone]

## 2016-10-01 NOTE — Addendum Note (Signed)
Addended by: Lianne Cure A on: 10/01/2016 03:28 PM   Modules accepted: Orders

## 2016-10-02 ENCOUNTER — Other Ambulatory Visit: Payer: Self-pay | Admitting: Primary Care

## 2016-10-02 DIAGNOSIS — I10 Essential (primary) hypertension: Secondary | ICD-10-CM

## 2016-10-08 DIAGNOSIS — M5416 Radiculopathy, lumbar region: Secondary | ICD-10-CM | POA: Diagnosis not present

## 2016-10-08 DIAGNOSIS — M545 Low back pain: Secondary | ICD-10-CM | POA: Diagnosis not present

## 2016-10-08 DIAGNOSIS — Z6828 Body mass index (BMI) 28.0-28.9, adult: Secondary | ICD-10-CM | POA: Diagnosis not present

## 2016-10-08 DIAGNOSIS — I10 Essential (primary) hypertension: Secondary | ICD-10-CM | POA: Diagnosis not present

## 2016-10-30 ENCOUNTER — Encounter: Payer: Self-pay | Admitting: Family

## 2016-11-05 ENCOUNTER — Other Ambulatory Visit: Payer: Self-pay | Admitting: Primary Care

## 2016-11-05 DIAGNOSIS — I10 Essential (primary) hypertension: Secondary | ICD-10-CM | POA: Diagnosis not present

## 2016-11-05 DIAGNOSIS — Z6828 Body mass index (BMI) 28.0-28.9, adult: Secondary | ICD-10-CM | POA: Diagnosis not present

## 2016-11-05 DIAGNOSIS — M545 Low back pain: Secondary | ICD-10-CM | POA: Diagnosis not present

## 2016-11-05 DIAGNOSIS — M5416 Radiculopathy, lumbar region: Secondary | ICD-10-CM | POA: Diagnosis not present

## 2016-11-06 ENCOUNTER — Encounter: Payer: Self-pay | Admitting: Family

## 2016-11-06 ENCOUNTER — Ambulatory Visit (INDEPENDENT_AMBULATORY_CARE_PROVIDER_SITE_OTHER): Payer: Medicare Other | Admitting: Family

## 2016-11-06 ENCOUNTER — Ambulatory Visit (HOSPITAL_COMMUNITY)
Admission: RE | Admit: 2016-11-06 | Discharge: 2016-11-06 | Disposition: A | Discharge status: Home / Self Care | Payer: Medicare Other | Source: Ambulatory Visit | Attending: Vascular Surgery | Admitting: Vascular Surgery

## 2016-11-06 VITALS — BP 119/62 | HR 73 | Temp 98.3°F | Resp 20 | Ht 74.0 in | Wt 233.0 lb

## 2016-11-06 DIAGNOSIS — Z9889 Other specified postprocedural states: Secondary | ICD-10-CM | POA: Diagnosis not present

## 2016-11-06 DIAGNOSIS — I6523 Occlusion and stenosis of bilateral carotid arteries: Secondary | ICD-10-CM

## 2016-11-06 DIAGNOSIS — Z48812 Encounter for surgical aftercare following surgery on the circulatory system: Secondary | ICD-10-CM

## 2016-11-06 DIAGNOSIS — Z87891 Personal history of nicotine dependence: Secondary | ICD-10-CM

## 2016-11-06 LAB — VAS US CAROTID
LCCADDIAS: -11 cm/s
LCCADSYS: -57 cm/s
LCCAPSYS: 96 cm/s
LEFT ECA DIAS: 0 cm/s
Left CCA prox dias: 14 cm/s
Left ICA dist dias: -26 cm/s
Left ICA dist sys: -90 cm/s
Left ICA prox dias: -14 cm/s
Left ICA prox sys: -77 cm/s
RCCADSYS: -86 cm/s
RCCAPDIAS: 16 cm/s
RCCAPSYS: 138 cm/s
RIGHT CCA MID DIAS: -12 cm/s
RIGHT ECA DIAS: 0 cm/s
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
## 2016-11-06 NOTE — Patient Instructions (Signed)
Stroke Prevention Some medical conditions and behaviors are associated with an increased chance of having a stroke. You may prevent a stroke by making healthy choices and managing medical conditions. How can I reduce my risk of having a stroke?  Stay physically active. Get at least 30 minutes of activity on most or all days.  Do not smoke. It may also be helpful to avoid exposure to secondhand smoke.  Limit alcohol use. Moderate alcohol use is considered to be:  No more than 2 drinks per day for men.  No more than 1 drink per day for nonpregnant women.  Eat healthy foods. This involves:  Eating 5 or more servings of fruits and vegetables a day.  Making dietary changes that address high blood pressure (hypertension), high cholesterol, diabetes, or obesity.  Manage your cholesterol levels.  Making food choices that are high in fiber and low in saturated fat, trans fat, and cholesterol may control cholesterol levels.  Take any prescribed medicines to control cholesterol as directed by your health care provider.  Manage your diabetes.  Controlling your carbohydrate and sugar intake is recommended to manage diabetes.  Take any prescribed medicines to control diabetes as directed by your health care provider.  Control your hypertension.  Making food choices that are low in salt (sodium), saturated fat, trans fat, and cholesterol is recommended to manage hypertension.  Ask your health care provider if you need treatment to lower your blood pressure. Take any prescribed medicines to control hypertension as directed by your health care provider.  If you are 18-39 years of age, have your blood pressure checked every 3-5 years. If you are 40 years of age or older, have your blood pressure checked every year.  Maintain a healthy weight.  Reducing calorie intake and making food choices that are low in sodium, saturated fat, trans fat, and cholesterol are recommended to manage  weight.  Stop drug abuse.  Avoid taking birth control pills.  Talk to your health care provider about the risks of taking birth control pills if you are over 35 years old, smoke, get migraines, or have ever had a blood clot.  Get evaluated for sleep disorders (sleep apnea).  Talk to your health care provider about getting a sleep evaluation if you snore a lot or have excessive sleepiness.  Take medicines only as directed by your health care provider.  For some people, aspirin or blood thinners (anticoagulants) are helpful in reducing the risk of forming abnormal blood clots that can lead to stroke. If you have the irregular heart rhythm of atrial fibrillation, you should be on a blood thinner unless there is a good reason you cannot take them.  Understand all your medicine instructions.  Make sure that other conditions (such as anemia or atherosclerosis) are addressed. Get help right away if:  You have sudden weakness or numbness of the face, arm, or leg, especially on one side of the body.  Your face or eyelid droops to one side.  You have sudden confusion.  You have trouble speaking (aphasia) or understanding.  You have sudden trouble seeing in one or both eyes.  You have sudden trouble walking.  You have dizziness.  You have a loss of balance or coordination.  You have a sudden, severe headache with no known cause.  You have new chest pain or an irregular heartbeat. Any of these symptoms may represent a serious problem that is an emergency. Do not wait to see if the symptoms will go away.   Get medical help at once. Call your local emergency services (911 in [place] Do not drive yourself to the hospital. This information is not intended to replace advice given to you by your health care provider. Make sure you discuss any questions you have with your health care provider. Document Released: 05/07/2004 Document Revised: 09/05/2015 Document Reviewed: 09/30/2012 Elsevier  Interactive Patient Education  2017 Elsevier Inc.  

## 2016-12-03 DIAGNOSIS — M5416 Radiculopathy, lumbar region: Secondary | ICD-10-CM | POA: Diagnosis not present

## 2016-12-03 DIAGNOSIS — M545 Low back pain: Secondary | ICD-10-CM | POA: Diagnosis not present

## 2016-12-03 DIAGNOSIS — Z6828 Body mass index (BMI) 28.0-28.9, adult: Secondary | ICD-10-CM | POA: Diagnosis not present

## 2016-12-03 DIAGNOSIS — I10 Essential (primary) hypertension: Secondary | ICD-10-CM | POA: Diagnosis not present

## 2016-12-05 ENCOUNTER — Other Ambulatory Visit: Payer: Self-pay | Admitting: Primary Care

## 2016-12-05 ENCOUNTER — Other Ambulatory Visit: Payer: Self-pay | Admitting: Cardiovascular Disease

## 2017-01-06 DIAGNOSIS — I10 Essential (primary) hypertension: Secondary | ICD-10-CM | POA: Diagnosis not present

## 2017-01-06 DIAGNOSIS — M5416 Radiculopathy, lumbar region: Secondary | ICD-10-CM | POA: Diagnosis not present

## 2017-01-06 DIAGNOSIS — Z6828 Body mass index (BMI) 28.0-28.9, adult: Secondary | ICD-10-CM | POA: Diagnosis not present

## 2017-01-06 DIAGNOSIS — M545 Low back pain: Secondary | ICD-10-CM | POA: Diagnosis not present

## 2017-02-01 ENCOUNTER — Other Ambulatory Visit: Payer: Self-pay | Admitting: Primary Care

## 2017-02-01 DIAGNOSIS — E785 Hyperlipidemia, unspecified: Secondary | ICD-10-CM

## 2017-02-03 DIAGNOSIS — M4316 Spondylolisthesis, lumbar region: Secondary | ICD-10-CM | POA: Diagnosis not present

## 2017-02-03 DIAGNOSIS — M5416 Radiculopathy, lumbar region: Secondary | ICD-10-CM | POA: Diagnosis not present

## 2017-02-03 DIAGNOSIS — M5126 Other intervertebral disc displacement, lumbar region: Secondary | ICD-10-CM | POA: Diagnosis not present

## 2017-02-03 DIAGNOSIS — M545 Low back pain: Secondary | ICD-10-CM | POA: Diagnosis not present

## 2017-03-06 ENCOUNTER — Other Ambulatory Visit: Payer: Self-pay | Admitting: Primary Care

## 2017-03-06 DIAGNOSIS — E785 Hyperlipidemia, unspecified: Secondary | ICD-10-CM

## 2017-03-06 DIAGNOSIS — I10 Essential (primary) hypertension: Secondary | ICD-10-CM

## 2017-03-17 ENCOUNTER — Ambulatory Visit: Payer: Medicare Other | Admitting: Primary Care

## 2017-03-17 ENCOUNTER — Other Ambulatory Visit: Payer: Self-pay | Admitting: Primary Care

## 2017-03-17 VITALS — BP 124/74 | HR 82 | Temp 97.7°F | Ht 74.0 in | Wt 234.0 lb

## 2017-03-17 DIAGNOSIS — N400 Enlarged prostate without lower urinary tract symptoms: Secondary | ICD-10-CM | POA: Diagnosis not present

## 2017-03-17 DIAGNOSIS — E038 Other specified hypothyroidism: Secondary | ICD-10-CM

## 2017-03-17 DIAGNOSIS — E785 Hyperlipidemia, unspecified: Secondary | ICD-10-CM

## 2017-03-17 DIAGNOSIS — E782 Mixed hyperlipidemia: Secondary | ICD-10-CM

## 2017-03-17 DIAGNOSIS — I4891 Unspecified atrial fibrillation: Secondary | ICD-10-CM

## 2017-03-17 DIAGNOSIS — I10 Essential (primary) hypertension: Secondary | ICD-10-CM | POA: Diagnosis not present

## 2017-03-17 DIAGNOSIS — R7303 Prediabetes: Secondary | ICD-10-CM | POA: Diagnosis not present

## 2017-03-17 LAB — TSH: TSH: 1 u[IU]/mL (ref 0.35–4.50)

## 2017-03-17 LAB — COMPREHENSIVE METABOLIC PANEL
ALBUMIN: 4.2 g/dL (ref 3.5–5.2)
ALT: 13 U/L (ref 0–53)
AST: 20 U/L (ref 0–37)
Alkaline Phosphatase: 70 U/L (ref 39–117)
BILIRUBIN TOTAL: 0.7 mg/dL (ref 0.2–1.2)
BUN: 15 mg/dL (ref 6–23)
CALCIUM: 9.2 mg/dL (ref 8.4–10.5)
CO2: 29 mEq/L (ref 19–32)
CREATININE: 1.36 mg/dL (ref 0.40–1.50)
Chloride: 102 mEq/L (ref 96–112)
GFR: 52.94 mL/min — ABNORMAL LOW (ref >60.00)
Glucose, Bld: 95 mg/dL (ref 70–99)
Potassium: 3.9 mEq/L (ref 3.5–5.1)
SODIUM: 141 meq/L (ref 135–145)
Total Protein: 7.1 g/dL (ref 6.0–8.3)

## 2017-03-17 LAB — LIPID PANEL
CHOL/HDL RATIO: 4
CHOLESTEROL: 160 mg/dL (ref 0–200)
HDL: 42.4 mg/dL (ref >39.00)
LDL Cholesterol: 80 mg/dL (ref 0–99)
NonHDL: 117.61
TRIGLYCERIDES: 186 mg/dL — AB (ref 0.0–149.0)
VLDL: 37.2 mg/dL (ref 0.0–40.0)

## 2017-03-17 LAB — HEMOGLOBIN A1C: HEMOGLOBIN A1C: 6.3 % (ref 4.6–6.5)

## 2017-03-17 MED ORDER — LEVOTHYROXINE SODIUM 125 MCG PO TABS: ORAL_TABLET | ORAL | 3 refills | Status: DC

## 2017-03-17 MED ORDER — ROSUVASTATIN CALCIUM 10 MG PO TABS: ORAL_TABLET | ORAL | 3 refills | Status: DC

## 2017-03-17 NOTE — Assessment & Plan Note (Signed)
Repeat TSH pending.  Will refill levothyroxine once labs have been received.

## 2017-03-17 NOTE — Assessment & Plan Note (Signed)
Stable in the office today.  Continue amlodipine and Toprol.

## 2017-03-17 NOTE — Progress Notes (Signed)
Subjective:    Patient ID: Leonard Footman Berg., male    DOB: 09-27-1929, 83 y.o.   MRN: [id number]  HPI  Leonard Berg is an 83 year old male who presents today for follow up.  1) Essential Hypertension: Currently managed on amlodipine 5 mg, metoprolol succinate 25 mg, furesomide 40 mg.  He denies chest pain, dizziness, shortness of breath.  BP Readings from Last 3 Encounters:  03/17/17 124/74  11/06/16 119/62  09/16/16 119/67     2) Hypothyroidism: Currently managed on levothyroxine 125 mcg. Last TSH was in August 2017, unremarkable. Due for repeat TSH today.  3) Prediabetes: A1C of 6.3 one year ago. Due for repeat A1C today.  Diet currently consists of:  Breakfast: Cereal  Lunch: Sandiwich Dinner: Soup, steak, shrimp, chicken, potatoes, beans Snacks: Cheese crackers Desserts: 2-3 times weekly Beverages: Coke, some water, occasional alcohol  Exercise: He exercises three times weekly.    4) Hyperlipidemia: Currently managed on rosuvastatin 10 mg.  No recent lipid panel on file.  5) Paroxysmal Atrial Fibrillation, History of PE: Currently managed on Xarelto 20 mg and aspirin 81 mg. Currently following with Cardiology, next visit is scheduled for early 2019.  6) BPH: Currently managed on tamsulosin for which he takes daily.  He denies difficulty urinating, frequency, urgency.  7) Chronic Constipation: Currently managed on Amitiza 24 mcg which was initiated by his pain management doctor. He's taking this twice weekly, Senna once daily, and Miralax twice weekly.  With this regimen he will have 2 bowel movements weekly, without this regimen he will have one bowel movement within 7-10 days.  Review of Systems  Eyes: Negative for visual disturbance.  Respiratory: Negative for shortness of breath.   Gastrointestinal: Positive for constipation.  Genitourinary: Negative for difficulty urinating.  Neurological: Negative for dizziness and headaches.       Past Medical  History:  Diagnosis Date  . Anemia   . Arrhythmia    PAROXYSMAL ATRIAL FIBRILLATION  . Arthritis    OSTEOARTHRITIS  . Cataracts, bilateral   . Chronic back pain   . Coronary artery disease 8/11   CABG...LM EMERGENT WITH IABP sees Dr. Legrand Como cooper  . DJD (degenerative joint disease)   . DVT (deep venous thrombosis) ([facility])   . GERD (gastroesophageal reflux disease)   . History of BPH   . Hyperlipidemia   . Hypertension   . Hypothyroidism   . Myocardial infarction ([facility])   . Pulmonary embolism ([facility]) 2009   hx of  . RBBB (right bundle branch block)   . Renal artery stenosis ([facility])   . Thrombocytopenia ([place])   . Thyroid disease    HYPOTHYROIDISM  . Unstable angina ([facility])    NONE IN LONG TIME     Social History   Socioeconomic History  . Marital status: Married    Spouse name: Not on file  . Number of children: 2  . Years of education: Not on file  . Highest education level: Not on file  Social Needs  . Financial resource strain: Not on file  . Food insecurity - worry: Not on file  . Food insecurity - inability: Not on file  . Transportation needs - medical: Not on file  . Transportation needs - non-medical: Not on file  Occupational History  . 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 Smoking status: Former Smoker    Last attempt to  quit: 04/13/1974    Years since quitting: 42.9  . Smokeless tobacco: Former Systems developer    Quit date: 01/26/2002  . Tobacco comment: He has smoked about 27-pack-year hx   Substance and Sexual Activity  . Alcohol use: Yes    Alcohol/week: 1.2 oz    Types: 2 Cans of beer per week    Comment: He used to drink more heavily , but rarely drinks at the current time  . Drug use: No  . Sexual activity: Not on file  Other Topics Concern  . Not on file  Social History Narrative   LIVES IN [place] WITH HIS WIFE.   HE HAS 2 GROWN CHILDREN   HE IS RETIRED PRISON FIRM  SUPERINTENDENT.   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   HE GOES TO THE [employer] 3 X WEEKLY.    Past Surgical History:  Procedure Laterality Date  . Blood clot  Feb.20,  2016   Pulmonary Embolism  . CAROTID ENDARTERECTOMY Right Oct. 17, 2013   CE  . CAROTID ENDARTERECTOMY Left Dec. 3, 2016   CE  . CATARACT EXTRACTION  05/2014,06/2014  . CORONARY ARTERY BYPASS GRAFT  11-21-09   EMERGENT X 3 GRAFTING. ..[name], MD, cc: [name]  . ENDARTERECTOMY  01/28/2012   Procedure: ENDARTERECTOMY CAROTID;  Surgeon: [name], MD;  Location: [facility];  Service: Vascular;  Laterality: Right;  with Primary Closure of Artery  . ENDARTERECTOMY  03/15/2012   Procedure: ENDARTERECTOMY CAROTID;  Surgeon: [name], MD;  Location: [facility];  Service: Vascular;  Laterality: Left;  . FOOT SURGERY    . MANDIBLE SURGERY    . MAXIMUM ACCESS (MAS)POSTERIOR LUMBAR INTERBODY FUSION (PLIF) 2 LEVEL N/A 08/08/2015   Procedure: Lumbar Four-Five, Lumbar Five-Sacral One Maximum access posterior lumbar interbody fusion;  Surgeon: [name], MD;  Location: [facility];  Service: Neurosurgery;  Laterality: N/A;  LUMBAR FOUR-FIVE ,LUMBAR FIVE -SACRAL Maximum access posterior lumbar interbody fusion  . RENAL ARTERY STENT  2010  . REPLACEMENT TOTAL KNEE  2006   RIGHT  . TOTAL HIP ARTHROPLASTY     right    Family History  Problem Relation Age of Onset  . Lung cancer Father 67       deceased   . Cancer Father 13       LUNG  . Stroke Mother 92       DECEASED   . Hyperlipidemia Mother   . Heart attack Brother 30       deceased  . Diabetes Brother   . Liver cancer Sister 60       deceased   . Cancer Sister     No Known Allergies  Current Outpatient Medications on File Prior to Visit  Medication Sig Dispense Refill  . acetaminophen (TYLENOL) 500 MG tablet Take 500-1,000 mg by mouth daily as needed for mild pain.    Marland Kitchen amLODipine (NORVASC) 5  MG tablet Take 1 tablet (5 mg total) by mouth daily. NEED OFFICE APPT FOR ANY MORE REFILLS 30 tablet 0  . aspirin 81 MG tablet Take 81 mg by mouth daily.      . cetirizine (ZYRTEC) 10 MG tablet Take 10 mg by mouth daily.    . furosemide (LASIX) 40 MG tablet TAKE 1 TABLET BY MOUTH ONCE A DAY 90 tablet 2  . levothyroxine (SYNTHROID, LEVOTHROID) 125 MCG tablet TAKE 1 TABLET BY MOUTH ONCE DAILY *NEED  OFFICE VISIT FOR MORE REFILLS* 30 tablet 0  . lubiprostone (AMITIZA) 24 MCG capsule Take 24 mcg by mouth 2 (two) times daily with a meal.    . metoprolol succinate (TOPROL-XL) 25 MG 24 hr tablet TAKE 1 TABLET BY MOUTH ONCE A DAY 90 tablet 1  . rosuvastatin (CRESTOR) 10 MG tablet TAKE 1 TABLET BY MOUTH EVERY NIGHT AT. BEDTIME. WILL NEED OFFICE VISIT FOR ANY MORE REFILLS. 30 tablet 0  . tamsulosin (FLOMAX) 0.4 MG CAPS capsule Take 1 capsule (0.4 mg total) by mouth daily. NEED OFFICE APPT FOR ANY MORE REFILLS 30 capsule 5  . XARELTO 20 MG TABS tablet TAKE 1 TABLET BY MOUTH ONCE A DAY 30 tablet 5  . Docusate Calcium (STOOL SOFTENER PO) Take 2 tablets by mouth daily as needed.    . ranitidine (ZANTAC) 150 MG tablet Take 150 mg by mouth 2 (two) times daily.     No current facility-administered medications on file prior to visit.     BP 124/74   Pulse 82   Temp 97.7 F (36.5 C) (Oral)   Ht 6\' 2"  (1.88 m)   Wt 234 lb (106.1 kg)   SpO2 97%   BMI 30.04 kg/m    Objective:   Physical Exam  Constitutional: He is oriented to person, place, and time. He appears well-nourished.  Neck: Neck supple.  Cardiovascular: Normal rate and regular rhythm.  Pulmonary/Chest: Effort normal and breath sounds normal.  Abdominal: Soft. There is no tenderness.  Neurological: He is alert and oriented to person, place, and time.  Skin: Skin is warm and dry.          Assessment & Plan:

## 2017-03-17 NOTE — Patient Instructions (Signed)
Complete lab work prior to leaving today. I will notify you of your results once received.   Increase consumption of vegetables, fruit, whole grains.  Ensure you are consuming 64 ounces of water daily.  Follow up with the heart doctor as scheduled.  Try taking the Amitiza once daily for now. Decrease to three times weekly if you experience diarrhea.   Hold Miralax and Senna for now. Call or message me if you have any problems with taking Amitiza daily.   It was a pleasure to see you today!

## 2017-03-17 NOTE — Assessment & Plan Note (Signed)
Repeat lipid panel pending today. We will send refills of Crestor once labs received.

## 2017-03-17 NOTE — Assessment & Plan Note (Signed)
Asymptomatic.  Continue daily tamsulosin.

## 2017-03-17 NOTE — Assessment & Plan Note (Signed)
Rate and rhythm regular today.  Continue Xarelto and Toprol.

## 2017-03-24 ENCOUNTER — Ambulatory Visit: Payer: Medicare Other | Admitting: Family

## 2017-03-24 ENCOUNTER — Ambulatory Visit (HOSPITAL_COMMUNITY)
Admission: RE | Admit: 2017-03-24 | Discharge: 2017-03-24 | Disposition: A | Discharge status: Home / Self Care | Payer: Medicare Other | Source: Ambulatory Visit | Attending: Family | Admitting: Family

## 2017-03-24 ENCOUNTER — Ambulatory Visit: Payer: Medicare Other | Admitting: Vascular Surgery

## 2017-03-24 ENCOUNTER — Encounter: Payer: Self-pay | Admitting: Family

## 2017-03-24 VITALS — BP 125/60 | HR 58 | Temp 97.6°F | Resp 19 | Wt 234.7 lb

## 2017-03-24 DIAGNOSIS — Z9889 Other specified postprocedural states: Secondary | ICD-10-CM | POA: Diagnosis not present

## 2017-03-24 DIAGNOSIS — K1321 Leukoplakia of oral mucosa, including tongue: Secondary | ICD-10-CM

## 2017-03-24 DIAGNOSIS — I779 Disorder of arteries and arterioles, unspecified: Secondary | ICD-10-CM | POA: Diagnosis not present

## 2017-03-24 DIAGNOSIS — Z87891 Personal history of nicotine dependence: Secondary | ICD-10-CM

## 2017-03-24 DIAGNOSIS — I739 Peripheral vascular disease, unspecified: Secondary | ICD-10-CM

## 2017-03-24 DIAGNOSIS — I6523 Occlusion and stenosis of bilateral carotid arteries: Secondary | ICD-10-CM | POA: Diagnosis not present

## 2017-03-24 NOTE — Patient Instructions (Signed)
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
## 2017-04-08 ENCOUNTER — Other Ambulatory Visit: Payer: Self-pay | Admitting: Primary Care

## 2017-04-08 DIAGNOSIS — I10 Essential (primary) hypertension: Secondary | ICD-10-CM

## 2017-04-16 DIAGNOSIS — M961 Postlaminectomy syndrome, not elsewhere classified: Secondary | ICD-10-CM | POA: Diagnosis not present

## 2017-04-17 ENCOUNTER — Other Ambulatory Visit: Payer: Self-pay | Admitting: Primary Care

## 2017-04-17 DIAGNOSIS — I10 Essential (primary) hypertension: Secondary | ICD-10-CM

## 2017-04-22 DIAGNOSIS — M5126 Other intervertebral disc displacement, lumbar region: Secondary | ICD-10-CM | POA: Diagnosis not present

## 2017-04-22 DIAGNOSIS — I10 Essential (primary) hypertension: Secondary | ICD-10-CM | POA: Diagnosis not present

## 2017-04-22 DIAGNOSIS — M4316 Spondylolisthesis, lumbar region: Secondary | ICD-10-CM | POA: Diagnosis not present

## 2017-04-22 DIAGNOSIS — M961 Postlaminectomy syndrome, not elsewhere classified: Secondary | ICD-10-CM | POA: Diagnosis not present

## 2017-04-28 ENCOUNTER — Telehealth: Payer: Self-pay | Admitting: *Deleted

## 2017-04-28 DIAGNOSIS — D485 Neoplasm of uncertain behavior of skin: Secondary | ICD-10-CM | POA: Diagnosis not present

## 2017-04-28 DIAGNOSIS — I129 Hypertensive chronic kidney disease with stage 1 through stage 4 chronic kidney disease, or unspecified chronic kidney disease: Secondary | ICD-10-CM | POA: Diagnosis not present

## 2017-04-28 DIAGNOSIS — N2581 Secondary hyperparathyroidism of renal origin: Secondary | ICD-10-CM | POA: Diagnosis not present

## 2017-04-28 DIAGNOSIS — D631 Anemia in chronic kidney disease: Secondary | ICD-10-CM | POA: Diagnosis not present

## 2017-04-28 DIAGNOSIS — N183 Chronic kidney disease, stage 3 (moderate): Secondary | ICD-10-CM | POA: Diagnosis not present

## 2017-04-28 DIAGNOSIS — Z85828 Personal history of other malignant neoplasm of skin: Secondary | ICD-10-CM | POA: Diagnosis not present

## 2017-04-28 DIAGNOSIS — L821 Other seborrheic keratosis: Secondary | ICD-10-CM | POA: Diagnosis not present

## 2017-04-28 DIAGNOSIS — N189 Chronic kidney disease, unspecified: Secondary | ICD-10-CM | POA: Diagnosis not present

## 2017-04-28 DIAGNOSIS — Z08 Encounter for follow-up examination after completed treatment for malignant neoplasm: Secondary | ICD-10-CM | POA: Diagnosis not present

## 2017-04-28 NOTE — Telephone Encounter (Addendum)
   [facility] Pre-operative Risk Assessment    Request for surgical clearance:  1. What type of surgery is being performed? LUMBAR SPINAL CORD AND STIMULATOR PLACEMENT    2. When is this surgery scheduled? TBD   3. Are there any medications that need to be held prior to surgery and how long? ON XARELTO AND ASA   4. Practice name and name of physician performing surgery? [facility]; DR. PAUL HARKINS   5. What is your office phone and fax number? PH# [phone], FAX# [phone]   5. Anesthesia type (None, local, MAC, general) Mac anesthesia.    Leonard Berg 04/28/2017, 1:33 PM  _________________________________________________________________   (provider comments below)

## 2017-04-28 NOTE — Telephone Encounter (Signed)
Follow up   Patient is having Mac anesthesia

## 2017-04-29 NOTE — Telephone Encounter (Signed)
   Primary Cardiologist:Dr Burt Knack  Chart reviewed as part of pre-operative protocol coverage. Because of Leonard POSER Sr.'s past medical history and time since last visit, he/she will require a follow-up visit in order to better assess preoperative cardiovascular risk.  Pre-op covering staff: - Please schedule appointment and call patient to inform them. - Please contact requesting surgeon's office via preferred method (i.e, phone, fax) to inform them of need for appointment prior to surgery.  Kerin Ransom, PA-C  04/29/2017, 3:21 PM

## 2017-04-29 NOTE — Telephone Encounter (Signed)
Lmtcb to schedule appt for surgery clearance per Kerin Ransom, PA.

## 2017-04-30 ENCOUNTER — Encounter: Payer: Self-pay | Admitting: *Deleted

## 2017-04-30 NOTE — Telephone Encounter (Signed)
I tried to reach pt again today to schedule an appt per Kerin Ransom for surgery clearance . I will send a message through [place] for the pt to call the office for an appt.

## 2017-05-03 NOTE — Telephone Encounter (Signed)
    Spoke with patient's wife, the ER aware of the appointment on 1/23 with Richardson Dopp PA-C and will keep it.  Rosaria Ferries, PA-C 05/03/2017 3:00 PM Beeper [phone]

## 2017-05-05 ENCOUNTER — Ambulatory Visit (INDEPENDENT_AMBULATORY_CARE_PROVIDER_SITE_OTHER): Payer: Medicare Other | Admitting: Physician Assistant

## 2017-05-05 ENCOUNTER — Encounter: Payer: Self-pay | Admitting: Physician Assistant

## 2017-05-05 VITALS — BP 120/58 | HR 84 | Ht 74.0 in | Wt 236.6 lb

## 2017-05-05 DIAGNOSIS — I2510 Atherosclerotic heart disease of native coronary artery without angina pectoris: Secondary | ICD-10-CM

## 2017-05-05 DIAGNOSIS — E782 Mixed hyperlipidemia: Secondary | ICD-10-CM

## 2017-05-05 DIAGNOSIS — Z01810 Encounter for preprocedural cardiovascular examination: Secondary | ICD-10-CM

## 2017-05-05 DIAGNOSIS — I10 Essential (primary) hypertension: Secondary | ICD-10-CM | POA: Diagnosis not present

## 2017-05-05 DIAGNOSIS — I6523 Occlusion and stenosis of bilateral carotid arteries: Secondary | ICD-10-CM

## 2017-05-05 DIAGNOSIS — Z86711 Personal history of pulmonary embolism: Secondary | ICD-10-CM | POA: Diagnosis not present

## 2017-05-05 NOTE — Patient Instructions (Signed)
Medication Instructions:  No changes. 1. It is ok to hold the Xarelto for 72 hours prior to your back procedure.  The surgeon will resume this after your surgery as soon as it is safe. 2. It is ok to hold the Aspirin for 7 days prior to your procedure and resume after once the surgeon says it is safe.   Labwork: None.   Testing/Procedures: None   Follow-Up: Dr. [name] in 6 months.   Any Other Special Instructions Will Be Listed Below (If Applicable).  If you need a refill on your cardiac medications before your next appointment, please call your pharmacy.

## 2017-05-05 NOTE — Progress Notes (Signed)
Cardiology Office Note:    Date:  05/05/2017   ID:  Leonard [name], DOB Apr 11, 1930, MRN [id number]  PCP:  [name], NP  Cardiologist:  [name], MD  Vascular Surgeon:  Dr. Scot Dock  Referring MD: [name], NP   Chief Complaint  Patient presents with  . Surgical Clearance    History of Present Illness:    Leonard FURCHES Sr. is a 84 y.o. male with a hx of coronary artery disease status post bypass surgery in 2011, peripheral arterial disease status post renal artery stenting in 2012, carotid artery disease status post bilateral carotid endarterectomies in 2013, recurrent pulmonary embolism maintained on long-term anticoagulation.  He was last seen by Dr. Burt Knack in January 2018.  Mr. Blucher returns for surgical clearance.  He needs a spinal cord stimulator with Dr. Maryjean Ka at [facility].  He is here alone.  He is quite limited by back pain but remains active. He goes to water aerobics several times a week without any limitations.  He rides a Regulatory affairs officer all [place].  He denies chest pain, shortness of breath, syncope, paroxysmal nocturnal dyspnea, edema.    Prior CV studies:   The following studies were reviewed today:  Carotid US 7/18 Patent bilateral carotid endarterectomies  Echocardiogram 08/28/16 Mild concentric LVH, EF 55-60, normal wall motion, grade 2 diastolic dysfunction   Past Medical History:  Diagnosis Date  . Anemia   . Arrhythmia    PAROXYSMAL ATRIAL FIBRILLATION  . Arthritis    OSTEOARTHRITIS  . Cataracts, bilateral   . Chronic back pain   . Coronary artery disease 8/11   CABG...LM EMERGENT WITH IABP sees Dr. Legrand Como cooper  . DJD (degenerative joint disease)   . DVT (deep venous thrombosis) ([place])   . GERD (gastroesophageal reflux disease)   . History of BPH   . Hyperlipidemia   . Hypertension   . Hypothyroidism   . Myocardial infarction ([place])   . Pulmonary embolism ([place]) 2009   hx of  . RBBB (right bundle branch block)   . Renal artery stenosis ([place])   . Thrombocytopenia ([place])   . Thyroid disease    HYPOTHYROIDISM  . Unstable angina ([place])    NONE IN LONG TIME    Past Surgical History:  Procedure Laterality Date  . Blood clot  Feb.20,  2016   Pulmonary Embolism  . CAROTID ENDARTERECTOMY Right Oct. 17, 2013   CE  . CAROTID ENDARTERECTOMY Left Dec. 3, 2016   CE  . CATARACT EXTRACTION  05/2014,06/2014  . CORONARY ARTERY BYPASS GRAFT  11-21-09   EMERGENT X 3 GRAFTING. ..[name], MD, cc: [name]  . ENDARTERECTOMY  01/28/2012   Procedure: ENDARTERECTOMY CAROTID;  Surgeon: [name], MD;  Location: [place];  Service: Vascular;  Laterality: Right;  with Primary Closure of Artery  . ENDARTERECTOMY  03/15/2012   Procedure: ENDARTERECTOMY CAROTID;  Surgeon: [name], MD;  Location: [place];  Service: Vascular;  Laterality: Left;  . FOOT SURGERY    . MANDIBLE SURGERY    . MAXIMUM ACCESS (MAS)POSTERIOR LUMBAR INTERBODY FUSION (PLIF) 2 LEVEL N/A 08/08/2015   Procedure: Lumbar Four-Five, Lumbar Five-Sacral One Maximum access posterior lumbar interbody fusion;  Surgeon: [name], MD;  Location: [facility] [facility] [facility];  Service: Neurosurgery;  Laterality: N/A;  LUMBAR FOUR-FIVE ,LUMBAR FIVE -SACRAL Maximum access posterior lumbar interbody fusion  . RENAL ARTERY STENT  2010  . REPLACEMENT TOTAL KNEE  2006  RIGHT  . TOTAL HIP ARTHROPLASTY     right    Current Medications: Current Meds  Medication Sig  . acetaminophen (TYLENOL) 500 MG tablet Take 500-1,000 mg by mouth daily as needed for mild pain.  Marland Kitchen amLODipine (NORVASC) 5 MG tablet TAKE 1 TABLET BY MOUTH ONCE A DAY  . aspirin 81 MG tablet Take 81 mg by mouth daily.    . cetirizine (ZYRTEC) 10 MG tablet Take 10 mg by mouth daily.  Mariane Baumgarten Calcium (STOOL SOFTENER PO) Take 2 tablets by mouth daily as needed.  . furosemide (LASIX) 40 MG tablet TAKE 1 TABLET BY MOUTH ONCE A DAY  .  levothyroxine (SYNTHROID, LEVOTHROID) 125 MCG tablet Take 1 tablet by mouth every morning on empty stomach with a full glass of water.  . lubiprostone (AMITIZA) 24 MCG capsule Take 24 mcg by mouth 2 (two) times daily with a meal.  . metoprolol succinate (TOPROL-XL) 25 MG 24 hr tablet TAKE 1 TABLET BY MOUTH ONCE A DAY  . ranitidine (ZANTAC) 150 MG tablet Take 150 mg by mouth 2 (two) times daily.  . rosuvastatin (CRESTOR) 10 MG tablet TAKE 1 TABLET BY MOUTH EVERY NIGHT AT. BEDTIME.  . tamsulosin (FLOMAX) 0.4 MG CAPS capsule Take 1 capsule (0.4 mg total) by mouth daily. NEED OFFICE APPT FOR ANY MORE REFILLS  . XARELTO 20 MG TABS tablet TAKE 1 TABLET BY MOUTH ONCE A DAY     Allergies:   Patient has no known allergies.   Social History   Tobacco Use  . Smoking status: Former Smoker    Last attempt to quit: 04/13/1974    Years since quitting: 43.0  . Smokeless tobacco: Former Systems developer    Quit date: 01/26/2002  . Tobacco comment: He has smoked about 27-pack-year hx   Substance Use Topics  . Alcohol use: Yes    Alcohol/week: 1.2 oz    Types: 2 Cans of beer per week    Comment: He used to drink more heavily , but rarely drinks at the current time  . Drug use: No     Family Hx: The patient's family history includes Cancer in his sister; Cancer (age of onset: 34) in his father; Diabetes in his brother; Heart attack (age of onset: 38) in his brother; Hyperlipidemia in his mother; Liver cancer (age of onset: 71) in his sister; Lung cancer (age of onset: 54) in his father; Stroke (age of onset: 32) in his mother.  ROS:   Please see the history of present illness.    Review of Systems  Musculoskeletal: Positive for back pain and myalgias.  Gastrointestinal: Positive for constipation.  Neurological: Positive for loss of balance.   All other systems reviewed and are negative.   EKGs/Labs/Other Test Reviewed:    EKG:  EKG is  ordered today.  The ekg ordered today demonstrates normal sinus rhythm,  heart rate 77, normal axis, right bundle branch block, PAC, QTC 477, no change from prior tracing  Recent Labs: 03/17/2017: ALT 13; BUN 15; Creatinine, Ser 1.36; Potassium 3.9; Sodium 141; TSH 1.00   Recent Lipid Panel Lab Results  Component Value Date/Time   CHOL 160 03/17/2017 12:01 PM   TRIG 186.0 (H) 03/17/2017 12:01 PM   HDL 42.40 03/17/2017 12:01 PM   CHOLHDL 4 03/17/2017 12:01 PM   LDLCALC 80 03/17/2017 12:01 PM   LDLDIRECT 100.8 08/12/2010 03:23 PM    Physical Exam:    VS:  BP (!) 120/58   Pulse 84  Ht 6\' 2"  (1.88 m)   Wt 236 lb 9.6 oz (107.3 kg)   SpO2 94%   BMI 30.38 kg/m     Wt Readings from Last 3 Encounters:  05/05/17 236 lb 9.6 oz (107.3 kg)  03/24/17 234 lb 11.2 oz (106.5 kg)  03/17/17 234 lb (106.1 kg)     Physical Exam  Constitutional: He appears well-developed and well-nourished. No distress.  HENT:  Head: Normocephalic and atraumatic.  Eyes: No scleral icterus.  Neck: No JVD present.  Cardiovascular: Normal rate and regular rhythm.  No murmur heard. Pulmonary/Chest: Effort normal. He has no rales.  Abdominal: Soft.  Musculoskeletal: He exhibits no edema.  Neurological: He is alert.  Skin: Skin is warm and dry.    ASSESSMENT:    1. Preoperative cardiovascular examination   2. Coronary artery disease involving native coronary artery of native heart without angina pectoris   3. History of pulmonary embolism   4. Bilateral carotid artery stenosis   5. Essential hypertension   6. Mixed hyperlipidemia    PLAN:    In order of problems listed above:  1. Preoperative cardiovascular examination The Revised Cardiac Risk Index indicates that his Perioperative Risk of Major Cardiac Event is (%): 6.6.  Therefore, he is at high risk for perioperative complications.  However, His Functional Capacity in METs is: 4.37 (which is good) as indicated by the Duke Activity Status Index (DASI).  Therefore, According to ACC/AHA guidelines, no further  cardiovascular testing needed.  The patient may proceed to surgery at acceptable risk.     He can hold Xarelto for 72 hours prior to his procedure and resume as soon as it is safe.  He can hold Aspirin (if necessary) for 7 days and resume after as soon as it is safe.    2. Coronary artery disease involving native coronary artery of native heart without angina pectoris Hx of CABG in 2011.  He denies angina.  His functional status is > 4 METs. Continue ASA, statin.  3. History of pulmonary embolism Continue Xarelto.  Creatinine clearance is 60.  Continue Xarelto 20 mg QD.  4. Bilateral carotid artery stenosis Follow up with Dr. Scot Dock as planned.  5. Essential hypertension The patient's blood pressure is controlled on his current regimen.  Continue current therapy.    6. Mixed hyperlipidemia LDL optimal on most recent lab work.  Continue current Rx.     Dispo:  Return in about 6 months (around 11/02/2017) for Routine Follow Up, w/ Dr. Burt Knack.   Medication Adjustments/Labs and Tests Ordered: Current medicines are reviewed at length with the patient today.  Concerns regarding medicines are outlined above.  Tests Ordered: Orders Placed This Encounter  Procedures  . EKG 12-Lead   Medication Changes: No orders of the defined types were placed in this encounter.   Signed, Richardson Dopp, PA-C  05/05/2017 5:13 PM    [facility] [address], [place], [place]  [address] Phone: [phone]; Fax: [phone]

## 2017-05-10 ENCOUNTER — Other Ambulatory Visit: Payer: Self-pay | Admitting: Anesthesiology

## 2017-05-19 ENCOUNTER — Encounter (HOSPITAL_COMMUNITY): Payer: Self-pay | Admitting: Emergency Medicine

## 2017-05-19 ENCOUNTER — Encounter (HOSPITAL_COMMUNITY)
Admission: RE | Admit: 2017-05-19 | Discharge: 2017-05-19 | Disposition: A | Discharge status: Home / Self Care | Payer: Medicare Other | Source: Ambulatory Visit | Attending: Anesthesiology | Admitting: Anesthesiology

## 2017-05-19 ENCOUNTER — Encounter (HOSPITAL_COMMUNITY): Payer: Self-pay

## 2017-05-19 DIAGNOSIS — Z87891 Personal history of nicotine dependence: Secondary | ICD-10-CM | POA: Insufficient documentation

## 2017-05-19 DIAGNOSIS — Z79899 Other long term (current) drug therapy: Secondary | ICD-10-CM | POA: Diagnosis not present

## 2017-05-19 DIAGNOSIS — I2510 Atherosclerotic heart disease of native coronary artery without angina pectoris: Secondary | ICD-10-CM | POA: Diagnosis not present

## 2017-05-19 DIAGNOSIS — I482 Chronic atrial fibrillation: Secondary | ICD-10-CM | POA: Insufficient documentation

## 2017-05-19 DIAGNOSIS — E785 Hyperlipidemia, unspecified: Secondary | ICD-10-CM | POA: Diagnosis not present

## 2017-05-19 DIAGNOSIS — Z7901 Long term (current) use of anticoagulants: Secondary | ICD-10-CM | POA: Insufficient documentation

## 2017-05-19 DIAGNOSIS — I4510 Unspecified right bundle-branch block: Secondary | ICD-10-CM | POA: Diagnosis not present

## 2017-05-19 DIAGNOSIS — K219 Gastro-esophageal reflux disease without esophagitis: Secondary | ICD-10-CM | POA: Insufficient documentation

## 2017-05-19 DIAGNOSIS — Z7982 Long term (current) use of aspirin: Secondary | ICD-10-CM | POA: Diagnosis not present

## 2017-05-19 DIAGNOSIS — I10 Essential (primary) hypertension: Secondary | ICD-10-CM | POA: Diagnosis not present

## 2017-05-19 DIAGNOSIS — M961 Postlaminectomy syndrome, not elsewhere classified: Secondary | ICD-10-CM | POA: Insufficient documentation

## 2017-05-19 DIAGNOSIS — Z01812 Encounter for preprocedural laboratory examination: Secondary | ICD-10-CM | POA: Diagnosis present

## 2017-05-19 DIAGNOSIS — D649 Anemia, unspecified: Secondary | ICD-10-CM | POA: Diagnosis not present

## 2017-05-19 HISTORY — DX: Dyspnea, unspecified: R06.00

## 2017-05-19 HISTORY — DX: Anxiety disorder, unspecified: F41.9

## 2017-05-19 LAB — PROTIME-INR
INR: 2.06
Prothrombin Time: 23.1 seconds — ABNORMAL HIGH (ref 11.4–15.2)

## 2017-05-19 LAB — BASIC METABOLIC PANEL
Anion gap: 12 (ref 5–15)
BUN: 13 mg/dL (ref 6–20)
CHLORIDE: 106 mmol/L (ref 101–111)
CO2: 21 mmol/L — AB (ref 22–32)
CREATININE: 1.32 mg/dL — AB (ref 0.61–1.24)
Calcium: 8.6 mg/dL — ABNORMAL LOW (ref 8.9–10.3)
GFR calc non Af Amer: 47 mL/min — ABNORMAL LOW (ref >60)
GFR, EST AFRICAN AMERICAN: 55 mL/min — AB (ref >60)
GLUCOSE: 138 mg/dL — AB (ref 65–99)
Potassium: 3.8 mmol/L (ref 3.5–5.1)
Sodium: 139 mmol/L (ref 135–145)

## 2017-05-19 LAB — APTT: aPTT: 41 seconds — ABNORMAL HIGH (ref 24–36)

## 2017-05-19 LAB — CBC
HCT: 43.8 % (ref 39.0–52.0)
Hemoglobin: 14.6 g/dL (ref 13.0–17.0)
MCH: 32.2 pg (ref 26.0–34.0)
MCHC: 33.3 g/dL (ref 30.0–36.0)
MCV: 96.5 fL (ref 78.0–100.0)
PLATELETS: 146 10*3/uL — AB (ref 150–400)
RBC: 4.54 MIL/uL (ref 4.22–5.81)
RDW: 12.8 % (ref 11.5–15.5)
WBC: 5.7 10*3/uL (ref 4.0–10.5)

## 2017-05-19 LAB — SURGICAL PCR SCREEN
MRSA, PCR: NEGATIVE
Staphylococcus aureus: POSITIVE — AB

## 2017-05-19 NOTE — Pre-Procedure Instructions (Signed)
    [place]  05/19/2017      [place], [place] [address] Phone: [phone] Fax: [phone]    Your procedure is scheduled on 05/21/17.  Report to [facility] Admitting at 1045 A.M.  Call this number if you have problems the morning of surgery:  [phone]   Remember:  Do not eat food or drink liquids after midnight.  Take these medicines the morning of surgery with A SIP OF WATER --norvasc,synthroid,metoprolol,flomax,zantac   Do not wear jewelry, make-up or nail polish.  Do not wear lotions, powders, or perfumes, or deodorant.  Do not shave 48 hours prior to surgery.  Men may shave face and neck.  Do not bring valuables to the hospital.  [facility] is not responsible for any belongings or valuables.  Contacts, dentures or bridgework may not be worn into surgery.  Leave your suitcase in the car.  After surgery it may be brought to your room.  For patients admitted to the hospital, discharge time will be determined by your treatment team.  Patients discharged the day of surgery will not be allowed to drive home.   Name and phone number of your driver:   Special instructions:  Do not take any aspirin,anti-inflammatories,vitamins,or herbal supplements 5-7 days prior to surgery.  Please read over the following fact sheets that you were given. MRSA Information

## 2017-05-19 NOTE — Progress Notes (Signed)
Anesthesia Chart Review:  Pt is an 84 year old male scheduled for lumbar spinal cord stimulator insertion on 05/21/2017 with [name], MD  - PCP is Alma Friendly, NP - Cardiologist is [name], MD.  Cleared for surgery at last office visit 05/05/17 with Richardson Dopp, [place] - Vascular surgeon is Deitra Mayo, MD. Last office visit 03/24/17.   PMH includes:  CAD (s/p CABG 2011), PAF, RBBB, HTN, hyperlipidemia, anemia, DVT, PE, hypothyroidism, renal artery stenosis (s/p stent 2010), thrombocytopenia, GERD.  Former smoker.  BMI 30.5.  S/p PLIF 08/08/15. S/p L CEA 03/15/12.  S/p R CEA 01/28/12.   Medications include: Amlodipine, ASA 81 mg, Lasix, levothyroxine, metoprolol, Zantac, rosuvastatin, xarelto. Pt to hold xarelto 72 hours, and ASA 7 days before surgery  BP (!) 148/57   Pulse 86   Temp 36.4 C   Resp 20   Ht 6\' 2"  (1.88 m)   Wt 237 lb (107.5 kg)   SpO2 95%   BMI 30.43 kg/m   Preoperative labs reviewed.   - PT 23.1, PTT 41.  Will repeat day of surgery.   EKG 05/05/17: Sinus rhythm, PAC.  RBBB.   Carotid duplex 11/06/16:  - Patent bilateral carotid endarterectomy sites with no evidence of restenosis  Echo 08/28/16:  - Left ventricle: The cavity size was mildly dilated. There was mild concentric hypertrophy. Systolic function was normal. The estimated ejection fraction was in the range of 55% to 60%. Wall motion was normal; there were no regional wall motion abnormalities. Features are consistent with a pseudonormal left ventricular filling pattern, with concomitant abnormal relaxation and increased filling pressure (grade 2 diastolic dysfunction). - Aortic valve: Trileaflet; mildly thickened, mildly calcified leaflets. - Mitral valve: Minimal focal calcification of the anterior leaflet (medial segment(s)). - Tricuspid valve: There was trivial regurgitation.  If labs acceptable day of surgery, I anticipate pt can proceed with surgery as scheduled.   Willeen Cass,  FNP-BC [facility]/Anesthesiology Phone: [phone] 05/19/2017 3:33 PM

## 2017-05-20 NOTE — H&P (Signed)
Leonard R Lembo Sr. is an 84 y.o. male.   Chief Complaint:  Back pain with radiation into the lower extremities HPI:  Patient with a fairly complex past medical history that includes coronary artery disease peripheral vascular disease dyslipidemia, hypertension, but also history of lumbar degenerative disc disease for which she has undergone lumbar surgery with Dr. Vertell Limber.  He still continues to have low back pain, with radiation to lower extremities.  He has had limited benefit from intervention, and medication management.  He still is quite active, or wants to be, and so we considered S CS therapy.  Patient underwent a trial where in he reported better than 70% improvement in his pain control.  He was interval increase his activities.  He was very pleased with the outcome of his trial, and has requested permanent implantation.  Past Medical History:  Diagnosis Date  . Anemia   . Anxiety   . Arrhythmia    PAROXYSMAL ATRIAL FIBRILLATION  . Arthritis    OSTEOARTHRITIS  . Cataracts, bilateral   . Chronic back pain   . Coronary artery disease 8/11   CABG...LM EMERGENT WITH IABP sees Dr. Legrand Como cooper  . DJD (degenerative joint disease)   . DVT (deep venous thrombosis) ([place])   . Dyspnea   . GERD (gastroesophageal reflux disease)   . History of BPH   . Hyperlipidemia   . Hypertension   . Hypothyroidism   . Myocardial infarction ([place])   . Pulmonary embolism ([place]) 2009   hx of  . RBBB (right bundle branch block)   . Renal artery stenosis ([place])   . Thrombocytopenia ([place])   . Thyroid disease    HYPOTHYROIDISM  . Unstable angina ([place])    NONE IN LONG TIME    Past Surgical History:  Procedure Laterality Date  . Blood clot  Feb.20,  2016   Pulmonary Embolism  . CAROTID ENDARTERECTOMY Right Oct. 17, 2013   CE  . CAROTID ENDARTERECTOMY Left Dec. 3, 2016   CE  . CATARACT EXTRACTION  05/2014,06/2014  . CORONARY ARTERY BYPASS GRAFT  11-21-09   EMERGENT X 3 GRAFTING. ..[name],  MD, cc: [name]  . ENDARTERECTOMY  01/28/2012   Procedure: ENDARTERECTOMY CAROTID;  Surgeon: [name], MD;  Location: [facility];  Service: Vascular;  Laterality: Right;  with Primary Closure of Artery  . ENDARTERECTOMY  03/15/2012   Procedure: ENDARTERECTOMY CAROTID;  Surgeon: [name], MD;  Location: [facility];  Service: Vascular;  Laterality: Left;  . FOOT SURGERY    . MANDIBLE SURGERY    . MAXIMUM ACCESS (MAS)POSTERIOR LUMBAR INTERBODY FUSION (PLIF) 2 LEVEL N/A 08/08/2015   Procedure: Lumbar Four-Five, Lumbar Five-Sacral One Maximum access posterior lumbar interbody fusion;  Surgeon: [name], MD;  Location: [place] [facility];  Service: Neurosurgery;  Laterality: N/A;  LUMBAR FOUR-FIVE ,LUMBAR FIVE -SACRAL Maximum access posterior lumbar interbody fusion  . RENAL ARTERY STENT  2010  . REPLACEMENT TOTAL KNEE  2006   RIGHT  . TOTAL HIP ARTHROPLASTY     right    Family History  Problem Relation Age of Onset  . Lung cancer Father 26       deceased   . Cancer Father 70       LUNG  . Stroke Mother 15       DECEASED   . Hyperlipidemia Mother   . Heart attack Brother 46       deceased  . Diabetes Brother   . Liver  cancer Sister 26       deceased   . Cancer Sister    Social History:  reports that he quit smoking about 43 years ago. He quit smokeless tobacco use about 15 years ago. He reports that he drinks about 1.2 oz of alcohol per week. He reports that he does not use drugs.  Allergies: No Known Allergies  No medications prior to admission.    Results for orders placed or performed during the hospital encounter of 05/19/17 (from the past 48 hour(s))  CBC     Status: Abnormal   Collection Time: 05/19/17  9:47 AM  Result Value Ref Range   WBC 5.7 4.0 - 10.5 K/uL   RBC 4.54 4.22 - 5.81 MIL/uL   Hemoglobin 14.6 13.0 - 17.0 g/dL   HCT 43.8 39.0 - 52.0 %   MCV 96.5 78.0 - 100.0 fL   MCH 32.2 26.0 - 34.0 pg   MCHC 33.3 30.0 - 36.0 g/dL   RDW 12.8 11.5 -  15.5 %   Platelets 146 (L) 150 - 400 K/uL    Comment: Performed at [facility] [address], [place], [place] [address]  Surgical pcr screen     Status: Abnormal   Collection Time: 05/19/17  9:47 AM  Result Value Ref Range   MRSA, PCR NEGATIVE NEGATIVE   Staphylococcus aureus POSITIVE (A) NEGATIVE    Comment: (NOTE) The Xpert SA Assay (FDA approved for NASAL specimens in patients 59 years of age and older), is one component of a comprehensive surveillance program. It is not intended to diagnose infection nor to guide or monitor treatment. Performed at [facility], [place] [address] [place], [place] [address]   Basic metabolic panel     Status: Abnormal   Collection Time: 05/19/17  9:47 AM  Result Value Ref Range   Sodium 139 135 - 145 mmol/L   Potassium 3.8 3.5 - 5.1 mmol/L   Chloride 106 101 - 111 mmol/L   CO2 21 (L) 22 - 32 mmol/L   Glucose, Bld 138 (H) 65 - 99 mg/dL   BUN 13 6 - 20 mg/dL   Creatinine, Ser 1.32 (H) 0.61 - 1.24 mg/dL   Calcium 8.6 (L) 8.9 - 10.3 mg/dL   GFR calc non Af Amer 47 (L) >60 mL/min   GFR calc Af Amer 55 (L) >60 mL/min    Comment: (NOTE) The eGFR has been calculated using the CKD EPI equation. This calculation has not been validated in all clinical situations. eGFR's persistently <60 mL/min signify possible Chronic Kidney Disease.    Anion gap 12 5 - 15    Comment: Performed at [facility] [address], [place], [place] [address]  APTT     Status: Abnormal   Collection Time: 05/19/17  9:47 AM  Result Value Ref Range   aPTT 41 (H) 24 - 36 seconds    Comment:        IF BASELINE aPTT IS ELEVATED, SUGGEST PATIENT RISK ASSESSMENT BE USED TO DETERMINE APPROPRIATE ANTICOAGULANT THERAPY. Performed at [facility], [place] [address], [place], [place] [address]   Protime-INR     Status: Abnormal   Collection Time: 05/19/17  9:47 AM  Result Value Ref Range   Prothrombin Time 23.1 (H) 11.4 - 15.2 seconds   INR 2.06      Comment: Performed at [facility], [place] [address], [place], [place] [address]   No results found.  Review of Systems  Constitutional: Negative.   HENT: Negative.   Eyes: Negative.   Respiratory: Negative.   Cardiovascular: Negative.   Gastrointestinal: Negative.   Genitourinary: Negative.   Musculoskeletal: Negative.   Skin: Negative.   Neurological: Negative.   Endo/Heme/Allergies: Negative.   Psychiatric/Behavioral: Negative.     There were no vitals taken for this visit. Physical Exam  Constitutional: He is oriented to person, place, and time. He appears well-developed and well-nourished.  HENT:  Head: Normocephalic and atraumatic.  Eyes: Conjunctivae are normal. Pupils are equal, round, and reactive to light.  Neck: Normal range of motion.  Cardiovascular: Normal rate and regular rhythm.  Respiratory: Effort normal.  Neurological: He is alert and oriented to person, place, and time.  Skin: Skin is warm and dry.  Psychiatric: He has a normal mood and affect. His behavior is normal. Thought content normal.     Assessment/Plan  1. Lumbar post-laminectomy syndrome 2. Chronic pain syndrome  Plan: Permanent implantation SCS, [employer]  Bonna Gains, MD 05/20/2017, 8:54 AM

## 2017-05-21 ENCOUNTER — Encounter (HOSPITAL_COMMUNITY): Admission: RE | Payer: Self-pay | Source: Ambulatory Visit

## 2017-05-21 ENCOUNTER — Ambulatory Visit (HOSPITAL_COMMUNITY): Admission: RE | Admit: 2017-05-21 | Payer: Medicare Other | Source: Ambulatory Visit | Admitting: Anesthesiology

## 2017-05-21 SURGERY — INSERTION, SPINAL CORD STIMULATOR, LUMBAR
Anesthesia: Monitor Anesthesia Care

## 2017-05-27 ENCOUNTER — Other Ambulatory Visit: Payer: Self-pay | Admitting: Anesthesiology

## 2017-06-15 ENCOUNTER — Other Ambulatory Visit: Payer: Self-pay | Admitting: Cardiovascular Disease

## 2017-06-17 ENCOUNTER — Other Ambulatory Visit: Payer: Self-pay

## 2017-06-17 ENCOUNTER — Encounter (HOSPITAL_COMMUNITY): Payer: Self-pay | Admitting: *Deleted

## 2017-06-17 NOTE — Progress Notes (Signed)
Pt denies any acute cardiopulmonary issues. Pt under the care of Dr. Burt Knack, Cardiology. Pt denies having a cardiac cath and stress test. Pt denies having a chest x ray. Pt denies recent labs. Pt stated that last dose of Aspirin was " 6-7 days ago" and last dose of Xarelto was " 4 days ago." Pt made aware to stop taking vitamins, fish oil and herbal medications. Do not take any NSAIDs ie: Ibuprofen, Advil, Naproxen (Aleve), Motrin, BC and Goody Powder. Pt verbalized understanding of all pre-op instructions. Anesthesia asked to review pt history.

## 2017-06-17 NOTE — H&P (Signed)
Leonard R Vaillancourt Sr. is an 84 y.o. male.   Chief Complaint: Back pain with radiation into the lower extremities HPI: The patient has a history of a previous fusion done with Dr. Vertell Limber in 2017.  Dr. Vertell Limber evaluated the patient on February 03, 2017 and felt that he would also be a good candidate for spinal cord stimulation.  The patient proceeded with his psychiatric evaluation and was ultimately scheduled for trial.   During his trial, hecame in for some reprogramming this week and subsequently reported a 0/10 pain.  He now presents for permanent implantation   Past Medical History:  Diagnosis Date  . Anemia   . Anxiety   . Arrhythmia    PAROXYSMAL ATRIAL FIBRILLATION  . Arthritis    OSTEOARTHRITIS  . Cataracts, bilateral   . Chronic back pain   . Coronary artery disease 8/11   CABG...LM EMERGENT WITH IABP sees Dr. Legrand Como cooper  . DJD (degenerative joint disease)   . DVT (deep venous thrombosis) ([place])   . Dyspnea   . GERD (gastroesophageal reflux disease)   . History of BPH   . Hyperlipidemia   . Hypertension   . Hypothyroidism   . Myocardial infarction ([place])   . Pulmonary embolism ([place]) 2009   hx of  . RBBB (right bundle branch block)   . Renal artery stenosis ([place])   . Thrombocytopenia ([place])   . Thyroid disease    HYPOTHYROIDISM  . Unstable angina ([place])    NONE IN LONG TIME    Past Surgical History:  Procedure Laterality Date  . Blood clot  Feb.20,  2016   Pulmonary Embolism  . CAROTID ENDARTERECTOMY Right Oct. 17, 2013   CE  . CAROTID ENDARTERECTOMY Left Dec. 3, 2016   CE  . CATARACT EXTRACTION  05/2014,06/2014  . CORONARY ARTERY BYPASS GRAFT  11-21-09   EMERGENT X 3 GRAFTING. ..[name], MD, cc: [name]  . ENDARTERECTOMY  01/28/2012   Procedure: ENDARTERECTOMY CAROTID;  Surgeon: [name], MD;  Location: [facility];  Service: Vascular;  Laterality: Right;  with Primary Closure of Artery  . ENDARTERECTOMY  03/15/2012   Procedure:  ENDARTERECTOMY CAROTID;  Surgeon: [name], MD;  Location: [facility];  Service: Vascular;  Laterality: Left;  . FOOT SURGERY    . MANDIBLE SURGERY    . MAXIMUM ACCESS (MAS)POSTERIOR LUMBAR INTERBODY FUSION (PLIF) 2 LEVEL N/A 08/08/2015   Procedure: Lumbar Four-Five, Lumbar Five-Sacral One Maximum access posterior lumbar interbody fusion;  Surgeon: [name], MD;  Location: [facility] NEUR[facility]S;  Service: Neurosurgery;  Laterality: N/A;  LUMBAR FOUR-FIVE ,LUMBAR FIVE -SACRAL Maximum access posterior lumbar interbody fusion  . RENAL ARTERY STENT  2010  . REPLACEMENT TOTAL KNEE  2006   RIGHT  . TOTAL HIP ARTHROPLASTY     right    Family History  Problem Relation Age of Onset  . Lung cancer Father 41       deceased   . Cancer Father 30       LUNG  . Stroke Mother 64       DECEASED   . Hyperlipidemia Mother   . Heart attack Brother 25       deceased  . Diabetes Brother   . Liver cancer Sister 55       deceased   . Cancer Sister    Social History:  reports that he quit smoking about 43 years ago. He quit smokeless tobacco use about 15 years ago. He reports  that he drinks about 1.2 oz of alcohol per week. He reports that he does not use drugs.  Allergies: No Known Allergies  Medications Prior to Admission  Medication Sig Dispense Refill  . acetaminophen (TYLENOL) 500 MG tablet Take 500-1,000 mg by mouth daily as needed for mild pain or headache.     . amLODipine (NORVASC) 5 MG tablet TAKE 1 TABLET BY MOUTH ONCE A DAY (Patient taking differently: Take 5 mg by mouth at bedtime) 90 tablet 1  . aspirin 81 MG tablet Take 162 mg by mouth daily.     . cetirizine (ZYRTEC) 10 MG tablet Take 10 mg by mouth daily.    . docusate sodium (COLACE) 100 MG capsule Take 200 mg by mouth daily as needed for mild constipation.    . furosemide (LASIX) 40 MG tablet Take 1 tablet (40 mg total) by mouth daily. 90 tablet 1  . guaiFENesin (MUCINEX) 600 MG 12 hr tablet Take 600 mg by mouth 2 (two) times  daily as needed for cough or to loosen phlegm.    . levothyroxine (SYNTHROID, LEVOTHROID) 125 MCG tablet Take 1 tablet by mouth every morning on empty stomach with a full glass of water. (Patient taking differently: Take 125 mcg by mouth daily before breakfast. ) 90 tablet 3  . lubiprostone (AMITIZA) 24 MCG capsule Take 24 mcg by mouth 2 (two) times daily with a meal.    . metoprolol succinate (TOPROL-XL) 25 MG 24 hr tablet TAKE 1 TABLET BY MOUTH ONCE A DAY (Patient taking differently: Take 25 mg by mouth daily after supper) 90 tablet 1  . mometasone (NASONEX) 50 MCG/ACT nasal spray Place 2 sprays into the nose daily as needed (for allergies).    . Multiple Vitamins-Minerals (MULTIVITAMIN PO) Take 1 tablet by mouth daily.    . ranitidine (ZANTAC) 150 MG tablet Take 150 mg by mouth 2 (two) times daily as needed for heartburn.     . rosuvastatin (CRESTOR) 10 MG tablet TAKE 1 TABLET BY MOUTH EVERY NIGHT AT. BEDTIME. (Patient taking differently: Take 10 mg by mouth daily after supper. ) 90 tablet 3  . tamsulosin (FLOMAX) 0.4 MG CAPS capsule Take 1 capsule (0.4 mg total) by mouth daily. NEED OFFICE APPT FOR ANY MORE REFILLS (Patient taking differently: Take 0.4 mg by mouth daily after supper. ) 30 capsule 5  . XARELTO 20 MG TABS tablet TAKE 1 TABLET BY MOUTH ONCE A DAY 30 tablet 5  . traMADol (ULTRAM) 50 MG tablet Take 50 mg by mouth every 8 (eight) hours as needed for moderate pain.      Results for orders placed or performed during the hospital encounter of 06/18/17 (from the past 48 hour(s))  CBC     Status: None   Collection Time: 06/18/17  6:33 AM  Result Value Ref Range   WBC 5.8 4.0 - 10.5 K/uL   RBC 4.83 4.22 - 5.81 MIL/uL   Hemoglobin 15.5 13.0 - 17.0 g/dL   HCT 46.8 39.0 - 52.0 %   MCV 96.9 78.0 - 100.0 fL   MCH 32.1 26.0 - 34.0 pg   MCHC 33.1 30.0 - 36.0 g/dL   RDW 13.0 11.5 - 15.5 %   Platelets 174 150 - 400 K/uL    Comment: Performed at [facility], [address]  [place], [place] [address]  Basic metabolic panel     Status: Abnormal   Collection Time: 06/18/17  6:33 AM  Result Value Ref Range     Sodium 141 135 - 145 mmol/L   Potassium 4.0 3.5 - 5.1 mmol/L   Chloride 107 101 - 111 mmol/L   CO2 24 22 - 32 mmol/L   Glucose, Bld 107 (H) 65 - 99 mg/dL   BUN 15 6 - 20 mg/dL   Creatinine, Ser 1.53 (H) 0.61 - 1.24 mg/dL   Calcium 8.9 8.9 - 10.3 mg/dL   GFR calc non Af Amer 40 (L) >60 mL/min   GFR calc Af Amer 46 (L) >60 mL/min    Comment: (NOTE) The eGFR has been calculated using the CKD EPI equation. This calculation has not been validated in all clinical situations. eGFR's persistently <60 mL/min signify possible Chronic Kidney Disease.    Anion gap 10 5 - 15    Comment: Performed at [facility] [address], [place], [place] [address]   No results found.  Review of Systems  Constitutional: Negative.   HENT: Negative.   Eyes: Negative.   Respiratory: Negative.   Cardiovascular: Negative.   Gastrointestinal: Negative.   Genitourinary: Negative.   Musculoskeletal: Positive for back pain and myalgias. Negative for falls.  Skin: Negative.   Neurological: Negative.   Endo/Heme/Allergies: Negative.   Psychiatric/Behavioral: Negative.     Blood pressure 125/66, pulse 72, temperature 97.6 F (36.4 C), temperature source Oral, resp. rate 20, height 6' 2" (1.88 m), weight 108.9 kg (240 lb), SpO2 97 %. Physical Exam  Constitutional: He is oriented to person, place, and time. He appears well-developed and well-nourished.  HENT:  Head: Normocephalic and atraumatic.  Eyes: EOM are normal. Pupils are equal, round, and reactive to light.  Neck: Normal range of motion.  Cardiovascular: Normal rate.  Respiratory: Effort normal.  Musculoskeletal: Normal range of motion.  Neurological: He is alert and oriented to person, place, and time.  Skin: Skin is warm and dry.  Psychiatric: He has a normal mood and affect. His behavior is normal. Judgment  normal.     Assessment/Plan  lumbar post-laminectomy syndrome; chronic pain syndrome  Plan:  Permanent implantation spinal cord stimulator, [address]  Bonna Gains, MD 06/18/2017, 7:26 AM

## 2017-06-18 ENCOUNTER — Ambulatory Visit (HOSPITAL_COMMUNITY): Payer: Medicare Other

## 2017-06-18 ENCOUNTER — Ambulatory Visit (HOSPITAL_COMMUNITY): Payer: Medicare Other | Admitting: Certified Registered Nurse Anesthetist

## 2017-06-18 ENCOUNTER — Encounter (HOSPITAL_COMMUNITY)
Admission: RE | Disposition: A | Discharge status: Home / Self Care | Payer: Self-pay | Source: Ambulatory Visit | Attending: Anesthesiology

## 2017-06-18 ENCOUNTER — Ambulatory Visit (HOSPITAL_COMMUNITY)
Admission: RE | Admit: 2017-06-18 | Discharge: 2017-06-18 | Disposition: A | Discharge status: Home / Self Care | Payer: Medicare Other | Source: Ambulatory Visit | Attending: Anesthesiology | Admitting: Anesthesiology

## 2017-06-18 ENCOUNTER — Encounter (HOSPITAL_COMMUNITY): Payer: Self-pay | Admitting: *Deleted

## 2017-06-18 DIAGNOSIS — I480 Paroxysmal atrial fibrillation: Secondary | ICD-10-CM | POA: Insufficient documentation

## 2017-06-18 DIAGNOSIS — M5416 Radiculopathy, lumbar region: Secondary | ICD-10-CM | POA: Insufficient documentation

## 2017-06-18 DIAGNOSIS — I499 Cardiac arrhythmia, unspecified: Secondary | ICD-10-CM | POA: Diagnosis not present

## 2017-06-18 DIAGNOSIS — Z809 Family history of malignant neoplasm, unspecified: Secondary | ICD-10-CM | POA: Insufficient documentation

## 2017-06-18 DIAGNOSIS — I2510 Atherosclerotic heart disease of native coronary artery without angina pectoris: Secondary | ICD-10-CM | POA: Diagnosis not present

## 2017-06-18 DIAGNOSIS — Z8249 Family history of ischemic heart disease and other diseases of the circulatory system: Secondary | ICD-10-CM | POA: Insufficient documentation

## 2017-06-18 DIAGNOSIS — M961 Postlaminectomy syndrome, not elsewhere classified: Secondary | ICD-10-CM | POA: Insufficient documentation

## 2017-06-18 DIAGNOSIS — Z823 Family history of stroke: Secondary | ICD-10-CM | POA: Insufficient documentation

## 2017-06-18 DIAGNOSIS — Z96641 Presence of right artificial hip joint: Secondary | ICD-10-CM | POA: Insufficient documentation

## 2017-06-18 DIAGNOSIS — Z86711 Personal history of pulmonary embolism: Secondary | ICD-10-CM | POA: Diagnosis not present

## 2017-06-18 DIAGNOSIS — Z7982 Long term (current) use of aspirin: Secondary | ICD-10-CM | POA: Insufficient documentation

## 2017-06-18 DIAGNOSIS — F419 Anxiety disorder, unspecified: Secondary | ICD-10-CM | POA: Insufficient documentation

## 2017-06-18 DIAGNOSIS — D649 Anemia, unspecified: Secondary | ICD-10-CM | POA: Insufficient documentation

## 2017-06-18 DIAGNOSIS — I10 Essential (primary) hypertension: Secondary | ICD-10-CM | POA: Insufficient documentation

## 2017-06-18 DIAGNOSIS — Z86718 Personal history of other venous thrombosis and embolism: Secondary | ICD-10-CM | POA: Insufficient documentation

## 2017-06-18 DIAGNOSIS — G894 Chronic pain syndrome: Secondary | ICD-10-CM | POA: Insufficient documentation

## 2017-06-18 DIAGNOSIS — M4316 Spondylolisthesis, lumbar region: Secondary | ICD-10-CM | POA: Diagnosis not present

## 2017-06-18 DIAGNOSIS — Z7901 Long term (current) use of anticoagulants: Secondary | ICD-10-CM | POA: Insufficient documentation

## 2017-06-18 DIAGNOSIS — D696 Thrombocytopenia, unspecified: Secondary | ICD-10-CM | POA: Insufficient documentation

## 2017-06-18 DIAGNOSIS — E039 Hypothyroidism, unspecified: Secondary | ICD-10-CM | POA: Insufficient documentation

## 2017-06-18 DIAGNOSIS — Z951 Presence of aortocoronary bypass graft: Secondary | ICD-10-CM | POA: Insufficient documentation

## 2017-06-18 DIAGNOSIS — I4510 Unspecified right bundle-branch block: Secondary | ICD-10-CM | POA: Insufficient documentation

## 2017-06-18 DIAGNOSIS — M545 Low back pain: Secondary | ICD-10-CM | POA: Diagnosis present

## 2017-06-18 DIAGNOSIS — I739 Peripheral vascular disease, unspecified: Secondary | ICD-10-CM | POA: Insufficient documentation

## 2017-06-18 DIAGNOSIS — Z801 Family history of malignant neoplasm of trachea, bronchus and lung: Secondary | ICD-10-CM | POA: Insufficient documentation

## 2017-06-18 DIAGNOSIS — M1990 Unspecified osteoarthritis, unspecified site: Secondary | ICD-10-CM | POA: Insufficient documentation

## 2017-06-18 DIAGNOSIS — Z833 Family history of diabetes mellitus: Secondary | ICD-10-CM | POA: Insufficient documentation

## 2017-06-18 DIAGNOSIS — I252 Old myocardial infarction: Secondary | ICD-10-CM | POA: Insufficient documentation

## 2017-06-18 DIAGNOSIS — R0600 Dyspnea, unspecified: Secondary | ICD-10-CM | POA: Diagnosis not present

## 2017-06-18 DIAGNOSIS — N400 Enlarged prostate without lower urinary tract symptoms: Secondary | ICD-10-CM | POA: Insufficient documentation

## 2017-06-18 DIAGNOSIS — Z9842 Cataract extraction status, left eye: Secondary | ICD-10-CM | POA: Diagnosis not present

## 2017-06-18 DIAGNOSIS — Z79899 Other long term (current) drug therapy: Secondary | ICD-10-CM | POA: Insufficient documentation

## 2017-06-18 DIAGNOSIS — K219 Gastro-esophageal reflux disease without esophagitis: Secondary | ICD-10-CM | POA: Diagnosis not present

## 2017-06-18 DIAGNOSIS — Z419 Encounter for procedure for purposes other than remedying health state, unspecified: Secondary | ICD-10-CM

## 2017-06-18 DIAGNOSIS — E785 Hyperlipidemia, unspecified: Secondary | ICD-10-CM | POA: Insufficient documentation

## 2017-06-18 DIAGNOSIS — Z981 Arthrodesis status: Secondary | ICD-10-CM | POA: Diagnosis not present

## 2017-06-18 DIAGNOSIS — N289 Disorder of kidney and ureter, unspecified: Secondary | ICD-10-CM | POA: Insufficient documentation

## 2017-06-18 DIAGNOSIS — Z800 Family history of malignant neoplasm of digestive organs: Secondary | ICD-10-CM | POA: Insufficient documentation

## 2017-06-18 HISTORY — PX: SPINAL CORD STIMULATOR INSERTION: SHX5378

## 2017-06-18 HISTORY — DX: Postlaminectomy syndrome, not elsewhere classified: M96.1

## 2017-06-18 LAB — CBC
HCT: 46.8 % (ref 39.0–52.0)
Hemoglobin: 15.5 g/dL (ref 13.0–17.0)
MCH: 32.1 pg (ref 26.0–34.0)
MCHC: 33.1 g/dL (ref 30.0–36.0)
MCV: 96.9 fL (ref 78.0–100.0)
Platelets: 174 10*3/uL (ref 150–400)
RBC: 4.83 MIL/uL (ref 4.22–5.81)
RDW: 13 % (ref 11.5–15.5)
WBC: 5.8 10*3/uL (ref 4.0–10.5)

## 2017-06-18 LAB — BASIC METABOLIC PANEL
Anion gap: 10 (ref 5–15)
BUN: 15 mg/dL (ref 6–20)
CO2: 24 mmol/L (ref 22–32)
Calcium: 8.9 mg/dL (ref 8.9–10.3)
Chloride: 107 mmol/L (ref 101–111)
Creatinine, Ser: 1.53 mg/dL — ABNORMAL HIGH (ref 0.61–1.24)
GFR calc Af Amer: 46 mL/min — ABNORMAL LOW (ref >60)
GFR calc non Af Amer: 40 mL/min — ABNORMAL LOW (ref >60)
Glucose, Bld: 107 mg/dL — ABNORMAL HIGH (ref 65–99)
Potassium: 4 mmol/L (ref 3.5–5.1)
Sodium: 141 mmol/L (ref 135–145)

## 2017-06-18 SURGERY — INSERTION, SPINAL CORD STIMULATOR, LUMBAR
Anesthesia: General

## 2017-06-18 MED ORDER — CEFAZOLIN SODIUM-DEXTROSE 2-4 GM/100ML-% IV SOLN
2.0000 g | INTRAVENOUS | Status: AC
  Administered 2017-06-18: 2 g via INTRAVENOUS

## 2017-06-18 MED ORDER — CEPHALEXIN 500 MG PO CAPS: 500.0000 mg | ORAL_CAPSULE | Freq: Three times a day (TID) | ORAL | 0 refills | Status: DC

## 2017-06-18 MED ORDER — MIDAZOLAM HCL 2 MG/2ML IJ SOLN
INTRAMUSCULAR | Status: AC
  Filled 2017-06-18: qty 2

## 2017-06-18 MED ORDER — HYDROCODONE-ACETAMINOPHEN 5-325 MG PO TABS: 1.0000 | ORAL_TABLET | ORAL | 0 refills | Status: DC | PRN

## 2017-06-18 MED ORDER — XARELTO 20 MG PO TABS: 20.0000 mg | ORAL_TABLET | Freq: Every day | ORAL | 5 refills | Status: DC

## 2017-06-18 MED ORDER — LACTATED RINGERS IV SOLN
INTRAVENOUS | Status: DC | PRN
  Administered 2017-06-18: 07:00:00 via INTRAVENOUS

## 2017-06-18 MED ORDER — BUPIVACAINE HCL (PF) 0.5 % IJ SOLN
INTRAMUSCULAR | Status: AC
  Filled 2017-06-18: qty 30

## 2017-06-18 MED ORDER — CHLORHEXIDINE GLUCONATE CLOTH 2 % EX PADS: 6.0000 | MEDICATED_PAD | Freq: Once | CUTANEOUS | Status: DC

## 2017-06-18 MED ORDER — PROPOFOL 10 MG/ML IV BOLUS
INTRAVENOUS | Status: DC | PRN
  Administered 2017-06-18: 120 mg via INTRAVENOUS

## 2017-06-18 MED ORDER — PROPOFOL 10 MG/ML IV BOLUS
INTRAVENOUS | Status: AC
  Filled 2017-06-18: qty 20

## 2017-06-18 MED ORDER — EPHEDRINE SULFATE 50 MG/ML IJ SOLN
INTRAMUSCULAR | Status: DC | PRN
  Administered 2017-06-18: 10 mg via INTRAVENOUS
  Administered 2017-06-18 (×2): 5 mg via INTRAVENOUS

## 2017-06-18 MED ORDER — FENTANYL CITRATE (PF) 100 MCG/2ML IJ SOLN
INTRAMUSCULAR | Status: DC | PRN
  Administered 2017-06-18: 50 ug via INTRAVENOUS
  Administered 2017-06-18: 25 ug via INTRAVENOUS
  Administered 2017-06-18: 125 ug via INTRAVENOUS

## 2017-06-18 MED ORDER — ONDANSETRON HCL 4 MG/2ML IJ SOLN
INTRAMUSCULAR | Status: DC | PRN
  Administered 2017-06-18: 4 mg via INTRAVENOUS

## 2017-06-18 MED ORDER — SODIUM CHLORIDE 0.9 % IR SOLN
Status: DC | PRN
  Administered 2017-06-18: 08:00:00

## 2017-06-18 MED ORDER — LIDOCAINE HCL (CARDIAC) 20 MG/ML IV SOLN
INTRAVENOUS | Status: DC | PRN
  Administered 2017-06-18: 60 mg via INTRAVENOUS

## 2017-06-18 MED ORDER — BUPIVACAINE-EPINEPHRINE (PF) 0.5% -1:200000 IJ SOLN
INTRAMUSCULAR | Status: DC | PRN
  Administered 2017-06-18: 30 mL via PERINEURAL

## 2017-06-18 MED ORDER — ROCURONIUM BROMIDE 100 MG/10ML IV SOLN
INTRAVENOUS | Status: DC | PRN
  Administered 2017-06-18: 50 mg via INTRAVENOUS

## 2017-06-18 MED ORDER — FENTANYL CITRATE (PF) 250 MCG/5ML IJ SOLN
INTRAMUSCULAR | Status: AC
  Filled 2017-06-18: qty 5

## 2017-06-18 MED ORDER — 0.9 % SODIUM CHLORIDE (POUR BTL) OPTIME
TOPICAL | Status: DC | PRN
  Administered 2017-06-18: 1000 mL

## 2017-06-18 MED ORDER — BACITRACIN-NEOMYCIN-POLYMYXIN OINTMENT TUBE
TOPICAL_OINTMENT | CUTANEOUS | Status: DC | PRN
  Administered 2017-06-18: 1 via TOPICAL

## 2017-06-18 MED ORDER — FENTANYL CITRATE (PF) 100 MCG/2ML IJ SOLN: 25.0000 ug | INTRAMUSCULAR | Status: DC | PRN

## 2017-06-18 MED ORDER — SUGAMMADEX SODIUM 200 MG/2ML IV SOLN
INTRAVENOUS | Status: DC | PRN
  Administered 2017-06-18: 200 mg via INTRAVENOUS

## 2017-06-18 MED ORDER — DEXAMETHASONE SODIUM PHOSPHATE 10 MG/ML IJ SOLN
INTRAMUSCULAR | Status: DC | PRN
  Administered 2017-06-18: 10 mg via INTRAVENOUS

## 2017-06-18 MED ORDER — BACITRACIN-NEOMYCIN-POLYMYXIN 400-5-5000 EX OINT
TOPICAL_OINTMENT | CUTANEOUS | Status: AC
  Filled 2017-06-18: qty 1

## 2017-06-18 SURGICAL SUPPLY — 65 items
ADH SKN CLS APL DERMABOND .7 (GAUZE/BANDAGES/DRESSINGS)
ANCH LD 4 SETX2 CLIK X (Stimulator) ×1 IMPLANT
ANCHOR CLIK X NEURO (Stimulator) ×1 IMPLANT
APL SKNCLS STERI-STRIP NONHPOA (GAUZE/BANDAGES/DRESSINGS)
BAG DECANTER FOR FLEXI CONT (MISCELLANEOUS) ×2 IMPLANT
BENZOIN TINCTURE PRP APPL 2/3 (GAUZE/BANDAGES/DRESSINGS) IMPLANT
BINDER ABDOMINAL 12 ML 46-62 (SOFTGOODS) ×2 IMPLANT
BLADE CLIPPER SURG (BLADE) IMPLANT
CHLORAPREP W/TINT 26ML (MISCELLANEOUS) ×2 IMPLANT
CLIP VESOCCLUDE SM WIDE 6/CT (CLIP) IMPLANT
DERMABOND ADVANCED (GAUZE/BANDAGES/DRESSINGS)
DERMABOND ADVANCED .7 DNX12 (GAUZE/BANDAGES/DRESSINGS) ×1 IMPLANT
DRAPE C-ARM 42X72 X-RAY (DRAPES) ×2 IMPLANT
DRAPE C-ARMOR (DRAPES) ×2 IMPLANT
DRAPE LAPAROTOMY 100X72X124 (DRAPES) ×2 IMPLANT
DRAPE POUCH INSTRU U-SHP 10X18 (DRAPES) ×1 IMPLANT
DRAPE SURG 17X23 STRL (DRAPES) ×2 IMPLANT
DRSG OPSITE POSTOP 3X4 (GAUZE/BANDAGES/DRESSINGS) ×2 IMPLANT
ELECT REM PT RETURN 9FT ADLT (ELECTROSURGICAL) ×2
ELECTRODE REM PT RTRN 9FT ADLT (ELECTROSURGICAL) ×1 IMPLANT
GAUZE SPONGE 4X4 16PLY XRAY LF (GAUZE/BANDAGES/DRESSINGS) ×1 IMPLANT
GENERATOR NEUROSTIMULATOR (Neurostimulator) ×1 IMPLANT
GLOVE BIOGEL PI IND STRL 7.5 (GLOVE) ×1 IMPLANT
GLOVE BIOGEL PI INDICATOR 7.5 (GLOVE) ×1
GLOVE ECLIPSE 7.5 STRL STRAW (GLOVE) ×2 IMPLANT
GLOVE EXAM NITRILE LRG STRL (GLOVE) IMPLANT
GLOVE EXAM NITRILE XL STR (GLOVE) IMPLANT
GLOVE EXAM NITRILE XS STR PU (GLOVE) IMPLANT
GOWN STRL REUS W/ TWL LRG LVL3 (GOWN DISPOSABLE) IMPLANT
GOWN STRL REUS W/ TWL XL LVL3 (GOWN DISPOSABLE) IMPLANT
GOWN STRL REUS W/TWL 2XL LVL3 (GOWN DISPOSABLE) IMPLANT
GOWN STRL REUS W/TWL LRG LVL3 (GOWN DISPOSABLE)
GOWN STRL REUS W/TWL XL LVL3 (GOWN DISPOSABLE)
KIT BASIN OR (CUSTOM PROCEDURE TRAY) ×2 IMPLANT
KIT CHARGING (KITS) ×1
KIT CHARGING PRECISION NEURO (KITS) IMPLANT
KIT REMOTE CONTROL 112802 FREE (KITS) ×1 IMPLANT
KIT ROOM TURNOVER OR (KITS) ×2 IMPLANT
LEAD INFINION CX PERC 70CM (Lead) ×2 IMPLANT
NDL 18GX1X1/2 (RX/OR ONLY) (NEEDLE) IMPLANT
NDL ENTRADA 4.5IN (NEEDLE) IMPLANT
NDL HYPO 25X1 1.5 SAFETY (NEEDLE) ×1 IMPLANT
NEEDLE 18GX1X1/2 (RX/OR ONLY) (NEEDLE) IMPLANT
NEEDLE ENTRADA 4.5IN (NEEDLE) ×4 IMPLANT
NEEDLE HYPO 25X1 1.5 SAFETY (NEEDLE) ×2 IMPLANT
NS IRRIG 1000ML POUR BTL (IV SOLUTION) ×2 IMPLANT
PACK LAMINECTOMY NEURO (CUSTOM PROCEDURE TRAY) ×2 IMPLANT
PAD ARMBOARD 7.5X6 YLW CONV (MISCELLANEOUS) ×2 IMPLANT
SPONGE LAP 4X18 X RAY DECT (DISPOSABLE) ×1 IMPLANT
SPONGE SURGIFOAM ABS GEL SZ50 (HEMOSTASIS) IMPLANT
STAPLER SKIN PROX WIDE 3.9 (STAPLE) ×2 IMPLANT
STRIP CLOSURE SKIN 1/2X4 (GAUZE/BANDAGES/DRESSINGS) IMPLANT
SUT MNCRL AB 4-0 PS2 18 (SUTURE) IMPLANT
SUT SILK 0 (SUTURE) ×2
SUT SILK 0 MO-6 18XCR BRD 8 (SUTURE) ×1 IMPLANT
SUT SILK 0 TIES 10X30 (SUTURE) IMPLANT
SUT SILK 2 0 TIES 10X30 (SUTURE) IMPLANT
SUT VIC AB 2-0 CP2 18 (SUTURE) ×5 IMPLANT
SYR 10ML LL (SYRINGE) IMPLANT
SYR EPIDURAL 5ML GLASS (SYRINGE) ×2 IMPLANT
TOOL LONG TUNNEL (SPINAL CORD STIMULATOR) ×1 IMPLANT
TOWEL GREEN STERILE (TOWEL DISPOSABLE) ×2 IMPLANT
TOWEL GREEN STERILE FF (TOWEL DISPOSABLE) ×2 IMPLANT
WATER STERILE IRR 1000ML POUR (IV SOLUTION) ×2 IMPLANT
YANKAUER SUCT BULB TIP NO VENT (SUCTIONS) ×2 IMPLANT

## 2017-06-18 NOTE — Transfer of Care (Signed)
Immediate Anesthesia Transfer of Care Note  Patient: Leonard [name]  Procedure(s) Performed: LUMBAR SPINAL CORD STIMULATOR INSERTION (N/A )  Patient Location: PACU  Anesthesia Type:General  Level of Consciousness: awake, alert , oriented and patient cooperative  Airway & Oxygen Therapy: Patient Spontanous Breathing and Patient connected to nasal cannula oxygen  Post-op Assessment: Report given to RN and Post -op Vital signs reviewed and stable  Post vital signs: Reviewed and stable  Last Vitals:  Vitals:   06/18/17 0608  BP: 125/66  Pulse: 72  Resp: 20  Temp: 36.4 C  SpO2: 97%    Last Pain:  Vitals:   06/18/17 0608  TempSrc: Oral         Complications: No apparent anesthesia complications

## 2017-06-18 NOTE — Discharge Instructions (Addendum)
Dr. Maryjean Ka Post-Op Orders   Ice Pack - 20 minutes on (in a pillow case), and 20 minutes off. Wear the ice pack UNDER the binder.  Follow up in office, they will call you for an appointment in 10 days to 2 weeks.  Increase activity gradually.    No lifting anything heavier than a gallon of milk (10 pounds) until seen in the office.  Advance diet slowly as tolerated.  Dressing care:  Keep dressing dry for 3 days, then may remove dressings and shower.  Call for fever, drainage, and redness.  No swimming or bathing in a bathtub (do not get into standing water).  DO NOT RESTART Kieth Brightly MARCH 11 (Monday)

## 2017-06-18 NOTE — Anesthesia Procedure Notes (Signed)
Procedure Name: Intubation Date/Time: 06/18/2017 7:40 AM Performed by: Shirlyn Goltz, CRNA Pre-anesthesia Checklist: Patient identified, Emergency Drugs available, Suction available and Patient being monitored Patient Re-evaluated:Patient Re-evaluated prior to induction Oxygen Delivery Method: Circle system utilized Preoxygenation: Pre-oxygenation with 100% oxygen Induction Type: IV induction Ventilation: Mask ventilation without difficulty Laryngoscope Size: Mac and 4 Grade View: Grade I Tube type: Oral Tube size: 7.5 mm Number of attempts: 1 Airway Equipment and Method: Stylet Placement Confirmation: ETT inserted through vocal cords under direct vision,  positive ETCO2 and breath sounds checked- equal and bilateral Secured at: 21 cm Tube secured with: Tape Dental Injury: Teeth and Oropharynx as per pre-operative assessment

## 2017-06-18 NOTE — Anesthesia Postprocedure Evaluation (Signed)
Anesthesia Post Note  Patient: Leonard [name]  Procedure(s) Performed: LUMBAR SPINAL CORD STIMULATOR INSERTION (N/A )     Patient location during evaluation: PACU Anesthesia Type: General Level of consciousness: awake and alert Pain management: pain level controlled Vital Signs Assessment: post-procedure vital signs reviewed and stable Respiratory status: spontaneous breathing, nonlabored ventilation, respiratory function stable and patient connected to nasal cannula oxygen Cardiovascular status: blood pressure returned to baseline and stable Postop Assessment: no apparent nausea or vomiting Anesthetic complications: no    Last Vitals:  Vitals:   06/18/17 1030 06/18/17 1106  BP: (!) 154/66 (!) 145/79  Pulse: 76 73  Resp: 17 16  Temp: 36.4 C   SpO2: 93% 97%    Last Pain:  Vitals:   06/18/17 1106  TempSrc:   PainSc: 0-No pain                 [name]

## 2017-06-18 NOTE — Op Note (Signed)
PREOP DX: 1) chronic pain syndrome 2)lumbago with radiculopathy 3) lumbar postlaminectomy syndrome  POSTOP UU:VOZD as preop PROCEDURES PERFORMED:1) intraop fluoro 2) placement of 2 16 contact [employer] Infinion leads 3) placement of Spectra SCS generator 4) post op complex SCS programming SURGEON:Vashawn Ekstein  ASSISTANT: NONE  ANESTHESIA:GETA EBL: <20cc  DESCRIPTION OF PROCEDURE: After a discussion of risks, benefits and alternatives, informed consent was obtained. The patient was taken to the OR,general anesthesia induced,turned prone onto a Jackson table, all pressure points padded, SCD's placed. A timeout was taken to verify the correct patient, position, personnel, availability of appropriate equipment, and administration of perioperative antibiotics.  The thoracic and lumbar areas were widely prepped with chloraprep and draped into a sterile field. Fluoroscopy was used to plan arightparamedian incision at theT12-L2 levels, and an incision made with a 10 blade and carried down to the dorsolumbar fascia with the bovie and blunt dissection. Retractors were placed and a 14g [employer] tuohy needle placed into the epidural space at the T12-L1interspace using biplanar fluoro and loss-of-resistance technique. The needle was aspirated without any return of fluid. A [employer] INFINION lead was introduced and under live AP fluoro advanced until the distal-most 2 contacts overlay theinferioraspectof theT7vertebral body shadow with the rest of the contacts distributed over the [employer] bodies in a position just right of anatomic midline. A second Infinion lead was placed just left of anatomic midline in the same levels using the same technique. 0 silk sutures were placed in the fascia adjacent to the needles. The needles and stylets were removed under fluoroscopy with no lead migration noted. Leads were then fixed to the fascia byClik anchorswith the sutures;  repeat images were obtained to verify that there had been no lead migration. The incision was inspected and hemostasis obtained with the bipolar cautery.    Attention was then turned to creation of a subcutaneous pocket. At therightflank, a 3 cm incision was made with a 10 blade and using the bovie and blunt dissection a pocket of size appropriate to place a SCS generator. The pocket was trialed, and found to be of adequate size. The pocket was inspected for hemostasis, which was found to be excellent. Using reverse seldinger technique, the leads were tunneled to the pocket site, and the leads inserted into the SCS generator. Impedances were checked, and all found to be excellent. The leads were then all fixed into position with a self-torquing wrench. The wiring was all carefully coiled, placed behind the generator and placed in the pocket.  Both incisions were copiously irrigated with bacitracin-containing irrigation. The lumbar incision was closed in 2 deep layers of interrupted 2-0 vicryl and the skin closed with staples. The pocket incision was closed with a deeper layer of 2-0 vicryl interrupted sutures, and the skin closed with staples. Sterile dressings were applied. Needle, sponge, and instrument counts were correct x2 at the end of the case.  The patient was then carefully awakened from anesthesia, turned supine, an abdominal binder placed, and the patient taken to the recovery room where he underwent complex spinal cord stimulator programming.  COMPLICATIONS: NONE  CONDITION: Stable throughout the course of the procedure and immediately afterward  DISPOSITION: discharge to home, with antibiotics and pain medicine. Discussed care with the patient and spouse. Followup in clinic will be scheduled in 10-14 days.

## 2017-06-18 NOTE — Anesthesia Preprocedure Evaluation (Addendum)
Anesthesia Evaluation   Patient awake    Reviewed: Allergy & Precautions, NPO status , Patient's Chart, lab work & pertinent test results  History of Anesthesia Complications Negative for: history of anesthetic complications  Airway Mallampati: II  TM Distance: >3 FB Neck ROM: Full    Dental  (+) Upper Dentures, Lower Dentures   Pulmonary neg shortness of breath, neg COPD, neg recent URI, former smoker, PE H/o PE/DVT on Xarelto (held)   breath sounds clear to auscultation       Cardiovascular hypertension, Pt. on medications and Pt. on home beta blockers (-) angina+ CAD, + Past MI, + CABG and + Peripheral Vascular Disease   Rhythm:Regular     Neuro/Psych PSYCHIATRIC DISORDERS Anxiety negative neurological ROS     GI/Hepatic GERD  Controlled,  Endo/Other  Hypothyroidism   Renal/GU Renal disease     Musculoskeletal  (+) Arthritis ,   Abdominal   Peds  Hematology negative hematology ROS (+)   Anesthesia Other Findings Pt is an 84 year old male scheduled for lumbar spinal cord stimulator insertion on 05/21/2017 with Leonard Hakim, MD  - PCP is Alma Friendly, NP - Cardiologist is [name], MD.  Cleared for surgery at last office visit 05/05/17 with Richardson Dopp, [place] - Vascular surgeon is Deitra Mayo, MD. Last office visit 03/24/17.   PMH includes:  CAD (s/p CABG 2011), PAF, RBBB, HTN, hyperlipidemia, anemia, DVT, PE, hypothyroidism, renal artery stenosis (s/p stent 2010), thrombocytopenia, GERD.  Former smoker.  BMI 30.5.  S/p PLIF 08/08/15. S/p L CEA 03/15/12.  S/p R CEA 01/28/12.   Medications include: Amlodipine, ASA 81 mg, Lasix, levothyroxine, metoprolol, Zantac, rosuvastatin, xarelto. Pt to hold xarelto 72 hours, and ASA 7 days before surgery  BP (!) 148/57   Pulse 86   Temp 36.4 C   Resp 20   Ht 6\' 2"  (1.88 m)   Wt 237 lb (107.5 kg)   SpO2 95%   BMI 30.43 kg/m   Preoperative labs  reviewed.   - PT 23.1, PTT 41.  Will repeat day of surgery.   EKG 05/05/17: Sinus rhythm, PAC.  RBBB.   Carotid duplex 11/06/16:  - Patent bilateral carotid endarterectomy sites with no evidence of restenosis  Echo 08/28/16:  - Left ventricle: The cavity size was mildly dilated. There wasmild concentric hypertrophy. Systolic function was normal. Theestimated ejection fraction was in the range of 55% to 60%. Wallmotion was normal; there were no regional wall motionabnormalities. Features are consistent with a pseudonormal leftventricular filling pattern, with concomitant abnormal relaxationand increased filling pressure (grade 2 diastolic dysfunction). - Aortic valve: Trileaflet; mildly thickened, mildly calcifiedleaflets. - Mitral valve: Minimal focal calcification of the anterior leaflet(medial segment(s)). - Tricuspid valve: There was trivial regurgitation.  If labs acceptable day of surgery, I anticipate pt can proceed with surgery as scheduled.     Reproductive/Obstetrics                             Anesthesia Physical Anesthesia Plan  ASA: III  Anesthesia Plan: General   Post-op Pain Management:    Induction: Intravenous  PONV Risk Score and Plan: 2 and Treatment may vary due to age or medical condition, Ondansetron and Dexamethasone  Airway Management Planned: Oral ETT  Additional Equipment: None  Intra-op Plan:   Post-operative Plan: Extubation in OR  Informed Consent: I have reviewed the patients History and Physical, chart, labs and discussed the procedure including the risks,  benefits and alternatives for the proposed anesthesia with the patient or authorized representative who has indicated his/her understanding and acceptance.   Dental advisory given  Plan Discussed with: CRNA and Surgeon  Anesthesia Plan Comments:        Anesthesia Quick Evaluation

## 2017-06-21 ENCOUNTER — Encounter (HOSPITAL_COMMUNITY): Payer: Self-pay | Admitting: Anesthesiology

## 2017-06-21 DIAGNOSIS — M961 Postlaminectomy syndrome, not elsewhere classified: Secondary | ICD-10-CM | POA: Diagnosis not present

## 2017-08-26 DIAGNOSIS — H26492 Other secondary cataract, left eye: Secondary | ICD-10-CM | POA: Diagnosis not present

## 2017-09-01 DIAGNOSIS — Z9689 Presence of other specified functional implants: Secondary | ICD-10-CM | POA: Diagnosis not present

## 2017-09-10 ENCOUNTER — Other Ambulatory Visit: Payer: Self-pay

## 2017-09-10 DIAGNOSIS — I6523 Occlusion and stenosis of bilateral carotid arteries: Secondary | ICD-10-CM

## 2017-09-10 DIAGNOSIS — I779 Disorder of arteries and arterioles, unspecified: Secondary | ICD-10-CM

## 2017-09-17 ENCOUNTER — Other Ambulatory Visit: Payer: Self-pay | Admitting: Primary Care

## 2017-09-21 DIAGNOSIS — M4316 Spondylolisthesis, lumbar region: Secondary | ICD-10-CM | POA: Diagnosis not present

## 2017-09-21 DIAGNOSIS — M5126 Other intervertebral disc displacement, lumbar region: Secondary | ICD-10-CM | POA: Diagnosis not present

## 2017-09-21 DIAGNOSIS — Z9689 Presence of other specified functional implants: Secondary | ICD-10-CM | POA: Diagnosis not present

## 2017-09-22 ENCOUNTER — Ambulatory Visit: Payer: Medicare Other | Admitting: Vascular Surgery

## 2017-09-22 ENCOUNTER — Ambulatory Visit (HOSPITAL_COMMUNITY)
Admission: RE | Admit: 2017-09-22 | Discharge: 2017-09-22 | Disposition: A | Discharge status: Home / Self Care | Payer: Medicare Other | Source: Ambulatory Visit | Attending: Vascular Surgery | Admitting: Vascular Surgery

## 2017-09-22 ENCOUNTER — Encounter: Payer: Self-pay | Admitting: Vascular Surgery

## 2017-09-22 ENCOUNTER — Ambulatory Visit (INDEPENDENT_AMBULATORY_CARE_PROVIDER_SITE_OTHER)
Admission: RE | Admit: 2017-09-22 | Discharge: 2017-09-22 | Disposition: A | Discharge status: Home / Self Care | Payer: Medicare Other | Source: Ambulatory Visit | Attending: Vascular Surgery | Admitting: Vascular Surgery

## 2017-09-22 ENCOUNTER — Other Ambulatory Visit: Payer: Self-pay

## 2017-09-22 VITALS — BP 148/68 | HR 61 | Resp 20 | Ht 74.0 in | Wt 239.0 lb

## 2017-09-22 DIAGNOSIS — Z87891 Personal history of nicotine dependence: Secondary | ICD-10-CM | POA: Diagnosis not present

## 2017-09-22 DIAGNOSIS — I6523 Occlusion and stenosis of bilateral carotid arteries: Secondary | ICD-10-CM

## 2017-09-22 DIAGNOSIS — I739 Peripheral vascular disease, unspecified: Secondary | ICD-10-CM

## 2017-09-22 DIAGNOSIS — E785 Hyperlipidemia, unspecified: Secondary | ICD-10-CM | POA: Diagnosis not present

## 2017-09-22 DIAGNOSIS — I10 Essential (primary) hypertension: Secondary | ICD-10-CM | POA: Insufficient documentation

## 2017-09-22 DIAGNOSIS — Z09 Encounter for follow-up examination after completed treatment for conditions other than malignant neoplasm: Secondary | ICD-10-CM | POA: Insufficient documentation

## 2017-09-22 DIAGNOSIS — Z9889 Other specified postprocedural states: Secondary | ICD-10-CM | POA: Insufficient documentation

## 2017-09-22 DIAGNOSIS — I779 Disorder of arteries and arterioles, unspecified: Secondary | ICD-10-CM | POA: Diagnosis not present

## 2017-09-22 NOTE — Progress Notes (Signed)
Patient name: Leonard FULGHAM Sr. MRN: [id number] DOB: 1929/07/02 Sex: male  REASON FOR VISIT:   Follow-up of carotid disease and peripheral vascular disease.  HPI:   Leonard SIEDSCHLAG Sr. is a pleasant 84 y.o. male who I last saw on 09/16/2016 with bilateral leg pain.  I had previously performed bilateral carotid endarterectomies on the patient in 2013.  He comes in today for a yearly carotid duplex scan and also follow-up ABIs.  When I saw him a year ago, I felt that based on his exam he did have evidence of possibly some aortoiliac occlusive disease and infrainguinal arterial occlusive disease.  However his symptoms could also partially be attributed to his back issues.  We considered possibly arteriography but the patient felt that his symptoms were tolerable and we decided to continue with a conservative approach.  He comes in for a one-year follow-up visit.  Since I saw him last, he denies any claudication.  However, I think his activity is fairly limited.  He does experience pain in the left foot which is brought on by ambulation and relieved with rest.  This pain is limited to the foot and is likely related to his for previous operations on the foot.  He denies any history of rest pain or history of nonhealing ulcers.  He denies any history of stroke, TIAs, expressive or receptive aphasia, or amaurosis fugax.  Past Medical History:  Diagnosis Date  . Anemia   . Anxiety   . Arrhythmia    PAROXYSMAL ATRIAL FIBRILLATION  . Arthritis    OSTEOARTHRITIS  . Cataracts, bilateral   . Chronic back pain   . Coronary artery disease 8/11   CABG...LM EMERGENT WITH IABP sees Dr. Legrand Como cooper  . DJD (degenerative joint disease)   . DVT (deep venous thrombosis) ([place])   . Dyspnea   . GERD (gastroesophageal reflux disease)   . History of BPH   . Hyperlipidemia   . Hypertension   . Hypothyroidism   . Lumbar post-laminectomy syndrome   . Myocardial infarction ([place])   . Pulmonary  embolism ([place]) 2009   hx of  . RBBB (right bundle branch block)   . Renal artery stenosis ([place])   . Thrombocytopenia ([place])   . Thyroid disease    HYPOTHYROIDISM  . Unstable angina ([facility])    NONE IN LONG TIME    Family History  Problem Relation Age of Onset  . Lung cancer Father 14       deceased   . Cancer Father 34       LUNG  . Stroke Mother 71       DECEASED   . Hyperlipidemia Mother   . Heart attack Brother 22       deceased  . Diabetes Brother   . Liver cancer Sister 24       deceased   . Cancer Sister     SOCIAL HISTORY: Social History   Tobacco Use  . Smoking status: Former Smoker    Types: Cigarettes    Last attempt to quit: 04/13/1974    Years since quitting: 43.4  . Smokeless tobacco: Former Systems developer    Types: Chew    Quit date: 01/26/2002  . Tobacco comment: He has smoked about 27-pack-year hx   Substance Use Topics  . Alcohol use: Yes    Alcohol/week: 1.2 oz    Types: 2 Cans of beer per week    Comment: He used to drink more heavily , but rarely  drinks at the current time    No Known Allergies  Current Outpatient Medications  Medication Sig Dispense Refill  . acetaminophen (TYLENOL) 500 MG tablet Take 500-1,000 mg by mouth daily as needed for mild pain or headache.     Marland Kitchen amLODipine (NORVASC) 5 MG tablet TAKE 1 TABLET BY MOUTH ONCE A DAY (Patient taking differently: Take 5 mg by mouth at bedtime) 90 tablet 1  . aspirin 81 MG tablet Take 162 mg by mouth daily.     . cephALEXin (KEFLEX) 500 MG capsule Take 1 capsule (500 mg total) by mouth 3 (three) times daily. 12 capsule 0  . cetirizine (ZYRTEC) 10 MG tablet Take 10 mg by mouth daily.    Marland Kitchen docusate sodium (COLACE) 100 MG capsule Take 200 mg by mouth daily as needed for mild constipation.    . furosemide (LASIX) 40 MG tablet Take 1 tablet (40 mg total) by mouth daily. 90 tablet 1  . guaiFENesin (MUCINEX) 600 MG 12 hr tablet Take 600 mg by mouth 2 (two) times daily as needed for cough or to loosen phlegm.     Marland Kitchen HYDROcodone-acetaminophen (NORCO/VICODIN) 5-325 MG tablet Take 1 tablet by mouth every 4 (four) hours as needed for moderate pain. 30 tablet 0  . levothyroxine (SYNTHROID, LEVOTHROID) 125 MCG tablet Take 1 tablet by mouth every morning on empty stomach with a full glass of water. (Patient taking differently: Take 125 mcg by mouth daily before breakfast. ) 90 tablet 3  . lubiprostone (AMITIZA) 24 MCG capsule Take 24 mcg by mouth 2 (two) times daily with a meal.    . metoprolol succinate (TOPROL-XL) 25 MG 24 hr tablet TAKE 1 TABLET BY MOUTH ONCE A DAY (Patient taking differently: Take 25 mg by mouth daily after supper) 90 tablet 1  . mometasone (NASONEX) 50 MCG/ACT nasal spray Place 2 sprays into the nose daily as needed (for allergies).    . Multiple Vitamins-Minerals (MULTIVITAMIN PO) Take 1 tablet by mouth daily.    . ranitidine (ZANTAC) 150 MG tablet Take 150 mg by mouth 2 (two) times daily as needed for heartburn.     . rosuvastatin (CRESTOR) 10 MG tablet TAKE 1 TABLET BY MOUTH EVERY NIGHT AT. BEDTIME. (Patient taking differently: Take 10 mg by mouth daily after supper. ) 90 tablet 3  . tamsulosin (FLOMAX) 0.4 MG CAPS capsule TAKE 1 CAPSULE BY MOUTH ONCE DAILY 90 capsule 0  . traMADol (ULTRAM) 50 MG tablet Take 50 mg by mouth every 8 (eight) hours as needed for moderate pain.    Marland Kitchen XARELTO 20 MG TABS tablet Take 1 tablet (20 mg total) by mouth daily. 30 tablet 5   No current facility-administered medications for this visit.     REVIEW OF SYSTEMS:  [X]  denotes positive finding, [ ]  denotes negative finding Cardiac  Comments:  Chest pain or chest pressure:    Shortness of breath upon exertion:    Short of breath when lying flat:    Irregular heart rhythm:        Vascular    Pain in calf, thigh, or hip brought on by ambulation: x   Pain in feet at night that wakes you up from your sleep:     Blood clot in your veins:    Leg swelling:         Pulmonary    Oxygen at home:      Productive cough:     Wheezing:  Neurologic    Sudden weakness in arms or legs:     Sudden numbness in arms or legs:     Sudden onset of difficulty speaking or slurred speech:    Temporary loss of vision in one eye:     Problems with dizziness:         Gastrointestinal    Blood in stool:     Vomited blood:         Genitourinary    Burning when urinating:     Blood in urine:        Psychiatric    Major depression:         Hematologic    Bleeding problems:    Problems with blood clotting too easily:        Skin    Rashes or ulcers:        Constitutional    Fever or chills:     PHYSICAL EXAM:   Vitals:   09/22/17 1521 09/22/17 1522  BP: (!) 148/68 (!) 148/68  Pulse: 61   Resp: 20   SpO2: 99%   Weight: 239 lb (108.4 kg)   Height: 6\' 2"  (1.88 m)     GENERAL: The patient is a well-nourished male, in no acute distress. The vital signs are documented above. CARDIAC: There is a regular rate and rhythm.  VASCULAR: I do not detect carotid bruits. He has palpable femoral pulses although these are slightly diminshed .  I cannot palpate popliteal or pedal pulses. He has moderate swelling of the left foot which is chronic. PULMONARY: There is good air exchange bilaterally without wheezing or rales. ABDOMEN: Soft and non-tender with normal pitched bowel sounds.  I cannot palpate an abdominal aortic aneurysm. MUSCULOSKELETAL: There are no major deformities or cyanosis. NEUROLOGIC: No focal weakness or paresthesias are detected. SKIN: There are no ulcers or rashes noted. PSYCHIATRIC: The patient has a normal affect.  DATA:    ARTERIAL DOPPLER STUDY: I have independently interpreted his arterial Doppler study today.  On the right side he has a monophasic dorsalis pedis and posterior tibial signal.  Arteries on the right are not compressible so ABI could not be obtained.  However toe pressure is 67 mmHg.  On the left side he has a biphasic dorsalis pedis and  posterior tibial signal.  Again the arteries are calcified and an ABI could not be obtained.  Toe pressure on the left is 75 mmHg.  CAROTID DUPLEX: I have independently interpreted his carotid duplex scan today.  He has no evidence of recurrent carotid stenosis on either side.  MEDICAL ISSUES:   STATUS POST BILATERAL CAROTID ENDARTERECTOMIES: Both carotid endarterectomy sites are widely patent.  He is on aspirin and is on a statin.  Have ordered a yearly follow-up carotid duplex scan in 1 year and I will see him back at that time.  He knows to call sooner if he has problems.  PERIPHERAL VASCULAR DISEASE: This patient does have evidence of some aortoiliac occlusive disease and infrainguinal arterial occlusive disease, however he is asymptomatic.  Given his age I certainly would not recommend an aggressive approach and less he developed rest pain or a nonhealing ulcer.  I have ordered follow-up ABIs in 1 year and I will see him back at that time.  He is to call sooner if he has problems.  Deitra Mayo [facility] [phone]

## 2017-09-27 DIAGNOSIS — Z08 Encounter for follow-up examination after completed treatment for malignant neoplasm: Secondary | ICD-10-CM | POA: Diagnosis not present

## 2017-09-27 DIAGNOSIS — Z85828 Personal history of other malignant neoplasm of skin: Secondary | ICD-10-CM | POA: Diagnosis not present

## 2017-09-27 DIAGNOSIS — L570 Actinic keratosis: Secondary | ICD-10-CM | POA: Diagnosis not present

## 2017-09-27 DIAGNOSIS — L821 Other seborrheic keratosis: Secondary | ICD-10-CM | POA: Diagnosis not present

## 2017-11-01 ENCOUNTER — Ambulatory Visit: Payer: Medicare Other | Admitting: Cardiovascular Disease

## 2017-11-01 ENCOUNTER — Encounter: Payer: Self-pay | Admitting: Cardiovascular Disease

## 2017-11-01 VITALS — BP 122/66 | HR 74 | Ht 74.0 in | Wt 235.0 lb

## 2017-11-01 DIAGNOSIS — E782 Mixed hyperlipidemia: Secondary | ICD-10-CM

## 2017-11-01 DIAGNOSIS — I10 Essential (primary) hypertension: Secondary | ICD-10-CM

## 2017-11-01 DIAGNOSIS — I2510 Atherosclerotic heart disease of native coronary artery without angina pectoris: Secondary | ICD-10-CM

## 2017-11-01 NOTE — Patient Instructions (Signed)

## 2017-11-01 NOTE — Progress Notes (Signed)
Cardiology Office Note Date:  11/02/2017   ID:  Leonard [name], DOB 11-26-1929, MRN [id number]  PCP:  [name], NP  Cardiologist:  [name], MD    Chief Complaint  Patient presents with  . Follow-up    CAD     History of Present Illness: Leonard PERRELL Sr. is a 84 y.o. male who presents for follow-up of coronary artery disease status post multivessel CABG in 2011, peripheral arterial disease with renal artery stenting in 2012, and carotid disease with history of bilateral carotid endarterectomies in 2013.  The patient is been maintained on long-term oral anticoagulation because of recurrent pulmonary emboli.   He is here alone today. He's really limited by back problems. Underwent spinal stimulator but hasn't appreciated much pain relief. He is primarily limited by back pain. He denies chest pain, shortness of breath, or leg swelling. He's unable to take pain medication because of constipation.    Past Medical History:  Diagnosis Date  . Anemia   . Anxiety   . Arrhythmia    PAROXYSMAL ATRIAL FIBRILLATION  . Arthritis    OSTEOARTHRITIS  . Cataracts, bilateral   . Chronic back pain   . Coronary artery disease 8/11   CABG...LM EMERGENT WITH IABP sees Dr. Legrand Como Jenifer Struve  . DJD (degenerative joint disease)   . DVT (deep venous thrombosis) ([place])   . Dyspnea   . GERD (gastroesophageal reflux disease)   . History of BPH   . Hyperlipidemia   . Hypertension   . Hypothyroidism   . Lumbar post-laminectomy syndrome   . Myocardial infarction ([place])   . Pulmonary embolism ([place]) 2009   hx of  . RBBB (right bundle branch block)   . Renal artery stenosis ([place])   . Thrombocytopenia ([place])   . Thyroid disease    HYPOTHYROIDISM  . Unstable angina ([place])    NONE IN LONG TIME    Past Surgical History:  Procedure Laterality Date  . Blood clot  Feb.20,  2016   Pulmonary Embolism  . CAROTID ENDARTERECTOMY Right Oct. 17, 2013   CE  . CAROTID ENDARTERECTOMY  Left Dec. 3, 2016   CE  . CATARACT EXTRACTION  05/2014,06/2014  . CORONARY ARTERY BYPASS GRAFT  11-21-09   EMERGENT X 3 GRAFTING. ..[name], MD, cc: [name]  . ENDARTERECTOMY  01/28/2012   Procedure: ENDARTERECTOMY CAROTID;  Surgeon: [name], MD;  Location: [facility];  Service: Vascular;  Laterality: Right;  with Primary Closure of Artery  . ENDARTERECTOMY  03/15/2012   Procedure: ENDARTERECTOMY CAROTID;  Surgeon: [name], MD;  Location: [facility];  Service: Vascular;  Laterality: Left;  . FOOT SURGERY    . MANDIBLE SURGERY    . MAXIMUM ACCESS (MAS)POSTERIOR LUMBAR INTERBODY FUSION (PLIF) 2 LEVEL N/A 08/08/2015   Procedure: Lumbar Four-Five, Lumbar Five-Sacral One Maximum access posterior lumbar interbody fusion;  Surgeon: [name], MD;  Location: [facility];  Service: Neurosurgery;  Laterality: N/A;  LUMBAR FOUR-FIVE ,LUMBAR FIVE -SACRAL Maximum access posterior lumbar interbody fusion  . RENAL ARTERY STENT  2010  . REPLACEMENT TOTAL KNEE  2006   RIGHT  . SPINAL CORD STIMULATOR INSERTION N/A 06/18/2017   Procedure: LUMBAR SPINAL CORD STIMULATOR INSERTION;  Surgeon: [name], MD;  Location: [facility];  Service: Neurosurgery;  Laterality: N/A;  LUMBAR SPINAL CORD STIMULATOR INSERTION  . TOTAL HIP ARTHROPLASTY     right    Current Outpatient Medications  Medication Sig Dispense Refill  .  amLODipine (NORVASC) 5 MG tablet TAKE 1 TABLET BY MOUTH ONCE A DAY (Patient taking differently: Take 5 mg by mouth at bedtime) 90 tablet 1  . aspirin 81 MG tablet Take 162 mg by mouth daily.     . cetirizine (ZYRTEC) 10 MG tablet Take 10 mg by mouth daily.    Marland Kitchen docusate sodium (COLACE) 100 MG capsule Take 200 mg by mouth daily as needed for mild constipation.    . furosemide (LASIX) 40 MG tablet Take 1 tablet (40 mg total) by mouth daily. 90 tablet 1  . guaiFENesin (MUCINEX) 600 MG 12 hr tablet Take 600 mg by mouth 2 (two) times daily as needed for cough or to loosen  phlegm.    Marland Kitchen levothyroxine (SYNTHROID, LEVOTHROID) 125 MCG tablet Take 1 tablet by mouth every morning on empty stomach with a full glass of water. (Patient taking differently: Take 125 mcg by mouth daily before breakfast. ) 90 tablet 3  . lubiprostone (AMITIZA) 24 MCG capsule Take 24 mcg by mouth 2 (two) times daily with a meal.    . metoprolol succinate (TOPROL-XL) 25 MG 24 hr tablet TAKE 1 TABLET BY MOUTH ONCE A DAY (Patient taking differently: Take 25 mg by mouth daily after supper) 90 tablet 1  . mometasone (NASONEX) 50 MCG/ACT nasal spray Place 2 sprays into the nose daily as needed (for allergies).    . Multiple Vitamins-Minerals (MULTIVITAMIN PO) Take 1 tablet by mouth daily.    . ranitidine (ZANTAC) 150 MG tablet Take 150 mg by mouth 2 (two) times daily as needed for heartburn.     . rosuvastatin (CRESTOR) 10 MG tablet TAKE 1 TABLET BY MOUTH EVERY NIGHT AT. BEDTIME. (Patient taking differently: Take 10 mg by mouth daily after supper. ) 90 tablet 3  . tamsulosin (FLOMAX) 0.4 MG CAPS capsule TAKE 1 CAPSULE BY MOUTH ONCE DAILY 90 capsule 0  . XARELTO 20 MG TABS tablet Take 1 tablet (20 mg total) by mouth daily. 30 tablet 5   No current facility-administered medications for this visit.     Allergies:   Patient has no known allergies.   Social History:  The patient  reports that he quit smoking about 43 years ago. His smoking use included cigarettes. He quit smokeless tobacco use about 15 years ago. His smokeless tobacco use included chew. He reports that he drinks about 1.2 oz of alcohol per week. He reports that he does not use drugs.   Family History:  The patient's family history includes Cancer in his sister; Cancer (age of onset: 75) in his father; Diabetes in his brother; Heart attack (age of onset: 40) in his brother; Hyperlipidemia in his mother; Liver cancer (age of onset: 62) in his sister; Lung cancer (age of onset: 62) in his father; Stroke (age of onset: 43) in his mother.    ROS:  Please see the history of present illness.  Otherwise, review of systems is positive for back pain, constipation, balance problems.  All other systems are reviewed and negative.   PHYSICAL EXAM: VS:  BP 122/66   Pulse 74   Ht 6\' 2"  (1.88 m)   Wt 235 lb (106.6 kg)   SpO2 96%   BMI 30.17 kg/m  , BMI Body mass index is 30.17 kg/m. GEN: pleasant elderly male, in no acute distress  HEENT: normal  Neck: no JVD, no masses. No carotid bruits Cardiac: RRR without murmur or gallop      Respiratory:  clear to auscultation bilaterally,  normal work of breathing GI: soft, nontender, nondistended, + BS MS: no deformity or atrophy  Ext: no pretibial edema, pedal pulses 2+= bilaterally Skin: warm and dry, no rash Neuro:  Strength and sensation are intact Psych: euthymic mood, full affect  EKG:  EKG is not ordered today.  Recent Labs: 03/17/2017: ALT 13; TSH 1.00 06/18/2017: BUN 15; Creatinine, Ser 1.53; Hemoglobin 15.5; Platelets 174; Potassium 4.0; Sodium 141   Lipid Panel     Component Value Date/Time   CHOL 160 03/17/2017 1201   TRIG 186.0 (H) 03/17/2017 1201   HDL 42.40 03/17/2017 1201   CHOLHDL 4 03/17/2017 1201   VLDL 37.2 03/17/2017 1201   LDLCALC 80 03/17/2017 1201   LDLDIRECT 100.8 08/12/2010 1523      Wt Readings from Last 3 Encounters:  11/01/17 235 lb (106.6 kg)  09/22/17 239 lb (108.4 kg)  06/18/17 240 lb (108.9 kg)     Cardiac Studies Reviewed: Echo 08-28-2016: Study Conclusions  - Left ventricle: The cavity size was mildly dilated. There was   mild concentric hypertrophy. Systolic function was normal. The   estimated ejection fraction was in the range of 55% to 60%. Wall   motion was normal; there were no regional wall motion   abnormalities. Features are consistent with a pseudonormal left   ventricular filling pattern, with concomitant abnormal relaxation   and increased filling pressure (grade 2 diastolic dysfunction). - Aortic valve: Trileaflet;  mildly thickened, mildly calcified   leaflets. - Mitral valve: Minimal focal calcification of the anterior leaflet   (medial segment(s)). - Tricuspid valve: There was trivial regurgitation.  ASSESSMENT AND PLAN: 1.  CAD, native vessel, without angina: pt doing well on medical therapy with no CV symptoms. He is limited by back issues. Will continue his current Rx which is reviewed today. While he is on oral anticoagulation with rivaroxaban for treatment of PE, I would favor keeping him on ASA 81 mg daily. He has no hx of bleeding problems and has extensive CAD/PAD as outlined above.   2.  Bilateral carotid artery stenosis.  Patient follows with vascular surgery.  Secondary risk reduction measures as outlined with aspirin, oral anticoagulation, and a statin drug.  3.  Essential hypertension: Blood pressure is well controlled on a combination of amlodipine, metoprolol succinate.  4.  Mixed hyperlipidemia: Treated with rosuvastatin.  Current medicines are reviewed with the patient today.  The patient does not have concerns regarding medicines.  Labs/ tests ordered today include:  No orders of the defined types were placed in this encounter.   Disposition:   FU one year  Signed, [name], MD  11/02/2017 9:32 PM    [facility] [place], [place], [place]  [address] Phone: [phone]; Fax: [phone]

## 2017-12-03 ENCOUNTER — Ambulatory Visit (INDEPENDENT_AMBULATORY_CARE_PROVIDER_SITE_OTHER): Payer: Self-pay

## 2017-12-03 ENCOUNTER — Encounter (INDEPENDENT_AMBULATORY_CARE_PROVIDER_SITE_OTHER): Payer: Self-pay | Admitting: Family Medicine

## 2017-12-03 ENCOUNTER — Ambulatory Visit (INDEPENDENT_AMBULATORY_CARE_PROVIDER_SITE_OTHER): Payer: Medicare Other | Admitting: Family Medicine

## 2017-12-03 DIAGNOSIS — M79671 Pain in right foot: Secondary | ICD-10-CM

## 2017-12-03 DIAGNOSIS — M25572 Pain in left ankle and joints of left foot: Secondary | ICD-10-CM

## 2017-12-03 DIAGNOSIS — M4316 Spondylolisthesis, lumbar region: Secondary | ICD-10-CM

## 2017-12-03 MED ORDER — MUPIROCIN 2 % EX OINT: 1.0000 "application " | TOPICAL_OINTMENT | Freq: Two times a day (BID) | CUTANEOUS | 1 refills | Status: DC

## 2017-12-03 MED ORDER — DOXYCYCLINE HYCLATE 100 MG PO TABS: 100.0000 mg | ORAL_TABLET | Freq: Two times a day (BID) | ORAL | 1 refills | Status: DC

## 2017-12-03 NOTE — Progress Notes (Signed)
Office Visit Note   Patient: Leonard RAMA Sr.           Date of Birth: 20-May-1929           MRN: [id number] Visit Date: 12/03/2017 Requested by: [name], NP [place], [place] [address] PCP: [name], NP  Subjective: Chief Complaint  Patient presents with  . Left Ankle - Open Wound, Pain    Blister formed on left medial ankle - first noticed last night. H/o ankle surgery (at least 10 years ago), has hardware in it.    HPI: He is here with a couple concerns.  He has had some soreness on the left medial ankle for a week or 2, then last night he noticed a blister.  The blister broke and some clear yellow fluid drained from it.  He has not had fever or chills.  He also struggles with chronic low back pain.  He had lumbar fusion surgery which did not help.  He had a spinal cord stimulator placed about 6 months ago which has not helped.  During the trial stimulator, he had excellent relief so he was discouraged when the permanent one did not help.  He has bilateral low back pain and after walking for a couple minutes, his legs hurt and feel weak to the point he has to sit down.  He has not been to physical therapy to his knowledge.                ROS: No bowel or bladder dysfunction.  Other systems were negative.  Objective: Vital Signs: There were no vitals taken for this visit.  Physical Exam:  Back: Old surgical scars are well-healed.  Tightness and some tenderness in both right and left paraspinous muscles. Left ankle: Shallow sore on the medial ankle measuring about 1 cm with no active drainage.  There is surrounding erythema and warmth.  1+ edema around the ankle.  Imaging: 3 view left ankle x-rays show intact surgical screws x5 with no obvious osteomyelitis or loosening of the screws.  There is vascular calcification.   Assessment & Plan: 1.  Left medial ankle cellulitis -Doxycycline by mouth and Bactroban topically.  Follow-up next  week for reevaluation, come in sooner for any problems.  2.  Chronic low back pain -Physical therapy ordered.   Follow-Up Instructions: No follow-ups on file.     Procedures: None today.   PMFS History: Patient Active Problem List   Diagnosis Date Noted  . Medicare annual wellness visit, subsequent 11/19/2015  . Spondylolisthesis of lumbar region 08/08/2015  . Renal failure 05/23/2015  . Pulmonary embolism ([place]) 06/03/2014  . Occlusion and stenosis of carotid artery without mention of cerebral infarction 01/27/2012  . Renal artery atherosclerosis ([place]) 07/16/2010  . CAROTID BRUIT 02/11/2010  . Mixed hyperlipidemia 12/17/2009  . Hypothyroidism 12/12/2009  . ANEMIA 12/12/2009  . THROMBOCYTOPENIA 12/12/2009  . Essential hypertension 12/12/2009  . UNSTABLE ANGINA 12/12/2009  . Coronary atherosclerosis of native coronary artery 12/12/2009  . RIGHT BUNDLE BRANCH BLOCK 12/12/2009  . PAROXYSMAL ATRIAL FIBRILLATION 12/12/2009  . Osteoarthritis 12/12/2009  . BPH (benign prostatic hyperplasia) 12/12/2009   Past Medical History:  Diagnosis Date  . Anemia   . Anxiety   . Arrhythmia    PAROXYSMAL ATRIAL FIBRILLATION  . Arthritis    OSTEOARTHRITIS  . Cataracts, bilateral   . Chronic back pain   . Coronary artery disease 8/11   CABG...LM EMERGENT WITH IABP sees  Dr. Legrand Como cooper  . DJD (degenerative joint disease)   . DVT (deep venous thrombosis) ([place])   . Dyspnea   . GERD (gastroesophageal reflux disease)   . History of BPH   . Hyperlipidemia   . Hypertension   . Hypothyroidism   . Lumbar post-laminectomy syndrome   . Myocardial infarction ([place])   . Pulmonary embolism ([place]) 2009   hx of  . RBBB (right bundle branch block)   . Renal artery stenosis ([place])   . Thrombocytopenia ([place])   . Thyroid disease    HYPOTHYROIDISM  . Unstable angina ([facility])    NONE IN LONG TIME    Family History  Problem Relation Age of Onset  . Lung cancer Father 66       deceased   . Cancer  Father 26       LUNG  . Stroke Mother 80       DECEASED   . Hyperlipidemia Mother   . Heart attack Brother 17       deceased  . Diabetes Brother   . Liver cancer Sister 45       deceased   . Cancer Sister     Past Surgical History:  Procedure Laterality Date  . Blood clot  Feb.20,  2016   Pulmonary Embolism  . CAROTID ENDARTERECTOMY Right Oct. 17, 2013   CE  . CAROTID ENDARTERECTOMY Left Dec. 3, 2016   CE  . CATARACT EXTRACTION  05/2014,06/2014  . CORONARY ARTERY BYPASS GRAFT  11-21-09   EMERGENT X 3 GRAFTING. ..[name], MD, cc: [name]  . ENDARTERECTOMY  01/28/2012   Procedure: ENDARTERECTOMY CAROTID;  Surgeon: [name], MD;  Location: [facility];  Service: Vascular;  Laterality: Right;  with Primary Closure of Artery  . ENDARTERECTOMY  03/15/2012   Procedure: ENDARTERECTOMY CAROTID;  Surgeon: [name], MD;  Location: [facility];  Service: Vascular;  Laterality: Left;  . FOOT SURGERY    . MANDIBLE SURGERY    . MAXIMUM ACCESS (MAS)POSTERIOR LUMBAR INTERBODY FUSION (PLIF) 2 LEVEL N/A 08/08/2015   Procedure: Lumbar Four-Five, Lumbar Five-Sacral One Maximum access posterior lumbar interbody fusion;  Surgeon: [name], MD;  Location: [facility];  Service: Neurosurgery;  Laterality: N/A;  LUMBAR FOUR-FIVE ,LUMBAR FIVE -SACRAL Maximum access posterior lumbar interbody fusion  . RENAL ARTERY STENT  2010  . REPLACEMENT TOTAL KNEE  2006   RIGHT  . SPINAL CORD STIMULATOR INSERTION N/A 06/18/2017   Procedure: LUMBAR SPINAL CORD STIMULATOR INSERTION;  Surgeon: [name], MD;  Location: [facility];  Service: Neurosurgery;  Laterality: N/A;  LUMBAR SPINAL CORD STIMULATOR INSERTION  . TOTAL HIP ARTHROPLASTY     right   Social History   Occupational History  . 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 Smoking status: Former Smoker    Types: Cigarettes    Last attempt to quit:  04/13/1974    Years since quitting: 43.6  . Smokeless tobacco: Former Systems developer    Types: Chew    Quit date: 01/26/2002  . Tobacco comment: He has smoked about 27-pack-year hx   Substance and Sexual Activity  . Alcohol use: Yes    Alcohol/week: 2.0 standard drinks    Types: 2 Cans of beer per week    Comment: He used to drink more heavily , but rarely drinks at the current time  . Drug use: No  .  Sexual activity: Not on file

## 2017-12-10 ENCOUNTER — Ambulatory Visit (INDEPENDENT_AMBULATORY_CARE_PROVIDER_SITE_OTHER): Payer: Medicare Other | Admitting: Family Medicine

## 2017-12-10 ENCOUNTER — Encounter (INDEPENDENT_AMBULATORY_CARE_PROVIDER_SITE_OTHER): Payer: Self-pay | Admitting: Family Medicine

## 2017-12-10 DIAGNOSIS — D631 Anemia in chronic kidney disease: Secondary | ICD-10-CM | POA: Diagnosis not present

## 2017-12-10 DIAGNOSIS — M79672 Pain in left foot: Secondary | ICD-10-CM

## 2017-12-10 DIAGNOSIS — N189 Chronic kidney disease, unspecified: Secondary | ICD-10-CM | POA: Diagnosis not present

## 2017-12-10 DIAGNOSIS — N2581 Secondary hyperparathyroidism of renal origin: Secondary | ICD-10-CM | POA: Diagnosis not present

## 2017-12-10 DIAGNOSIS — N183 Chronic kidney disease, stage 3 (moderate): Secondary | ICD-10-CM | POA: Diagnosis not present

## 2017-12-10 DIAGNOSIS — I129 Hypertensive chronic kidney disease with stage 1 through stage 4 chronic kidney disease, or unspecified chronic kidney disease: Secondary | ICD-10-CM | POA: Diagnosis not present

## 2017-12-10 MED ORDER — DOXYCYCLINE HYCLATE 100 MG PO TABS: 100.0000 mg | ORAL_TABLET | Freq: Two times a day (BID) | ORAL | 3 refills | Status: DC

## 2017-12-10 NOTE — Progress Notes (Signed)
Office Visit Note   Patient: Leonard DUPLER Sr.           Date of Birth: 04-20-1929           MRN: [id number] Visit Date: 12/10/2017 Requested by: [name], NP [place], [place] [address] PCP: [name], NP  Subjective: Chief Complaint  Patient presents with  . Left Ankle - Wound Check    HPI: He is here for follow-up left medial ankle ulceration.  He is almost finished with doxycycline and still using Bactroban topically.  His ulcer seems to have healed, but now he has another one proximal and posterior to it.  No fever or chills, minimal pain.  He is working with his nephrologist on diuresis.              ROS: Noncontributory  Objective: Vital Signs: There were no vitals taken for this visit.  Physical Exam:  Left leg: 1+ pitting edema with a shallow ulcer measuring about 1 cm on the medial ankle proximal to the malleolus.  No surrounding erythema or warmth.  No drainage.  His previous ulcer is now completely healed.  Imaging: None today.  Assessment & Plan: 1.  Healed left medial ankle ulcer, but now with a new shallow ulcer. -Elevate, diuretics as needed.  Continue doxycycline and Bactroban.  Follow-up if wound persist.   Follow-Up Instructions: No follow-ups on file.     Procedures: None today.   PMFS History: Patient Active Problem List   Diagnosis Date Noted  . Medicare annual wellness visit, subsequent 11/19/2015  . Spondylolisthesis of lumbar region 08/08/2015  . Renal failure 05/23/2015  . Pulmonary embolism ([place]) 06/03/2014  . Occlusion and stenosis of carotid artery without mention of cerebral infarction 01/27/2012  . Renal artery atherosclerosis ([place]) 07/16/2010  . CAROTID BRUIT 02/11/2010  . Mixed hyperlipidemia 12/17/2009  . Hypothyroidism 12/12/2009  . ANEMIA 12/12/2009  . THROMBOCYTOPENIA 12/12/2009  . Essential hypertension 12/12/2009  . UNSTABLE ANGINA 12/12/2009  . Coronary atherosclerosis of native  coronary artery 12/12/2009  . RIGHT BUNDLE BRANCH BLOCK 12/12/2009  . PAROXYSMAL ATRIAL FIBRILLATION 12/12/2009  . Osteoarthritis 12/12/2009  . BPH (benign prostatic hyperplasia) 12/12/2009   Past Medical History:  Diagnosis Date  . Anemia   . Anxiety   . Arrhythmia    PAROXYSMAL ATRIAL FIBRILLATION  . Arthritis    OSTEOARTHRITIS  . Cataracts, bilateral   . Chronic back pain   . Coronary artery disease 8/11   CABG...LM EMERGENT WITH IABP sees Dr. Legrand Como cooper  . DJD (degenerative joint disease)   . DVT (deep venous thrombosis) ([place])   . Dyspnea   . GERD (gastroesophageal reflux disease)   . History of BPH   . Hyperlipidemia   . Hypertension   . Hypothyroidism   . Lumbar post-laminectomy syndrome   . Myocardial infarction ([place])   . Pulmonary embolism ([place]) 2009   hx of  . RBBB (right bundle branch block)   . Renal artery stenosis ([place])   . Thrombocytopenia ([place])   . Thyroid disease    HYPOTHYROIDISM  . Unstable angina ([facility])    NONE IN LONG TIME    Family History  Problem Relation Age of Onset  . Lung cancer Father 49       deceased   . Cancer Father 84       LUNG  . Stroke Mother 64       DECEASED   . Hyperlipidemia Mother   .  Heart attack Brother 92       deceased  . Diabetes Brother   . Liver cancer Sister 34       deceased   . Cancer Sister     Past Surgical History:  Procedure Laterality Date  . Blood clot  Feb.20,  2016   Pulmonary Embolism  . CAROTID ENDARTERECTOMY Right Oct. 17, 2013   CE  . CAROTID ENDARTERECTOMY Left Dec. 3, 2016   CE  . CATARACT EXTRACTION  05/2014,06/2014  . CORONARY ARTERY BYPASS GRAFT  11-21-09   EMERGENT X 3 GRAFTING. ..[name], MD, cc: [name]  . ENDARTERECTOMY  01/28/2012   Procedure: ENDARTERECTOMY CAROTID;  Surgeon: [name], MD;  Location: [facility];  Service: Vascular;  Laterality: Right;  with Primary Closure of Artery  . ENDARTERECTOMY  03/15/2012   Procedure: ENDARTERECTOMY CAROTID;   Surgeon: [name], MD;  Location: [facility];  Service: Vascular;  Laterality: Left;  . FOOT SURGERY    . MANDIBLE SURGERY    . MAXIMUM ACCESS (MAS)POSTERIOR LUMBAR INTERBODY FUSION (PLIF) 2 LEVEL N/A 08/08/2015   Procedure: Lumbar Four-Five, Lumbar Five-Sacral One Maximum access posterior lumbar interbody fusion;  Surgeon: [name], MD;  Location: [facility];  Service: Neurosurgery;  Laterality: N/A;  LUMBAR FOUR-FIVE ,LUMBAR FIVE -SACRAL Maximum access posterior lumbar interbody fusion  . RENAL ARTERY STENT  2010  . REPLACEMENT TOTAL KNEE  2006   RIGHT  . SPINAL CORD STIMULATOR INSERTION N/A 06/18/2017   Procedure: LUMBAR SPINAL CORD STIMULATOR INSERTION;  Surgeon: [name], MD;  Location: [facility];  Service: Neurosurgery;  Laterality: N/A;  LUMBAR SPINAL CORD STIMULATOR INSERTION  . TOTAL HIP ARTHROPLASTY     right   Social History   Occupational History  . 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 Smoking status: Former Smoker    Types: Cigarettes    Last attempt to quit: 04/13/1974    Years since quitting: 43.6  . Smokeless tobacco: Former Systems developer    Types: Chew    Quit date: 01/26/2002  . Tobacco comment: He has smoked about 27-pack-year hx   Substance and Sexual Activity  . Alcohol use: Yes    Alcohol/week: 2.0 standard drinks    Types: 2 Cans of beer per week    Comment: He used to drink more heavily , but rarely drinks at the current time  . Drug use: No  . Sexual activity: Not on file

## 2017-12-21 ENCOUNTER — Other Ambulatory Visit: Payer: Self-pay | Admitting: Primary Care

## 2017-12-21 DIAGNOSIS — I10 Essential (primary) hypertension: Secondary | ICD-10-CM

## 2017-12-24 DIAGNOSIS — I129 Hypertensive chronic kidney disease with stage 1 through stage 4 chronic kidney disease, or unspecified chronic kidney disease: Secondary | ICD-10-CM | POA: Diagnosis not present

## 2017-12-31 ENCOUNTER — Telehealth: Payer: Self-pay | Admitting: Primary Care

## 2017-12-31 NOTE — Telephone Encounter (Signed)
Message left for patient to return my call.  

## 2017-12-31 NOTE — Telephone Encounter (Signed)
Correct, tdap. Approved.

## 2017-12-31 NOTE — Telephone Encounter (Signed)
Copied from [place] [id number]. Topic: Quick Communication - See Telephone Encounter >> Dec 31, 2017 10:05 AM Antonieta Iba C wrote:  CRM for notification. See Telephone encounter for: 12/31/17.  Pt's spouse called in to be advised. They are having a new grand baby and they would like to make sure that they have all the immunizations that they need to be around the baby.   Please assist.   CB: [phone]

## 2017-12-31 NOTE — Telephone Encounter (Signed)
Looks like patient need a Tdap if insurance would cover it, right Anda Kraft?

## 2017-12-31 NOTE — Telephone Encounter (Signed)
Copied from [place] [id number]. Topic: Quick Communication - See Telephone Encounter >> Dec 31, 2017 10:05 AM Antonieta Iba C wrote: CRM for notification. See Telephone encounter for: 12/31/17.  Pt's spouse called in to be advised. They are having a new grand baby and they would like to make sure that they have all the immunizations that they need to be around the baby.   Please assist.   CB: [phone]

## 2018-01-03 NOTE — Telephone Encounter (Signed)
Spoken to patient's spouse and inform her of Tawni Millers comments

## 2018-01-26 ENCOUNTER — Ambulatory Visit: Payer: Medicare Other

## 2018-02-28 ENCOUNTER — Other Ambulatory Visit: Payer: Self-pay | Admitting: Primary Care

## 2018-02-28 DIAGNOSIS — I10 Essential (primary) hypertension: Secondary | ICD-10-CM

## 2018-03-02 DIAGNOSIS — D631 Anemia in chronic kidney disease: Secondary | ICD-10-CM | POA: Diagnosis not present

## 2018-03-02 DIAGNOSIS — N2581 Secondary hyperparathyroidism of renal origin: Secondary | ICD-10-CM | POA: Diagnosis not present

## 2018-03-02 DIAGNOSIS — I129 Hypertensive chronic kidney disease with stage 1 through stage 4 chronic kidney disease, or unspecified chronic kidney disease: Secondary | ICD-10-CM | POA: Diagnosis not present

## 2018-03-02 DIAGNOSIS — N183 Chronic kidney disease, stage 3 (moderate): Secondary | ICD-10-CM | POA: Diagnosis not present

## 2018-03-28 ENCOUNTER — Other Ambulatory Visit: Payer: Self-pay | Admitting: Primary Care

## 2018-03-28 DIAGNOSIS — E038 Other specified hypothyroidism: Secondary | ICD-10-CM

## 2018-03-28 DIAGNOSIS — E785 Hyperlipidemia, unspecified: Secondary | ICD-10-CM

## 2018-03-29 NOTE — Telephone Encounter (Signed)
Last prescribed on 03/17/2017  Last office visit on 03/17/2017

## 2018-03-29 NOTE — Telephone Encounter (Signed)
Patient is overdue for follow-up office visit of medical problems.  Please schedule as soon as possible. Will send 30-day supply for his requested medications until seen.

## 2018-04-19 ENCOUNTER — Ambulatory Visit: Payer: Medicare Other | Admitting: Primary Care

## 2018-04-19 ENCOUNTER — Encounter: Payer: Self-pay | Admitting: Primary Care

## 2018-04-19 ENCOUNTER — Other Ambulatory Visit: Payer: Self-pay | Admitting: Primary Care

## 2018-04-19 VITALS — BP 126/82 | HR 86 | Temp 98.0°F | Ht 74.0 in | Wt 229.0 lb

## 2018-04-19 DIAGNOSIS — I2510 Atherosclerotic heart disease of native coronary artery without angina pectoris: Secondary | ICD-10-CM

## 2018-04-19 DIAGNOSIS — N183 Chronic kidney disease, stage 3 unspecified: Secondary | ICD-10-CM

## 2018-04-19 DIAGNOSIS — E782 Mixed hyperlipidemia: Secondary | ICD-10-CM | POA: Diagnosis not present

## 2018-04-19 DIAGNOSIS — I10 Essential (primary) hypertension: Secondary | ICD-10-CM

## 2018-04-19 DIAGNOSIS — E038 Other specified hypothyroidism: Secondary | ICD-10-CM

## 2018-04-19 DIAGNOSIS — I4891 Unspecified atrial fibrillation: Secondary | ICD-10-CM

## 2018-04-19 DIAGNOSIS — I701 Atherosclerosis of renal artery: Secondary | ICD-10-CM

## 2018-04-19 DIAGNOSIS — R739 Hyperglycemia, unspecified: Secondary | ICD-10-CM

## 2018-04-19 DIAGNOSIS — N400 Enlarged prostate without lower urinary tract symptoms: Secondary | ICD-10-CM

## 2018-04-19 DIAGNOSIS — K5900 Constipation, unspecified: Secondary | ICD-10-CM

## 2018-04-19 LAB — COMPREHENSIVE METABOLIC PANEL
ALBUMIN: 3.9 g/dL (ref 3.5–5.2)
ALT: 14 U/L (ref 0–53)
AST: 19 U/L (ref 0–37)
Alkaline Phosphatase: 80 U/L (ref 39–117)
BUN: 20 mg/dL (ref 6–23)
CALCIUM: 9.2 mg/dL (ref 8.4–10.5)
CHLORIDE: 103 meq/L (ref 96–112)
CO2: 28 mEq/L (ref 19–32)
CREATININE: 1.43 mg/dL (ref 0.40–1.50)
GFR: 49.83 mL/min — ABNORMAL LOW (ref >60.00)
Glucose, Bld: 152 mg/dL — ABNORMAL HIGH (ref 70–99)
Potassium: 3.6 mEq/L (ref 3.5–5.1)
Sodium: 140 mEq/L (ref 135–145)
Total Bilirubin: 0.7 mg/dL (ref 0.2–1.2)
Total Protein: 6.6 g/dL (ref 6.0–8.3)

## 2018-04-19 LAB — LIPID PANEL
CHOLESTEROL: 143 mg/dL (ref 0–200)
HDL: 37.5 mg/dL — ABNORMAL LOW (ref >39.00)
LDL Cholesterol: 78 mg/dL (ref 0–99)
NonHDL: 105.55
TRIGLYCERIDES: 139 mg/dL (ref 0.0–149.0)
Total CHOL/HDL Ratio: 4
VLDL: 27.8 mg/dL (ref 0.0–40.0)

## 2018-04-19 LAB — TSH: TSH: 6.05 u[IU]/mL — ABNORMAL HIGH (ref 0.35–4.50)

## 2018-04-19 NOTE — Assessment & Plan Note (Signed)
Following with cardiology and also nephrology.

## 2018-04-19 NOTE — Assessment & Plan Note (Signed)
Intermittent when taking narcotics. No recent use of Amitiza.

## 2018-04-19 NOTE — Patient Instructions (Signed)
Stop by the lab prior to leaving today. I will notify you of your results once received.   Make sure to take your thyroid medication (levothyroxine) every morning with water only. No food or other medications for 30 minutes.  Follow up with your heart and kidney doctors as discussed.  It was a pleasure to see you today!

## 2018-04-19 NOTE — Assessment & Plan Note (Signed)
Stable in the office today, continue current regimen. 

## 2018-04-19 NOTE — Assessment & Plan Note (Signed)
Following with nephrology

## 2018-04-19 NOTE — Assessment & Plan Note (Signed)
Compliant to levothyroxine but taking inappropriately.  Discussed to take with water only and to wait 30 minutes prior to other medications and food. He verbalized understanding. Repeat TSH pending.

## 2018-04-19 NOTE — Assessment & Plan Note (Signed)
Overall doing well on tamsulosin. Continue same.

## 2018-04-19 NOTE — Assessment & Plan Note (Signed)
Repeat lipids pending. Continue statin and aspirin.

## 2018-04-19 NOTE — Assessment & Plan Note (Signed)
Compliant to statin and aspirin. Repeat lipid panel pending. Asymptomatic.

## 2018-04-19 NOTE — Progress Notes (Signed)
Subjective:    Patient ID: Leonard Berg., male    DOB: March 27, 1930, 85 y.o.   MRN: [id number]  HPI  Leonard Berg is a 85 year old male who presents today for follow up. He's not been seen in over one year.  1) Hypothyroidism: Currently managed on levothyroxine 125 mcg. Last TSH was 1.00 in December 2018. He takes his levothyroxine every morning the rest of his other medication.   2) Essential Hypertension/Atrial Fibrillation: Currently managed on amlodipine 5 mg, metoprolol succinate 25 mg, furosemide 40 mg, Xarelto 20 mg.   3) CAD/Hyperlipdidemia/Carotid artery stenosis: Currently managed on rosuvastatin 10 mg and aspirin 81 mg. His last lipid panel was in December 2018 with LDL of 80. Currently following with cardiology with last visit in July 2019. He denies chest pain, shortness of breath.   4) BPH: Currently managed on tamsulosin. He denies difficulty urinating. Doing well on current regimen.  5) CKD Stage III: Currently following with nephrology. Has an appointment soon. He denies use of NSAID's.   BP Readings from Last 3 Encounters:  04/19/18 126/82  11/01/17 122/66  09/22/17 (!) 148/68     Review of Systems  Eyes: Negative for visual disturbance.  Respiratory: Negative for shortness of breath.   Cardiovascular: Negative for chest pain.  Gastrointestinal: Negative for constipation.  Allergic/Immunologic: Positive for environmental allergies.  Neurological: Negative for dizziness and headaches.       Past Medical History:  Diagnosis Date  . Anemia   . Anxiety   . Arrhythmia    PAROXYSMAL ATRIAL FIBRILLATION  . Arthritis    OSTEOARTHRITIS  . Cataracts, bilateral   . Chronic back pain   . Coronary artery disease 8/11   CABG...LM EMERGENT WITH IABP sees Dr. Legrand Como cooper  . DJD (degenerative joint disease)   . DVT (deep venous thrombosis) ([place])   . Dyspnea   . GERD (gastroesophageal reflux disease)   . History of BPH   . Hyperlipidemia   .  Hypertension   . Hypothyroidism   . Lumbar post-laminectomy syndrome   . Myocardial infarction ([place])   . Pulmonary embolism ([place]) 2009   hx of  . RBBB (right bundle branch block)   . Renal artery stenosis ([place])   . Thrombocytopenia ([place])   . Thyroid disease    HYPOTHYROIDISM  . Unstable angina ([facility])    NONE IN LONG TIME     Social History   Socioeconomic History  . Marital status: Married    Spouse name: Not on file  . Number of children: 2  . Years of education: Not on file  . Highest education level: Not on file  Occupational History  . 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 Financial resource strain: Not on file  . Food insecurity:    Worry: Not on file    Inability: Not on file  . Transportation needs:    Medical: Not on file    Non-medical: Not on file  Tobacco Use  . Smoking status: Former Smoker    Types: Cigarettes    Last attempt to quit: 04/13/1974    Years since quitting: 44.0  . Smokeless tobacco: Former Systems developer    Types: Chew    Quit date: 01/26/2002  . Tobacco comment: He has smoked about 27-pack-year hx   Substance and Sexual Activity  . Alcohol use: Yes    Alcohol/week: 2.0 standard  drinks    Types: 2 Cans of beer per week    Comment: He used to drink more heavily , but rarely drinks at the current time  . Drug use: No  . Sexual activity: Not on file  Lifestyle  . Physical activity:    Days per week: Not on file    Minutes per session: Not on file  . Stress: Not on file  Relationships  . Social connections:    Talks on phone: Not on file    Gets together: Not on file    Attends religious service: Not on file    Active member of club or organization: Not on file    Attends meetings of clubs or organizations: Not on file    Relationship status: Not on file  . Intimate partner violence:    Fear of current or ex partner: Not on file    Emotionally abused: Not on file     Physically abused: Not on file    Forced sexual activity: Not on file  Other Topics Concern  . Not on file  Social History Narrative   LIVES IN [place] WITH HIS WIFE.   HE HAS 2 GROWN CHILDREN   HE IS RETIRED PRISON FIRM SUPERINTENDENT.   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   HE GOES TO [employer] 3 X WEEKLY.    Past Surgical History:  Procedure Laterality Date  . Blood clot  Feb.20,  2016   Pulmonary Embolism  . CAROTID ENDARTERECTOMY Right Oct. 17, 2013   CE  . CAROTID ENDARTERECTOMY Left Dec. 3, 2016   CE  . CATARACT EXTRACTION  05/2014,06/2014  . CORONARY ARTERY BYPASS GRAFT  11-21-09   EMERGENT X 3 GRAFTING. ..[name], MD, cc: [name]  . ENDARTERECTOMY  01/28/2012   Procedure: ENDARTERECTOMY CAROTID;  Surgeon: [name], MD;  Location: [facility];  Service: Vascular;  Laterality: Right;  with Primary Closure of Artery  . ENDARTERECTOMY  03/15/2012   Procedure: ENDARTERECTOMY CAROTID;  Surgeon: [name], MD;  Location: [facility];  Service: Vascular;  Laterality: Left;  . FOOT SURGERY    . MANDIBLE SURGERY    . MAXIMUM ACCESS (MAS)POSTERIOR LUMBAR INTERBODY FUSION (PLIF) 2 LEVEL N/A 08/08/2015   Procedure: Lumbar Four-Five, Lumbar Five-Sacral One Maximum access posterior lumbar interbody fusion;  Surgeon: [name], MD;  Location: [facility];  Service: Neurosurgery;  Laterality: N/A;  LUMBAR FOUR-FIVE ,LUMBAR FIVE -SACRAL Maximum access posterior lumbar interbody fusion  . RENAL ARTERY STENT  2010  . REPLACEMENT TOTAL KNEE  2006   RIGHT  . SPINAL CORD STIMULATOR INSERTION N/A 06/18/2017   Procedure: LUMBAR SPINAL CORD STIMULATOR INSERTION;  Surgeon: [name], MD;  Location: [facility];  Service: Neurosurgery;  Laterality: N/A;  LUMBAR SPINAL CORD STIMULATOR INSERTION  . TOTAL HIP ARTHROPLASTY     right    Family History  Problem Relation Age of Onset  . Lung cancer Father 34       deceased     . Cancer Father 81       LUNG  . Stroke Mother 38       DECEASED   . Hyperlipidemia Mother   . Heart attack Brother 5       deceased  . Diabetes Brother   . Liver cancer Sister 52       deceased   . Cancer Sister     No  Known Allergies  Current Outpatient Medications on File Prior to Visit  Medication Sig Dispense Refill  . amLODipine (NORVASC) 5 MG tablet Take 1 tablet (5 mg total) by mouth daily. NEED APPOINTMENT FOR ANY MORE REFILLS 30 tablet 0  . aspirin 81 MG tablet Take 162 mg by mouth daily.     . cetirizine (ZYRTEC) 10 MG tablet Take 10 mg by mouth daily.    Marland Kitchen docusate sodium (COLACE) 100 MG capsule Take 200 mg by mouth daily as needed for mild constipation.    . furosemide (LASIX) 40 MG tablet Take 1 tablet (40 mg total) by mouth daily. 90 tablet 1  . guaiFENesin (MUCINEX) 600 MG 12 hr tablet Take 600 mg by mouth 2 (two) times daily as needed for cough or to loosen phlegm.    Marland Kitchen levothyroxine (SYNTHROID, LEVOTHROID) 125 MCG tablet TAKE 1 TAB BY MOUTH ONCE DAILY. TAKE ON AN EMPTY STOMACH WITH A GLASSOF WATER ATLEAST 30-60 MINUTES BEFORE BREAKFAST 30 tablet 0  . metoprolol succinate (TOPROL-XL) 25 MG 24 hr tablet Take 25 mg by mouth daily after supper 90 tablet 1  . mometasone (NASONEX) 50 MCG/ACT nasal spray Place 2 sprays into the nose daily as needed (for allergies).    . Multiple Vitamins-Minerals (MULTIVITAMIN PO) Take 1 tablet by mouth daily.    . mupirocin ointment (BACTROBAN) 2 % Apply 1 application topically 2 (two) times daily. 22 g 1  . rosuvastatin (CRESTOR) 10 MG tablet Take 1 tablet by mouth every evening for cholesterol. 30 tablet 0  . tamsulosin (FLOMAX) 0.4 MG CAPS capsule TAKE 1 TABLET BY MOUTH ONCE A DAY 90 capsule 0  . XARELTO 20 MG TABS tablet Take 1 tablet (20 mg total) by mouth daily. 30 tablet 5  . lubiprostone (AMITIZA) 24 MCG capsule Take 24 mcg by mouth 2 (two) times daily with a meal.    . ranitidine (ZANTAC) 150 MG tablet Take 150 mg by mouth 2  (two) times daily as needed for heartburn.      No current facility-administered medications on file prior to visit.     BP 126/82   Pulse 86   Temp 98 F (36.7 C) (Oral)   Ht 6\' 2"  (1.88 m)   Wt 229 lb (103.9 kg)   SpO2 98%   BMI 29.40 kg/m    Objective:   Physical Exam  Constitutional: He appears well-nourished.  Neck: Neck supple.  Cardiovascular: Normal rate and regular rhythm.  Respiratory: Effort normal and breath sounds normal.  Skin: Skin is warm and dry.  Psychiatric: He has a normal mood and affect.           Assessment & Plan:

## 2018-04-19 NOTE — Assessment & Plan Note (Signed)
Rate and rhythm regular today, continue Xarelto and metoprolol succinate.

## 2018-04-30 ENCOUNTER — Other Ambulatory Visit: Payer: Self-pay | Admitting: Primary Care

## 2018-04-30 DIAGNOSIS — E038 Other specified hypothyroidism: Secondary | ICD-10-CM

## 2018-04-30 DIAGNOSIS — E785 Hyperlipidemia, unspecified: Secondary | ICD-10-CM

## 2018-05-25 ENCOUNTER — Other Ambulatory Visit: Payer: Self-pay | Admitting: Primary Care

## 2018-05-25 DIAGNOSIS — I10 Essential (primary) hypertension: Secondary | ICD-10-CM

## 2018-05-27 DIAGNOSIS — I129 Hypertensive chronic kidney disease with stage 1 through stage 4 chronic kidney disease, or unspecified chronic kidney disease: Secondary | ICD-10-CM | POA: Diagnosis not present

## 2018-05-27 DIAGNOSIS — D631 Anemia in chronic kidney disease: Secondary | ICD-10-CM | POA: Diagnosis not present

## 2018-05-27 DIAGNOSIS — N2581 Secondary hyperparathyroidism of renal origin: Secondary | ICD-10-CM | POA: Diagnosis not present

## 2018-05-27 DIAGNOSIS — N183 Chronic kidney disease, stage 3 (moderate): Secondary | ICD-10-CM | POA: Diagnosis not present

## 2018-06-21 ENCOUNTER — Other Ambulatory Visit (INDEPENDENT_AMBULATORY_CARE_PROVIDER_SITE_OTHER): Payer: Medicare Other

## 2018-06-21 DIAGNOSIS — E038 Other specified hypothyroidism: Secondary | ICD-10-CM | POA: Diagnosis not present

## 2018-06-21 DIAGNOSIS — R739 Hyperglycemia, unspecified: Secondary | ICD-10-CM

## 2018-06-21 LAB — TSH: TSH: 0.15 u[IU]/mL — AB (ref 0.35–4.50)

## 2018-06-21 LAB — HEMOGLOBIN A1C: HEMOGLOBIN A1C: 6.5 % (ref 4.6–6.5)

## 2018-06-22 ENCOUNTER — Other Ambulatory Visit: Payer: Self-pay | Admitting: Primary Care

## 2018-06-22 DIAGNOSIS — E038 Other specified hypothyroidism: Secondary | ICD-10-CM

## 2018-06-22 MED ORDER — LEVOTHYROXINE SODIUM 112 MCG PO TABS: ORAL_TABLET | ORAL | 1 refills | Status: DC

## 2018-06-30 ENCOUNTER — Other Ambulatory Visit: Payer: Self-pay | Admitting: Cardiovascular Disease

## 2018-06-30 NOTE — Telephone Encounter (Signed)
Last OV 11/01/17 Scr 1.43 (04/19/2018) TBW crcl 54 ml/min xarelto 20mg  sent to pharmacy

## 2018-08-01 ENCOUNTER — Telehealth: Payer: Self-pay

## 2018-08-01 NOTE — Telephone Encounter (Signed)
Talked to patient's wife. Advised her that patient will not have to come into office and is able to stay in car and she said that will be okay. He will still be able to come for labs.

## 2018-08-01 NOTE — Telephone Encounter (Signed)
[facility] Night - Client Nonclinical Telephone Record [facility] Night - Client Client Site [facility] - Night Physician Alma Friendly - NP Contact Type Call Who Is Calling Patient / Member / Family / Caregiver Caller Name [place] Phone Number [phone] Patient Name Leonard Berg Patient DOB Feb 15, 1930 Call Type Message Only Information Provided Reason for Call Request to Reschedule Office Appointment Initial Comment Caller states husband got a message about an upcoming appt on Wednesday for labs, not able to make it, homebound, and needing to reschedule it. She is asking for a call back. Additional Comment Provided information for a call back from the office. Call Closed By: Rosana Fret Transaction Date/Time: 07/30/2018 8:35:06 AM (ET)

## 2018-08-03 ENCOUNTER — Other Ambulatory Visit: Payer: Self-pay

## 2018-08-03 ENCOUNTER — Other Ambulatory Visit (INDEPENDENT_AMBULATORY_CARE_PROVIDER_SITE_OTHER): Payer: Medicare Other

## 2018-08-03 DIAGNOSIS — E038 Other specified hypothyroidism: Secondary | ICD-10-CM

## 2018-08-04 LAB — TSH: TSH: 0.56 u[IU]/mL (ref 0.35–4.50)

## 2018-08-08 ENCOUNTER — Telehealth: Payer: Self-pay | Admitting: Primary Care

## 2018-08-08 NOTE — Telephone Encounter (Signed)
Spoken and notified patient of Kate Clark's comments. Patient verbalized understanding.  

## 2018-08-08 NOTE — Telephone Encounter (Signed)
Patient is calling to find out the results from his lab visit.

## 2018-08-19 ENCOUNTER — Other Ambulatory Visit: Payer: Self-pay | Admitting: Primary Care

## 2018-08-19 DIAGNOSIS — E038 Other specified hypothyroidism: Secondary | ICD-10-CM

## 2018-09-09 ENCOUNTER — Telehealth: Payer: Self-pay | Admitting: *Deleted

## 2018-09-09 NOTE — Telephone Encounter (Signed)
[name] called stating that his dad's left foot inside the ankle is red, swollen, sore to the touch and warm like a fever. [name] stated that patient had surgery on that foot years ago and he got an infection in his ankle. Ellin Saba stated that his ankle looks worse than it did when he got there this morning and has puss coming out of it. [name] stated that he does not know if he hit his ankle,was  bitten by something or just poor circulation After talking with Allie Bossier NP patient's son was advised to take him to an urgent care to be evaluated. Ellin Saba stated that he will take him to [facility] to be evaluated.Leonard Berg

## 2018-09-09 NOTE — Telephone Encounter (Signed)
Spoken and notified patient's wife of Leonard Berg's comments. Patient's wife verbalized understanding.  

## 2018-09-09 NOTE — Telephone Encounter (Signed)
He likely needs ED evaluation as he may require IV antibiotics. Please check to see if they have sought care.

## 2018-09-19 ENCOUNTER — Other Ambulatory Visit: Payer: Self-pay | Admitting: Primary Care

## 2018-09-19 DIAGNOSIS — I10 Essential (primary) hypertension: Secondary | ICD-10-CM

## 2018-09-26 DIAGNOSIS — Z08 Encounter for follow-up examination after completed treatment for malignant neoplasm: Secondary | ICD-10-CM | POA: Diagnosis not present

## 2018-09-26 DIAGNOSIS — D2261 Melanocytic nevi of right upper limb, including shoulder: Secondary | ICD-10-CM | POA: Diagnosis not present

## 2018-09-26 DIAGNOSIS — D2262 Melanocytic nevi of left upper limb, including shoulder: Secondary | ICD-10-CM | POA: Diagnosis not present

## 2018-09-26 DIAGNOSIS — D2272 Melanocytic nevi of left lower limb, including hip: Secondary | ICD-10-CM | POA: Diagnosis not present

## 2018-09-26 DIAGNOSIS — C44311 Basal cell carcinoma of skin of nose: Secondary | ICD-10-CM | POA: Diagnosis not present

## 2018-10-04 ENCOUNTER — Other Ambulatory Visit: Payer: Self-pay | Admitting: Primary Care

## 2018-10-04 DIAGNOSIS — E038 Other specified hypothyroidism: Secondary | ICD-10-CM

## 2018-10-05 MED ORDER — LEVOTHYROXINE SODIUM 112 MCG PO TABS: ORAL_TABLET | ORAL | 0 refills | Status: DC

## 2018-10-18 ENCOUNTER — Other Ambulatory Visit: Payer: Self-pay | Admitting: Primary Care

## 2018-10-18 DIAGNOSIS — E785 Hyperlipidemia, unspecified: Secondary | ICD-10-CM

## 2018-10-18 DIAGNOSIS — I10 Essential (primary) hypertension: Secondary | ICD-10-CM

## 2018-11-17 ENCOUNTER — Other Ambulatory Visit: Payer: Self-pay | Admitting: Primary Care

## 2018-11-17 DIAGNOSIS — E785 Hyperlipidemia, unspecified: Secondary | ICD-10-CM

## 2018-11-23 DIAGNOSIS — L988 Other specified disorders of the skin and subcutaneous tissue: Secondary | ICD-10-CM | POA: Diagnosis not present

## 2018-11-23 DIAGNOSIS — C44311 Basal cell carcinoma of skin of nose: Secondary | ICD-10-CM | POA: Diagnosis not present

## 2018-11-23 DIAGNOSIS — L578 Other skin changes due to chronic exposure to nonionizing radiation: Secondary | ICD-10-CM | POA: Diagnosis not present

## 2018-11-23 DIAGNOSIS — L814 Other melanin hyperpigmentation: Secondary | ICD-10-CM | POA: Diagnosis not present

## 2018-12-17 ENCOUNTER — Other Ambulatory Visit: Payer: Self-pay | Admitting: Primary Care

## 2018-12-17 ENCOUNTER — Other Ambulatory Visit: Payer: Self-pay | Admitting: Cardiovascular Disease

## 2018-12-17 DIAGNOSIS — I10 Essential (primary) hypertension: Secondary | ICD-10-CM

## 2018-12-20 NOTE — Telephone Encounter (Signed)
Age 85, weight 104kg, SCr 1.43 on 04/19/18, CrCl 54.11mL/min, last OV July 2019, note states pt takes Xarelto for recurrent PE, also has PAF on his problem list, dosing is appropriate. Has annual f/u scheduled in November 2020

## 2019-01-10 DIAGNOSIS — N189 Chronic kidney disease, unspecified: Secondary | ICD-10-CM | POA: Diagnosis not present

## 2019-01-10 DIAGNOSIS — N2581 Secondary hyperparathyroidism of renal origin: Secondary | ICD-10-CM | POA: Diagnosis not present

## 2019-01-10 DIAGNOSIS — N183 Chronic kidney disease, stage 3 (moderate): Secondary | ICD-10-CM | POA: Diagnosis not present

## 2019-01-10 DIAGNOSIS — L03116 Cellulitis of left lower limb: Secondary | ICD-10-CM | POA: Diagnosis not present

## 2019-01-10 DIAGNOSIS — I129 Hypertensive chronic kidney disease with stage 1 through stage 4 chronic kidney disease, or unspecified chronic kidney disease: Secondary | ICD-10-CM | POA: Diagnosis not present

## 2019-02-15 ENCOUNTER — Other Ambulatory Visit: Payer: Self-pay | Admitting: Primary Care

## 2019-02-15 DIAGNOSIS — I10 Essential (primary) hypertension: Secondary | ICD-10-CM

## 2019-02-17 ENCOUNTER — Other Ambulatory Visit: Payer: Self-pay

## 2019-02-17 ENCOUNTER — Ambulatory Visit: Payer: Medicare Other | Admitting: Cardiovascular Disease

## 2019-02-17 ENCOUNTER — Encounter: Payer: Self-pay | Admitting: Cardiovascular Disease

## 2019-02-17 VITALS — BP 126/72 | HR 112 | Ht 74.0 in | Wt 237.2 lb

## 2019-02-17 DIAGNOSIS — I4891 Unspecified atrial fibrillation: Secondary | ICD-10-CM | POA: Diagnosis not present

## 2019-02-17 DIAGNOSIS — I10 Essential (primary) hypertension: Secondary | ICD-10-CM

## 2019-02-17 DIAGNOSIS — I2510 Atherosclerotic heart disease of native coronary artery without angina pectoris: Secondary | ICD-10-CM

## 2019-02-17 DIAGNOSIS — E782 Mixed hyperlipidemia: Secondary | ICD-10-CM | POA: Diagnosis not present

## 2019-02-17 MED ORDER — METOPROLOL SUCCINATE ER 50 MG PO TB24: 50.0000 mg | ORAL_TABLET | Freq: Every day | ORAL | 3 refills | Status: DC

## 2019-02-17 NOTE — Patient Instructions (Addendum)
Medication Instructions:  1) STOP AMLODIPINE 2) INCREASE TOPROL to 50 mg daily  Testing/Procedures: Your provider has requested that you have an echocardiogram. Echocardiography is a painless test that uses sound waves to create images of your heart. It provides your doctor with information about the size and shape of your heart and how well your heart's chambers and valves are working. This procedure takes approximately one hour. There are no restrictions for this procedure.  Follow-Up: You are scheduled with Dr. Antionette Char assistant, Robbie Lis, on 11/24 at 10:30AM.

## 2019-02-17 NOTE — Progress Notes (Signed)
Cardiology Office Note:    Date:  02/17/2019   ID:  Leonard [name], DOB 1929-08-10, MRN [id number]  PCP:  [name], NP  Cardiologist:  [name], MD  Electrophysiologist:  None   Referring MD: [name], NP   Chief Complaint  Patient presents with  . Fatigue   History of Present Illness:    Leonard PICCINI Sr. is a 85 y.o. male with a hx of coronary artery disease status post multivessel CABG in 2011, peripheral arterial disease with renal artery stenting in 2012, and carotid disease with history of bilateral carotid endarterectomies in 2013.  The patient has a history of recurrent pulmonary emboli and he is maintained on long-term oral anticoagulation with rivaroxaban.  He is here alone today.  He has been limited over time by orthopedic and back problems.  However, over the last 2 to 3 months he has felt increasingly fatigued.  He has some exertional dyspnea.  He denies orthopnea, PND, or chest pain.  He has had no leg swelling, heart palpitations, lightheadedness, or syncope.  Past Medical History:  Diagnosis Date  . Anemia   . Anxiety   . Arrhythmia    PAROXYSMAL ATRIAL FIBRILLATION  . Arthritis    OSTEOARTHRITIS  . Cataracts, bilateral   . Chronic back pain   . Coronary artery disease 8/11   CABG...LM EMERGENT WITH IABP sees Dr. Legrand Como Bernhard Koskinen  . DJD (degenerative joint disease)   . DVT (deep venous thrombosis) ([place])   . Dyspnea   . GERD (gastroesophageal reflux disease)   . History of BPH   . Hyperlipidemia   . Hypertension   . Hypothyroidism   . Lumbar post-laminectomy syndrome   . Myocardial infarction ([place])   . Pulmonary embolism ([place]) 2009   hx of  . RBBB (right bundle branch block)   . Renal artery stenosis ([place])   . Thrombocytopenia ([place])   . Thyroid disease    HYPOTHYROIDISM  . Unstable angina ([place])    NONE IN LONG TIME    Past Surgical History:  Procedure Laterality Date  . Blood clot  Feb.20,  2016   Pulmonary  Embolism  . CAROTID ENDARTERECTOMY Right Oct. 17, 2013   CE  . CAROTID ENDARTERECTOMY Left Dec. 3, 2016   CE  . CATARACT EXTRACTION  05/2014,06/2014  . CORONARY ARTERY BYPASS GRAFT  11-21-09   EMERGENT X 3 GRAFTING. ..[name], MD, cc: [name]  . ENDARTERECTOMY  01/28/2012   Procedure: ENDARTERECTOMY CAROTID;  Surgeon: [name], MD;  Location: [facility];  Service: Vascular;  Laterality: Right;  with Primary Closure of Artery  . ENDARTERECTOMY  03/15/2012   Procedure: ENDARTERECTOMY CAROTID;  Surgeon: [name], MD;  Location: [facility];  Service: Vascular;  Laterality: Left;  . FOOT SURGERY    . MANDIBLE SURGERY    . MAXIMUM ACCESS (MAS)POSTERIOR LUMBAR INTERBODY FUSION (PLIF) 2 LEVEL N/A 08/08/2015   Procedure: Lumbar Four-Five, Lumbar Five-Sacral One Maximum access posterior lumbar interbody fusion;  Surgeon: [name], MD;  Location: [facility];  Service: Neurosurgery;  Laterality: N/A;  LUMBAR FOUR-FIVE ,LUMBAR FIVE -SACRAL Maximum access posterior lumbar interbody fusion  . RENAL ARTERY STENT  2010  . REPLACEMENT TOTAL KNEE  2006   RIGHT  . SPINAL CORD STIMULATOR INSERTION N/A 06/18/2017   Procedure: LUMBAR SPINAL CORD STIMULATOR INSERTION;  Surgeon: [name], MD;  Location: [facility];  Service: Neurosurgery;  Laterality: N/A;  LUMBAR SPINAL CORD STIMULATOR  INSERTION  . TOTAL HIP ARTHROPLASTY     right    Current Medications: Current Meds  Medication Sig  . aspirin 81 MG tablet Take 162 mg by mouth daily.   . cetirizine (ZYRTEC) 10 MG tablet Take 10 mg by mouth daily.  Marland Kitchen docusate sodium (COLACE) 100 MG capsule Take 200 mg by mouth daily as needed for mild constipation.  . furosemide (LASIX) 40 MG tablet Take 1 tablet (40 mg total) by mouth daily.  Marland Kitchen guaiFENesin (MUCINEX) 600 MG 12 hr tablet Take 600 mg by mouth 2 (two) times daily as needed for cough or to loosen phlegm.  Marland Kitchen levothyroxine (SYNTHROID) 112 MCG tablet TAKE 1 TAB BY MOUTH ONCE DAILY.  TAKE ON AN EMPTY STOMACH WITH A GLASSOF WATER ATLEAST 30-60 MINUTES BEFORE BREAKFAST  . lubiprostone (AMITIZA) 24 MCG capsule Take 24 mcg by mouth 2 (two) times daily with a meal.  . metoprolol succinate (TOPROL-XL) 50 MG 24 hr tablet Take 1 tablet (50 mg total) by mouth daily.  . mometasone (NASONEX) 50 MCG/ACT nasal spray Place 2 sprays into the nose daily as needed (for allergies).  . Multiple Vitamins-Minerals (MULTIVITAMIN PO) Take 1 tablet by mouth daily.  . mupirocin ointment (BACTROBAN) 2 % Apply 1 application topically 2 (two) times daily.  . ranitidine (ZANTAC) 150 MG tablet Take 150 mg by mouth 2 (two) times daily as needed for heartburn.   . rosuvastatin (CRESTOR) 10 MG tablet TAKE 1 TABLET BY MOUTH IN THE EVENING  . tamsulosin (FLOMAX) 0.4 MG CAPS capsule TAKE 1 CAPSULE BY MOUTH ONCE DAILY  . XARELTO 20 MG TABS tablet Take 1 tablet (20 mg total) by mouth daily with supper.  . [DISCONTINUED] amLODipine (NORVASC) 5 MG tablet TAKE 1 TABLET BY MOUTH ONCE DAILY  . [DISCONTINUED] metoprolol succinate (TOPROL-XL) 25 MG 24 hr tablet TAKE 1 TABLET BY MOUTH ONCE A DAY AFTER SUPPER     Allergies:   Patient has no known allergies.   Social History   Socioeconomic History  . Marital status: Married    Spouse name: Not on file  . Number of children: 2  . Years of education: Not on file  . Highest education level: Not on file  Occupational History  . 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 Financial resource strain: Not on file  . Food insecurity    Worry: Not on file    Inability: Not on file  . Transportation needs    Medical: Not on file    Non-medical: Not on file  Tobacco Use  . Smoking status: Former Smoker    Types: Cigarettes    Quit date: 04/13/1974    Years since quitting: 44.8  . Smokeless tobacco: Former Systems developer    Types: Chew    Quit date: 01/26/2002  . Tobacco comment: He has smoked about  27-pack-year hx   Substance and Sexual Activity  . Alcohol use: Yes    Alcohol/week: 2.0 standard drinks    Types: 2 Cans of beer per week    Comment: He used to drink more heavily , but rarely drinks at the current time  . Drug use: No  . Sexual activity: Not on file  Lifestyle  . Physical activity    Days per week: Not on file    Minutes per session: Not on file  . Stress: Not on file  Relationships  . Social connections  Talks on phone: Not on file    Gets together: Not on file    Attends religious service: Not on file    Active member of club or organization: Not on file    Attends meetings of clubs or organizations: Not on file    Relationship status: Not on file  Other Topics Concern  . Not on file  Social History Narrative   LIVES IN [place] WITH HIS WIFE.   HE HAS 2 GROWN CHILDREN   HE IS RETIRED PRISON FIRM SUPERINTENDENT.   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   HE GOES TO THE [employer] 3 X WEEKLY.     Family History: The patient's family history includes Cancer in his sister; Cancer (age of onset: 70) in his father; Diabetes in his brother; Heart attack (age of onset: 63) in his brother; Hyperlipidemia in his mother; Liver cancer (age of onset: 65) in his sister; Lung cancer (age of onset: 45) in his father; Stroke (age of onset: 63) in his mother.  ROS:   Please see the history of present illness.    All other systems reviewed and are negative.  EKGs/Labs/Other Studies Reviewed:    The following studies were reviewed today: Echo 08/28/2016: Study Conclusions  - Left ventricle: The cavity size was mildly dilated. There was   mild concentric hypertrophy. Systolic function was normal. The   estimated ejection fraction was in the range of 55% to 60%. Wall   motion was normal; there were no regional wall motion   abnormalities. Features are consistent with a pseudonormal left   ventricular filling pattern, with  concomitant abnormal relaxation   and increased filling pressure (grade 2 diastolic dysfunction). - Aortic valve: Trileaflet; mildly thickened, mildly calcified   leaflets. - Mitral valve: Minimal focal calcification of the anterior leaflet   (medial segment(s)). - Tricuspid valve: There was trivial regurgitation.  EKG:  EKG is ordered today.  The ekg ordered today demonstrates atrial fibrillation with rapid ventricular response 114 bpm, right bundle branch block, possible inferior infarct age undetermined.  Recent Labs: 04/19/2018: ALT 14; BUN 20; Creatinine, Ser 1.43; Potassium 3.6; Sodium 140 08/03/2018: TSH 0.56  Recent Lipid Panel    Component Value Date/Time   CHOL 143 04/19/2018 1038   TRIG 139.0 04/19/2018 1038   HDL 37.50 (L) 04/19/2018 1038   CHOLHDL 4 04/19/2018 1038   VLDL 27.8 04/19/2018 1038   LDLCALC 78 04/19/2018 1038   LDLDIRECT 100.8 08/12/2010 1523    Physical Exam:    VS:  BP 126/72   Pulse (!) 112   Ht 6\' 2"  (1.88 m)   Wt 237 lb 3.2 oz (107.6 kg)   SpO2 94%   BMI 30.45 kg/m     Wt Readings from Last 3 Encounters:  02/17/19 237 lb 3.2 oz (107.6 kg)  04/19/18 229 lb (103.9 kg)  11/01/17 235 lb (106.6 kg)     GEN:  Well nourished, well developed elderly male in no acute distress HEENT: Normal NECK: No JVD; No carotid bruits LYMPHATICS: No lymphadenopathy CARDIAC: tachy and irregular, distant heart sounds, no murmurs, rubs, gallops RESPIRATORY:  Clear to auscultation without rales, wheezing or rhonchi  ABDOMEN: Soft, non-tender, non-distended MUSCULOSKELETAL:  No edema; No deformity  SKIN: Warm and dry NEUROLOGIC:  Alert and oriented x 3 PSYCHIATRIC:  Normal affect   ASSESSMENT:    1. New onset a-fib ([place])   2. Essential hypertension, benign   3.  Coronary artery disease involving native coronary artery of native heart without angina pectoris   4. Mixed hyperlipidemia    PLAN:    In order of problems listed above:  1. The patient has new  onset atrial fibrillation of unknown duration.  I suspect this is at least partly responsible for his worsening fatigue and exercise intolerance.  I recommended that we check an echocardiogram.  We will increase his metoprolol succinate to 50 mg daily.  He is already anticoagulated with rivaroxaban.  We discussed treatment options for atrial fibrillation, importance of consistency with anticoagulation to reduce cardioembolic risk, and reviewed rhythm versus rate control strategies.  We will assess his echo before making a final decision.  I think if he has reduced LV function and/or difficult to control atrial fibrillation, it would be reasonable to treat him with amiodarone and cardioversion.  Will arrange follow-up in a few weeks. 2. Blood pressure is in the low normal range.  We will stop amlodipine to allow for more blood pressure room to increase his metoprolol succinate. 3. Stable without symptoms of angina.  He continues on low-dose aspirin and a beta-blocker. 4. Treated with Crestor 10 mg daily.  Last lipids show an LDL cholesterol of 78 mg/dL.   Medication Adjustments/Labs and Tests Ordered: Current medicines are reviewed at length with the patient today.  Concerns regarding medicines are outlined above.  Orders Placed This Encounter  Procedures  . EKG 12-Lead  . ECHOCARDIOGRAM COMPLETE   Meds ordered this encounter  Medications  . metoprolol succinate (TOPROL-XL) 50 MG 24 hr tablet    Sig: Take 1 tablet (50 mg total) by mouth daily.    Dispense:  90 tablet    Refill:  3    Patient Instructions  Medication Instructions:  1) STOP AMLODIPINE 2) INCREASE TOPROL to 50 mg daily  Testing/Procedures: Your provider has requested that you have an echocardiogram. Echocardiography is a painless test that uses sound waves to create images of your heart. It provides your doctor with information about the size and shape of your heart and how well your heart's chambers and valves are working.  This procedure takes approximately one hour. There are no restrictions for this procedure.  Follow-Up: You are scheduled with Dr. Antionette Char assistant, Robbie Lis, on 11/24 at 10:30AM.    Signed, [name], MD  02/17/2019 5:45 PM    [place]

## 2019-02-22 DIAGNOSIS — D2262 Melanocytic nevi of left upper limb, including shoulder: Secondary | ICD-10-CM | POA: Diagnosis not present

## 2019-02-22 DIAGNOSIS — L821 Other seborrheic keratosis: Secondary | ICD-10-CM | POA: Diagnosis not present

## 2019-02-22 DIAGNOSIS — D225 Melanocytic nevi of trunk: Secondary | ICD-10-CM | POA: Diagnosis not present

## 2019-02-22 DIAGNOSIS — Z85828 Personal history of other malignant neoplasm of skin: Secondary | ICD-10-CM | POA: Diagnosis not present

## 2019-02-24 ENCOUNTER — Telehealth: Payer: Self-pay | Admitting: Cardiovascular Disease

## 2019-02-24 NOTE — Telephone Encounter (Signed)
New Message  [name] is calling in to see if there are any discount cards for the Xarelto and to also get approval to be in attendance to appointment with the patient. States that the patient has trouble hearing and refuses to wear his hearing aides. [name] is wanting to be in attendance to the patient's appointment with B. Bhagat on 03/07/19 at 10:30 am for that reason. Please give a call back to confirm attending the appointment and about the discount card for Xarelto,

## 2019-02-24 NOTE — Telephone Encounter (Signed)
I will route to refills for Xarelto card. I will route to triage about appt 11/24 and son wanting to come.

## 2019-02-24 NOTE — Telephone Encounter (Signed)
Called pt's son and explained to the son that the pt, because he is over 85 years old would not be able to use the co-pay card. Son stated that the pt is in the donut hole and would like to know if someone could help the pt get his medication. Jeani Hawking, LPN could you please assist the pt on this matter? Thanks

## 2019-02-24 NOTE — Telephone Encounter (Signed)
Informed patient's son (DPR) that there is no visitors policy at this time. [name] was okay with this.

## 2019-02-24 NOTE — Telephone Encounter (Signed)
**Note De-Identified Leonard Berg Obfuscation** The pts son Heath Lark [facility]) states that the pts Xarelto has gone up in price and wants to know if we offer any discounts. I advised Heath Lark that since the pt has medicare we have no discounts but that he maybe eligible for pt asst through [facility] pt asst program and if he is accepted he will receives his Xarelto free of charge for the remainder of this year.  Heath Lark states that he is interested in the pt asst program with [employer] so I gave him thier phone number and advised him to contact therm with questions concerning the pts eligibility to apply and to either ask them to mail him an application or print one from their web site.   He thanked me for calling him back so quickly and for my assistance.  (Per Heath Lark the pt has plenty of Xarelto on hand as he just purchased a refill.)

## 2019-02-28 ENCOUNTER — Ambulatory Visit (HOSPITAL_COMMUNITY): Payer: Medicare Other | Attending: Cardiology

## 2019-02-28 ENCOUNTER — Other Ambulatory Visit: Payer: Self-pay

## 2019-02-28 DIAGNOSIS — I4891 Unspecified atrial fibrillation: Secondary | ICD-10-CM | POA: Diagnosis not present

## 2019-03-07 ENCOUNTER — Other Ambulatory Visit: Payer: Self-pay

## 2019-03-07 ENCOUNTER — Telehealth: Payer: Self-pay | Admitting: *Deleted

## 2019-03-07 ENCOUNTER — Ambulatory Visit: Payer: Medicare Other | Admitting: Physician Assistant

## 2019-03-07 ENCOUNTER — Encounter: Payer: Self-pay | Admitting: Physician Assistant

## 2019-03-07 VITALS — BP 132/80 | HR 109 | Ht 74.0 in | Wt 238.8 lb

## 2019-03-07 DIAGNOSIS — I10 Essential (primary) hypertension: Secondary | ICD-10-CM

## 2019-03-07 DIAGNOSIS — I2510 Atherosclerotic heart disease of native coronary artery without angina pectoris: Secondary | ICD-10-CM

## 2019-03-07 DIAGNOSIS — I4891 Unspecified atrial fibrillation: Secondary | ICD-10-CM | POA: Diagnosis not present

## 2019-03-07 DIAGNOSIS — I779 Disorder of arteries and arterioles, unspecified: Secondary | ICD-10-CM

## 2019-03-07 DIAGNOSIS — Z01812 Encounter for preprocedural laboratory examination: Secondary | ICD-10-CM | POA: Diagnosis not present

## 2019-03-07 MED ORDER — AMIODARONE HCL 200 MG PO TABS: ORAL_TABLET | ORAL | 3 refills | Status: DC

## 2019-03-07 MED ORDER — ASPIRIN EC 81 MG PO TBEC: 81.0000 mg | DELAYED_RELEASE_TABLET | Freq: Every day | ORAL | 3 refills | Status: DC

## 2019-03-07 NOTE — Telephone Encounter (Signed)
-----   Message from [place], [place] sent at 03/07/2019  4:22 PM EST ----- Reduce aspirin to 81mg  daily.

## 2019-03-07 NOTE — Telephone Encounter (Signed)
Pt gave verbal permission to speak with son, Christerpher Kishi.  He has been made aware to reduce the Aspirin to 81 mg daily. He was grateful for the call.

## 2019-03-07 NOTE — Progress Notes (Addendum)
Cardiology Office Note    Date:  03/07/2019   ID:  Leonard [name], DOB 03/27/1930, MRN [id number]  PCP:  [name], NP  Cardiologist:  Dr. Burt Knack  Chief Complaint: 2 weeks follow up  History of Present Illness:   Leonard FARWELL Sr. is a 85 y.o. male with a hx of CAD s/p CABG in 2011, PAD with renal artery stenting in 2012, and carotid disease with history of bilateral carotid endarterectomies in 2013, recurrent pulmonary emboli (on long-term oral anticoagulation with rivaroxaban), HLD and thyroid disease seen for follow up.   Seen by Dr. Burt Knack 02/17/2019 for fatigue however found to have new onset afib of unknown duration. Increase Toprol XL to 50mg  qd. Stopped Amlodipine.   Echo 02/28/2019 showed LVEF of 30-35% with moderate diastolic dysfunction. Mildly reduce RV function. Normal LA size.   Here today for follow up. Continue to feel fatigue and tired. Compliant with medications. No chest pain, orthopnea, PND or syncope. No bleeding issue.    Past Medical History:  Diagnosis Date  . Anemia   . Anxiety   . Arrhythmia    PAROXYSMAL ATRIAL FIBRILLATION  . Arthritis    OSTEOARTHRITIS  . Cataracts, bilateral   . Chronic back pain   . Coronary artery disease 8/11   CABG...LM EMERGENT WITH IABP sees Dr. Legrand Como cooper  . DJD (degenerative joint disease)   . DVT (deep venous thrombosis) ([place])   . Dyspnea   . GERD (gastroesophageal reflux disease)   . History of BPH   . Hyperlipidemia   . Hypertension   . Hypothyroidism   . Lumbar post-laminectomy syndrome   . Myocardial infarction ([place])   . Pulmonary embolism ([place]) 2009   hx of  . RBBB (right bundle branch block)   . Renal artery stenosis ([place])   . Thrombocytopenia ([place])   . Thyroid disease    HYPOTHYROIDISM  . Unstable angina ([place])    NONE IN LONG TIME    Past Surgical History:  Procedure Laterality Date  . Blood clot  Feb.20,  2016   Pulmonary Embolism  . CAROTID ENDARTERECTOMY Right  Oct. 17, 2013   CE  . CAROTID ENDARTERECTOMY Left Dec. 3, 2016   CE  . CATARACT EXTRACTION  05/2014,06/2014  . CORONARY ARTERY BYPASS GRAFT  11-21-09   EMERGENT X 3 GRAFTING. ..[name], MD, cc: [name]  . ENDARTERECTOMY  01/28/2012   Procedure: ENDARTERECTOMY CAROTID;  Surgeon: [name], MD;  Location: [facility];  Service: Vascular;  Laterality: Right;  with Primary Closure of Artery  . ENDARTERECTOMY  03/15/2012   Procedure: ENDARTERECTOMY CAROTID;  Surgeon: [name], MD;  Location: [facility];  Service: Vascular;  Laterality: Left;  . FOOT SURGERY    . MANDIBLE SURGERY    . MAXIMUM ACCESS (MAS)POSTERIOR LUMBAR INTERBODY FUSION (PLIF) 2 LEVEL N/A 08/08/2015   Procedure: Lumbar Four-Five, Lumbar Five-Sacral One Maximum access posterior lumbar interbody fusion;  Surgeon: [name], MD;  Location: [facility];  Service: Neurosurgery;  Laterality: N/A;  LUMBAR FOUR-FIVE ,LUMBAR FIVE -SACRAL Maximum access posterior lumbar interbody fusion  . RENAL ARTERY STENT  2010  . REPLACEMENT TOTAL KNEE  2006   RIGHT  . SPINAL CORD STIMULATOR INSERTION N/A 06/18/2017   Procedure: LUMBAR SPINAL CORD STIMULATOR INSERTION;  Surgeon: [name], MD;  Location: [facility];  Service: Neurosurgery;  Laterality: N/A;  LUMBAR SPINAL CORD STIMULATOR INSERTION  . TOTAL HIP ARTHROPLASTY     right  Current Medications: Prior to Admission medications   Medication Sig Start Date End Date Taking? Authorizing Provider  aspirin 81 MG tablet Take 162 mg by mouth daily.     [provider]  cetirizine (ZYRTEC) 10 MG tablet Take 10 mg by mouth daily.    [provider]  docusate sodium (COLACE) 100 MG capsule Take 200 mg by mouth daily as needed for mild constipation.    [provider]  furosemide (LASIX) 40 MG tablet Take 1 tablet (40 mg total) by mouth daily. 06/16/17   [name], MD  guaiFENesin (MUCINEX) 600 MG 12 hr tablet Take 600 mg by mouth 2 (two)  times daily as needed for cough or to loosen phlegm.    [provider]  levothyroxine (SYNTHROID) 112 MCG tablet TAKE 1 TAB BY MOUTH ONCE DAILY. TAKE ON AN EMPTY STOMACH WITH A GLASSOF WATER ATLEAST 30-60 MINUTES BEFORE BREAKFAST 10/05/18   [name], NP  lubiprostone (AMITIZA) 24 MCG capsule Take 24 mcg by mouth 2 (two) times daily with a meal.    [provider]  metoprolol succinate (TOPROL-XL) 50 MG 24 hr tablet Take 1 tablet (50 mg total) by mouth daily. 02/17/19 02/12/20  [name], MD  mometasone (NASONEX) 50 MCG/ACT nasal spray Place 2 sprays into the nose daily as needed (for allergies).    [provider]  Multiple Vitamins-Minerals (MULTIVITAMIN PO) Take 1 tablet by mouth daily.    [provider]  mupirocin ointment (BACTROBAN) 2 % Apply 1 application topically 2 (two) times daily. 12/03/17   Hilts, Legrand Como, MD  ranitidine (ZANTAC) 150 MG tablet Take 150 mg by mouth 2 (two) times daily as needed for heartburn.     [provider]  rosuvastatin (CRESTOR) 10 MG tablet TAKE 1 TABLET BY MOUTH IN THE EVENING 11/18/18   [name], NP  tamsulosin (FLOMAX) 0.4 MG CAPS capsule TAKE 1 CAPSULE BY MOUTH ONCE DAILY 12/17/18   [name], NP  XARELTO 20 MG TABS tablet Take 1 tablet (20 mg total) by mouth daily with supper. 12/20/18   [name], MD    Allergies:   Patient has no known allergies.   Social History   Socioeconomic History  . Marital status: Married    Spouse name: Not on file  . Number of children: 2  . Years of education: Not on file  . Highest education level: Not on file  Occupational History  . 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 Financial resource strain: Not on file  . Food insecurity    Worry: Not on file    Inability: Not on file  . Transportation needs    Medical: Not on file    Non-medical: Not on file   Tobacco Use  . Smoking status: Former Smoker    Types: Cigarettes    Quit date: 04/13/1974    Years since quitting: 44.9  . Smokeless tobacco: Former Systems developer    Types: Chew    Quit date: 01/26/2002  . Tobacco comment: He has smoked about 27-pack-year hx   Substance and Sexual Activity  . Alcohol use: Yes    Alcohol/week: 2.0 standard drinks    Types: 2 Cans of beer per week    Comment: He used to drink more heavily , but rarely drinks at the current time  . Drug use: No  . Sexual activity: Not on  file  Lifestyle  . Physical activity    Days per week: Not on file    Minutes per session: Not on file  . Stress: Not on file  Relationships  . Social Herbalist on phone: Not on file    Gets together: Not on file    Attends religious service: Not on file    Active member of club or organization: Not on file    Attends meetings of clubs or organizations: Not on file    Relationship status: Not on file  Other Topics Concern  . Not on file  Social History Narrative   LIVES IN [place] WITH HIS WIFE.   HE HAS 2 GROWN CHILDREN   HE IS RETIRED PRISON FIRM SUPERINTENDENT.   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   HE GOES TO THE [employer] 3 X WEEKLY.     Family History:  The patient's family history includes Cancer in his sister; Cancer (age of onset: 66) in his father; Diabetes in his brother; Heart attack (age of onset: 26) in his brother; Hyperlipidemia in his mother; Liver cancer (age of onset: 71) in his sister; Lung cancer (age of onset: 26) in his father; Stroke (age of onset: 25) in his mother.   ROS:   Please see the history of present illness.    ROS All other systems reviewed and are negative.   PHYSICAL EXAM:   VS:  BP 132/80   Pulse (!) 109   Ht 6\' 2"  (1.88 m)   Wt 238 lb 12.8 oz (108.3 kg)   SpO2 97%   BMI 30.66 kg/m    GEN: Well nourished, well developed, in no acute distress  HEENT: normal  Neck: no JVD, carotid  bruits, or masses Cardiac: Ir Ir tachycardia; no murmurs, rubs, or gallops,trace LE edema Respiratory:  clear to auscultation bilaterally, normal work of breathing GI: soft, nontender, nondistended, + BS MS: no deformity or atrophy  Skin: warm and dry, no rash Neuro:  Alert and Oriented x 3, Strength and sensation are intact Psych: euthymic mood, full affect  Wt Readings from Last 3 Encounters:  03/07/19 238 lb 12.8 oz (108.3 kg)  02/17/19 237 lb 3.2 oz (107.6 kg)  04/19/18 229 lb (103.9 kg)      Studies/Labs Reviewed:   EKG:  EKG is ordered today.  The ekg ordered today demonstrates atrial fibrillation at rate of 116 bpm  Recent Labs: 04/19/2018: ALT 14; BUN 20; Creatinine, Ser 1.43; Potassium 3.6; Sodium 140 08/03/2018: TSH 0.56   Lipid Panel    Component Value Date/Time   CHOL 143 04/19/2018 1038   TRIG 139.0 04/19/2018 1038   HDL 37.50 (L) 04/19/2018 1038   CHOLHDL 4 04/19/2018 1038   VLDL 27.8 04/19/2018 1038   LDLCALC 78 04/19/2018 1038   LDLDIRECT 100.8 08/12/2010 1523    Additional studies/ records that were reviewed today include:  As summarized above  ASSESSMENT & PLAN:    1. Persistent atrial fibrillation with RVR - No improvement of dyspnea or energy. HR of 116 bpm today. He is symptomatic with afib. It would be beneficial to keep in sinus rhythm. Declined going to ER for cardioversion. Add amiodarone 200mg  BID x 7 day then 200mg  daily. Compliant with [facility]. No bleeding issue. Outpatient cardioversion 12/1.  2. Chronic combined CHF - Likely due to unknown duration of afib. Echo 02/28/2019 showed LVEF of 30-35% with moderate diastolic dysfunction. Mildly  reduce RV function. Normal LA size. If no improvement of his symptoms after restoration of sinus rhythm >> may consider ischemic evaluation.   3. HTN - BP stable on BB.   4. CAD - No chest pain. Continue ASA, statin and BB.   5.  Recurrent pulmonary emboli (on long-term oral anticoagulation with  rivaroxaban) - compliant   Medication Adjustments/Labs and Tests Ordered: Current medicines are reviewed at length with the patient today.  Concerns regarding medicines are outlined above.  Medication changes, Labs and Tests ordered today are listed in the Patient Instructions below. Patient Instructions  Medication Instructions:  Your physician has recommended you make the following change in your medication:  START Amiodarone 200 mg by mouth twice daily for 7 days, then take daily.   *If you need a refill on your cardiac medications before your next appointment, please call your pharmacy*  Lab Work: Your physician recommends that you have lab work today- BMET and CBC  If you have labs (blood work) drawn today and your tests are completely normal, you will receive your results only by: Marland Kitchen MyChart Message (if you have MyChart) OR . A paper copy in the mail If you have any lab test that is abnormal or we need to change your treatment, we will call you to review the results.  Your Pre-procedure COVID-19 Testing will be done on Saturday November 28th, 2020 at [facility] at [id number], [place], [place] [address]. Once you arrive at the testing site, stay in the right hand lane, go under the building overhang not the tent. If you are tested under the tent your results may not be back before your procedure. Please be on time for your appointment.  After your swab you will be given a mask to wear and instructed to go home and quarantine/no visitors until after your procedure. If you test positive you will be notified and your procedure will be cancelled.   Testing/Procedures: Your physician has recommended that you have a Cardioversion (DCCV). Electrical Cardioversion uses a jolt of electricity to your heart either through paddles or wired patches attached to your chest. This is a controlled, usually prescheduled, procedure. Defibrillation is done under light  anesthesia in the hospital, and you usually go home the day of the procedure. This is done to get your heart back into a normal rhythm. You are not awake for the procedure. Please see the instruction sheet given to you today.  Follow-Up: At [facility], you and your health needs are our priority.  As part of our continuing mission to provide you with exceptional heart care, we have created designated Provider Care Teams.  These Care Teams include your primary Cardiologist (physician) and Advanced Practice Providers (APPs -  Physician Assistants and Nurse Practitioners) who all work together to provide you with the care you need, when you need it.  Your next appointment:   2 week(s) 03/31/19 at 10:30 am  The format for your next appointment:   In Person  Provider:   You may see [name], MD or one of the following Advanced Practice Providers on your designated Care Team:    Richardson Dopp, PA-C  [place], PA-C  Daune Perch, NP      [place], [place]  03/07/2019 11:11 AM    [place] [address], [place], [place]  [address] Phone: [phone]; Fax: [phone]

## 2019-03-07 NOTE — Patient Instructions (Addendum)
Medication Instructions:  Your physician has recommended you make the following change in your medication:  START Amiodarone 200 mg by mouth twice daily for 7 days, then take daily.   *If you need a refill on your cardiac medications before your next appointment, please call your pharmacy*  Lab Work: Your physician recommends that you have lab work today- BMET and CBC  If you have labs (blood work) drawn today and your tests are completely normal, you will receive your results only by: Marland Kitchen MyChart Message (if you have MyChart) OR . A paper copy in the mail If you have any lab test that is abnormal or we need to change your treatment, we will call you to review the results.  Your Pre-procedure COVID-19 Testing will be done on Saturday November 28th, 2020 at [facility] at [id number], [place], [place] [address]. Once you arrive at the testing site, stay in the right hand lane, go under the building overhang not the tent. If you are tested under the tent your results may not be back before your procedure. Please be on time for your appointment.  After your swab you will be given a mask to wear and instructed to go home and quarantine/no visitors until after your procedure. If you test positive you will be notified and your procedure will be cancelled.   Testing/Procedures: Your physician has recommended that you have a Cardioversion (DCCV). Electrical Cardioversion uses a jolt of electricity to your heart either through paddles or wired patches attached to your chest. This is a controlled, usually prescheduled, procedure. Defibrillation is done under light anesthesia in the hospital, and you usually go home the day of the procedure. This is done to get your heart back into a normal rhythm. You are not awake for the procedure. Please see the instruction sheet given to you today.  Follow-Up: At [facility], you and your health needs are our priority.  As part of  our continuing mission to provide you with exceptional heart care, we have created designated Provider Care Teams.  These Care Teams include your primary Cardiologist (physician) and Advanced Practice Providers (APPs -  Physician Assistants and Nurse Practitioners) who all work together to provide you with the care you need, when you need it.  Your next appointment:   2 week(s) 03/31/19 at 10:30 am  The format for your next appointment:   In Person  Provider:   You may see [name], MD or one of the following Advanced Practice Providers on your designated Care Team:    Richardson Dopp, PA-C  [place], [place]  Daune Perch, [place]

## 2019-03-07 NOTE — Progress Notes (Signed)
Yes would reduce ASA thank you!

## 2019-03-08 LAB — CBC WITH DIFFERENTIAL/PLATELET
Basophils Absolute: 0.1 10*3/uL (ref 0.0–0.2)
Basos: 1 %
EOS (ABSOLUTE): 0.1 10*3/uL (ref 0.0–0.4)
Eos: 2 %
Hematocrit: 41 % (ref 37.5–51.0)
Hemoglobin: 14 g/dL (ref 13.0–17.7)
Immature Grans (Abs): 0 10*3/uL (ref 0.0–0.1)
Immature Granulocytes: 0 %
Lymphocytes Absolute: 2 10*3/uL (ref 0.7–3.1)
Lymphs: 31 %
MCH: 33.8 pg — ABNORMAL HIGH (ref 26.6–33.0)
MCHC: 34.1 g/dL (ref 31.5–35.7)
MCV: 99 fL — ABNORMAL HIGH (ref 79–97)
Monocytes Absolute: 0.8 10*3/uL (ref 0.1–0.9)
Monocytes: 13 %
Neutrophils Absolute: 3.4 10*3/uL (ref 1.4–7.0)
Neutrophils: 53 %
Platelets: 187 10*3/uL (ref 150–450)
RBC: 4.14 x10E6/uL (ref 4.14–5.80)
RDW: 11.6 % (ref 11.6–15.4)
WBC: 6.5 10*3/uL (ref 3.4–10.8)

## 2019-03-08 LAB — BASIC METABOLIC PANEL
BUN/Creatinine Ratio: 12 (ref 10–24)
BUN: 17 mg/dL (ref 8–27)
CO2: 25 mmol/L (ref 20–29)
Calcium: 9.2 mg/dL (ref 8.6–10.2)
Chloride: 105 mmol/L (ref 96–106)
Creatinine, Ser: 1.43 mg/dL — ABNORMAL HIGH (ref 0.76–1.27)
GFR calc Af Amer: 51 mL/min/{1.73_m2} — ABNORMAL LOW (ref >59)
GFR calc non Af Amer: 44 mL/min/{1.73_m2} — ABNORMAL LOW (ref >59)
Glucose: 128 mg/dL — ABNORMAL HIGH (ref 65–99)
Potassium: 4.1 mmol/L (ref 3.5–5.2)
Sodium: 144 mmol/L (ref 134–144)

## 2019-03-11 ENCOUNTER — Other Ambulatory Visit (HOSPITAL_COMMUNITY)
Admission: RE | Admit: 2019-03-11 | Discharge: 2019-03-11 | Disposition: A | Discharge status: Home / Self Care | Payer: Medicare Other | Source: Ambulatory Visit | Attending: Internal Medicine | Admitting: Internal Medicine

## 2019-03-11 DIAGNOSIS — Z20828 Contact with and (suspected) exposure to other viral communicable diseases: Secondary | ICD-10-CM | POA: Insufficient documentation

## 2019-03-11 DIAGNOSIS — Z01812 Encounter for preprocedural laboratory examination: Secondary | ICD-10-CM | POA: Diagnosis not present

## 2019-03-12 LAB — NOVEL CORONAVIRUS, NAA (HOSP ORDER, SEND-OUT TO REF LAB; TAT 18-24 HRS): SARS-CoV-2, NAA: NOT DETECTED

## 2019-03-13 ENCOUNTER — Telehealth: Payer: Self-pay | Admitting: Internal Medicine

## 2019-03-13 NOTE — Telephone Encounter (Signed)
New Message    Pincus is the pts son and is calling stating he is the one bringing the pt to his appt on Wednesday with Dr Debara Pickett for a Procedure  He says he is needing to know directions and information if he is allowed to go inside.    Please call

## 2019-03-13 NOTE — Telephone Encounter (Signed)
Called and advised with son about the instructions, and where to go.  Patient son verbalized understanding, and thankful for call back.

## 2019-03-15 ENCOUNTER — Ambulatory Visit (HOSPITAL_COMMUNITY): Payer: Medicare Other | Admitting: Certified Registered Nurse Anesthetist

## 2019-03-15 ENCOUNTER — Other Ambulatory Visit: Payer: Self-pay

## 2019-03-15 ENCOUNTER — Encounter (HOSPITAL_COMMUNITY): Payer: Self-pay | Admitting: *Deleted

## 2019-03-15 ENCOUNTER — Encounter (HOSPITAL_COMMUNITY)
Admission: RE | Disposition: A | Discharge status: Home / Self Care | Payer: Self-pay | Source: Home / Self Care | Attending: Internal Medicine

## 2019-03-15 ENCOUNTER — Ambulatory Visit (HOSPITAL_COMMUNITY)
Admission: RE | Admit: 2019-03-15 | Discharge: 2019-03-15 | Disposition: A | Discharge status: Home / Self Care | Payer: Medicare Other | Attending: Internal Medicine | Admitting: Internal Medicine

## 2019-03-15 DIAGNOSIS — E785 Hyperlipidemia, unspecified: Secondary | ICD-10-CM | POA: Diagnosis not present

## 2019-03-15 DIAGNOSIS — F419 Anxiety disorder, unspecified: Secondary | ICD-10-CM | POA: Diagnosis not present

## 2019-03-15 DIAGNOSIS — Z79899 Other long term (current) drug therapy: Secondary | ICD-10-CM | POA: Insufficient documentation

## 2019-03-15 DIAGNOSIS — Z7901 Long term (current) use of anticoagulants: Secondary | ICD-10-CM | POA: Diagnosis not present

## 2019-03-15 DIAGNOSIS — I252 Old myocardial infarction: Secondary | ICD-10-CM | POA: Insufficient documentation

## 2019-03-15 DIAGNOSIS — E039 Hypothyroidism, unspecified: Secondary | ICD-10-CM | POA: Insufficient documentation

## 2019-03-15 DIAGNOSIS — I4891 Unspecified atrial fibrillation: Secondary | ICD-10-CM | POA: Diagnosis not present

## 2019-03-15 DIAGNOSIS — Z87891 Personal history of nicotine dependence: Secondary | ICD-10-CM | POA: Insufficient documentation

## 2019-03-15 DIAGNOSIS — Z7982 Long term (current) use of aspirin: Secondary | ICD-10-CM | POA: Diagnosis not present

## 2019-03-15 DIAGNOSIS — I5042 Chronic combined systolic (congestive) and diastolic (congestive) heart failure: Secondary | ICD-10-CM | POA: Diagnosis not present

## 2019-03-15 DIAGNOSIS — I2699 Other pulmonary embolism without acute cor pulmonale: Secondary | ICD-10-CM | POA: Diagnosis not present

## 2019-03-15 DIAGNOSIS — I4819 Other persistent atrial fibrillation: Secondary | ICD-10-CM | POA: Diagnosis not present

## 2019-03-15 DIAGNOSIS — E782 Mixed hyperlipidemia: Secondary | ICD-10-CM | POA: Diagnosis not present

## 2019-03-15 DIAGNOSIS — I2510 Atherosclerotic heart disease of native coronary artery without angina pectoris: Secondary | ICD-10-CM | POA: Diagnosis not present

## 2019-03-15 DIAGNOSIS — Z79890 Hormone replacement therapy (postmenopausal): Secondary | ICD-10-CM | POA: Diagnosis not present

## 2019-03-15 DIAGNOSIS — I110 Hypertensive heart disease with heart failure: Secondary | ICD-10-CM | POA: Diagnosis not present

## 2019-03-15 DIAGNOSIS — K219 Gastro-esophageal reflux disease without esophagitis: Secondary | ICD-10-CM | POA: Insufficient documentation

## 2019-03-15 HISTORY — PX: CARDIOVERSION: SHX1299

## 2019-03-15 LAB — POCT I-STAT, CHEM 8
BUN: 25 mg/dL — ABNORMAL HIGH (ref 8–23)
Calcium, Ion: 1.12 mmol/L — ABNORMAL LOW (ref 1.15–1.40)
Chloride: 104 mmol/L (ref 98–111)
Creatinine, Ser: 1.8 mg/dL — ABNORMAL HIGH (ref 0.61–1.24)
Glucose, Bld: 89 mg/dL (ref 70–99)
HCT: 43 % (ref 39.0–52.0)
Hemoglobin: 14.6 g/dL (ref 13.0–17.0)
Potassium: 3.4 mmol/L — ABNORMAL LOW (ref 3.5–5.1)
Sodium: 142 mmol/L (ref 135–145)
TCO2: 26 mmol/L (ref 22–32)

## 2019-03-15 SURGERY — CARDIOVERSION
Anesthesia: General

## 2019-03-15 MED ORDER — SODIUM CHLORIDE 0.9 % IV SOLN
INTRAVENOUS | Status: DC
  Administered 2019-03-15: 09:00:00 via INTRAVENOUS

## 2019-03-15 MED ORDER — PROPOFOL 10 MG/ML IV BOLUS
INTRAVENOUS | Status: DC | PRN
  Administered 2019-03-15: 70 mg via INTRAVENOUS

## 2019-03-15 MED ORDER — LIDOCAINE 2% (20 MG/ML) 5 ML SYRINGE
INTRAMUSCULAR | Status: DC | PRN
  Administered 2019-03-15: 40 mg via INTRAVENOUS

## 2019-03-15 NOTE — Transfer of Care (Signed)
Immediate Anesthesia Transfer of Care Note  Patient: Leonard [name]  Procedure(s) Performed: CARDIOVERSION (N/A )  Patient Location: Endoscopy Unit  Anesthesia Type:General  Level of Consciousness: drowsy  Airway & Oxygen Therapy: Patient Spontanous Breathing and Patient connected to nasal cannula oxygen  Post-op Assessment: Report given to RN and Post -op Vital signs reviewed and stable  Post vital signs: Reviewed  Last Vitals:  Vitals Value Taken Time  BP    Temp    Pulse    Resp    SpO2      Last Pain:  Vitals:   03/15/19 0811  TempSrc: Oral  PainSc: 0-No pain         Complications: No apparent anesthesia complications

## 2019-03-15 NOTE — H&P (Signed)
   INTERVAL PROCEDURE H&P  History and Physical Interval Note:  03/15/2019 9:31 AM  Leonard Back Apperson Sr. has presented today for their planned procedure. The various methods of treatment have been discussed with the patient and family. After consideration of risks, benefits and other options for treatment, the patient has consented to the procedure.  The patients' outpatient history has been reviewed, patient examined, and no change in status from most recent office note within the past 30 days. I have reviewed the patients' chart and labs and will proceed as planned. Questions were answered to the patient's satisfaction.   Pixie Casino, MD, [facility], [place] Director of the Advanced Lipid Disorders &  Cardiovascular Risk Reduction Clinic Diplomate of the American Board of Clinical Lipidology Attending Cardiologist  Direct Dial: [phone]  Fax: [phone]  Website:  [email or web address] 03/15/2019, 9:31 AM

## 2019-03-15 NOTE — Anesthesia Procedure Notes (Signed)
Procedure Name: General with mask airway Date/Time: 03/15/2019 9:39 AM Performed by: Janene Harvey, CRNA Pre-anesthesia Checklist: Patient identified, Emergency Drugs available, Suction available and Patient being monitored Patient Re-evaluated:Patient Re-evaluated prior to induction Oxygen Delivery Method: Ambu bag Dental Injury: Teeth and Oropharynx as per pre-operative assessment

## 2019-03-15 NOTE — CV Procedure (Signed)
   CARDIOVERSION NOTE  Procedure: Electrical Cardioversion Indications:  Atrial Fibrillation  Procedure Details:  Consent: Risks of procedure as well as the alternatives and risks of each were explained to the (patient/caregiver).  Consent for procedure obtained.  Time Out: Verified patient identification, verified procedure, site/side was marked, verified correct patient position, special equipment/implants available, medications/allergies/relevent history reviewed, required imaging and test results available.  Performed  Patient placed on cardiac monitor, pulse oximetry, supplemental oxygen as necessary.  Sedation given: propofol per anesthesia Pacer pads placed anterior and posterior chest.  Cardioverted 1 time(s).  Cardioverted at 150J biphasic.  Impression: Findings: Post procedure EKG shows: NSR Complications: None Patient did tolerate procedure well.  Plan: 1. Successful DCCV with a single 150J Biphasic shock.  Time Spent Directly with the Patient:  30 minutes   Pixie Casino, MD, [facility], [place] Director of the Advanced Lipid Disorders &  Cardiovascular Risk Reduction Clinic Diplomate of the American Board of Clinical Lipidology Attending Cardiologist  Direct Dial: [phone]  Fax: [phone]  Website:  [email or web address] 03/15/2019, 9:48 AM

## 2019-03-15 NOTE — Discharge Instructions (Signed)

## 2019-03-15 NOTE — Anesthesia Postprocedure Evaluation (Signed)
Anesthesia Post Note  Patient: Leonard Berg.  Procedure(s) Performed: CARDIOVERSION (N/A )     Patient location during evaluation: Endoscopy Anesthesia Type: General Level of consciousness: awake and alert Pain management: pain level controlled Vital Signs Assessment: post-procedure vital signs reviewed and stable Respiratory status: spontaneous breathing, nonlabored ventilation, respiratory function stable and patient connected to nasal cannula oxygen Cardiovascular status: blood pressure returned to baseline and stable Postop Assessment: no apparent nausea or vomiting Anesthetic complications: no    Last Vitals:  Vitals:   03/15/19 1000 03/15/19 1012  BP: (!) 113/53 (!) 116/54  Pulse: (!) 51 (!) 52  Resp: 16 16  Temp:    SpO2: 100% 100%    Last Pain:  Vitals:   03/15/19 0955  TempSrc: Oral  PainSc: 0-No pain                 Duvan Mousel COKER

## 2019-03-15 NOTE — Anesthesia Preprocedure Evaluation (Signed)
Anesthesia Evaluation  Patient identified by MRN, date of birth, ID band Patient awake    Reviewed: Allergy & Precautions, NPO status , Patient's Chart, lab work & pertinent test results  Airway Mallampati: II  TM Distance: >3 FB     Dental  (+) Dental Advisory Given   Pulmonary former smoker,    breath sounds clear to auscultation       Cardiovascular hypertension,  Rhythm:Irregular Rate:Normal     Neuro/Psych    GI/Hepatic   Endo/Other    Renal/GU      Musculoskeletal   Abdominal   Peds  Hematology   Anesthesia Other Findings   Reproductive/Obstetrics                             Anesthesia Physical Anesthesia Plan  ASA: III  Anesthesia Plan: General   Post-op Pain Management:    Induction: Intravenous  PONV Risk Score and Plan: Propofol infusion  Airway Management Planned: Mask  Additional Equipment:   Intra-op Plan:   Post-operative Plan:   Informed Consent: I have reviewed the patients History and Physical, chart, labs and discussed the procedure including the risks, benefits and alternatives for the proposed anesthesia with the patient or authorized representative who has indicated his/her understanding and acceptance.       Plan Discussed with: Anesthesiologist and CRNA  Anesthesia Plan Comments:         Anesthesia Quick Evaluation

## 2019-03-16 LAB — POCT I-STAT, CHEM 8
BUN: 37 mg/dL — ABNORMAL HIGH (ref 8–23)
Calcium, Ion: 1.02 mmol/L — ABNORMAL LOW (ref 1.15–1.40)
Chloride: 105 mmol/L (ref 98–111)
Creatinine, Ser: 1.8 mg/dL — ABNORMAL HIGH (ref 0.61–1.24)
Glucose, Bld: 86 mg/dL (ref 70–99)
HCT: 42 % (ref 39.0–52.0)
Hemoglobin: 14.3 g/dL (ref 13.0–17.0)
Potassium: 6.6 mmol/L (ref 3.5–5.1)
Sodium: 138 mmol/L (ref 135–145)
TCO2: 30 mmol/L (ref 22–32)

## 2019-03-29 NOTE — Progress Notes (Signed)
Cardiology Office Note    Date:  03/31/2019   ID:  Leonard [name], DOB 1929/12/04, MRN [id number]  PCP:  Leonard Berg  Cardiologist:  Dr. Burt Knack  Chief Complaint: cardioversion  follow up  History of Present Illness:   Leonard [name] is a 85 y.o. male with a hx of PAF, CAD s/p CABG in 2011, PAD with renal artery stenting in 2012, and carotid disease with history of bilateral carotid endarterectomies in 2013, recurrent pulmonary emboli (on long-term oral anticoagulation with rivaroxaban), HLD and thyroid disease seen for follow up.   Seen by Dr. Burt Knack 02/17/2019 for fatigue however found to have new onset afib of unknown duration. Increase Toprol XL to 50mg  qd. Stopped Amlodipine. Echo 02/28/2019 showed LVEF of 30-35% with moderate diastolic dysfunction. Mildly reduce RV function. Normal LA size. Continue to feel poor when seen by me 03/07/2019>> added amiodarone. Underwent successful DCCV with a single 150J Biphasic shock 03/15/2019.  Here today for follow up.  Maintaining sinus rhythm.  Energy level is seen.  He has not noted much difference despite cardioversion.  Some deconditioning.  Denies chest pain, orthopnea, PND, syncope, lower extremity edema or melena.  Compliant with Xarelto.  Past Medical History:  Diagnosis Date  . Anemia   . Anxiety   . Arrhythmia    PAROXYSMAL ATRIAL FIBRILLATION  . Arthritis    OSTEOARTHRITIS  . Cataracts, bilateral   . Chronic back pain   . Coronary artery disease 8/11   CABG...LM EMERGENT WITH IABP sees Dr. Legrand Como cooper  . DJD (degenerative joint disease)   . DVT (deep venous thrombosis) ([place])   . Dyspnea   . GERD (gastroesophageal reflux disease)   . History of BPH   . Hyperlipidemia   . Hypertension   . Hypothyroidism   . Lumbar post-laminectomy syndrome   . Myocardial infarction ([place])   . Pulmonary embolism ([place]) 2009   hx of  . RBBB (right bundle branch block)   . Renal artery stenosis ([place])   .  Thrombocytopenia ([place])   . Thyroid disease    HYPOTHYROIDISM  . Unstable angina ([place])    NONE IN LONG TIME    Past Surgical History:  Procedure Laterality Date  . Blood clot  Feb.20,  2016   Pulmonary Embolism  . CARDIOVERSION N/A 03/15/2019   Procedure: CARDIOVERSION;  Surgeon: Pixie Casino, MD;  Location: [facility] ENDOSCOPY;  Service: Cardiovascular;  Laterality: N/A;  . CAROTID ENDARTERECTOMY Right Oct. 17, 2013   CE  . CAROTID ENDARTERECTOMY Left Dec. 3, 2016   CE  . CATARACT EXTRACTION  05/2014,06/2014  . CORONARY ARTERY BYPASS GRAFT  11-21-09   EMERGENT X 3 GRAFTING. ..[name], MD, cc: [name]  . ENDARTERECTOMY  01/28/2012   Procedure: ENDARTERECTOMY CAROTID;  Surgeon: [name], MD;  Location: [place];  Service: Vascular;  Laterality: Right;  with Primary Closure of Artery  . ENDARTERECTOMY  03/15/2012   Procedure: ENDARTERECTOMY CAROTID;  Surgeon: [name], MD;  Location: [place];  Service: Vascular;  Laterality: Left;  . FOOT SURGERY    . MANDIBLE SURGERY    . MAXIMUM ACCESS (MAS)POSTERIOR LUMBAR INTERBODY FUSION (PLIF) 2 LEVEL N/A 08/08/2015   Procedure: Lumbar Four-Five, Lumbar Five-Sacral One Maximum access posterior lumbar interbody fusion;  Surgeon: [name], MD;  Location: [place] NEURO ORS;  Service: Neurosurgery;  Laterality: N/A;  LUMBAR FOUR-FIVE ,LUMBAR FIVE -SACRAL Maximum access posterior lumbar interbody fusion  . RENAL ARTERY  STENT  2010  . REPLACEMENT TOTAL KNEE  2006   RIGHT  . SPINAL CORD STIMULATOR INSERTION N/A 06/18/2017   Procedure: LUMBAR SPINAL CORD STIMULATOR INSERTION;  Surgeon: [name], MD;  Location: [place];  Service: Neurosurgery;  Laterality: N/A;  LUMBAR SPINAL CORD STIMULATOR INSERTION  . TOTAL HIP ARTHROPLASTY     right    Current Medications: Prior to Admission medications   Medication Sig Start Date End Date Taking? Authorizing Provider  acetaminophen (TYLENOL) 500 MG tablet Take 500 mg by mouth  every 6 (six) hours as needed (for pain.).    [provider]  amiodarone (PACERONE) 200 MG tablet Take 1 tablet by mouth twice daily for 7 days, then take 1 tablet by mouth daily. Patient taking differently: Take 200 mg by mouth See admin instructions. Take 1 tablet by mouth twice daily for 7 days, then take 1 tablet by mouth daily. 03/07/19   Ismail Graziani, Crista Luria, PA  amLODipine (NORVASC) 5 MG tablet Take 5 mg by mouth daily.    [provider]  aspirin EC 81 MG tablet Take 1 tablet (81 mg total) by mouth daily. 03/07/19   [place], Crista Luria, PA  cetirizine (ZYRTEC) 10 MG tablet Take 10 mg by mouth daily as needed for allergies.     [provider]  docusate sodium (COLACE) 100 MG capsule Take 200 mg by mouth daily as needed for mild constipation.    [provider]  furosemide (LASIX) 40 MG tablet Take 1 tablet (40 mg total) by mouth daily. 06/16/17   [name], MD  guaiFENesin (MUCINEX) 600 MG 12 hr tablet Take 600 mg by mouth 2 (two) times daily as needed for cough or to loosen phlegm.    [provider]  levothyroxine (SYNTHROID) 112 MCG tablet TAKE 1 TAB BY MOUTH ONCE DAILY. TAKE ON AN EMPTY STOMACH WITH A GLASSOF WATER ATLEAST 30-60 MINUTES BEFORE BREAKFAST Patient not taking: Reported on 03/07/2019 10/05/18   Leonard Berg  levothyroxine (SYNTHROID) 125 MCG tablet Take 125 mcg by mouth daily before breakfast.    [provider]  metoprolol succinate (TOPROL-XL) 50 MG 24 hr tablet Take 1 tablet (50 mg total) by mouth daily. 02/17/19 02/12/20  [name], MD  mometasone (NASONEX) 50 MCG/ACT nasal spray Place 2 sprays into the nose daily as needed (for allergies).    [provider]  Multiple Vitamin (MULTIVITAMIN WITH MINERALS) TABS tablet Take 1 tablet by mouth daily.    [provider]  rosuvastatin (CRESTOR) 10 MG tablet TAKE 1 TABLET BY MOUTH IN THE EVENING Patient taking differently: Take 10 mg by mouth  every evening.  11/18/18   Leonard Berg  tamsulosin (FLOMAX) 0.4 MG CAPS capsule TAKE 1 CAPSULE BY MOUTH ONCE DAILY Patient taking differently: Take 0.4 mg by mouth every evening.  12/17/18   Leonard Berg  triamcinolone cream (KENALOG) 0.1 % Apply 1 application topically 2 (two) times daily as needed (affected area(s) of ankle.).    [provider]  XARELTO 20 MG TABS tablet Take 1 tablet (20 mg total) by mouth daily with supper. 12/20/18   [name], MD    Allergies:   Patient has no known allergies.   Social History   Socioeconomic History  . Marital status: Married    Spouse name: Not on file  . Number of children: 2  . Years of education: Not on file  . Highest education level: Not on file  Occupational History  .  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 Smoking status: Former Smoker    Types: Cigarettes    Quit date: 04/13/1974    Years since quitting: 44.9  . Smokeless tobacco: Former Systems developer    Types: Chew    Quit date: 01/26/2002  . Tobacco comment: He has smoked about 27-pack-year hx   Substance and Sexual Activity  . Alcohol use: Yes    Alcohol/week: 2.0 standard drinks    Types: 2 Cans of beer per week    Comment: He used to drink more heavily , but rarely drinks at the current time  . Drug use: No  . Sexual activity: Not on file  Other Topics Concern  . Not on file  Social History Narrative   LIVES IN [place] WITH HIS WIFE.   HE HAS 2 GROWN CHILDREN   HE IS RETIRED PRISON FIRM SUPERINTENDENT.   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   HE GOES TO THE [employer] 3 X WEEKLY.   Social Determinants of Health   Financial Resource Strain:   . Difficulty of Paying Living Expenses: Not on file  Food Insecurity:   . Worried About Charity fundraiser in the Last Year: Not on file  . Ran Out of Food in the Last Year: Not on file    Transportation Needs:   . Lack of Transportation (Medical): Not on file  . Lack of Transportation (Non-Medical): Not on file  Physical Activity:   . Days of Exercise per Week: Not on file  . Minutes of Exercise per Session: Not on file  Stress:   . Feeling of Stress : Not on file  Social Connections:   . Frequency of Communication with Friends and Family: Not on file  . Frequency of Social Gatherings with Friends and Family: Not on file  . Attends Religious Services: Not on file  . Active Member of Clubs or Organizations: Not on file  . Attends Archivist Meetings: Not on file  . Marital Status: Not on file     Family History:  The patient's family history includes Cancer in his sister; Cancer (age of onset: 108) in his father; Diabetes in his brother; Heart attack (age of onset: 44) in his brother; Hyperlipidemia in his mother; Liver cancer (age of onset: 50) in his sister; Lung cancer (age of onset: 79) in his father; Stroke (age of onset: 5) in his mother.   ROS:   Please see the history of present illness.    ROS All other systems reviewed and are negative.   PHYSICAL EXAM:   VS:  BP (!) 122/52   Pulse (!) 54   Ht 6\' 2"  (1.88 m)   Wt 241 lb 6.4 oz (109.5 kg)   SpO2 98%   BMI 30.99 kg/m    GEN: Well nourished, well developed, in no acute distress  HEENT: normal  Neck: no JVD, carotid bruits, or masses Cardiac: RRR; no murmurs, rubs, or gallops, LLE edema (mild) with venous stasis  Respiratory:  clear to auscultation bilaterally, normal work of breathing GI: soft, nontender, nondistended, + BS MS: no deformity or atrophy  Skin: warm and dry, no rash Neuro:  Alert and Oriented x 3, Strength and sensation are intact Psych: euthymic mood, full affect  Wt Readings from Last 3 Encounters:  03/31/19 241 lb 6.4 oz (109.5  kg)  03/15/19 237 lb (107.5 kg)  03/07/19 238 lb 12.8 oz (108.3 kg)      Studies/Labs Reviewed:   EKG:  EKG is ordered today.  The ekg  ordered today demonstrates sinus bradycardia at rate of 54 bpm, right bundle branch block  Recent Labs: 04/19/2018: ALT 14 08/03/2018: TSH 0.56 03/07/2019: Platelets 187 03/15/2019: BUN 25; Creatinine, Ser 1.80; Hemoglobin 14.6; Potassium 3.4; Sodium 142   Lipid Panel    Component Value Date/Time   CHOL 143 04/19/2018 1038   TRIG 139.0 04/19/2018 1038   HDL 37.50 (L) 04/19/2018 1038   CHOLHDL 4 04/19/2018 1038   VLDL 27.8 04/19/2018 1038   LDLCALC 78 04/19/2018 1038   LDLDIRECT 100.8 08/12/2010 1523    Additional studies/ records that were reviewed today include:   Echocardiogram: 02/28/2019 1. Left ventricular ejection fraction, by visual estimation, is 30 to 35%. The left ventricle has moderately decreased function. There is mildly increased left ventricular hypertrophy. Global hypokinesis.  2. Left ventricular diastolic parameters are indeterminate.  3. Global right ventricle has mildly reduced systolic function.The right ventricular size is mildly enlarged.  4. Left atrial size was normal.  5. Right atrial size was mildly dilated.  6. The mitral valve is normal in structure. Trace mitral valve regurgitation.  7. The tricuspid valve is normal in structure. Tricuspid valve regurgitation is trivial.  8. The aortic valve is tricuspid. Aortic valve regurgitation is not visualized. Mild aortic valve sclerosis without stenosis.  9. The pulmonic valve was not well visualized. Pulmonic valve regurgitation is not visualized. 10. There is dilatation of the ascending aorta measuring 39 mm.     ASSESSMENT & PLAN:    1. Paroxysmal atrial fibrillation with slow ventricular rate - S/p succesful cardioversion 12/1.  Maintaining sinus rhythm at rate of 50.  He has not noted much difference after cardioversion.  This could be due to slow heart rate.  Also deconditioning.  Will reduce Toprol-XL to 25 mg daily.  Continue amiodarone 200 mg daily.  Compliant with Gwenyth Bou[facility]. No bleeding issue.    2. Chronic combined CHF -Euvolemic.  Likely due to unknown duration of afib. Echo 02/28/2019 showed LVEF of 30-35% with moderate diastolic dysfunction. Mildly reduce RV function. Normal LA size. If no improvement of his symptoms after restoration of sinus rhythm >> may consider ischemic evaluation.  -Reduce beta-blocker as above.  Add lisinopril. BMET in 2 weeks.  - Continue Lasix.   3. HTN -Medication changes as above. -Reduce Toprol-XL to 25 mg, add lisinopril 5 mg.  Stop amlodipine  4. CAD - No chest pain. Continue ASA, statin and BB.   5.  Recurrent pulmonary emboli (on long-term oral anticoagulation with rivaroxaban) - compliant     Medication Adjustments/Labs and Tests Ordered: Current medicines are reviewed at length with the patient today.  Concerns regarding medicines are outlined above.  Medication changes, Labs and Tests ordered today are listed in the Patient Instructions below. Patient Instructions  Medication Instructions:  Your physician has recommended you make the following change in your medication:  1.  STOP the Amlodipine 2.  START Lisinopril 5mg  daily in the mornings 3.  REDUCE the Metoprolol to 25 mg daily ( a new prescription has been sent to your pharmacy)  *If you need a refill on your cardiac medications before your next appointment, please call your pharmacy*  Lab Work: 04/13/19:  Come to the office on that morning for BMET  If you have labs (blood work) drawn today and your  tests are completely normal, you will receive your results only by: Marland Kitchen MyChart Message (if you have MyChart) OR . A paper copy in the mail If you have any lab test that is abnormal or we need to change your treatment, we will call you to review the results.  Testing/Procedures: None ordered  Follow-Up: At [facility], you and your health needs are our priority.  As part of our continuing mission to provide you with exceptional heart care, we have created designated  Provider Care Teams.  These Care Teams include your primary Cardiologist (physician) and Advanced Practice Providers (APPs -  Physician Assistants and Nurse Practitioners) who all work together to provide you with the care you need, when you need it.  Your next appointment:   You are scheduled for a VIRTUAL follow-up appointment with Robbie Lis, PA-C, 05/03/2019 at 9:30.  Have your blood pressure and heart rate checked 15 minutes prior to your appointment time,someone from our office will call you to get this information from you.     Jarrett Soho, [place]  03/31/2019 11:11 AM    [address], [place], [place]  [address] Phone: [phone]; Fax: [phone]

## 2019-03-31 ENCOUNTER — Encounter: Payer: Self-pay | Admitting: Physician Assistant

## 2019-03-31 ENCOUNTER — Ambulatory Visit: Payer: Medicare Other | Admitting: Physician Assistant

## 2019-03-31 ENCOUNTER — Other Ambulatory Visit: Payer: Self-pay

## 2019-03-31 VITALS — BP 122/52 | HR 54 | Ht 74.0 in | Wt 241.4 lb

## 2019-03-31 DIAGNOSIS — I2510 Atherosclerotic heart disease of native coronary artery without angina pectoris: Secondary | ICD-10-CM

## 2019-03-31 DIAGNOSIS — E782 Mixed hyperlipidemia: Secondary | ICD-10-CM

## 2019-03-31 DIAGNOSIS — I4891 Unspecified atrial fibrillation: Secondary | ICD-10-CM | POA: Diagnosis not present

## 2019-03-31 DIAGNOSIS — I480 Paroxysmal atrial fibrillation: Secondary | ICD-10-CM

## 2019-03-31 DIAGNOSIS — I10 Essential (primary) hypertension: Secondary | ICD-10-CM

## 2019-03-31 MED ORDER — METOPROLOL SUCCINATE ER 25 MG PO TB24: 25.0000 mg | ORAL_TABLET | Freq: Every day | ORAL | 3 refills | Status: DC

## 2019-03-31 MED ORDER — LISINOPRIL 5 MG PO TABS: 5.0000 mg | ORAL_TABLET | Freq: Every day | ORAL | 3 refills | Status: DC

## 2019-03-31 NOTE — Patient Instructions (Addendum)
Medication Instructions:  Your physician has recommended you make the following change in your medication:  1.  STOP the Amlodipine 2.  START Lisinopril 5mg  daily in the mornings 3.  REDUCE the Metoprolol to 25 mg daily ( a new prescription has been sent to your pharmacy)  *If you need a refill on your cardiac medications before your next appointment, please call your pharmacy*  Lab Work: 04/13/19:  Come to the office on that morning for BMET  If you have labs (blood work) drawn today and your tests are completely normal, you will receive your results only by: Marland Kitchen MyChart Message (if you have MyChart) OR . A paper copy in the mail If you have any lab test that is abnormal or we need to change your treatment, we will call you to review the results.  Testing/Procedures: None ordered  Follow-Up: At [facility], you and your health needs are our priority.  As part of our continuing mission to provide you with exceptional heart care, we have created designated Provider Care Teams.  These Care Teams include your primary Cardiologist (physician) and Advanced Practice Providers (APPs -  Physician Assistants and Nurse Practitioners) who all work together to provide you with the care you need, when you need it.  Your next appointment:   You are scheduled for a VIRTUAL follow-up appointment with Robbie Lis, PA-C, 05/03/2019 at 9:30.  Have your blood pressure and heart rate checked 15 minutes prior to your appointment time,someone from our office will call you to get this information from you.    YOUR CARDIOLOGY TEAM HAS ARRANGED FOR AN E-VISIT FOR YOUR APPOINTMENT - PLEASE REVIEW IMPORTANT INFORMATION BELOW SEVERAL DAYS PRIOR TO YOUR APPOINTMENT  Due to the recent COVID-19 pandemic, we are transitioning in-person office visits to tele-medicine visits in an effort to decrease unnecessary exposure to our patients, their families, and staff. These visits are billed to your insurance just like a normal  visit is. We also encourage you to sign up for MyChart if you have not already done so. You will need a smartphone if possible. For patients that do not have this, we can still complete the visit using a regular telephone but do prefer a smartphone to enable video when possible. You may have a family member that lives with you that can help. If possible, we also ask that you have a blood pressure cuff and scale at home to measure your blood pressure, heart rate and weight prior to your scheduled appointment. Patients with clinical needs that need an in-person evaluation and testing will still be able to come to the office if absolutely necessary. If you have any questions, feel free to call our office.     YOUR PROVIDER WILL BE USING THE FOLLOWING PLATFORM TO COMPLETE YOUR VISIT:/[email or web address]  . IF USING DOXIMITY or [email or web address] - The staff will give you instructions on receiving your link to join the meeting the day of your visit.   THE DAY OF YOUR APPOINTMENT  Approximately 15 minutes prior to your scheduled appointment, you will receive a telephone call from one of [facility] team - your caller ID may say "Unknown caller."  Our staff will confirm medications, vital signs for the day and any symptoms you may be experiencing. Please have this information available prior to the time of visit start. It may also be helpful for you to have a pad of paper and pen handy for any instructions given during your visit. They will also walk you through  joining the smartphone meeting if this is a video visit.    CONSENT FOR TELE-HEALTH VISIT - PLEASE REVIEW  I hereby voluntarily request, consent and authorize [facility] and its employed or contracted physicians, physician assistants, nurse practitioners or other licensed health care professionals (the Practitioner), to provide me with telemedicine health care services (the "Services") as deemed necessary by the treating Practitioner. I acknowledge and consent to  receive the Services by the Practitioner via telemedicine. I understand that the telemedicine visit will involve communicating with the Practitioner through live audiovisual communication technology and the disclosure of certain medical information by electronic transmission. I acknowledge that I have been given the opportunity to request an in-person assessment or other available alternative prior to the telemedicine visit and am voluntarily participating in the telemedicine visit.  I understand that I have the right to withhold or withdraw my consent to the use of telemedicine in the course of my care at any time, without affecting my right to future care or treatment, and that the Practitioner or I may terminate the telemedicine visit at any time. I understand that I have the right to inspect all information obtained and/or recorded in the course of the telemedicine visit and may receive copies of available information for a reasonable fee.  I understand that some of the potential risks of receiving the Services via telemedicine include:  Marland Kitchen Delay or interruption in medical evaluation due to technological equipment failure or disruption; . Information transmitted may not be sufficient (e.g. poor resolution of images) to allow for appropriate medical decision making by the Practitioner; and/or  . In rare instances, security protocols could fail, causing a breach of personal health information.  Furthermore, I acknowledge that it is my responsibility to provide information about my medical history, conditions and care that is complete and accurate to the best of my ability. I acknowledge that Practitioner's advice, recommendations, and/or decision may be based on factors not within their control, such as incomplete or inaccurate data provided by me or distortions of diagnostic images or specimens that may result from electronic transmissions. I understand that the practice of medicine is not an exact science  and that Practitioner makes no warranties or guarantees regarding treatment outcomes. I acknowledge that I will receive a copy of this consent concurrently upon execution via email to the email address I last provided but may also request a printed copy by calling the office of [place].    I understand that my insurance will be billed for this visit.   I have read or had this consent read to me. . I understand the contents of this consent, which adequately explains the benefits and risks of the Services being provided via telemedicine.  . I have been provided ample opportunity to ask questions regarding this consent and the Services and have had my questions answered to my satisfaction. . I give my informed consent for the services to be provided through the use of telemedicine in my medical care  By participating in this telemedicine visit I agree to the above.

## 2019-04-13 ENCOUNTER — Other Ambulatory Visit: Payer: Medicare Other | Admitting: *Deleted

## 2019-04-13 ENCOUNTER — Other Ambulatory Visit: Payer: Self-pay

## 2019-04-13 DIAGNOSIS — I4891 Unspecified atrial fibrillation: Secondary | ICD-10-CM

## 2019-04-13 DIAGNOSIS — I10 Essential (primary) hypertension: Secondary | ICD-10-CM

## 2019-04-13 DIAGNOSIS — I2510 Atherosclerotic heart disease of native coronary artery without angina pectoris: Secondary | ICD-10-CM

## 2019-04-13 LAB — BASIC METABOLIC PANEL
BUN/Creatinine Ratio: 16 (ref 10–24)
BUN: 28 mg/dL — ABNORMAL HIGH (ref 8–27)
CO2: 25 mmol/L (ref 20–29)
Calcium: 9.1 mg/dL (ref 8.6–10.2)
Chloride: 101 mmol/L (ref 96–106)
Creatinine, Ser: 1.77 mg/dL — ABNORMAL HIGH (ref 0.76–1.27)
GFR calc Af Amer: 39 mL/min/{1.73_m2} — ABNORMAL LOW (ref >59)
GFR calc non Af Amer: 34 mL/min/{1.73_m2} — ABNORMAL LOW (ref >59)
Glucose: 127 mg/dL — ABNORMAL HIGH (ref 65–99)
Potassium: 3.7 mmol/L (ref 3.5–5.2)
Sodium: 140 mmol/L (ref 134–144)

## 2019-04-18 ENCOUNTER — Other Ambulatory Visit: Payer: Self-pay | Admitting: Primary Care

## 2019-04-18 DIAGNOSIS — E785 Hyperlipidemia, unspecified: Secondary | ICD-10-CM

## 2019-05-02 NOTE — Progress Notes (Signed)
Virtual Visit via Telephone Note   This visit type was conducted due to national recommendations for restrictions regarding the COVID-19 Pandemic (e.g. social distancing) in an effort to limit this patient's exposure and mitigate transmission in our community.  Due to his co-morbid illnesses, this patient is at least at moderate risk for complications without adequate follow up.  This format is felt to be most appropriate for this patient at this time.  The patient did not have access to video technology/had technical difficulties with video requiring transitioning to audio format only (telephone).  All issues noted in this document were discussed and addressed.  No physical exam could be performed with this format.  Please refer to the patient's chart for his  consent to telehealth for [facility].   Date:  05/03/2019   ID:  Leonard [name], DOB September 22, 1929, MRN [id number]  Patient Location: Home Provider Location: Home  PCP:  [name], NP  Cardiologist:  [name], MD  Electrophysiologist:  None   Evaluation Performed:  Follow-Up Visit  Chief Complaint: one month follow up for high blood pressure   History of Present Illness:    Leonard CHAVOYA Sr. is a 86 y.o. male with hx ofPAF, CAD s/pCABG in 2011,PADwith renal artery stenting in 2012, and carotid disease with history of bilateral carotid endarterectomies in 2013,recurrent pulmonary emboli (on long-term oral anticoagulation with rivaroxaban), HLD and thyroid disease seen for follow up.   Seen by Dr. Burt Knack 02/17/2019 for fatigue however found to have new onset afib of unknown duration. Increase Toprol XL to 50mg  qd. Stopped Amlodipine. Echo 02/28/2019 showed LVEF of 30-35% with moderate diastolic dysfunction. Mildly reduce RV function. Normal LA size. Continue to feel poor when seen by me 03/07/2019>> added amiodarone. Underwent successful DCCV with a single 150J Biphasic shock 03/15/2019. Noted  bradycardic and fatigued during my evaluation 03/31/19. Reduced Toprol XL to 25mg  qd. Added lisinopril given low EF.   Seen today for follow up. Patient is hard of hearing. Also spoke with wife. Energy level is same. However he does not do any activity. He just sit on chair and watch TV. No chest pain, dyspnea, orthopnea, PND or syncope.   The patient does not have symptoms concerning for COVID-19 infection (fever, chills, cough, or new shortness of breath).    Past Medical History:  Diagnosis Date   Anemia    Anxiety    Arrhythmia    PAROXYSMAL ATRIAL FIBRILLATION   Arthritis    OSTEOARTHRITIS   Cataracts, bilateral    Chronic back pain    Coronary artery disease 8/11   CABG...[facility] EMERGENT WITH IABP sees Dr. Legrand Como cooper   DJD (degenerative joint disease)    DVT (deep venous thrombosis) (HCC)    Dyspnea    GERD (gastroesophageal reflux disease)    History of BPH    Hyperlipidemia    Hypertension    Hypothyroidism    Lumbar post-laminectomy syndrome    Myocardial infarction [facility])    Pulmonary embolism ([name]) 2009   hx of   RBBB (right bundle branch block)    Renal artery stenosis (HCC)    Thrombocytopenia (HCC)    Thyroid disease    HYPOTHYROIDISM   Unstable angina ([place])    NONE IN LONG TIME   Past Surgical History:  Procedure Laterality Date   Blood clot  Feb.20,  2016   Pulmonary Embolism   CARDIOVERSION N/A 03/15/2019   Procedure: CARDIOVERSION;  Surgeon: Pixie Casino, MD;  [facility]: [facility];  Service: Cardiovascular;  Laterality: N/A;   CAROTID ENDARTERECTOMY Right Oct. 17, 2013   CE   CAROTID ENDARTERECTOMY Left Dec. 3, 2016   CE   CATARACT EXTRACTION  05/2014,06/2014   CORONARY ARTERY BYPASS GRAFT  11-21-09   EMERGENT X 3 GRAFTING. ..[name], MD, cc: [name]   ENDARTERECTOMY  01/28/2012   Procedure: ENDARTERECTOMY CAROTID;  Surgeon: [name], MD;  Location: [place];  Service: Vascular;   Laterality: Right;  with Primary Closure of Artery   ENDARTERECTOMY  03/15/2012   Procedure: ENDARTERECTOMY CAROTID;  Surgeon: [name], MD;  Location: [place];  Service: Vascular;  Laterality: Left;   FOOT SURGERY     MANDIBLE SURGERY     MAXIMUM ACCESS (MAS)POSTERIOR LUMBAR INTERBODY FUSION (PLIF) 2 LEVEL N/A 08/08/2015   Procedure: Lumbar Four-Five, Lumbar Five-Sacral One Maximum access posterior lumbar interbody fusion;  Surgeon: [name], MD;  Location: [place] [facility];  Service: Neurosurgery;  Laterality: N/A;  LUMBAR FOUR-FIVE ,LUMBAR FIVE -SACRAL Maximum access posterior lumbar interbody fusion   RENAL ARTERY STENT  2010   REPLACEMENT TOTAL KNEE  2006   RIGHT   SPINAL CORD STIMULATOR INSERTION N/A 06/18/2017   Procedure: LUMBAR SPINAL CORD STIMULATOR INSERTION;  Surgeon: [name], MD;  Location: [place];  Service: Neurosurgery;  Laterality: N/A;  LUMBAR SPINAL CORD STIMULATOR INSERTION   TOTAL HIP ARTHROPLASTY     right     Current Meds  Medication Sig   acetaminophen (TYLENOL) 500 MG tablet Take 500 mg by mouth every 6 (six) hours as needed (for pain.).   amiodarone (PACERONE) 200 MG tablet Take 1 tablet by mouth twice daily for 7 days, then take 1 tablet by mouth daily.   AMITIZA 24 MCG capsule Take 24 mcg by mouth 2 (two) times daily.   aspirin EC 81 MG tablet Take 1 tablet (81 mg total) by mouth daily.   cetirizine (ZYRTEC) 10 MG tablet Take 10 mg by mouth daily as needed for allergies.    docusate sodium (COLACE) 100 MG capsule Take 200 mg by mouth daily as needed for mild constipation.   furosemide (LASIX) 40 MG tablet Take 1 tablet (40 mg total) by mouth daily.   guaiFENesin (MUCINEX) 600 MG 12 hr tablet Take 600 mg by mouth 2 (two) times daily as needed for cough or to loosen phlegm.   levothyroxine (SYNTHROID) 125 MCG tablet Take 125 mcg by mouth daily before breakfast.   lisinopril (ZESTRIL) 5 MG tablet Take 1 tablet (5 mg total) by mouth  daily.   metoprolol succinate (TOPROL XL) 25 MG 24 hr tablet Take 1 tablet (25 mg total) by mouth daily.   mometasone (NASONEX) 50 MCG/ACT nasal spray Place 2 sprays into the nose daily as needed (for allergies).   Multiple Vitamin (MULTIVITAMIN WITH MINERALS) TABS tablet Take 1 tablet by mouth daily.   rosuvastatin (CRESTOR) 10 MG tablet TAKE ONE TABLET BY MOUTH IN THE EVENING   tamsulosin (FLOMAX) 0.4 MG CAPS capsule TAKE 1 CAPSULE BY MOUTH ONCE DAILY   triamcinolone cream (KENALOG) 0.1 % Apply 1 application topically 2 (two) times daily as needed (affected area(s) of ankle.).   XARELTO 20 MG TABS tablet Take 1 tablet (20 mg total) by mouth daily with supper.     Allergies:   Patient has no known allergies.   Social History   Tobacco Use   Smoking status: Former Smoker    Types: Cigarettes  Quit date: 04/13/1974    Years since quitting: 45.0   Smokeless tobacco: Former Systems developer    Types: Chew    Quit date: 01/26/2002   Tobacco comment: He has smoked about 27-pack-year hx   Substance Use Topics   Alcohol use: Yes    Alcohol/week: 2.0 standard drinks    Types: 2 Cans of beer per week    Comment: He used to drink more heavily , but rarely drinks at the current time   Drug use: No     Family Hx: The patient's family history includes Cancer in his sister; Cancer (age of onset: 67) in his father; Diabetes in his brother; Heart attack (age of onset: 58) in his brother; Hyperlipidemia in his mother; Liver cancer (age of onset: 67) in his sister; Lung cancer (age of onset: 9) in his father; Stroke (age of onset: 53) in his mother.  ROS:   Please see the history of present illness.    All other systems reviewed and are negative.   Prior CV studies:   The following studies were reviewed today:  Echocardiogram: 02/28/2019 1. Left ventricular ejection fraction, by visual estimation, is 30 to 35%. The left ventricle has moderately decreased function. There is mildly increased  left ventricular hypertrophy. Global hypokinesis. 2. Left ventricular diastolic parameters are indeterminate. 3. Global right ventricle has mildly reduced systolic function.The right ventricular size is mildly enlarged. 4. Left atrial size was normal. 5. Right atrial size was mildly dilated. 6. The mitral valve is normal in structure. Trace mitral valve regurgitation. 7. The tricuspid valve is normal in structure. Tricuspid valve regurgitation is trivial. 8. The aortic valve is tricuspid. Aortic valve regurgitation is not visualized. Mild aortic valve sclerosis without stenosis. 9. The pulmonic valve was not well visualized. Pulmonic valve regurgitation is not visualized. 10. There is dilatation of the ascending aorta measuring 39 mm.  Labs/Other Tests and Data Reviewed:    EKG:  No ECG reviewed.  Recent Labs: 08/03/2018: TSH 0.56 03/07/2019: Platelets 187 03/15/2019: Hemoglobin 14.6 04/13/2019: BUN 28; Creatinine, Ser 1.77; Potassium 3.7; Sodium 140   Recent Lipid Panel Lab Results  Component Value Date/Time   CHOL 143 04/19/2018 10:38 AM   TRIG 139.0 04/19/2018 10:38 AM   HDL 37.50 (L) 04/19/2018 10:38 AM   CHOLHDL 4 04/19/2018 10:38 AM   LDLCALC 78 04/19/2018 10:38 AM   LDLDIRECT 100.8 08/12/2010 03:23 PM    Wt Readings from Last 3 Encounters:  05/03/19 241 lb 6.4 oz (109.5 kg)  03/31/19 241 lb 6.4 oz (109.5 kg)  03/15/19 237 lb (107.5 kg)     Objective:    Vital Signs:  Ht 6\' 2"  (1.88 m)    Wt 241 lb 6.4 oz (109.5 kg)    BMI 30.99 kg/m  BP 120/59 P 64  VITAL SIGNS:  reviewed GEN:  no acute distress PSYCH:  normal affect  ASSESSMENT & PLAN:     1. Paroxysmal atrial fibrillation  - S/p succesful cardioversion 12/1.  Maintaining sinus rhythm during last office visit but reduce Toprol-XL to 25 mg daily due to slow heart rate. Continue amiodarone 200 mg daily.  Compliant with [facility]. No bleeding issue.   2. Chronic combined CHF - Echo 02/28/2019 showed  LVEF of 30-35% with moderate diastolic dysfunction. Mildly reduce RV function. Normal LA size.  Likely his low EF due to unknown duration of afib. No CHF symptoms. Continue BB and Lisinopril.  3. HTN -Stable on current medications  4. CAD - No chest  pain. Continue ASA, statin and BB.  5. Recurrent pulmonary emboli(on long-term oral anticoagulation with rivaroxaban) - compliant   6. Fatigue - likely due to deconditioning. Advised to get some exercise.     COVID-19 Education: The signs and symptoms of COVID-19 were discussed with the patient and how to seek care for testing (follow up with PCP or arrange E-visit). The importance of social distancing was discussed today.  Time:   Today, I have spent 7 minutes with the patient with telehealth technology discussing the above problems.     Medication Adjustments/Labs and Tests Ordered: Current medicines are reviewed at length with the patient today.  Concerns regarding medicines are outlined above.   Tests Ordered: No orders of the defined types were placed in this encounter.   Medication Changes: No orders of the defined types were placed in this encounter.   Follow Up:  Either In Person or Virtual in 4 month(s)  Signed, Leanor Kail, [place]  05/03/2019 10:36 AM    [place]

## 2019-05-03 ENCOUNTER — Telehealth (INDEPENDENT_AMBULATORY_CARE_PROVIDER_SITE_OTHER): Payer: Medicare Other | Admitting: Physician Assistant

## 2019-05-03 ENCOUNTER — Encounter: Payer: Self-pay | Admitting: Physician Assistant

## 2019-05-03 ENCOUNTER — Other Ambulatory Visit: Payer: Self-pay

## 2019-05-03 VITALS — Ht 74.0 in | Wt 241.4 lb

## 2019-05-03 DIAGNOSIS — I5042 Chronic combined systolic (congestive) and diastolic (congestive) heart failure: Secondary | ICD-10-CM | POA: Diagnosis not present

## 2019-05-03 DIAGNOSIS — I10 Essential (primary) hypertension: Secondary | ICD-10-CM

## 2019-05-03 DIAGNOSIS — E782 Mixed hyperlipidemia: Secondary | ICD-10-CM | POA: Diagnosis not present

## 2019-05-03 DIAGNOSIS — I480 Paroxysmal atrial fibrillation: Secondary | ICD-10-CM | POA: Diagnosis not present

## 2019-05-03 DIAGNOSIS — I2510 Atherosclerotic heart disease of native coronary artery without angina pectoris: Secondary | ICD-10-CM

## 2019-05-03 DIAGNOSIS — I110 Hypertensive heart disease with heart failure: Secondary | ICD-10-CM

## 2019-05-03 NOTE — Patient Instructions (Addendum)
Medication Instructions:  Your physician recommends that you continue on your current medications as directed. Please refer to the Current Medication list given to you today.  *If you need a refill on your cardiac medications before your next appointment, please call your pharmacy*  Lab Work: None ordered  If you have labs (blood work) drawn today and your tests are completely normal, you will receive your results only by: Marland Kitchen MyChart Message (if you have MyChart) OR . A paper copy in the mail If you have any lab test that is abnormal or we need to change your treatment, we will call you to review the results.  Testing/Procedures: None ordered  Follow-Up: At [facility], you and your health needs are our priority.  As part of our continuing mission to provide you with exceptional heart care, we have created designated Provider Care Teams.  These Care Teams include your primary Cardiologist (physician) and Advanced Practice Providers (APPs -  Physician Assistants and Nurse Practitioners) who all work together to provide you with the care you need, when you need it.  Your next appointment:   4 month(s)  The format for your next appointment:   In Person  Provider:   You may see [name], MD or one of the following Advanced Practice Providers on your designated Care Team:    Richardson Dopp, PA-C  [place], [place]  Daune Perch, [place]

## 2019-05-14 ENCOUNTER — Ambulatory Visit: Payer: Medicare Other

## 2019-05-18 ENCOUNTER — Other Ambulatory Visit: Payer: Self-pay

## 2019-05-18 ENCOUNTER — Emergency Department (HOSPITAL_COMMUNITY): Payer: Medicare Other

## 2019-05-18 ENCOUNTER — Encounter (HOSPITAL_COMMUNITY): Payer: Self-pay | Admitting: Emergency Medicine

## 2019-05-18 ENCOUNTER — Inpatient Hospital Stay (HOSPITAL_COMMUNITY)
Admission: EM | Admit: 2019-05-18 | Discharge: 2019-05-25 | DRG: 378 | Disposition: A | Discharge status: Home Health | Payer: Medicare Other | Attending: Internal Medicine | Admitting: Internal Medicine

## 2019-05-18 DIAGNOSIS — I951 Orthostatic hypotension: Secondary | ICD-10-CM | POA: Diagnosis not present

## 2019-05-18 DIAGNOSIS — E785 Hyperlipidemia, unspecified: Secondary | ICD-10-CM | POA: Diagnosis present

## 2019-05-18 DIAGNOSIS — Z66 Do not resuscitate: Secondary | ICD-10-CM | POA: Diagnosis present

## 2019-05-18 DIAGNOSIS — K921 Melena: Secondary | ICD-10-CM | POA: Diagnosis not present

## 2019-05-18 DIAGNOSIS — K922 Gastrointestinal hemorrhage, unspecified: Secondary | ICD-10-CM

## 2019-05-18 DIAGNOSIS — E669 Obesity, unspecified: Secondary | ICD-10-CM | POA: Diagnosis present

## 2019-05-18 DIAGNOSIS — E039 Hypothyroidism, unspecified: Secondary | ICD-10-CM | POA: Diagnosis present

## 2019-05-18 DIAGNOSIS — R531 Weakness: Secondary | ICD-10-CM | POA: Diagnosis not present

## 2019-05-18 DIAGNOSIS — D123 Benign neoplasm of transverse colon: Secondary | ICD-10-CM | POA: Diagnosis not present

## 2019-05-18 DIAGNOSIS — R195 Other fecal abnormalities: Secondary | ICD-10-CM | POA: Diagnosis not present

## 2019-05-18 DIAGNOSIS — I5022 Chronic systolic (congestive) heart failure: Secondary | ICD-10-CM | POA: Diagnosis not present

## 2019-05-18 DIAGNOSIS — I739 Peripheral vascular disease, unspecified: Secondary | ICD-10-CM | POA: Diagnosis not present

## 2019-05-18 DIAGNOSIS — Z7982 Long term (current) use of aspirin: Secondary | ICD-10-CM

## 2019-05-18 DIAGNOSIS — Z96641 Presence of right artificial hip joint: Secondary | ICD-10-CM | POA: Diagnosis present

## 2019-05-18 DIAGNOSIS — K219 Gastro-esophageal reflux disease without esophagitis: Secondary | ICD-10-CM | POA: Diagnosis present

## 2019-05-18 DIAGNOSIS — Z6833 Body mass index (BMI) 33.0-33.9, adult: Secondary | ICD-10-CM

## 2019-05-18 DIAGNOSIS — K5909 Other constipation: Secondary | ICD-10-CM | POA: Diagnosis present

## 2019-05-18 DIAGNOSIS — K641 Second degree hemorrhoids: Secondary | ICD-10-CM | POA: Diagnosis present

## 2019-05-18 DIAGNOSIS — N400 Enlarged prostate without lower urinary tract symptoms: Secondary | ICD-10-CM | POA: Diagnosis present

## 2019-05-18 DIAGNOSIS — I4510 Unspecified right bundle-branch block: Secondary | ICD-10-CM | POA: Diagnosis present

## 2019-05-18 DIAGNOSIS — M549 Dorsalgia, unspecified: Secondary | ICD-10-CM | POA: Diagnosis present

## 2019-05-18 DIAGNOSIS — D62 Acute posthemorrhagic anemia: Secondary | ICD-10-CM | POA: Diagnosis not present

## 2019-05-18 DIAGNOSIS — Z96651 Presence of right artificial knee joint: Secondary | ICD-10-CM | POA: Diagnosis present

## 2019-05-18 DIAGNOSIS — K449 Diaphragmatic hernia without obstruction or gangrene: Secondary | ICD-10-CM | POA: Diagnosis present

## 2019-05-18 DIAGNOSIS — F419 Anxiety disorder, unspecified: Secondary | ICD-10-CM | POA: Diagnosis present

## 2019-05-18 DIAGNOSIS — Z79890 Hormone replacement therapy (postmenopausal): Secondary | ICD-10-CM

## 2019-05-18 DIAGNOSIS — Z7901 Long term (current) use of anticoagulants: Secondary | ICD-10-CM

## 2019-05-18 DIAGNOSIS — R5383 Other fatigue: Secondary | ICD-10-CM | POA: Diagnosis not present

## 2019-05-18 DIAGNOSIS — Z79899 Other long term (current) drug therapy: Secondary | ICD-10-CM

## 2019-05-18 DIAGNOSIS — D127 Benign neoplasm of rectosigmoid junction: Secondary | ICD-10-CM | POA: Diagnosis not present

## 2019-05-18 DIAGNOSIS — K5520 Angiodysplasia of colon without hemorrhage: Secondary | ICD-10-CM | POA: Diagnosis not present

## 2019-05-18 DIAGNOSIS — Z8249 Family history of ischemic heart disease and other diseases of the circulatory system: Secondary | ICD-10-CM

## 2019-05-18 DIAGNOSIS — D120 Benign neoplasm of cecum: Secondary | ICD-10-CM | POA: Diagnosis present

## 2019-05-18 DIAGNOSIS — I480 Paroxysmal atrial fibrillation: Secondary | ICD-10-CM | POA: Diagnosis not present

## 2019-05-18 DIAGNOSIS — K5521 Angiodysplasia of colon with hemorrhage: Principal | ICD-10-CM | POA: Diagnosis present

## 2019-05-18 DIAGNOSIS — Z8349 Family history of other endocrine, nutritional and metabolic diseases: Secondary | ICD-10-CM

## 2019-05-18 DIAGNOSIS — I110 Hypertensive heart disease with heart failure: Secondary | ICD-10-CM | POA: Diagnosis present

## 2019-05-18 DIAGNOSIS — K5730 Diverticulosis of large intestine without perforation or abscess without bleeding: Secondary | ICD-10-CM | POA: Diagnosis not present

## 2019-05-18 DIAGNOSIS — Z86718 Personal history of other venous thrombosis and embolism: Secondary | ICD-10-CM

## 2019-05-18 DIAGNOSIS — G8929 Other chronic pain: Secondary | ICD-10-CM | POA: Diagnosis present

## 2019-05-18 DIAGNOSIS — Z87891 Personal history of nicotine dependence: Secondary | ICD-10-CM

## 2019-05-18 DIAGNOSIS — D124 Benign neoplasm of descending colon: Secondary | ICD-10-CM | POA: Diagnosis not present

## 2019-05-18 DIAGNOSIS — Z20822 Contact with and (suspected) exposure to covid-19: Secondary | ICD-10-CM | POA: Diagnosis present

## 2019-05-18 DIAGNOSIS — I2510 Atherosclerotic heart disease of native coronary artery without angina pectoris: Secondary | ICD-10-CM | POA: Diagnosis not present

## 2019-05-18 DIAGNOSIS — D509 Iron deficiency anemia, unspecified: Secondary | ICD-10-CM | POA: Diagnosis not present

## 2019-05-18 DIAGNOSIS — M1990 Unspecified osteoarthritis, unspecified site: Secondary | ICD-10-CM | POA: Diagnosis present

## 2019-05-18 DIAGNOSIS — I10 Essential (primary) hypertension: Secondary | ICD-10-CM | POA: Diagnosis not present

## 2019-05-18 DIAGNOSIS — D122 Benign neoplasm of ascending colon: Secondary | ICD-10-CM | POA: Diagnosis not present

## 2019-05-18 DIAGNOSIS — I252 Old myocardial infarction: Secondary | ICD-10-CM

## 2019-05-18 DIAGNOSIS — Z86711 Personal history of pulmonary embolism: Secondary | ICD-10-CM

## 2019-05-18 DIAGNOSIS — Z951 Presence of aortocoronary bypass graft: Secondary | ICD-10-CM

## 2019-05-18 DIAGNOSIS — K635 Polyp of colon: Secondary | ICD-10-CM | POA: Diagnosis not present

## 2019-05-18 HISTORY — DX: Gastrointestinal hemorrhage, unspecified: K92.2

## 2019-05-18 LAB — BASIC METABOLIC PANEL
Anion gap: 11 (ref 5–15)
BUN: 37 mg/dL — ABNORMAL HIGH (ref 8–23)
CO2: 23 mmol/L (ref 22–32)
Calcium: 8.7 mg/dL — ABNORMAL LOW (ref 8.9–10.3)
Chloride: 102 mmol/L (ref 98–111)
Creatinine, Ser: 1.8 mg/dL — ABNORMAL HIGH (ref 0.61–1.24)
GFR calc Af Amer: 38 mL/min — ABNORMAL LOW (ref >60)
GFR calc non Af Amer: 33 mL/min — ABNORMAL LOW (ref >60)
Glucose, Bld: 156 mg/dL — ABNORMAL HIGH (ref 70–99)
Potassium: 3.8 mmol/L (ref 3.5–5.1)
Sodium: 136 mmol/L (ref 135–145)

## 2019-05-18 LAB — CBC
HCT: 22.8 % — ABNORMAL LOW (ref 39.0–52.0)
Hemoglobin: 7.2 g/dL — ABNORMAL LOW (ref 13.0–17.0)
MCH: 33 pg (ref 26.0–34.0)
MCHC: 31.6 g/dL (ref 30.0–36.0)
MCV: 104.6 fL — ABNORMAL HIGH (ref 80.0–100.0)
Platelets: 198 10*3/uL (ref 150–400)
RBC: 2.18 MIL/uL — ABNORMAL LOW (ref 4.22–5.81)
RDW: 13.9 % (ref 11.5–15.5)
WBC: 9.8 10*3/uL (ref 4.0–10.5)
nRBC: 0 % (ref 0.0–0.2)

## 2019-05-18 LAB — PROTIME-INR
INR: 1.6 — ABNORMAL HIGH (ref 0.8–1.2)
Prothrombin Time: 19.1 seconds — ABNORMAL HIGH (ref 11.4–15.2)

## 2019-05-18 LAB — POC SARS CORONAVIRUS 2 AG -  ED: SARS Coronavirus 2 Ag: NEGATIVE

## 2019-05-18 LAB — TROPONIN I (HIGH SENSITIVITY)
Troponin I (High Sensitivity): 7 ng/L (ref <18)
Troponin I (High Sensitivity): 7 ng/L (ref <18)

## 2019-05-18 LAB — POC OCCULT BLOOD, ED: Fecal Occult Bld: POSITIVE — AB

## 2019-05-18 LAB — SARS CORONAVIRUS 2 (TAT 6-24 HRS): SARS Coronavirus 2: NEGATIVE

## 2019-05-18 LAB — PREPARE RBC (CROSSMATCH)

## 2019-05-18 MED ORDER — FUROSEMIDE 40 MG PO TABS
40.0000 mg | ORAL_TABLET | Freq: Every day | ORAL | Status: DC
  Administered 2019-05-19 – 2019-05-25 (×7): 40 mg via ORAL
  Filled 2019-05-18 (×7): qty 1

## 2019-05-18 MED ORDER — LEVOTHYROXINE SODIUM 112 MCG PO TABS
112.0000 ug | ORAL_TABLET | Freq: Every day | ORAL | Status: DC
  Administered 2019-05-21 – 2019-05-25 (×5): 112 ug via ORAL
  Filled 2019-05-18 (×5): qty 1

## 2019-05-18 MED ORDER — POLYETHYLENE GLYCOL 3350 17 G PO PACK: 17.0000 g | PACK | Freq: Every day | ORAL | Status: DC | PRN

## 2019-05-18 MED ORDER — AMIODARONE HCL 200 MG PO TABS
200.0000 mg | ORAL_TABLET | Freq: Every day | ORAL | Status: DC
  Administered 2019-05-19 – 2019-05-25 (×7): 200 mg via ORAL
  Filled 2019-05-18 (×7): qty 1

## 2019-05-18 MED ORDER — PANTOPRAZOLE SODIUM 40 MG IV SOLR
40.0000 mg | Freq: Once | INTRAVENOUS | Status: AC
  Administered 2019-05-18: 40 mg via INTRAVENOUS
  Filled 2019-05-18: qty 40

## 2019-05-18 MED ORDER — SODIUM CHLORIDE 0.9% FLUSH
3.0000 mL | Freq: Once | INTRAVENOUS | Status: AC
  Administered 2019-05-20: 3 mL via INTRAVENOUS

## 2019-05-18 MED ORDER — LUBIPROSTONE 24 MCG PO CAPS
24.0000 ug | ORAL_CAPSULE | Freq: Two times a day (BID) | ORAL | Status: DC
  Administered 2019-05-18 – 2019-05-25 (×14): 24 ug via ORAL
  Filled 2019-05-18 (×15): qty 1

## 2019-05-18 MED ORDER — ACETAMINOPHEN 650 MG RE SUPP: 650.0000 mg | Freq: Four times a day (QID) | RECTAL | Status: DC | PRN

## 2019-05-18 MED ORDER — TAMSULOSIN HCL 0.4 MG PO CAPS
0.4000 mg | ORAL_CAPSULE | Freq: Every day | ORAL | Status: DC
  Administered 2019-05-19 – 2019-05-24 (×6): 0.4 mg via ORAL
  Filled 2019-05-18 (×6): qty 1

## 2019-05-18 MED ORDER — DOCUSATE SODIUM 100 MG PO CAPS: 200.0000 mg | ORAL_CAPSULE | Freq: Every day | ORAL | Status: DC | PRN

## 2019-05-18 MED ORDER — PANTOPRAZOLE SODIUM 40 MG PO TBEC
40.0000 mg | DELAYED_RELEASE_TABLET | Freq: Two times a day (BID) | ORAL | Status: DC
  Administered 2019-05-18 – 2019-05-25 (×14): 40 mg via ORAL
  Filled 2019-05-18 (×14): qty 1

## 2019-05-18 MED ORDER — METOPROLOL SUCCINATE ER 25 MG PO TB24
25.0000 mg | ORAL_TABLET | Freq: Every day | ORAL | Status: DC
  Administered 2019-05-19 – 2019-05-25 (×7): 25 mg via ORAL
  Filled 2019-05-18 (×7): qty 1

## 2019-05-18 MED ORDER — SODIUM CHLORIDE 0.9% FLUSH
3.0000 mL | Freq: Two times a day (BID) | INTRAVENOUS | Status: DC
  Administered 2019-05-18 – 2019-05-24 (×8): 3 mL via INTRAVENOUS

## 2019-05-18 MED ORDER — SODIUM CHLORIDE 0.9 % IV SOLN: INTRAVENOUS | Status: DC

## 2019-05-18 MED ORDER — ACETAMINOPHEN 325 MG PO TABS: 650.0000 mg | ORAL_TABLET | Freq: Four times a day (QID) | ORAL | Status: DC | PRN

## 2019-05-18 MED ORDER — BISACODYL 10 MG RE SUPP: 10.0000 mg | Freq: Every day | RECTAL | Status: DC | PRN

## 2019-05-18 MED ORDER — ROSUVASTATIN CALCIUM 5 MG PO TABS
10.0000 mg | ORAL_TABLET | Freq: Every day | ORAL | Status: DC
  Administered 2019-05-18 – 2019-05-24 (×7): 10 mg via ORAL
  Filled 2019-05-18 (×7): qty 2

## 2019-05-18 MED ORDER — SODIUM CHLORIDE 0.9% IV SOLUTION: Freq: Once | INTRAVENOUS | Status: DC

## 2019-05-18 NOTE — H&P (Signed)
History and Physical    Leonard MCCARVILLE Sr. [id number] DOB: 31-May-1929 DOA: 05/18/2019  PCP: [name], NP  Patient coming from: Home  I have personally briefly reviewed patient's old medical records in [place]  Chief Complaint: Weakness, chest pain  HPI: Leonard AARONS Sr. is a 86 y.o. male with medical history significant for paroxysmal atrial fibrillation status post cardioversion December 2020, on xarelto, coronary disease status post CABG 2011, heart failure with reduced ejection fraction (last LVEF 30-35%) peripheral artery disease with renal artery stent in 2012, carotid disease with history of bilateral carotid endarterectomies 2013, recurrent pulmonary emboli long-term oral anticoagulation with xarelto, hyperlipidemia, thyroid dz.  He has been suffering from progressive weakness since November 2020, and has seen his cardiologist for this problem multiple times, undergoing multiple medication adjustments and a cardioversion December 2020.  Unfortunately, he states approximately 2 weeks ago he began having dark, black tarry stools and noticed that he was feeling much more weak than usual.  For him and his wife he mostly has been down to his recliner for the last week or so.  He states that upon standing he feels lightheaded and weak and cannot ambulate further than 1 remote way before having to stop and take a break in the last couple of days. He endorses decreased appetite, denies nausea, vomiting, diarrhea, hematochezia, hematemesis, hematuria, abdominal pain or dyspepsia.  ED Course: On arrival to the ED pt was afebrile, heart rate 76, respiratory rate 18, blood pressure ranging between 106/44-151/60, saturating 98% on room air. Labs and imaging studies acquired. Initial labwork notable for creatinine 1.8 (appears baseline), BUN 37, WBC 9.8, hemoglobin 7.2 down from 14.6 just 2 months ago with an MCV of 104.6, platelets 198.  Troponin negative x2.  INR 1.6.   Imaging studies notable for no acute cardiopulmonary abnormalities.  Noted to have melena on rectal exam.  Patient was administered Protonix, 1 unit PRBCs.  EDP consulted GI Dr. Benson Norway. Given concern for GI bleed, decision was made to admit.   Review of Systems: As per HPI otherwise 10 point review of systems negative.   Past Medical History:  Diagnosis Date  . Anemia   . Anxiety   . Arrhythmia    PAROXYSMAL ATRIAL FIBRILLATION  . Arthritis    OSTEOARTHRITIS  . Cataracts, bilateral   . Chronic back pain   . Coronary artery disease 8/11   CABG...LM EMERGENT WITH IABP sees Dr. Legrand Como cooper  . DJD (degenerative joint disease)   . DVT (deep venous thrombosis) ([place])   . Dyspnea   . GERD (gastroesophageal reflux disease)   . History of BPH   . Hyperlipidemia   . Hypertension   . Hypothyroidism   . Lumbar post-laminectomy syndrome   . Myocardial infarction ([place])   . Pulmonary embolism ([place]) 2009   hx of  . RBBB (right bundle branch block)   . Renal artery stenosis ([place])   . Thrombocytopenia ([place])   . Thyroid disease    HYPOTHYROIDISM  . Unstable angina ([place])    NONE IN LONG TIME    Past Surgical History:  Procedure Laterality Date  . Blood clot  Feb.20,  2016   Pulmonary Embolism  . CARDIOVERSION N/A 03/15/2019   Procedure: CARDIOVERSION;  Surgeon: Pixie Casino, MD;  Location: [facility] ENDOSCOPY;  Service: Cardiovascular;  Laterality: N/A;  . CAROTID ENDARTERECTOMY Right Oct. 17, 2013   CE  . CAROTID ENDARTERECTOMY Left Dec. 3, 2016   CE  .  CATARACT EXTRACTION  05/2014,06/2014  . CORONARY ARTERY BYPASS GRAFT  11-21-09   EMERGENT X 3 GRAFTING. ..[name], MD, cc: [name]  . ENDARTERECTOMY  01/28/2012   Procedure: ENDARTERECTOMY CAROTID;  Surgeon: [name], MD;  Location: [place];  Service: Vascular;  Laterality: Right;  with Primary Closure of Artery  . ENDARTERECTOMY  03/15/2012   Procedure: ENDARTERECTOMY CAROTID;  Surgeon: [name],  MD;  Location: [place];  Service: Vascular;  Laterality: Left;  . FOOT SURGERY    . MANDIBLE SURGERY    . MAXIMUM ACCESS (MAS)POSTERIOR LUMBAR INTERBODY FUSION (PLIF) 2 LEVEL N/A 08/08/2015   Procedure: Lumbar Four-Five, Lumbar Five-Sacral One Maximum access posterior lumbar interbody fusion;  Surgeon: [name], MD;  Location: [place] NEURO ORS;  Service: Neurosurgery;  Laterality: N/A;  LUMBAR FOUR-FIVE ,LUMBAR FIVE -SACRAL Maximum access posterior lumbar interbody fusion  . RENAL ARTERY STENT  2010  . REPLACEMENT TOTAL KNEE  2006   RIGHT  . SPINAL CORD STIMULATOR INSERTION N/A 06/18/2017   Procedure: LUMBAR SPINAL CORD STIMULATOR INSERTION;  Surgeon: [name], MD;  Location: [facility];  Service: Neurosurgery;  Laterality: N/A;  LUMBAR SPINAL CORD STIMULATOR INSERTION  . TOTAL HIP ARTHROPLASTY     right     reports that he quit smoking about 45 years ago. His smoking use included cigarettes. He quit smokeless tobacco use about 17 years ago.  His smokeless tobacco use included chew. He reports current alcohol use of about 2.0 standard drinks of alcohol per week. He reports that he does not use drugs.  No Known Allergies  Family History  Problem Relation Age of Onset  . Lung cancer Father 48       deceased   . Cancer Father 42       LUNG  . Stroke Mother 73       DECEASED   . Hyperlipidemia Mother   . Heart attack Brother 35       deceased  . Diabetes Brother   . Liver cancer Sister 52       deceased   . Cancer Sister     Prior to Admission medications   Medication Sig Start Date End Date Taking? Authorizing Provider  acetaminophen (TYLENOL) 500 MG tablet Take 500 mg by mouth every 6 (six) hours as needed (for pain.).    [provider]  amiodarone (PACERONE) 200 MG tablet Take 1 tablet by mouth twice daily for 7 days, then take 1 tablet by mouth daily. 03/07/19   Bhagat, Bhavinkumar, PA  AMITIZA 24 MCG capsule Take 24 mcg by mouth 2 (two) times daily. 03/23/19   [provider]  aspirin EC 81 MG tablet Take 1 tablet (81 mg total) by mouth daily. 03/07/19   Bhagat, Crista Luria, PA  cetirizine (ZYRTEC) 10 MG tablet Take 10 mg by mouth daily as needed for allergies.     [provider]  docusate sodium (COLACE) 100 MG capsule Take 200 mg by mouth daily as needed for mild constipation.    [provider]  furosemide (LASIX) 40 MG tablet Take 1 tablet (40 mg total) by mouth daily. 06/16/17   [name], MD  guaiFENesin (MUCINEX) 600 MG 12 hr tablet Take 600 mg by mouth 2 (two) times daily as needed for cough or to loosen phlegm.    [provider]  levothyroxine (SYNTHROID) 125 MCG tablet Take 125 mcg by mouth daily before breakfast.    [provider]  lisinopril (  ZESTRIL) 5 MG tablet Take 1 tablet (5 mg total) by mouth daily. 03/31/19 06/29/19  Leanor Kail, PA  metoprolol succinate (TOPROL XL) 25 MG 24 hr tablet Take 1 tablet (25 mg total) by mouth daily. 03/31/19   Bhagat, Bhavinkumar, PA  mometasone (NASONEX) 50 MCG/ACT nasal spray Place 2 sprays into the nose daily as needed (for allergies).    [provider]  Multiple Vitamin (MULTIVITAMIN WITH MINERALS) TABS tablet Take 1 tablet by mouth daily.    [provider]  rosuvastatin (CRESTOR) 10 MG tablet TAKE ONE TABLET BY MOUTH IN THE EVENING 04/19/19   [name], NP  tamsulosin (FLOMAX) 0.4 MG CAPS capsule TAKE 1 CAPSULE BY MOUTH ONCE DAILY 12/17/18   [name], NP  triamcinolone cream (KENALOG) 0.1 % Apply 1 application topically 2 (two) times daily as needed (affected area(s) of ankle.).    [provider]  XARELTO 20 MG TABS tablet Take 1 tablet (20 mg total) by mouth daily with supper. 12/20/18   [name], MD    Physical Exam: Vitals:   05/18/19 1608 05/18/19 1615 05/18/19 1700 05/18/19 1745  BP: (!) 137/57 (!) 130/52 136/60 (!) 131/52  Pulse: 79 75 76 78  Resp: 20 17 18 16   Temp:      TempSrc:      SpO2:  97% 100% 100% 97%   Constitutional: NAD, calm, comfortable elderly man Eyes: PERRL, lids and conjunctivae normal ENMT: Mucous membranes are moist. Posterior pharynx clear of any exudate or lesions. Neck: normal, supple, no masses, no thyromegaly Respiratory: clear to auscultation bilaterally, no wheezing, no crackles. Normal respiratory effort. No accessory muscle use.  Cardiovascular: Regular rate and rhythm, no murmurs / rubs / gallops. No extremity edema. 2+ pedal pulses. No carotid bruits.  Abdomen: no tenderness, no masses palpated. No hepatosplenomegaly. Bowel sounds positive.  Musculoskeletal: no clubbing / cyanosis. No joint deformity upper and lower extremities. Good ROM, no contractures. Normal muscle tone.  Skin: no rashes, lesions, ulcers. No induration Neurologic: CN 2-12 grossly intact. Sensation intact. Strength 5/5 in all 4.  Psychiatric: Normal judgment and insight. Alert and oriented x 3. Normal mood.    Labs on Admission: I have personally reviewed following labs and imaging studies  CBC: Recent Labs  Lab 05/18/19 1420  WBC 9.8  HGB 7.2*  HCT 22.8*  MCV 104.6*  PLT 99991111   Basic Metabolic Panel: Recent Labs  Lab 05/18/19 1420  NA 136  K 3.8  CL 102  CO2 23  GLUCOSE 156*  BUN 37*  CREATININE 1.80*  CALCIUM 8.7*   GFR: CrCl cannot be calculated (Unknown ideal weight.). Liver Function Tests: No results for input(s): AST, ALT, ALKPHOS, BILITOT, PROT, ALBUMIN in the last 168 hours. No results for input(s): LIPASE, AMYLASE in the last 168 hours. No results for input(s): AMMONIA in the last 168 hours. Coagulation Profile: Recent Labs  Lab 05/18/19 1607  INR 1.6*   Cardiac Enzymes: No results for input(s): CKTOTAL, CKMB, CKMBINDEX, TROPONINI in the last 168 hours. BNP (last 3 results) No results for input(s): PROBNP in the last 8760 hours. HbA1C: No results for input(s): HGBA1C in the last 72 hours. CBG: No results for input(s): GLUCAP in the last  168 hours. Lipid Profile: No results for input(s): CHOL, HDL, LDLCALC, TRIG, CHOLHDL, LDLDIRECT in the last 72 hours. Thyroid Function Tests: No results for input(s): TSH, T4TOTAL, FREET4, T3FREE, THYROIDAB in the last 72 hours. Anemia Panel: No results for input(s): VITAMINB12, FOLATE,  FERRITIN, TIBC, IRON, RETICCTPCT in the last 72 hours. Urine analysis:    Component Value Date/Time   COLORURINE YELLOW 03/03/2012 [place] 03/03/2012 1317   LABSPEC 1.026 03/03/2012 1317   PHURINE 5.0 03/03/2012 1317   GLUCOSEU NEGATIVE 03/03/2012 1317   HGBUR NEGATIVE 03/03/2012 1317   BILIRUBINUR negative 11/19/2015 0839   KETONESUR 15 (A) 03/03/2012 1317   PROTEINUR 2+ 11/19/2015 0839   PROTEINUR NEGATIVE 03/03/2012 1317   UROBILINOGEN 0.2 11/19/2015 0839   UROBILINOGEN 0.2 03/03/2012 1317   NITRITE negative 11/19/2015 0839   NITRITE NEGATIVE 03/03/2012 1317   LEUKOCYTESUR small (1+) (A) 11/19/2015 0839    Radiological Exams on Admission: DG Chest 2 View  Result Date: 05/18/2019 CLINICAL DATA:  Chest pain EXAM: CHEST - 2 VIEW COMPARISON:  2016 FINDINGS: No new consolidation or edema. Mild scarring at the left lung base. No pleural effusion or pneumothorax. Stable cardiomediastinal contours. Interval placement of spinal stimulator. IMPRESSION: No acute process in the chest. Electronically Signed   By: Macy Mis M.D.   On: 05/18/2019 14:49    EKG: Independently reviewed.  Sinus rhythm, rate 74, right bundle morphology.  Assessment/Plan Active Problems:   GIB (gastrointestinal bleeding)  GIB Blood loss anemia Presents with symptoms of worsening weakness over the last week, hemoglobin acutely down to 7.4 from 14-1/2 2 months ago, and noted to have melena on exam.  Likely upper GI bleed.  Patient is on aspirin rivaroxaban at home for his atrial fibrillation as well as history of VTE.  Risk/benefit discussion held with patient and wife. Plan: -Holding aspirin and Xarelto  for now, monitor CBC every 12 -GI Dr. [name] (GI) consulted by EDP, appreciate recommendations -Maintain 2 large-bore IVs, continue to twice daily PPI, n.p.o. for now -Status post 1 unit PRBC in ED  Paroxysmal atrial fibrillation CAD HFrEF -Currently in sinus rhythm, will continue his home amiodarone and Toprol-XL -Holding aspirin and Xarelto as above, resume when safe from a bleeding perspective. -Continue Crestor -Patient is euvolemic to slightly hypervolemic on exam today, though will continue his home p.o. Lasix 40 mg daily especially in the setting of him receiving a blood transfusion tonight.  Monitor fluid status and renal function closely.  Hypothyroid Continue Synthroid, check TSH  BPH Continue Flomax  DVT prophylaxis: SCDs Code Status: DNR/limited this was discussed on the day of admission with the patient as well as his wife Leonard Berg phone number [phone] Family Communication: As above Disposition Plan: Likely DC home in 2 to 3 days Consults called: Dr. [name] (GI) consulted by EDP Admission status: Inpatient   S. Carolyne Fiscal, DO [facility] Pager [phone]  If 7PM-7AM, please contact night-coverage [email or web address] Password [email or web address]  05/18/2019, 6:10 PM

## 2019-05-18 NOTE — Consult Note (Signed)
Reason for Consult: Symptomatic anemia and melena Referring Physician: [facility]  Leonard Berg. HPI: This is an 86 year old male with a PMH of PAF s/p cardioversion 03/2019, CAD s/p CABG, PAD, and pulmonary emboli 2016 on Xarelto admitted for symptomatic anemia.  The patient reports the onset of his symptoms 3-4 days ago.  This coincided with the report of melena.  He felt progressively weaker and today he states that he almost passed out.  His HGB on admission was at 7.2 g/dL and has HGB on 03/15/2019 was at 14.6 g/dL.  There is no history of PUD and he denies having a prior colonoscopy.  There is no family history of colon cancer.  The patient does not report any problems with GERD, nausea, vomiting, hematemesis, or abdominal pain.  Past Medical History:  Diagnosis Date  . Anemia   . Anxiety   . Arrhythmia    PAROXYSMAL ATRIAL FIBRILLATION  . Arthritis    OSTEOARTHRITIS  . Cataracts, bilateral   . Chronic back pain   . Coronary artery disease 8/11   CABG...LM EMERGENT WITH IABP sees Dr. Legrand Como cooper  . DJD (degenerative joint disease)   . DVT (deep venous thrombosis) ([place])   . Dyspnea   . GERD (gastroesophageal reflux disease)   . History of BPH   . Hyperlipidemia   . Hypertension   . Hypothyroidism   . Lumbar post-laminectomy syndrome   . Myocardial infarction ([place])   . Pulmonary embolism ([place]) 2009   hx of  . RBBB (right bundle branch block)   . Renal artery stenosis ([place])   . Thrombocytopenia ([place])   . Thyroid disease    HYPOTHYROIDISM  . Unstable angina ([place])    NONE IN LONG TIME    Past Surgical History:  Procedure Laterality Date  . Blood clot  Feb.20,  2016   Pulmonary Embolism  . CARDIOVERSION N/A 03/15/2019   Procedure: CARDIOVERSION;  Surgeon: Pixie Casino, MD;  Location[facility] ENDOSCOPY;  Service: Cardiovascular;  Laterality: N/A;  . CAROTID ENDARTERECTOMY Right Oct. 17, 2013   CE  . CAROTID ENDARTERECTOMY Left Dec. 3, 2016   CE  .  CATARACT EXTRACTION  05/2014,06/2014  . CORONARY ARTERY BYPASS GRAFT  11-21-09   EMERGENT X 3 GRAFTING. ..[name], MD, cc: [name]  . ENDARTERECTOMY  01/28/2012   Procedure: ENDARTERECTOMY CAROTID;  Surgeon: [name], MD;  Location: [place] [facility];  Service: Vascular;  Laterality: Right;  with Primary Closure of Artery  . ENDARTERECTOMY  03/15/2012   Procedure: ENDARTERECTOMY CAROTID;  Surgeon: [name], MD;  Location: [facility];  Service: Vascular;  Laterality: Left;  . FOOT SURGERY    . MANDIBLE SURGERY    . MAXIMUM ACCESS (MAS)POSTERIOR LUMBAR INTERBODY FUSION (PLIF) 2 LEVEL N/A 08/08/2015   Procedure: Lumbar Four-Five, Lumbar Five-Sacral One Maximum access posterior lumbar interbody fusion;  Surgeon: [name], MD;  Location: [place] NEURO ORS;  Service: Neurosurgery;  Laterality: N/A;  LUMBAR FOUR-FIVE ,LUMBAR FIVE -SACRAL Maximum access posterior lumbar interbody fusion  . RENAL ARTERY STENT  2010  . REPLACEMENT TOTAL KNEE  2006   RIGHT  . SPINAL CORD STIMULATOR INSERTION N/A 06/18/2017   Procedure: LUMBAR SPINAL CORD STIMULATOR INSERTION;  Surgeon: [name], MD;  Location: [facility];  Service: Neurosurgery;  Laterality: N/A;  LUMBAR SPINAL CORD STIMULATOR INSERTION  . TOTAL HIP ARTHROPLASTY     right    Family History  Problem Relation Age of Onset  .  Lung cancer Father 98       deceased   . Cancer Father 26       LUNG  . Stroke Mother 16       DECEASED   . Hyperlipidemia Mother   . Heart attack Brother 71       deceased  . Diabetes Brother   . Liver cancer Sister 70       deceased   . Cancer Sister     Social History:  reports that he quit smoking about 45 years ago. His smoking use included cigarettes. He quit smokeless tobacco use about 17 years ago.  His smokeless tobacco use included chew. He reports current alcohol use of about 2.0 standard drinks of alcohol per week. He reports that he does not use drugs.  Allergies: No Known  Allergies  Medications:  Scheduled: . sodium chloride   Intravenous Once  . sodium chloride flush  3 mL Intravenous Once   Continuous:   Results for orders placed or performed during the hospital encounter of 05/18/19 (from the past 24 hour(s))  Basic metabolic panel     Status: Abnormal   Collection Time: 05/18/19  2:20 PM  Result Value Ref Range   Sodium 136 135 - 145 mmol/L   Potassium 3.8 3.5 - 5.1 mmol/L   Chloride 102 98 - 111 mmol/L   CO2 23 22 - 32 mmol/L   Glucose, Bld 156 (H) 70 - 99 mg/dL   BUN 37 (H) 8 - 23 mg/dL   Creatinine, Ser 1.80 (H) 0.61 - 1.24 mg/dL   Calcium 8.7 (L) 8.9 - 10.3 mg/dL   GFR calc non Af Amer 33 (L) >60 mL/min   GFR calc Af Amer 38 (L) >60 mL/min   Anion gap 11 5 - 15  CBC     Status: Abnormal   Collection Time: 05/18/19  2:20 PM  Result Value Ref Range   WBC 9.8 4.0 - 10.5 K/uL   RBC 2.18 (L) 4.22 - 5.81 MIL/uL   Hemoglobin 7.2 (L) 13.0 - 17.0 g/dL   HCT 22.8 (L) 39.0 - 52.0 %   MCV 104.6 (H) 80.0 - 100.0 fL   MCH 33.0 26.0 - 34.0 pg   MCHC 31.6 30.0 - 36.0 g/dL   RDW 13.9 11.5 - 15.5 %   Platelets 198 150 - 400 K/uL   nRBC 0.0 0.0 - 0.2 %  Troponin I (High Sensitivity)     Status: None   Collection Time: 05/18/19  2:20 PM  Result Value Ref Range   Troponin I (High Sensitivity) 7 <18 ng/L  POC occult blood, ED     Status: Abnormal   Collection Time: 05/18/19  3:54 PM  Result Value Ref Range   Fecal Occult Bld POSITIVE (A) NEGATIVE  Type and screen [place]     Status: None (Preliminary result)   Collection Time: 05/18/19  4:07 PM  Result Value Ref Range   ABO/RH(D) A POS    Antibody Screen NEG    Sample Expiration      05/21/2019,2359 Performed at [facility], [place] [address], [place], [place] [address]    Unit Number [id number]    Blood Component Type RED CELLS,LR    Unit division 00    Status of Unit ALLOCATED    Transfusion Status OK TO TRANSFUSE    Crossmatch Result Compatible   Troponin I  (High Sensitivity)     Status: None   Collection Time:  05/18/19  4:07 PM  Result Value Ref Range   Troponin I (High Sensitivity) 7 <18 ng/L  Protime-INR     Status: Abnormal   Collection Time: 05/18/19  4:07 PM  Result Value Ref Range   Prothrombin Time 19.1 (H) 11.4 - 15.2 seconds   INR 1.6 (H) 0.8 - 1.2  Prepare RBC     Status: None   Collection Time: 05/18/19  4:07 PM  Result Value Ref Range   Order Confirmation      ORDER PROCESSED BY BLOOD BANK Performed at [facility], [place] [address], [place], [place] [address]   POC SARS Coronavirus 2 Ag-ED - Nasal Swab (BD Veritor Kit)     Status: None   Collection Time: 05/18/19  5:16 PM  Result Value Ref Range   SARS Coronavirus 2 Ag NEGATIVE NEGATIVE     DG Chest 2 View  Result Date: 05/18/2019 CLINICAL DATA:  Chest pain EXAM: CHEST - 2 VIEW COMPARISON:  2016 FINDINGS: No new consolidation or edema. Mild scarring at the left lung base. No pleural effusion or pneumothorax. Stable cardiomediastinal contours. Interval placement of spinal stimulator. IMPRESSION: No acute process in the chest. Electronically Signed   By: Macy Mis M.D.   On: 05/18/2019 14:49    ROS:  As stated above in the HPI otherwise negative.  Blood pressure (!) 131/52, pulse 78, temperature 98.1 F (36.7 C), temperature source Oral, resp. rate 16, SpO2 97 %.    PE: Gen: NAD, Alert and Oriented HEENT:  [place]/AT, EOMI Neck: Supple, no LAD Lungs: CTA Bilaterally CV: RRR without M/G/R ABM: Soft, NTND, +BS Ext: No C/C/E  Assessment/Plan: 1) Symptomatic anemia. 2) Melena. 3) Heme positive stool. 4) History of PE on Xarelto.   The patient is stable, but there is a makred drop in his HGB from 14 down to 7.  Xarelto is not a new medication for him and he was started on anticoagulation after the diagnosis of his PE.  Further evaluation with an EGD/colonoscopy is warranted, but he is too weak to undergo the colonoscopy.  He does not feel as if he can prep for  the procedure.  Plan: 1) EGD tomorrow. 2) Transfuse for his symptomatic anemia.  Leonard Berg D 05/18/2019, 6:19 PM

## 2019-05-18 NOTE — ED Triage Notes (Signed)
Pt arrives to ED from home with complaints of weakness and generalized chest pain for the past week. Patient states that he is not able to get around the house now and gets winded so easily. Patient states hes lost his appetite as well.

## 2019-05-18 NOTE — ED Provider Notes (Signed)
[facility] EMERGENCY DEPARTMENT Provider Note   CSN: [id number] Arrival date & time: 05/18/19  1332     History Chief Complaint  Patient presents with  . Weakness  . Chest Pain    Leonard Back Swayne Sr. is a 86 y.o. male.  HPI 86 year old male with history of CHF, CAD, paroxysmal A. fib, history of recurrent PE and DVT, on Eliquis, presenting to the emergency department for weakness, fatigue, shortness of breath and dyspnea on exertion.  No fevers or chills, no hemoptysis or hematemesis, no hematochezia, however patient does report dark stools for the past week.  Patient states that the symptoms have been present for the last week as well.  Reports some mild chest pain but otherwise states that it comes and goes and is not super severe.  Denies any leg swelling, denies any leg pain, no abdominal pain, vomiting, diarrhea.  No recent sick contacts or recent travel.    Past Medical History:  Diagnosis Date  . Anemia   . Anxiety   . Arrhythmia    PAROXYSMAL ATRIAL FIBRILLATION  . Arthritis    OSTEOARTHRITIS  . Cataracts, bilateral   . Chronic back pain   . Coronary artery disease 8/11   CABG...LM EMERGENT WITH IABP sees Dr. Legrand Como cooper  . DJD (degenerative joint disease)   . DVT (deep venous thrombosis) ([place])   . Dyspnea   . GERD (gastroesophageal reflux disease)   . History of BPH   . Hyperlipidemia   . Hypertension   . Hypothyroidism   . Lumbar post-laminectomy syndrome   . Myocardial infarction ([place])   . Pulmonary embolism ([place]) 2009   hx of  . RBBB (right bundle branch block)   . Renal artery stenosis ([place])   . Thrombocytopenia ([place])   . Thyroid disease    HYPOTHYROIDISM  . Unstable angina ([place])    NONE IN LONG TIME    Patient Active Problem List   Diagnosis Date Noted  . Constipation 04/19/2018  . Medicare annual wellness visit, subsequent 11/19/2015  . Spondylolisthesis of lumbar region 08/08/2015  . Renal failure 05/23/2015  .  Pulmonary embolism ([place]) 06/03/2014  . Occlusion and stenosis of carotid artery without mention of cerebral infarction 01/27/2012  . Renal artery atherosclerosis ([place]) 07/16/2010  . CAROTID BRUIT 02/11/2010  . Mixed hyperlipidemia 12/17/2009  . Hypothyroidism 12/12/2009  . ANEMIA 12/12/2009  . THROMBOCYTOPENIA 12/12/2009  . Essential hypertension 12/12/2009  . UNSTABLE ANGINA 12/12/2009  . Coronary atherosclerosis of native coronary artery 12/12/2009  . RIGHT BUNDLE BRANCH BLOCK 12/12/2009  . PAROXYSMAL ATRIAL FIBRILLATION 12/12/2009  . Osteoarthritis 12/12/2009  . BPH (benign prostatic hyperplasia) 12/12/2009    Past Surgical History:  Procedure Laterality Date  . Blood clot  Feb.20,  2016   Pulmonary Embolism  . CARDIOVERSION N/A 03/15/2019   Procedure: CARDIOVERSION;  Surgeon: Pixie Casino, MD;  [facility] END[facility]COPY;  Service: Cardiovascular;  Laterality: N/A;  . CAROTID ENDARTERECTOMY Right Oct. 17, 2013   CE  . CAROTID ENDARTERECTOMY Left Dec. 3, 2016   CE  . CATARACT EXTRACTION  05/2014,06/2014  . CORONARY ARTERY BYPASS GRAFT  11-21-09   EMERGENT X 3 GRAFTING. ..[name], MD, cc: [name]  . ENDARTERECTOMY  01/28/2012   Procedure: ENDARTERECTOMY CAROTID;  Surgeon: [name], MD;  Location: [facility];  Service: Vascular;  Laterality: Right;  with Primary Closure of Artery  . ENDARTERECTOMY  03/15/2012   Procedure: ENDARTERECTOMY CAROTID;  Surgeon: [name], MD;  Location: [facility] OR;  Service: Vascular;  Laterality: Left;  . FOOT SURGERY    . MANDIBLE SURGERY    . MAXIMUM ACCESS (MAS)POSTERIOR LUMBAR INTERBODY FUSION (PLIF) 2 LEVEL N/A 08/08/2015   Procedure: Lumbar Four-Five, Lumbar Five-Sacral One Maximum access posterior lumbar interbody fusion;  Surgeon: [name], MD;  Location: [facility] NEURO ORS;  Service: Neurosurgery;  Laterality: N/A;  LUMBAR FOUR-FIVE ,LUMBAR FIVE -SACRAL Maximum access posterior lumbar interbody fusion  . RENAL  ARTERY STENT  2010  . REPLACEMENT TOTAL KNEE  2006   RIGHT  . SPINAL CORD STIMULATOR INSERTION N/A 06/18/2017   Procedure: LUMBAR SPINAL CORD STIMULATOR INSERTION;  Surgeon: [name], MD;  Location: [facility];  Service: Neurosurgery;  Laterality: N/A;  LUMBAR SPINAL CORD STIMULATOR INSERTION  . TOTAL HIP ARTHROPLASTY     right       Family History  Problem Relation Age of Onset  . Lung cancer Father 61       deceased   . Cancer Father 41       LUNG  . Stroke Mother 56       DECEASED   . Hyperlipidemia Mother   . Heart attack Brother 34       deceased  . Diabetes Brother   . Liver cancer Sister 95       deceased   . Cancer Sister     Social History   Tobacco Use  . Smoking status: Former Smoker    Types: Cigarettes    Quit date: 04/13/1974    Years since quitting: 45.1  . Smokeless tobacco: Former Systems developer    Types: Chew    Quit date: 01/26/2002  . Tobacco comment: He has smoked about 27-pack-year hx   Substance Use Topics  . Alcohol use: Yes    Alcohol/week: 2.0 standard drinks    Types: 2 Cans of beer per week    Comment: He used to drink more heavily , but rarely drinks at the current time  . Drug use: No    Home Medications Prior to Admission medications   Medication Sig Start Date End Date Taking? Authorizing Provider  acetaminophen (TYLENOL) 500 MG tablet Take 500 mg by mouth every 6 (six) hours as needed (for pain.).    [provider]  amiodarone (PACERONE) 200 MG tablet Take 1 tablet by mouth twice daily for 7 days, then take 1 tablet by mouth daily. 03/07/19   Bhagat, Bhavinkumar, PA  AMITIZA 24 MCG capsule Take 24 mcg by mouth 2 (two) times daily. 03/23/19   [provider]  aspirin EC 81 MG tablet Take 1 tablet (81 mg total) by mouth daily. 03/07/19   Bhagat, Crista Luria, PA  cetirizine (ZYRTEC) 10 MG tablet Take 10 mg by mouth daily as needed for allergies.     [provider]  docusate sodium (COLACE) 100 MG capsule Take 200 mg  by mouth daily as needed for mild constipation.    [provider]  furosemide (LASIX) 40 MG tablet Take 1 tablet (40 mg total) by mouth daily. 06/16/17   [name], MD  guaiFENesin (MUCINEX) 600 MG 12 hr tablet Take 600 mg by mouth 2 (two) times daily as needed for cough or to loosen phlegm.    [provider]  levothyroxine (SYNTHROID) 125 MCG tablet Take 125 mcg by mouth daily before breakfast.    [provider]  lisinopril (ZESTRIL) 5 MG tablet Take 1 tablet (5 mg total) by mouth daily. 03/31/19 06/29/19  Bhagat,  Bhavinkumar, PA  metoprolol succinate (TOPROL XL) 25 MG 24 hr tablet Take 1 tablet (25 mg total) by mouth daily. 03/31/19   Bhagat, Bhavinkumar, PA  mometasone (NASONEX) 50 MCG/ACT nasal spray Place 2 sprays into the nose daily as needed (for allergies).    [provider]  Multiple Vitamin (MULTIVITAMIN WITH MINERALS) TABS tablet Take 1 tablet by mouth daily.    [provider]  rosuvastatin (CRESTOR) 10 MG tablet TAKE ONE TABLET BY MOUTH IN THE EVENING 04/19/19   [name], NP  tamsulosin (FLOMAX) 0.4 MG CAPS capsule TAKE 1 CAPSULE BY MOUTH ONCE DAILY 12/17/18   [name], NP  triamcinolone cream (KENALOG) 0.1 % Apply 1 application topically 2 (two) times daily as needed (affected area(s) of ankle.).    [provider]  XARELTO 20 MG TABS tablet Take 1 tablet (20 mg total) by mouth daily with supper. 12/20/18   [name], MD    Allergies    Patient has no known allergies.  Review of Systems   Review of Systems  Constitutional: Positive for fatigue. Negative for chills and fever.  HENT: Negative for ear pain and sore throat.   Eyes: Negative for pain and visual disturbance.  Respiratory: Positive for shortness of breath. Negative for cough.   Cardiovascular: Negative for chest pain and palpitations.  Gastrointestinal: Positive for blood in stool. Negative for abdominal pain and vomiting.  Genitourinary:  Negative for dysuria and hematuria.  Musculoskeletal: Negative for arthralgias and back pain.  Skin: Negative for color change and rash.  Neurological: Positive for weakness. Negative for seizures and syncope.  All other systems reviewed and are negative.   Physical Exam Updated Vital Signs BP (!) 131/52   Pulse 78   Temp 98.1 F (36.7 C) (Oral)   Resp 16   SpO2 97%   Physical Exam Vitals and nursing note reviewed.  Constitutional:      General: He is not in acute distress.    Appearance: He is not ill-appearing, toxic-appearing or diaphoretic.     Comments: Elderly, frail appearing  HENT:     Head: Normocephalic and atraumatic.  Eyes:     Conjunctiva/sclera: Conjunctivae normal.     Pupils: Pupils are equal, round, and reactive to light.  Cardiovascular:     Rate and Rhythm: Normal rate and regular rhythm.     Heart sounds: Heart sounds not distant. No murmur.  Pulmonary:     Effort: Pulmonary effort is normal. No respiratory distress.     Breath sounds: Normal breath sounds. No decreased breath sounds, wheezing, rhonchi or rales.  Abdominal:     General: Bowel sounds are normal.     Palpations: Abdomen is soft. There is no hepatomegaly, splenomegaly or mass.     Tenderness: There is no abdominal tenderness.  Genitourinary:    Comments: Chaperone present Melanotic stool present Musculoskeletal:     Cervical back: Normal range of motion and neck supple.     Right lower leg: No edema.     Left lower leg: No edema.  Skin:    General: Skin is warm and dry.     Capillary Refill: Capillary refill takes less than 2 seconds.  Neurological:     General: No focal deficit present.     Mental Status: He is alert.     ED Results / Procedures / Treatments   Labs (all labs ordered are listed, but only abnormal results are displayed) Labs Reviewed  BASIC METABOLIC PANEL -  Abnormal; Notable for the following components:      Result Value   Glucose, Bld 156 (*)    BUN 37  (*)    Creatinine, Ser 1.80 (*)    Calcium 8.7 (*)    GFR calc non Af Amer 33 (*)    GFR calc Af Amer 38 (*)    All other components within normal limits  CBC - Abnormal; Notable for the following components:   RBC 2.18 (*)    Hemoglobin 7.2 (*)    HCT 22.8 (*)    MCV 104.6 (*)    All other components within normal limits  PROTIME-INR - Abnormal; Notable for the following components:   Prothrombin Time 19.1 (*)    INR 1.6 (*)    All other components within normal limits  POC OCCULT BLOOD, ED - Abnormal; Notable for the following components:   Fecal Occult Bld POSITIVE (*)    All other components within normal limits  SARS CORONAVIRUS 2 (TAT 6-24 HRS)  POC SARS CORONAVIRUS 2 AG -  ED  TYPE AND SCREEN  PREPARE RBC (CROSSMATCH)  TROPONIN I (HIGH SENSITIVITY)  TROPONIN I (HIGH SENSITIVITY)    EKG EKG Interpretation  Date/Time:  Thursday May 18 2019 14:06:28 EST Ventricular Rate:  74 PR Interval:  172 QRS Duration: 162 QT Interval:  468 QTC Calculation: 519 R Axis:   21 Text Interpretation: Normal sinus rhythm Right bundle branch block Inferior infarct , age undetermined Abnormal ECG rate faster than previous however RBBB is old Confirmed by Theotis Burrow [phone]) on 05/18/2019 3:07:53 PM   Radiology DG Chest 2 View  Result Date: 05/18/2019 CLINICAL DATA:  Chest pain EXAM: CHEST - 2 VIEW COMPARISON:  2016 FINDINGS: No new consolidation or edema. Mild scarring at the left lung base. No pleural effusion or pneumothorax. Stable cardiomediastinal contours. Interval placement of spinal stimulator. IMPRESSION: No acute process in the chest. Electronically Signed   By: Macy Mis M.D.   On: 05/18/2019 14:49    Procedures Procedures (including critical care time)  Medications Ordered in ED Medications  sodium chloride flush (NS) 0.9 % injection 3 mL (has no administration in time range)  0.9 %  sodium chloride infusion (Manually program via Guardrails IV Fluids) (has no  administration in time range)  pantoprazole (PROTONIX) injection 40 mg (40 mg Intravenous Given 05/18/19 1800)    ED Course  I have reviewed the triage vital signs and the nursing notes.  Pertinent labs & imaging results that were available during my care of the patient were reviewed by me and considered in my medical decision making (see chart for details).    MDM Rules/Calculators/A&P                      86 year old male presenting with fatigue.  On arrival patient was hemodynamically stable, afebrile.  Patient does appear frail and fatigued on my exam.  Melanotic stools present on a rectal exam, no active bleeding present.  Otherwise rectal exam normal.  Patient is on Eliquis secondary to paroxysmal A. fib as well as recurrent DVT and PE.  Likely upper GI bleed.  Hemoglobin dropped from 14-7 over the past 2 months.  Likely causing patient's symptoms, EKG reassuring with no signs of acute ischemic changes.  Initial troponin normal.  Doubt ACS, patient does have history of CHF as well that could be contributing however there is no lower extremity swelling, chest x-ray is clear, patient is on room air breathing  comfortably.  Doubt CHF exacerbation.  Creatinine appears to be slowly uptrending over the past 2 to 3 months, baseline around 1.4 and today is 1.8.  Otherwise metabolic panel with normal electrolytes, potassium and sodium normal as well.  Bicarb normal. Trop normal. INR sub thera at 1.6.  CXR clear, no volume or edema noted.   Spoke with gastroenterologist on-call, Dr. Benson Norway, will see the patient. Protonix given. Admitted to hospitalist in stable condition. 1U PRBCs given as well given history of CAD, ran slowly. Likely has GI bleed contributing, will need admission for serial CBC and continued work up.   The attending physician was present and available for all medical decision making and procedures related to this patient's care.  Final Clinical Impression(s) / ED Diagnoses Final  diagnoses:  Weakness  Gastrointestinal hemorrhage, unspecified gastrointestinal hemorrhage type    Rx / DC Orders ED Discharge Orders    None       Kizzie Fantasia, MD 05/18/19 1832    Rex Kras Wenda Overland, MD 05/23/19 [phone]

## 2019-05-18 NOTE — H&P (View-Only) (Signed)
Reason for Consult: Symptomatic anemia and melena Referring Physician: [facility]  Nyron R Sipos Sr. HPI: This is an 86 year old male with a PMH of PAF s/p cardioversion 03/2019, CAD s/p CABG, PAD, and pulmonary emboli 2016 on Xarelto admitted for symptomatic anemia.  The patient reports the onset of his symptoms 3-4 days ago.  This coincided with the report of melena.  He felt progressively weaker and today he states that he almost passed out.  His HGB on admission was at 7.2 g/dL and has HGB on 03/15/2019 was at 14.6 g/dL.  There is no history of PUD and he denies having a prior colonoscopy.  There is no family history of colon cancer.  The patient does not report any problems with GERD, nausea, vomiting, hematemesis, or abdominal pain.  Past Medical History:  Diagnosis Date  . Anemia   . Anxiety   . Arrhythmia    PAROXYSMAL ATRIAL FIBRILLATION  . Arthritis    OSTEOARTHRITIS  . Cataracts, bilateral   . Chronic back pain   . Coronary artery disease 8/11   CABG...LM EMERGENT WITH IABP sees Dr. Legrand Como cooper  . DJD (degenerative joint disease)   . DVT (deep venous thrombosis) ([place])   . Dyspnea   . GERD (gastroesophageal reflux disease)   . History of BPH   . Hyperlipidemia   . Hypertension   . Hypothyroidism   . Lumbar post-laminectomy syndrome   . Myocardial infarction ([place])   . Pulmonary embolism ([place]) 2009   hx of  . RBBB (right bundle branch block)   . Renal artery stenosis ([place])   . Thrombocytopenia ([place])   . Thyroid disease    HYPOTHYROIDISM  . Unstable angina ([place])    NONE IN LONG TIME    Past Surgical History:  Procedure Laterality Date  . Blood clot  Feb.20,  2016   Pulmonary Embolism  . CARDIOVERSION N/A 03/15/2019   Procedure: CARDIOVERSION;  Surgeon: Pixie Casino, MD;  Location: [facility] ENDOSCOPY;  Service: Cardiovascular;  Laterality: N/A;  . CAROTID ENDARTERECTOMY Right Oct. 17, 2013   CE  . CAROTID ENDARTERECTOMY Left Dec. 3, 2016   CE  .  CATARACT EXTRACTION  05/2014,06/2014  . CORONARY ARTERY BYPASS GRAFT  11-21-09   EMERGENT X 3 GRAFTING. ..[name], MD, cc: [name]  . ENDARTERECTOMY  01/28/2012   Procedure: ENDARTERECTOMY CAROTID;  Surgeon: [name], MD;  Location: [facility];  Service: Vascular;  Laterality: Right;  with Primary Closure of Artery  . ENDARTERECTOMY  03/15/2012   Procedure: ENDARTERECTOMY CAROTID;  Surgeon: [name], MD;  Location: [facility];  Service: Vascular;  Laterality: Left;  . FOOT SURGERY    . MANDIBLE SURGERY    . MAXIMUM ACCESS (MAS)POSTERIOR LUMBAR INTERBODY FUSION (PLIF) 2 LEVEL N/A 08/08/2015   Procedure: Lumbar Four-Five, Lumbar Five-Sacral One Maximum access posterior lumbar interbody fusion;  Surgeon: [name], MD;  Location: [place] NEURO ORS;  Service: Neurosurgery;  Laterality: N/A;  LUMBAR FOUR-FIVE ,LUMBAR FIVE -SACRAL Maximum access posterior lumbar interbody fusion  . RENAL ARTERY STENT  2010  . REPLACEMENT TOTAL KNEE  2006   RIGHT  . SPINAL CORD STIMULATOR INSERTION N/A 06/18/2017   Procedure: LUMBAR SPINAL CORD STIMULATOR INSERTION;  Surgeon: [name], MD;  Location: [facility];  Service: Neurosurgery;  Laterality: N/A;  LUMBAR SPINAL CORD STIMULATOR INSERTION  . TOTAL HIP ARTHROPLASTY     right    Family History  Problem Relation Age of Onset  .  Lung cancer Father 16       deceased   . Cancer Father 7       LUNG  . Stroke Mother 85       DECEASED   . Hyperlipidemia Mother   . Heart attack Brother 71       deceased  . Diabetes Brother   . Liver cancer Sister 80       deceased   . Cancer Sister     Social History:  reports that he quit smoking about 45 years ago. His smoking use included cigarettes. He quit smokeless tobacco use about 17 years ago.  His smokeless tobacco use included chew. He reports current alcohol use of about 2.0 standard drinks of alcohol per week. He reports that he does not use drugs.  Allergies: No Known  Allergies  Medications:  Scheduled: . sodium chloride   Intravenous Once  . sodium chloride flush  3 mL Intravenous Once   Continuous:   Results for orders placed or performed during the hospital encounter of 05/18/19 (from the past 24 hour(s))  Basic metabolic panel     Status: Abnormal   Collection Time: 05/18/19  2:20 PM  Result Value Ref Range   Sodium 136 135 - 145 mmol/L   Potassium 3.8 3.5 - 5.1 mmol/L   Chloride 102 98 - 111 mmol/L   CO2 23 22 - 32 mmol/L   Glucose, Bld 156 (H) 70 - 99 mg/dL   BUN 37 (H) 8 - 23 mg/dL   Creatinine, Ser 1.80 (H) 0.61 - 1.24 mg/dL   Calcium 8.7 (L) 8.9 - 10.3 mg/dL   GFR calc non Af Amer 33 (L) >60 mL/min   GFR calc Af Amer 38 (L) >60 mL/min   Anion gap 11 5 - 15  CBC     Status: Abnormal   Collection Time: 05/18/19  2:20 PM  Result Value Ref Range   WBC 9.8 4.0 - 10.5 K/uL   RBC 2.18 (L) 4.22 - 5.81 MIL/uL   Hemoglobin 7.2 (L) 13.0 - 17.0 g/dL   HCT 22.8 (L) 39.0 - 52.0 %   MCV 104.6 (H) 80.0 - 100.0 fL   MCH 33.0 26.0 - 34.0 pg   MCHC 31.6 30.0 - 36.0 g/dL   RDW 13.9 11.5 - 15.5 %   Platelets 198 150 - 400 K/uL   nRBC 0.0 0.0 - 0.2 %  Troponin I (High Sensitivity)     Status: None   Collection Time: 05/18/19  2:20 PM  Result Value Ref Range   Troponin I (High Sensitivity) 7 <18 ng/L  POC occult blood, ED     Status: Abnormal   Collection Time: 05/18/19  3:54 PM  Result Value Ref Range   Fecal Occult Bld POSITIVE (A) NEGATIVE  Type and screen [place]     Status: None (Preliminary result)   Collection Time: 05/18/19  4:07 PM  Result Value Ref Range   ABO/RH(D) A POS    Antibody Screen NEG    Sample Expiration      05/21/2019,2359 Performed at [facility], [place] [address] [place], [place] [address]    Unit Number [id number]    Blood Component Type RED CELLS,LR    Unit division 00    Status of Unit ALLOCATED    Transfusion Status OK TO TRANSFUSE    Crossmatch Result Compatible   Troponin I  (High Sensitivity)     Status: None   Collection Time:  05/18/19  4:07 PM  Result Value Ref Range   Troponin I (High Sensitivity) 7 <18 ng/L  Protime-INR     Status: Abnormal   Collection Time: 05/18/19  4:07 PM  Result Value Ref Range   Prothrombin Time 19.1 (H) 11.4 - 15.2 seconds   INR 1.6 (H) 0.8 - 1.2  Prepare RBC     Status: None   Collection Time: 05/18/19  4:07 PM  Result Value Ref Range   Order Confirmation      ORDER PROCESSED BY BLOOD BANK Performed at [facility], [place] [address], [place], [place] [address]   POC SARS Coronavirus 2 Ag-ED - Nasal Swab (BD Veritor Kit)     Status: None   Collection Time: 05/18/19  5:16 PM  Result Value Ref Range   SARS Coronavirus 2 Ag NEGATIVE NEGATIVE     DG Chest 2 View  Result Date: 05/18/2019 CLINICAL DATA:  Chest pain EXAM: CHEST - 2 VIEW COMPARISON:  2016 FINDINGS: No new consolidation or edema. Mild scarring at the left lung base. No pleural effusion or pneumothorax. Stable cardiomediastinal contours. Interval placement of spinal stimulator. IMPRESSION: No acute process in the chest. Electronically Signed   By: Macy Mis M.D.   On: 05/18/2019 14:49    ROS:  As stated above in the HPI otherwise negative.  Blood pressure (!) 131/52, pulse 78, temperature 98.1 F (36.7 C), temperature source Oral, resp. rate 16, SpO2 97 %.    PE: Gen: NAD, Alert and Oriented HEENT:  /AT, EOMI Neck: Supple, no LAD Lungs: CTA Bilaterally CV: RRR without M/G/R ABM: Soft, NTND, +BS Ext: No C/C/E  Assessment/Plan: 1) Symptomatic anemia. 2) Melena. 3) Heme positive stool. 4) History of PE on Xarelto.   The patient is stable, but there is a makred drop in his HGB from 14 down to 7.  Xarelto is not a new medication for him and he was started on anticoagulation after the diagnosis of his PE.  Further evaluation with an EGD/colonoscopy is warranted, but he is too weak to undergo the colonoscopy.  He does not feel as if he can prep for  the procedure.  Plan: 1) EGD tomorrow. 2) Transfuse for his symptomatic anemia.  Keriann Rankin D 05/18/2019, 6:19 PM

## 2019-05-18 NOTE — ED Notes (Addendum)
ED TO INPATIENT HANDOFF REPORT  ED Nurse Name and Phone #: [phone]  S Name/Age/Gender Leonard [name] 86 y.o. male Room/Bed: 050C/050C  Code Status   Code Status: Prior  Home/SNF/Other Home Patient oriented to: self, place, time and situation Is this baseline? Yes   Triage Complete: Triage complete  Chief Complaint GIB (gastrointestinal bleeding) [K92.2]  Triage Note Pt arrives to ED from home with complaints of weakness and generalized chest pain for the past week. Patient states that he is not able to get around the house now and gets winded so easily. Patient states hes lost his appetite as well.     Allergies No Known Allergies  Level of Care/Admitting Diagnosis ED Disposition    ED Disposition Condition Comment   Admit  Hospital Area: [facility] [[id number]]  Level of Care: Med-Surg [16]  Covid Evaluation: Asymptomatic Screening Protocol (No Symptoms)  Diagnosis: GIB (gastrointestinal bleeding) [259563]  Admitting Physician: Margart Sickles [[id number]]  Attending Physician: Margart Sickles [[id number]]  Estimated length of stay: 3 - 4 days  Certification:: I certify this patient will need inpatient services for at least 2 midnights       B Medical/Surgery History Past Medical History:  Diagnosis Date  . Anemia   . Anxiety   . Arrhythmia    PAROXYSMAL ATRIAL FIBRILLATION  . Arthritis    OSTEOARTHRITIS  . Cataracts, bilateral   . Chronic back pain   . Coronary artery disease 8/11   CABG...LM EMERGENT WITH IABP sees Dr. Legrand Como cooper  . DJD (degenerative joint disease)   . DVT (deep venous thrombosis) ([place])   . Dyspnea   . GERD (gastroesophageal reflux disease)   . History of BPH   . Hyperlipidemia   . Hypertension   . Hypothyroidism   . Lumbar post-laminectomy syndrome   . Myocardial infarction ([place])   . Pulmonary embolism ([place]) 2009   hx of  . RBBB (right bundle branch block)   . Renal artery stenosis ([place])   .  Thrombocytopenia ([place])   . Thyroid disease    HYPOTHYROIDISM  . Unstable angina ([place])    NONE IN LONG TIME   Past Surgical History:  Procedure Laterality Date  . Blood clot  Feb.20,  2016   Pulmonary Embolism  . CARDIOVERSION N/A 03/15/2019   Procedure: CARDIOVERSION;  Surgeon: Pixie Casino, MD;  Location: [facility] ENDOSCOPY;  Service: Cardiovascular;  Laterality: N/A;  . CAROTID ENDARTERECTOMY Right Oct. 17, 2013   CE  . CAROTID ENDARTERECTOMY Left Dec. 3, 2016   CE  . CATARACT EXTRACTION  05/2014,06/2014  . CORONARY ARTERY BYPASS GRAFT  11-21-09   EMERGENT X 3 GRAFTING. ..[name], MD, cc: [name]  . ENDARTERECTOMY  01/28/2012   Procedure: ENDARTERECTOMY CAROTID;  Surgeon: [name], MD;  Location: [place];  Service: Vascular;  Laterality: Right;  with Primary Closure of Artery  . ENDARTERECTOMY  03/15/2012   Procedure: ENDARTERECTOMY CAROTID;  Surgeon: [name], MD;  Location: [place];  Service: Vascular;  Laterality: Left;  . FOOT SURGERY    . MANDIBLE SURGERY    . MAXIMUM ACCESS (MAS)POSTERIOR LUMBAR INTERBODY FUSION (PLIF) 2 LEVEL N/A 08/08/2015   Procedure: Lumbar Four-Five, Lumbar Five-Sacral One Maximum access posterior lumbar interbody fusion;  Surgeon: [name], MD;  Location: [place] NEURO ORS;  Service: Neurosurgery;  Laterality: N/A;  LUMBAR FOUR-FIVE ,LUMBAR FIVE -SACRAL Maximum access posterior lumbar interbody fusion  . RENAL ARTERY STENT  2010  .  REPLACEMENT TOTAL KNEE  2006   RIGHT  . SPINAL CORD STIMULATOR INSERTION N/A 06/18/2017   Procedure: LUMBAR SPINAL CORD STIMULATOR INSERTION;  Surgeon: [name], MD;  Location: [place] [facility];  Service: Neurosurgery;  Laterality: N/A;  LUMBAR SPINAL CORD STIMULATOR INSERTION  . TOTAL HIP ARTHROPLASTY     right     A IV Location/Drains/Wounds Patient Lines/Drains/Airways Status   Active Line/Drains/Airways    Name:   Placement date:   Placement time:   Site:   Days:   Peripheral IV 05/18/19  Right Antecubital   05/18/19    1608    Antecubital   less than 1   External Urinary Catheter   08/11/15    1310    --   1376   Incision 01/28/12 Neck Right   01/28/12    0851     2667   Incision 03/15/12 Neck Left   03/15/12    1202     2620   Incision (Closed) 08/08/15 Back Other (Comment)   08/08/15    1349     1379   Incision (Closed) 06/18/17 Back   06/18/17    0838     699          Intake/Output Last 24 hours No intake or output data in the 24 hours ending 05/18/19 2022  Labs/Imaging Results for orders placed or performed during the hospital encounter of 05/18/19 (from the past 48 hour(s))  Basic metabolic panel     Status: Abnormal   Collection Time: 05/18/19  2:20 PM  Result Value Ref Range   Sodium 136 135 - 145 mmol/L   Potassium 3.8 3.5 - 5.1 mmol/L   Chloride 102 98 - 111 mmol/L   CO2 23 22 - 32 mmol/L   Glucose, Bld 156 (H) 70 - 99 mg/dL   BUN 37 (H) 8 - 23 mg/dL   Creatinine, Ser 1.80 (H) 0.61 - 1.24 mg/dL   Calcium 8.7 (L) 8.9 - 10.3 mg/dL   GFR calc non Af Amer 33 (L) >60 mL/min   GFR calc Af Amer 38 (L) >60 mL/min   Anion gap 11 5 - 15    Comment: Performed at [facility], [address], [place], [place] [address]  CBC     Status: Abnormal   Collection Time: 05/18/19  2:20 PM  Result Value Ref Range   WBC 9.8 4.0 - 10.5 K/uL   RBC 2.18 (L) 4.22 - 5.81 MIL/uL   Hemoglobin 7.2 (L) 13.0 - 17.0 g/dL   HCT 22.8 (L) 39.0 - 52.0 %   MCV 104.6 (H) 80.0 - 100.0 fL   MCH 33.0 26.0 - 34.0 pg   MCHC 31.6 30.0 - 36.0 g/dL   RDW 13.9 11.5 - 15.5 %   Platelets 198 150 - 400 K/uL   nRBC 0.0 0.0 - 0.2 %    Comment: Performed at [facility], [place] [address] [place], [place] [address]  Troponin I (High Sensitivity)     Status: None   Collection Time: 05/18/19  2:20 PM  Result Value Ref Range   Troponin I (High Sensitivity) 7 <18 ng/L    Comment: (NOTE) Elevated high sensitivity troponin I (hsTnI) values and significant  changes across serial  measurements may suggest ACS but many other  chronic and acute conditions are known to elevate hsTnI results.  Refer to the "Links" section for chest pain algorithms and additional  guidance. Performed at [facility], [address] [place], [place] [address]   POC occult blood, ED     Status: Abnormal   Collection Time: 05/18/19  3:54 PM  Result Value Ref Range   Fecal Occult Bld POSITIVE (A) NEGATIVE  Type and screen [place]     Status: None (Preliminary result)   Collection Time: 05/18/19  4:07 PM  Result Value Ref Range   ABO/RH(D) A POS    Antibody Screen NEG    Sample Expiration 05/21/2019,2359    Unit Number [id number]    Blood Component Type RED CELLS,LR    Unit division 00    Status of Unit ISSUED    Transfusion Status OK TO TRANSFUSE    Crossmatch Result      Compatible Performed at [facility], [place] [address] [place], [place] [address]   Troponin I (High Sensitivity)     Status: None   Collection Time: 05/18/19  4:07 PM  Result Value Ref Range   Troponin I (High Sensitivity) 7 <18 ng/L    Comment: (NOTE) Elevated high sensitivity troponin I (hsTnI) values and significant  changes across serial measurements may suggest ACS but many other  chronic and acute conditions are known to elevate hsTnI results.  Refer to the "Links" section for chest pain algorithms and additional  guidance. Performed at [facility], [place] [address] [place], [place] [address]   Protime-INR     Status: Abnormal   Collection Time: 05/18/19  4:07 PM  Result Value Ref Range   Prothrombin Time 19.1 (H) 11.4 - 15.2 seconds   INR 1.6 (H) 0.8 - 1.2    Comment: (NOTE) INR goal varies based on device and disease states. Performed at [facility], [place] [address] [place], [place] [address]   Prepare RBC     Status: None   Collection Time: 05/18/19  4:07 PM  Result Value Ref Range   Order Confirmation      ORDER PROCESSED BY BLOOD  BANK Performed at [facility], [place] [address], [place], [place] [address]   POC SARS Coronavirus 2 Ag-ED - Nasal Swab (BD Veritor Kit)     Status: None   Collection Time: 05/18/19  5:16 PM  Result Value Ref Range   SARS Coronavirus 2 Ag NEGATIVE NEGATIVE    Comment: (NOTE) SARS-CoV-2 antigen NOT DETECTED.  Negative results are presumptive.  Negative results do not preclude SARS-CoV-2 infection and should not be used as the sole basis for treatment or other patient management decisions, including infection  control decisions, particularly in the presence of clinical signs and  symptoms consistent with COVID-19, or in those who have been in contact with the virus.  Negative results must be combined with clinical observations, patient history, and epidemiological information. The expected result is Negative. Fact Sheet for Patients: PodPark.tn Fact Sheet for Healthcare Providers: GiftContent.is This test is not yet approved or cleared by the [place] FDA and  has been authorized for detection and/or diagnosis of SARS-CoV-2 by FDA under an Emergency Use Authorization (EUA).  This EUA will remain in effect (meaning this test can be used) for the duration of  the COVID-19 de claration under Section 564(b)(1) of the Act, 21 U.S.C. section 360bbb-3(b)(1), unless the authorization is terminated or revoked sooner.    DG Chest 2 View  Result Date: 05/18/2019 CLINICAL DATA:  Chest pain EXAM: CHEST - 2 VIEW COMPARISON:  2016 FINDINGS: No new consolidation or edema. Mild scarring  at the left lung base. No pleural effusion or pneumothorax. Stable cardiomediastinal contours. Interval placement of spinal stimulator. IMPRESSION: No acute process in the chest. Electronically Signed   By: Macy Mis M.D.   On: 05/18/2019 14:49    Pending Labs Unresulted Labs (From admission, onward)    Start     Ordered   05/18/19 1556  SARS  CORONAVIRUS 2 (TAT 6-24 HRS) Nasopharyngeal Nasopharyngeal Swab  (Tier 3 (TAT 6-24 hrs))  ONCE - STAT,   STAT    Question Answer Comment  Is this test for diagnosis or screening Screening   Symptomatic for COVID-19 as defined by CDC No   Hospitalized for COVID-19 No   Admitted to ICU for COVID-19 No   Previously tested for COVID-19 Yes   Resident in a congregate (group) care setting No   Employed in healthcare setting No      05/18/19 1555   Signed and Held  CBC  Now then every 12 hours,   R     Signed and Held   Signed and Held  TSH  Add-on,   R     Signed and Held   Signed and Held  Comprehensive metabolic panel  Daily,   R     Signed and Held   Signed and Held  Magnesium  Add-on,   R     Signed and Held   Signed and Held  Reticulocytes  Once,   R     Signed and Held          Vitals/Pain Today's Vitals   05/18/19 1845 05/18/19 1853 05/18/19 1854 05/18/19 2000  BP:  (!) 109/47    Pulse:  80 80   Resp:  16 16   Temp:      TempSrc:      SpO2:  95% 95%   PainSc: 0-No pain   0-No pain    Isolation Precautions No active isolations  Medications Medications  sodium chloride flush (NS) 0.9 % injection 3 mL (has no administration in time range)  0.9 %  sodium chloride infusion (Manually program via Guardrails IV Fluids) (has no administration in time range)  pantoprazole (PROTONIX) injection 40 mg (40 mg Intravenous Given 05/18/19 1800)    Mobility walks Low fall risk   Focused Assessments Neuro Assessment Handoff:  Swallow screen pass?  Cardiac Rhythm: Atrial fibrillation         R Recommendations: See Admitting Provider Note  Report given to: Tama Gander RN   Additional Notes:  Patient is A&Ox 4 and denies c/p at this time; Patient is currently still receiving blood in 20G Right lateral FA IV-Monique,RN

## 2019-05-19 ENCOUNTER — Ambulatory Visit: Payer: Medicare Other

## 2019-05-19 ENCOUNTER — Encounter (HOSPITAL_COMMUNITY)
Admission: EM | Disposition: A | Discharge status: Home Health | Payer: Self-pay | Source: Home / Self Care | Attending: Internal Medicine

## 2019-05-19 ENCOUNTER — Encounter (HOSPITAL_COMMUNITY): Payer: Self-pay | Admitting: Internal Medicine

## 2019-05-19 ENCOUNTER — Inpatient Hospital Stay (HOSPITAL_COMMUNITY): Payer: Medicare Other | Admitting: Certified Registered"

## 2019-05-19 HISTORY — PX: ESOPHAGOGASTRODUODENOSCOPY (EGD) WITH PROPOFOL: SHX5813

## 2019-05-19 LAB — COMPREHENSIVE METABOLIC PANEL
ALT: 16 U/L (ref 0–44)
AST: 20 U/L (ref 15–41)
Albumin: 2.9 g/dL — ABNORMAL LOW (ref 3.5–5.0)
Alkaline Phosphatase: 45 U/L (ref 38–126)
Anion gap: 9 (ref 5–15)
BUN: 35 mg/dL — ABNORMAL HIGH (ref 8–23)
CO2: 25 mmol/L (ref 22–32)
Calcium: 8.6 mg/dL — ABNORMAL LOW (ref 8.9–10.3)
Chloride: 104 mmol/L (ref 98–111)
Creatinine, Ser: 1.58 mg/dL — ABNORMAL HIGH (ref 0.61–1.24)
GFR calc Af Amer: 45 mL/min — ABNORMAL LOW (ref >60)
GFR calc non Af Amer: 39 mL/min — ABNORMAL LOW (ref >60)
Glucose, Bld: 98 mg/dL (ref 70–99)
Potassium: 3.7 mmol/L (ref 3.5–5.1)
Sodium: 138 mmol/L (ref 135–145)
Total Bilirubin: 1.2 mg/dL (ref 0.3–1.2)
Total Protein: 5.1 g/dL — ABNORMAL LOW (ref 6.5–8.1)

## 2019-05-19 LAB — TSH: TSH: 8.29 u[IU]/mL — ABNORMAL HIGH (ref 0.350–4.500)

## 2019-05-19 LAB — CBC
HCT: 22.1 % — ABNORMAL LOW (ref 39.0–52.0)
HCT: 22.9 % — ABNORMAL LOW (ref 39.0–52.0)
HCT: 26.9 % — ABNORMAL LOW (ref 39.0–52.0)
Hemoglobin: 7.2 g/dL — ABNORMAL LOW (ref 13.0–17.0)
Hemoglobin: 7.6 g/dL — ABNORMAL LOW (ref 13.0–17.0)
Hemoglobin: 8.9 g/dL — ABNORMAL LOW (ref 13.0–17.0)
MCH: 32.4 pg (ref 26.0–34.0)
MCH: 32.6 pg (ref 26.0–34.0)
MCH: 32.9 pg (ref 26.0–34.0)
MCHC: 32.6 g/dL (ref 30.0–36.0)
MCHC: 33.1 g/dL (ref 30.0–36.0)
MCHC: 33.2 g/dL (ref 30.0–36.0)
MCV: 100.9 fL — ABNORMAL HIGH (ref 80.0–100.0)
MCV: 97.8 fL (ref 80.0–100.0)
MCV: 98.3 fL (ref 80.0–100.0)
Platelets: 191 10*3/uL (ref 150–400)
Platelets: 198 10*3/uL (ref 150–400)
Platelets: 204 10*3/uL (ref 150–400)
RBC: 2.19 MIL/uL — ABNORMAL LOW (ref 4.22–5.81)
RBC: 2.33 MIL/uL — ABNORMAL LOW (ref 4.22–5.81)
RBC: 2.75 MIL/uL — ABNORMAL LOW (ref 4.22–5.81)
RDW: 15.2 % (ref 11.5–15.5)
RDW: 15.6 % — ABNORMAL HIGH (ref 11.5–15.5)
RDW: 18.2 % — ABNORMAL HIGH (ref 11.5–15.5)
WBC: 6.6 10*3/uL (ref 4.0–10.5)
WBC: 8 10*3/uL (ref 4.0–10.5)
WBC: 8.8 10*3/uL (ref 4.0–10.5)
nRBC: 0 % (ref 0.0–0.2)
nRBC: 0 % (ref 0.0–0.2)
nRBC: 0 % (ref 0.0–0.2)

## 2019-05-19 LAB — RETICULOCYTES
Immature Retic Fract: 31.4 % — ABNORMAL HIGH (ref 2.3–15.9)
RBC.: 2.29 MIL/uL — ABNORMAL LOW (ref 4.22–5.81)
Retic Count, Absolute: 123.7 10*3/uL (ref 19.0–186.0)
Retic Ct Pct: 5.4 % — ABNORMAL HIGH (ref 0.4–3.1)

## 2019-05-19 LAB — MAGNESIUM: Magnesium: 2.8 mg/dL — ABNORMAL HIGH (ref 1.7–2.4)

## 2019-05-19 LAB — PREPARE RBC (CROSSMATCH)

## 2019-05-19 SURGERY — ESOPHAGOGASTRODUODENOSCOPY (EGD) WITH PROPOFOL
Anesthesia: Monitor Anesthesia Care

## 2019-05-19 MED ORDER — PROPOFOL 500 MG/50ML IV EMUL
INTRAVENOUS | Status: DC | PRN
  Administered 2019-05-19: 100 ug/kg/min via INTRAVENOUS

## 2019-05-19 MED ORDER — SODIUM CHLORIDE 0.9 % IV SOLN: INTRAVENOUS | Status: DC

## 2019-05-19 MED ORDER — PEG 3350-KCL-NA BICARB-NACL 420 G PO SOLR
4000.0000 mL | Freq: Once | ORAL | Status: AC
  Administered 2019-05-19: 4000 mL via ORAL
  Filled 2019-05-19: qty 4000

## 2019-05-19 MED ORDER — PROPOFOL 10 MG/ML IV BOLUS
INTRAVENOUS | Status: DC | PRN
  Administered 2019-05-19: 30 mg via INTRAVENOUS

## 2019-05-19 MED ORDER — EPHEDRINE SULFATE-NACL 50-0.9 MG/10ML-% IV SOSY
PREFILLED_SYRINGE | INTRAVENOUS | Status: DC | PRN
  Administered 2019-05-19: 10 mg via INTRAVENOUS

## 2019-05-19 MED ORDER — SODIUM CHLORIDE 0.9% IV SOLUTION: Freq: Once | INTRAVENOUS | Status: AC

## 2019-05-19 SURGICAL SUPPLY — 15 items

## 2019-05-19 NOTE — Interval H&P Note (Signed)
History and Physical Interval Note:  05/19/2019 11:48 AM  Leonard Berg Sr.  has presented today for surgery, with the diagnosis of GI bleed.  The various methods of treatment have been discussed with the patient and family. After consideration of risks, benefits and other options for treatment, the patient has consented to  Procedure(s): ESOPHAGOGASTRODUODENOSCOPY (EGD) WITH PROPOFOL (N/A) as a surgical intervention.  The patient's history has been reviewed, patient examined, no change in status, stable for surgery.  I have reviewed the patient's chart and labs.  Questions were answered to the patient's satisfaction.     Shoni Quijas D

## 2019-05-19 NOTE — Progress Notes (Signed)
PROGRESS NOTE    Leonard SCHMALTZ Sr.  [id number] DOB: [birth year] DOA: 05/18/2019 PCP: [name], NP    Brief Narrative:  Patient is a 86 year old male with history of paroxysmal A. 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  Patient had episode of near syncope at home and gradually has become weaker every day. In the emergency room, hemodynamically stable.  Blood pressure stable.  Hemoglobin down from 14.6-7.2 in 2 months.  Patient was given 1 unit of PRBC and admitted to the hospital.   Assessment & Plan:   Active Problems:   GIB (gastrointestinal bleeding)  Acute blood loss anemia, GI bleeding unknown upper or lower GI: Status post 1 unit PRBC 05/18/2019, hemoglobin not responding appropriately, will transfuse 1 more unit today.  Consent by patient and family. Continue monitoring H&H every 12 hours. Status post EGD today, no bleeding evidence found. Plan for colonoscopy tomorrow. We will continue Protonix by mouth.  Paroxysmal atrial fibrillation: Currently sinus rhythm on amiodarone.  Rate controlled.  Anticoagulation on hold.  Coronary artery disease: Currently without any chest pain.  Aspirin on hold.  Depends upon outcome on lower GI, will resume aspirin.  Chronic anticoagulation and recurrent pulmonary embolism: Now with severe symptomatic anemia this is going to be challenging.  No bridging necessary.  Depends upon endoscopy outcome, anticoagulation will be discussed.   DVT prophylaxis: SCDs Code Status: DNR Family Communication:  GI communicated with patient's son Disposition Plan: patient is from home. Anticipated DC to home with wife, Barriers to discharge active treatment for GI bleeding.   Consultants:   Gastroenterology  Procedures:   EGD, 05/18/2019 normal findings  Antimicrobials:   None   Subjective: Patient seen and examined.  I examined him before going to procedure.  He denies any symptoms.  Poor historian.  Denies any nausea vomiting or abdominal pain.  Objective: Vitals:   05/19/19 1150 05/19/19 1200 05/19/19 1227 05/19/19 1346  BP: (!) 133/45 (!) 129/55  (!) 109/49  Pulse: 82 77 69 65  Resp: 17 14  17   Temp:    98.8 F (37.1 C)  TempSrc:    Oral  SpO2: 94% 99% 98% 96%  Weight:      Height:        Intake/Output Summary (Last 24 hours) at 05/19/2019 1415 Last data filed at 05/19/2019 1348 Gross per 24 hour  Intake 537 ml  Output 1200 ml  Net -663 ml   Filed Weights   05/18/19 2106 05/19/19 0500  Weight: 113.3 kg 113.3 kg    Examination:  General exam: Appears calm and comfortable, on room air. Respiratory system: Clear to auscultation. Respiratory effort normal. Cardiovascular system: S1 & S2 heard, RRR.  Gastrointestinal system: Abdomen is nondistended, soft and nontender. Central nervous system: Alert and oriented x 1-2.  Pleasant with some short-term memory loss.  No focal neurological deficits. Extremities: Symmetric 5 x 5 power. Skin: No rashes, lesions or ulcers Psychiatry: Judgement and insight appear normal. Mood & affect appropriate.     Data Reviewed: I have personally reviewed following labs and imaging studies  CBC: Recent Labs  Lab 05/18/19 1420 05/19/19 0002 05/19/19  1034  WBC 9.8 8.8 6.6  HGB 7.2* 7.6* 7.2*  HCT 22.8* 22.9* 22.1*  MCV 104.6* 98.3 100.9*  PLT 198 191 0000000   Basic Metabolic Panel: Recent Labs  Lab 05/18/19 1420 05/19/19 0002  NA 136 138  K 3.8 3.7  CL 102 104  CO2 23 25  GLUCOSE 156* 98  BUN 37* 35*  CREATININE 1.80* 1.58*    CALCIUM 8.7* 8.6*  MG  --  2.8*   GFR: Estimated Creatinine Clearance: 42.8 mL/min (A) (by C-G formula based on SCr of 1.58 mg/dL (H)). Liver Function Tests: Recent Labs  Lab 05/19/19 0002  AST 20  ALT 16  ALKPHOS 45  BILITOT 1.2  PROT 5.1*  ALBUMIN 2.9*   No results for input(s): LIPASE, AMYLASE in the last 168 hours. No results for input(s): AMMONIA in the last 168 hours. Coagulation Profile: Recent Labs  Lab 05/18/19 1607  INR 1.6*   Cardiac Enzymes: No results for input(s): CKTOTAL, CKMB, CKMBINDEX, TROPONINI in the last 168 hours. BNP (last 3 results) No results for input(s): PROBNP in the last 8760 hours. HbA1C: No results for input(s): HGBA1C in the last 72 hours. CBG: No results for input(s): GLUCAP in the last 168 hours. Lipid Profile: No results for input(s): CHOL, HDL, LDLCALC, TRIG, CHOLHDL, LDLDIRECT in the last 72 hours. Thyroid Function Tests: Recent Labs    05/19/19 0002  TSH 8.290*   Anemia Panel: Recent Labs    05/19/19 0002  RETICCTPCT 5.4*   Sepsis Labs: No results for input(s): PROCALCITON, LATICACIDVEN in the last 168 hours.  Recent Results (from the past 240 hour(s))  SARS CORONAVIRUS 2 (TAT 6-24 HRS) Nasopharyngeal Nasopharyngeal Swab     Status: None   Collection Time: 05/18/19  4:56 PM   Specimen: Nasopharyngeal Swab  Result Value Ref Range Status   SARS Coronavirus 2 NEGATIVE NEGATIVE Final    Comment: (NOTE) SARS-CoV-2 target nucleic acids are NOT DETECTED. The SARS-CoV-2 RNA is generally detectable in upper and lower respiratory specimens during the acute phase of infection. Negative results do not preclude SARS-CoV-2 infection, do not rule out co-infections with other pathogens, and should not be used as the sole basis for treatment or other patient management decisions. Negative results must be combined with clinical observations, patient history, and epidemiological information. The expected result is Negative. Fact  Sheet for Patients: SugarRoll.be Fact Sheet for Healthcare Providers: https://www.woods-mathews.com/ This test is not yet approved or cleared by the [place] FDA and  has been authorized for detection and/or diagnosis of SARS-CoV-2 by FDA under an Emergency Use Authorization (EUA). This EUA will remain  in effect (meaning this test can be used) for the duration of the COVID-19 declaration under Section 56 4(b)(1) of the Act, 21 U.S.C. section 360bbb-3(b)(1), unless the authorization is terminated or revoked sooner. Performed at [facility], [place] [address] [place], [place] [address]          Radiology Studies: DG Chest 2 View  Result Date: 05/18/2019 CLINICAL DATA:  Chest pain EXAM: CHEST - 2 VIEW COMPARISON:  2016 FINDINGS: No new consolidation or edema. Mild scarring at the left lung base. No pleural effusion or pneumothorax. Stable cardiomediastinal contours. Interval placement of spinal stimulator. IMPRESSION: No acute process in the chest. Electronically Signed   By: Macy Mis M.D.   On: 05/18/2019 14:49        Scheduled Meds: . sodium chloride   Intravenous Once  . sodium chloride   Intravenous Once  .  amiodarone  200 mg Oral Daily  . furosemide  40 mg Oral Daily  . levothyroxine  112 mcg Oral QAC breakfast  . lubiprostone  24 mcg Oral BID  . metoprolol succinate  25 mg Oral Daily  . pantoprazole  40 mg Oral BID  . polyethylene glycol-electrolytes  4,000 mL Oral Once  . rosuvastatin  10 mg Oral q1800  . sodium chloride flush  3 mL Intravenous Once  . sodium chloride flush  3 mL Intravenous Q12H  . tamsulosin  0.4 mg Oral QPC supper   Continuous Infusions: . sodium chloride       LOS: 1 day    Time spent: 25 minutes    Leonard Merino, MD [facility] Pager [phone]

## 2019-05-19 NOTE — Op Note (Signed)
[facility] Patient Name: Leonard Berg Procedure Date : 05/19/2019 MRN: [id number] Attending MD: [name] , MD Date of Birth: [birth year] CSN: [id number] Age: 87 Admit Type: Inpatient Procedure:                Upper GI endoscopy Indications:              Heme positive stool, Melena Providers:                [name], MD, Grace Isaac, RN, Elspeth Cho                            Tech., Technician, Lance Coon, CRNA Referring MD:              Medicines:                Propofol per Anesthesia Complications:            No immediate complications. Estimated Blood Loss:     Estimated blood loss: none. Procedure:                Pre-Anesthesia Assessment:                           - Prior to the procedure, a History and Physical                            was performed, and patient medications and                            allergies were reviewed. The patient's tolerance of                            previous anesthesia was also reviewed. The risks                            and benefits of the procedure and the sedation                            options and risks were discussed with the patient.                            All questions were answered, and informed consent                            was obtained. Prior Anticoagulants: The patient has                            taken Xarelto (rivaroxaban), last dose was 2 days                            prior to procedure. ASA Grade Assessment: III - A                            patient with severe systemic disease. After  reviewing the risks and benefits, the patient was                            deemed in satisfactory condition to undergo the                            procedure.                           - Sedation was administered by an anesthesia                            professional. Deep sedation was attained.                           After obtaining informed consent, the  endoscope was                            passed under direct vision. Throughout the                            procedure, the patient's blood pressure, pulse, and                            oxygen saturations were monitored continuously. The                            [id number] Olympus gastroscope was                            introduced through the mouth, and advanced to the                            second part of duodenum. The upper GI endoscopy was                            accomplished without difficulty. The patient                            tolerated the procedure well. Scope In: Scope Out: Findings:      A 3 cm hiatal hernia was present.      The stomach was normal.      The examined duodenum was normal. Impression:               - 3 cm hiatal hernia.                           - Normal stomach.                           - Normal examined duodenum.                           - No specimens collected. Recommendation:           - Return patient to hospital ward for ongoing care.                           -  Clear liquid diet.                           - Continue present medications.                           - Colonoscopy tomorrow. Procedure Code(s):        --- Professional ---                           [phone], Esophagogastroduodenoscopy, flexible,                            transoral; diagnostic, including collection of                            specimen(s) by brushing or washing, when performed                            (separate procedure) Diagnosis Code(s):        --- Professional ---                           R19.5, Other fecal abnormalities                           K44.9, Diaphragmatic hernia without obstruction or                            gangrene                           K92.1, Melena (includes Hematochezia) CPT copyright 2019 [employer]. All rights reserved. The codes documented in this report are preliminary and upon coder review may   be revised to meet current compliance requirements. [name], MD [name], MD 05/19/2019 11:51:58 AM This report has been signed electronically. Number of Addenda: 0

## 2019-05-19 NOTE — Anesthesia Preprocedure Evaluation (Signed)
Anesthesia Evaluation  Patient identified by MRN, date of birth, ID band Patient awake    Reviewed: Allergy & Precautions, NPO status , Patient's Chart, lab work & pertinent test results, reviewed documented beta blocker date and time   Airway Mallampati: II  TM Distance: >3 FB     Dental  (+) Dental Advisory Given   Pulmonary former smoker,    Pulmonary exam normal breath sounds clear to auscultation       Cardiovascular hypertension, Pt. on medications and Pt. on home beta blockers + CAD, + Past MI and + Peripheral Vascular Disease  + dysrhythmias Atrial Fibrillation  Rhythm:Irregular Rate:Normal     Neuro/Psych PSYCHIATRIC DISORDERS Anxiety    GI/Hepatic GERD  ,  Endo/Other  Hypothyroidism   Renal/GU Renal InsufficiencyRenal disease     Musculoskeletal   Abdominal (+) + obese,   Peds  Hematology  (+) Blood dyscrasia, anemia ,   Anesthesia Other Findings    1. Left ventricular ejection fraction, by visual estimation, is 30 to  35%. The left ventricle has moderately decreased function. There is mildly  increased left ventricular hypertrophy. Global hypokinesis.  2. Left ventricular diastolic parameters are indeterminate.  3. Global right ventricle has mildly reduced systolic function.The right  ventricular size is mildly enlarged.  4. Left atrial size was normal.  5. Right atrial size was mildly dilated.  6. The mitral valve is normal in structure. Trace mitral valve  regurgitation.  7. The tricuspid valve is normal in structure. Tricuspid valve  regurgitation is trivial.  8. The aortic valve is tricuspid. Aortic valve regurgitation is not  visualized. Mild aortic valve sclerosis without stenosis.  9. The pulmonic valve was not well visualized. Pulmonic valve  regurgitation is not visualized.  10. There is dilatation of the ascending aorta measuring 39 mm.   FINDINGS    Reproductive/Obstetrics                             Anesthesia Physical  Anesthesia Plan  ASA: III  Anesthesia Plan: MAC   Post-op Pain Management:    Induction:   PONV Risk Score and Plan: Propofol infusion and TIVA  Airway Management Planned: Mask and Natural Airway  Additional Equipment: None  Intra-op Plan:   Post-operative Plan:   Informed Consent: I have reviewed the patients History and Physical, chart, labs and discussed the procedure including the risks, benefits and alternatives for the proposed anesthesia with the patient or authorized representative who has indicated his/her understanding and acceptance.       Plan Discussed with: CRNA  Anesthesia Plan Comments:         Anesthesia Quick Evaluation

## 2019-05-19 NOTE — Anesthesia Postprocedure Evaluation (Signed)
Anesthesia Post Note  Patient: Leonard [name]  Procedure(s) Performed: ESOPHAGOGASTRODUODENOSCOPY (EGD) WITH PROPOFOL (N/A )     Anesthesia Type: MAC Level of consciousness: awake Pain management: pain level controlled Vital Signs Assessment: post-procedure vital signs reviewed and stable Respiratory status: spontaneous breathing Cardiovascular status: stable Postop Assessment: no apparent nausea or vomiting Anesthetic complications: no    Last Vitals:  Vitals:   05/19/19 1150 05/19/19 1200  BP: (!) 133/45 (!) 129/55  Pulse: 82 77  Resp: 17 14  Temp:    SpO2: 94% 99%    Last Pain:  Vitals:   05/19/19 1200  TempSrc:   PainSc: 0-No pain   Pain Goal: Patients Stated Pain Goal: 0 (05/18/19 1409)                 Huston Foley

## 2019-05-19 NOTE — Plan of Care (Signed)

## 2019-05-19 NOTE — Transfer of Care (Signed)
Immediate Anesthesia Transfer of Care Note  Patient: Leonard Berg.  Procedure(s) Performed: ESOPHAGOGASTRODUODENOSCOPY (EGD) WITH PROPOFOL (N/A )  Patient Location: Endoscopy Unit  Anesthesia Type:MAC  Level of Consciousness: drowsy and patient cooperative  Airway & Oxygen Therapy: Patient Spontanous Breathing  Post-op Assessment: Report given to RN and Post -op Vital signs reviewed and stable  Post vital signs: Reviewed and stable  Last Vitals:  Vitals Value Taken Time  BP    Temp    Pulse 95 05/19/19 1141  Resp 22 05/19/19 1141  SpO2 96 % 05/19/19 1141  Vitals shown include unvalidated device data.  Last Pain:  Vitals:   05/19/19 1057  TempSrc: Oral  PainSc: 0-No pain      Patients Stated Pain Goal: 0 (60/60/04 5997)  Complications: No apparent anesthesia complications

## 2019-05-20 ENCOUNTER — Inpatient Hospital Stay (HOSPITAL_COMMUNITY): Payer: Medicare Other | Admitting: Certified Registered Nurse Anesthetist

## 2019-05-20 ENCOUNTER — Encounter (HOSPITAL_COMMUNITY)
Admission: EM | Disposition: A | Discharge status: Home Health | Payer: Self-pay | Source: Home / Self Care | Attending: Internal Medicine

## 2019-05-20 ENCOUNTER — Encounter (HOSPITAL_COMMUNITY): Payer: Self-pay | Admitting: Internal Medicine

## 2019-05-20 ENCOUNTER — Ambulatory Visit: Payer: Medicare Other

## 2019-05-20 DIAGNOSIS — K921 Melena: Secondary | ICD-10-CM

## 2019-05-20 DIAGNOSIS — R195 Other fecal abnormalities: Secondary | ICD-10-CM

## 2019-05-20 DIAGNOSIS — K635 Polyp of colon: Secondary | ICD-10-CM

## 2019-05-20 DIAGNOSIS — D509 Iron deficiency anemia, unspecified: Secondary | ICD-10-CM

## 2019-05-20 HISTORY — PX: GIVENS CAPSULE STUDY: SHX5432

## 2019-05-20 HISTORY — PX: POLYPECTOMY: SHX5525

## 2019-05-20 HISTORY — PX: ESOPHAGOGASTRODUODENOSCOPY: SHX5428

## 2019-05-20 HISTORY — PX: COLONOSCOPY WITH PROPOFOL: SHX5780

## 2019-05-20 LAB — TYPE AND SCREEN
ABO/RH(D): A POS
Antibody Screen: NEGATIVE
Unit division: 0
Unit division: 0

## 2019-05-20 LAB — BPAM RBC
Blood Product Expiration Date: 202102062359
Blood Product Expiration Date: 202102102359
ISSUE DATE / TIME: 202102041826
ISSUE DATE / TIME: 202102051614
Unit Type and Rh: 6200
Unit Type and Rh: 6200

## 2019-05-20 LAB — COMPREHENSIVE METABOLIC PANEL
ALT: 15 U/L (ref 0–44)
AST: 22 U/L (ref 15–41)
Albumin: 2.7 g/dL — ABNORMAL LOW (ref 3.5–5.0)
Alkaline Phosphatase: 42 U/L (ref 38–126)
Anion gap: 8 (ref 5–15)
BUN: 24 mg/dL — ABNORMAL HIGH (ref 8–23)
CO2: 25 mmol/L (ref 22–32)
Calcium: 8.4 mg/dL — ABNORMAL LOW (ref 8.9–10.3)
Chloride: 108 mmol/L (ref 98–111)
Creatinine, Ser: 1.6 mg/dL — ABNORMAL HIGH (ref 0.61–1.24)
GFR calc Af Amer: 44 mL/min — ABNORMAL LOW (ref >60)
GFR calc non Af Amer: 38 mL/min — ABNORMAL LOW (ref >60)
Glucose, Bld: 104 mg/dL — ABNORMAL HIGH (ref 70–99)
Potassium: 4 mmol/L (ref 3.5–5.1)
Sodium: 141 mmol/L (ref 135–145)
Total Bilirubin: 1 mg/dL (ref 0.3–1.2)
Total Protein: 4.9 g/dL — ABNORMAL LOW (ref 6.5–8.1)

## 2019-05-20 LAB — CBC
HCT: 25.4 % — ABNORMAL LOW (ref 39.0–52.0)
Hemoglobin: 8.1 g/dL — ABNORMAL LOW (ref 13.0–17.0)
MCH: 31.9 pg (ref 26.0–34.0)
MCHC: 31.9 g/dL (ref 30.0–36.0)
MCV: 100 fL (ref 80.0–100.0)
Platelets: 196 10*3/uL (ref 150–400)
RBC: 2.54 MIL/uL — ABNORMAL LOW (ref 4.22–5.81)
RDW: 18.6 % — ABNORMAL HIGH (ref 11.5–15.5)
WBC: 6.5 10*3/uL (ref 4.0–10.5)
nRBC: 0 % (ref 0.0–0.2)

## 2019-05-20 LAB — HEMOGLOBIN AND HEMATOCRIT, BLOOD
HCT: 26.9 % — ABNORMAL LOW (ref 39.0–52.0)
Hemoglobin: 8.9 g/dL — ABNORMAL LOW (ref 13.0–17.0)

## 2019-05-20 SURGERY — CANCELLED PROCEDURE

## 2019-05-20 SURGERY — COLONOSCOPY WITH PROPOFOL
Anesthesia: Monitor Anesthesia Care

## 2019-05-20 MED ORDER — PROPOFOL 500 MG/50ML IV EMUL
INTRAVENOUS | Status: DC | PRN
  Administered 2019-05-20: 100 ug/kg/min via INTRAVENOUS

## 2019-05-20 MED ORDER — LACTATED RINGERS IV SOLN: INTRAVENOUS | Status: DC | PRN

## 2019-05-20 MED ORDER — EPHEDRINE SULFATE 50 MG/ML IJ SOLN
INTRAMUSCULAR | Status: DC | PRN
  Administered 2019-05-20 (×3): 10 mg via INTRAVENOUS

## 2019-05-20 MED ORDER — PROPOFOL 10 MG/ML IV BOLUS
INTRAVENOUS | Status: DC | PRN
  Administered 2019-05-20: 20 mg via INTRAVENOUS
  Administered 2019-05-20: 10 mg via INTRAVENOUS

## 2019-05-20 SURGICAL SUPPLY — 22 items

## 2019-05-20 NOTE — Progress Notes (Signed)
Pt. completed 3/4 of the bowel prep. Pt. was advised to use the [facility] and to call for assistance as needed. Will continue to monitor.

## 2019-05-20 NOTE — Op Note (Signed)
[facility] Patient Name: Leonard Berg Procedure Date : 05/20/2019 MRN: [id number] Attending MD: Justice Britain , MD Date of Birth: 07/22/1929 CSN: [id number] Age: 86 Admit Type: Inpatient Procedure:                Upper GI endoscopy Indications:              Iron deficiency anemia, History of anticoagulation Providers:                Justice Britain, MD, Angus Seller, William Dalton, Technician, Theodora Blow, Technician Referring MD:             [name], MD, [facility] Medicines:                Monitored Anesthesia Care Complications:            No immediate complications. Estimated Blood Loss:     Estimated blood loss: none. Procedure:                Pre-Anesthesia Assessment:                           - Prior to the procedure, a History and Physical                            was performed, and patient medications and                            allergies were reviewed. The patient's tolerance of                            previous anesthesia was also reviewed. The risks                            and benefits of the procedure and the sedation                            options and risks were discussed with the patient.                            All questions were answered, and informed consent                            was obtained. Prior Anticoagulants: The patient has                            taken Xarelto (rivaroxaban), last dose was 3 days                            prior to procedure. ASA Grade Assessment: III - A                            patient with severe systemic disease. After  reviewing the risks and benefits, the patient was                            deemed in satisfactory condition to undergo the                            procedure.                           After obtaining informed consent, the endoscope was                            passed under direct vision.  Throughout the                            procedure, the patient's blood pressure, pulse, and                            oxygen saturations were monitored continuously. The                            [id number]-H190 ([id number]) Olympus gastroscope was                            introduced through the mouth, and advanced to the                            second part of duodenum. The upper GI endoscopy was                            accomplished without difficulty. The patient                            tolerated the procedure. Scope In: Scope Out: Findings:      No gross lesions were noted in the entire esophagus.      No gross lesions were noted in the entire examined stomach.      No gross lesions were noted in the duodenal bulb, in the first portion       of the duodenum and in the second portion of the duodenum.      Using the endoscope, the video capsule enteroscope was advanced into the       duodenal bulb. Impression:               - No gross lesions in esophagus.                           - No gross lesions in the stomach.                           - No gross lesions in the duodenal bulb, in the                            first portion of the duodenum and in the second  portion of the duodenum.                           - Successful completion of the Video Capsule                            Enteroscope placement. Recommendation:           - The patient will be observed post-procedure,                            until all discharge criteria are met.                           - Return patient to hospital ward for ongoing care.                           - NPO for 2 hours. Then, clear liquid diet for 2                            hours. Then, may advance diet to full diet                            thereafter.                           - VCE will be picked up tomorrow AM.                           - Observe patient's clinical course.                           - The  findings and recommendations were discussed                            with the patient.                           - The findings and recommendations were discussed                            with the patient's family.                           - The findings and recommendations were discussed                            with the referring physician. Procedure Code(s):        --- Professional ---                           [phone], Esophagogastroduodenoscopy, flexible,                            transoral; diagnostic, including collection of  specimen(s) by brushing or washing, when performed                            (separate procedure) Diagnosis Code(s):        --- Professional ---                           D50.9, Iron deficiency anemia, unspecified CPT copyright 2019 American Medical Association. All rights reserved. The codes documented in this report are preliminary and upon coder review may  be revised to meet current compliance requirements. Justice Britain, MD 05/20/2019 5:38:37 PM Number of Addenda: 0

## 2019-05-20 NOTE — Progress Notes (Signed)
PROGRESS NOTE    Leonard ERTL Sr.  [id number] DOB: 1929-09-03 DOA: 05/18/2019 PCP: [name], NP    Brief Narrative:  Patient is a 86 year old male with history of paroxysmal A. 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  Patient had episode of near syncope at home and gradually has become weaker every day. In the emergency room, hemodynamically stable.  Blood pressure stable.  Hemoglobin down from 14.6-7.2 in 2 months.  Patient was given 1 unit of PRBC and admitted to the hospital.   Assessment & Plan:   Active Problems:   GIB (gastrointestinal bleeding)  Acute blood loss anemia, suspected lower GI bleeding.   PRBC, 05/18/2019 1 unit  PRBC, 05/19/2019 1 unit  Hemoglobin is 8 and appropriately responded.  We will continue to check every 12 hours.  Transfuse for less than 7.   Status post EGD, no active bleeding found.   Prepared for colonoscopy today.  On oral Protonix.   Paroxysmal atrial fibrillation: Currently sinus rhythm on amiodarone.  Rate controlled.  Anticoagulation on hold.  Coronary artery disease: Currently without any chest pain.  Aspirin on hold.  Depends upon outcome on lower GI, will resume aspirin.  Chronic anticoagulation and recurrent pulmonary embolism: Now with severe symptomatic anemia this is going to be challenging.  No bridging necessary.  Depends upon endoscopy outcome, anticoagulation will be discussed.   DVT prophylaxis: SCDs Code Status: DNR Family Communication: GI communicated with patient's son Disposition  Plan: patient is from home. Anticipated DC to home with wife, Barriers to discharge active treatment for GI bleeding.  Plan for colonoscopy today.   Consultants:   Gastroenterology  Procedures:   EGD, 05/18/2019 normal findings  Antimicrobials:   None   Subjective: Patient seen and examined.  No overnight events.  He has taken all of his bowel prep and he thinks he claims to his bowel very well.  Patient still is a still dark.  Denies any nausea vomiting or abdominal pain.  Objective: Vitals:   05/20/19 0500 05/20/19 0500 05/20/19 0824 05/20/19 1031  BP:  (!) 111/47  (!) 137/55  Pulse:  68  68  Resp:  16    Temp:  98.6 F (37 C)    TempSrc:  Oral    SpO2:  96% 92%   Weight: 113.6 kg     Height:        Intake/Output Summary (Last 24 hours) at 05/20/2019 1422 Last data filed at 05/20/2019 0535 Gross per 24 hour  Intake 1126 ml  Output 2350 ml  Net -1224 ml   Filed Weights   05/18/19 2106 05/19/19 0500 05/20/19 0500  Weight: 113.3 kg 113.3 kg 113.6 kg    Examination:  General exam: Appears calm and comfortable, on room air. Respiratory system: Clear to auscultation. Respiratory effort normal. Cardiovascular system: S1 & S2 heard, RRR.  Gastrointestinal system: Abdomen is nondistended, soft and nontender. Central nervous system: Alert and oriented x 2.  Pleasant with some short-term memory loss.  No focal neurological deficits. Extremities: Symmetric 5 x 5 power. Skin: No rashes, lesions or ulcers Psychiatry: Judgement and insight appear normal. Mood & affect  appropriate.     Data Reviewed: I have personally reviewed following labs and imaging studies  CBC: Recent Labs  Lab 05/18/19 1420 05/19/19 0002 05/19/19 1034 05/19/19 2125 05/20/19 1045  WBC 9.8 8.8 6.6 8.0 6.5  HGB 7.2* 7.6* 7.2* 8.9* 8.1*  HCT 22.8* 22.9* 22.1* 26.9* 25.4*  MCV 104.6* 98.3 100.9* 97.8 100.0  PLT 198 191 204 198 [id number]   Basic Metabolic Panel: Recent Labs  Lab 05/18/19 1420  05/19/19 0002 05/20/19 0450  NA 136 138 141  K 3.8 3.7 4.0  CL 102 104 108  CO2 23 25 25   GLUCOSE 156* 98 104*  BUN 37* 35* 24*  CREATININE 1.80* 1.58* 1.60*  CALCIUM 8.7* 8.6* 8.4*  MG  --  2.8*  --    GFR: Estimated Creatinine Clearance: 42.3 mL/min (A) (by C-G formula based on SCr of 1.6 mg/dL (H)). Liver Function Tests: Recent Labs  Lab 05/19/19 0002 05/20/19 0450  AST 20 22  ALT 16 15  ALKPHOS 45 42  BILITOT 1.2 1.0  PROT 5.1* 4.9*  ALBUMIN 2.9* 2.7*   No results for input(s): LIPASE, AMYLASE in the last 168 hours. No results for input(s): AMMONIA in the last 168 hours. Coagulation Profile: Recent Labs  Lab 05/18/19 1607  INR 1.6*   Cardiac Enzymes: No results for input(s): CKTOTAL, CKMB, CKMBINDEX, TROPONINI in the last 168 hours. BNP (last 3 results) No results for input(s): PROBNP in the last 8760 hours. HbA1C: No results for input(s): HGBA1C in the last 72 hours. CBG: No results for input(s): GLUCAP in the last 168 hours. Lipid Profile: No results for input(s): CHOL, HDL, LDLCALC, TRIG, CHOLHDL, LDLDIRECT in the last 72 hours. Thyroid Function Tests: Recent Labs    05/19/19 0002  TSH 8.290*   Anemia Panel: Recent Labs    05/19/19 0002  RETICCTPCT 5.4*   Sepsis Labs: No results for input(s): PROCALCITON, LATICACIDVEN in the last 168 hours.  Recent Results (from the past 240 hour(s))  SARS CORONAVIRUS 2 (TAT 6-24 HRS) Nasopharyngeal Nasopharyngeal Swab     Status: None   Collection Time: 05/18/19  4:56 PM   Specimen: Nasopharyngeal Swab  Result Value Ref Range Status   SARS Coronavirus 2 NEGATIVE NEGATIVE Final    Comment: (NOTE) SARS-CoV-2 target nucleic acids are NOT DETECTED. The SARS-CoV-2 RNA is generally detectable in upper and lower respiratory specimens during the acute phase of infection. Negative results do not preclude SARS-CoV-2 infection, do not rule out co-infections with other pathogens, and should not be used as the sole  basis for treatment or other patient management decisions. Negative results must be combined with clinical observations, patient history, and epidemiological information. The expected result is Negative. Fact Sheet for Patients: SugarRoll.be Fact Sheet for Healthcare Providers: https://www.woods-mathews.com/ This test is not yet approved or cleared by the [place] FDA and  has been authorized for detection and/or diagnosis of SARS-CoV-2 by FDA under an Emergency Use Authorization (EUA). This EUA will remain  in effect (meaning this test can be used) for the duration of the COVID-19 declaration under Section 56 4(b)(1) of the Act, 21 U.S.C. section 360bbb-3(b)(1), unless the authorization is terminated or revoked sooner. Performed at [facility], [place] [address] [place], [place] [address]          Radiology Studies: DG Chest 2 View  Result Date: 05/18/2019 CLINICAL DATA:  Chest pain EXAM: CHEST - 2 VIEW COMPARISON:  2016 FINDINGS: No new consolidation or edema. Mild scarring at the  left lung base. No pleural effusion or pneumothorax. Stable cardiomediastinal contours. Interval placement of spinal stimulator. IMPRESSION: No acute process in the chest. Electronically Signed   By: Macy Mis M.D.   On: 05/18/2019 14:49        Scheduled Meds:  sodium chloride   Intravenous Once   amiodarone  200 mg Oral Daily   furosemide  40 mg Oral Daily   levothyroxine  112 mcg Oral QAC breakfast   lubiprostone  24 mcg Oral BID   metoprolol succinate  25 mg Oral Daily   pantoprazole  40 mg Oral BID   rosuvastatin  10 mg Oral q1800   sodium chloride flush  3 mL Intravenous Q12H   tamsulosin  0.4 mg Oral QPC supper   Continuous Infusions:  sodium chloride 100 mL/hr at 05/20/19 1326     LOS: 2 days    Time spent: 25 minutes    Barb Merino, MD [facility] Pager [phone]

## 2019-05-20 NOTE — Anesthesia Postprocedure Evaluation (Signed)
Anesthesia Post Note  Patient: [name]  Procedure(s) Performed: COLONOSCOPY WITH PROPOFOL (N/A ) GIVENS CAPSULE STUDY (N/A ) POLYPECTOMY ESOPHAGOGASTRODUODENOSCOPY (EGD) (N/A )     Patient location during evaluation: PACU Anesthesia Type: MAC Level of consciousness: awake and alert Pain management: pain level controlled Vital Signs Assessment: post-procedure vital signs reviewed and stable Respiratory status: spontaneous breathing, nonlabored ventilation, respiratory function stable and patient connected to nasal cannula oxygen Cardiovascular status: stable and blood pressure returned to baseline Postop Assessment: no apparent nausea or vomiting Anesthetic complications: no    Last Vitals:  Vitals:   05/20/19 1811 05/20/19 2010  BP: 137/62 (!) 137/53  Pulse: 64 64  Resp: 16 18  Temp: 36.7 C 36.7 C  SpO2: 96% 97%    Last Pain:  Vitals:   05/20/19 2010  TempSrc: Oral  PainSc:                  Maryalyce Sanjuan DAVID

## 2019-05-20 NOTE — Transfer of Care (Signed)
Immediate Anesthesia Transfer of Care Note  Patient: Leonard [name]  Procedure(s) Performed: COLONOSCOPY WITH PROPOFOL (N/A ) GIVENS CAPSULE STUDY (N/A ) POLYPECTOMY ESOPHAGOGASTRODUODENOSCOPY (EGD) (N/A )  Patient Location: Endoscopy Unit  Anesthesia Type:MAC  Level of Consciousness: awake, alert  and oriented  Airway & Oxygen Therapy: Patient Spontanous Breathing  Post-op Assessment: Report given to RN and Post -op Vital signs reviewed and stable  Post vital signs: Reviewed and stable  Last Vitals:  Vitals Value Taken Time  BP 115/61 05/20/19 1729  Temp    Pulse 67 05/20/19 1729  Resp 16 05/20/19 1729  SpO2 99 % 05/20/19 1729  Vitals shown include unvalidated device data.  Last Pain:  Vitals:   05/20/19 1617  TempSrc: Temporal  PainSc: 0-No pain      Patients Stated Pain Goal: 0 (58/00/63 4949)  Complications: No apparent anesthesia complications

## 2019-05-20 NOTE — Anesthesia Preprocedure Evaluation (Signed)
Anesthesia Evaluation  Patient identified by MRN, date of birth, ID band Patient awake    Reviewed: Allergy & Precautions, NPO status , Patient's Chart, lab work & pertinent test results  Airway Mallampati: I  TM Distance: >3 FB Neck ROM: Full    Dental   Pulmonary former smoker,    Pulmonary exam normal        Cardiovascular hypertension, Pt. on medications + CAD, + Past MI and + CABG  Normal cardiovascular exam+ dysrhythmias Atrial Fibrillation      Neuro/Psych Anxiety    GI/Hepatic GERD  Medicated and Controlled,  Endo/Other    Renal/GU      Musculoskeletal   Abdominal   Peds  Hematology   Anesthesia Other Findings   Reproductive/Obstetrics                             Anesthesia Physical Anesthesia Plan  ASA: III  Anesthesia Plan: MAC   Post-op Pain Management:    Induction: Intravenous  PONV Risk Score and Plan: 1 and Ondansetron  Airway Management Planned: Nasal Cannula  Additional Equipment:   Intra-op Plan:   Post-operative Plan:   Informed Consent: I have reviewed the patients History and Physical, chart, labs and discussed the procedure including the risks, benefits and alternatives for the proposed anesthesia with the patient or authorized representative who has indicated his/her understanding and acceptance.       Plan Discussed with: CRNA and Surgeon  Anesthesia Plan Comments:         Anesthesia Quick Evaluation

## 2019-05-20 NOTE — Plan of Care (Signed)

## 2019-05-20 NOTE — Interval H&P Note (Signed)
History and Physical Interval Note:  05/20/2019 4:04 PM  Leonard Back Cannady Sr.  has presented today for surgery, with the diagnosis of GI bleed.  The various methods of treatment have been discussed with the patient and family. After consideration of risks, benefits and other options for treatment, the patient has consented to  Procedure(s): ESOPHAGOGASTRODUODENOSCOPY (EGD) WITH PROPOFOL (N/A) as a surgical intervention.  The patient's history has been reviewed, patient examined, no change in status, stable for surgery.  I have reviewed the patient's chart and labs.  Questions were answered to the patient's satisfaction.   Patient is scheduled for a Colonoscopy.  If this is negative, then we will proceed with repeat EGD and place the VCE in the Duodenum.   [employer]

## 2019-05-20 NOTE — Op Note (Signed)
[facility] Patient Name: Leonard Berg Procedure Date : 05/20/2019 MRN: [id number] Attending MD: Justice Britain , MD Date of Birth: 05-21-1929 CSN: [id number] Age: 86 Admit Type: Inpatient Procedure:                Colonoscopy Indications:              Iron deficiency anemia, Hx of Anticoagulation Providers:                Justice Britain, MD, Angus Seller, William Dalton, Technician, Theodora Blow, Technician Referring MD:             [name], MD, [facility] Medicines:                Monitored Anesthesia Care Complications:            No immediate complications. Estimated Blood Loss:     Estimated blood loss was minimal. Procedure:                Pre-Anesthesia Assessment:                           - Prior to the procedure, a History and Physical                            was performed, and patient medications and                            allergies were reviewed. The patient's tolerance of                            previous anesthesia was also reviewed. The risks                            and benefits of the procedure and the sedation                            options and risks were discussed with the patient.                            All questions were answered, and informed consent                            was obtained. Prior Anticoagulants: The patient has                            taken Xarelto (rivaroxaban), last dose was 3 days                            prior to procedure. ASA Grade Assessment: III - A                            patient with severe systemic disease. After  reviewing the risks and benefits, the patient was                            deemed in satisfactory condition to undergo the                            procedure.                           After obtaining informed consent, the colonoscope                            was passed under direct vision. Throughout  the                            procedure, the patient's blood pressure, pulse, and                            oxygen saturations were monitored continuously. The                            [id number] Olympus colonoscope was                            introduced through the anus and advanced to the 8                            cm into the ileum. The colonoscopy was performed                            without difficulty. The patient tolerated the                            procedure. The quality of the bowel preparation was                            adequate. The terminal ileum, ileocecal valve,                            appendiceal orifice, and rectum were photographed. Scope In: 4:43:41 PM Scope Out: 5:08:12 PM Total Procedure Duration: 0 hours 24 minutes 31 seconds  Findings:      The digital rectal exam findings include hemorrhoids. Pertinent       negatives include no palpable rectal lesions.      The terminal ileum and ileocecal valve appeared normal.      Eight sessile polyps were found in the recto-sigmoid colon (1),       descending colon (1), transverse colon (2), ascending colon (2) and       cecum (2). The polyps were 2 to 4 mm in size. These polyps were removed       with a cold snare. Resection and retrieval were complete.      Multiple small and large-mouthed diverticula were found in the       recto-sigmoid colon, sigmoid colon and descending colon.      Normal mucosa was  found in the entire colon otherwise.      Non-bleeding non-thrombosed internal hemorrhoids were found during       retroflexion, during perianal exam and during digital exam. The       hemorrhoids were Grade II (internal hemorrhoids that prolapse but reduce       spontaneously). Impression:               - Hemorrhoids found on digital rectal exam.                           - The examined portion of the ileum was normal.                           - Eight 2 to 4 mm polyps in the descending  colon,                            in the transverse colon, in the ascending colon and                            in the cecum, removed with a cold snare. Resected                            and retrieved.                           - Diverticulosis in the recto-sigmoid colon, in the                            sigmoid colon and in the descending colon.                           - Normal mucosa in the entire examined colon                            otherwise.                           - Non-bleeding non-thrombosed internal hemorrhoids.                           - No source for anemia on this examination. Recommendation:           - Proceed to EGD with VCE placement.                           - Await pathology results.                           - Repeat colonoscopy is not recommended due to                            current age (77 years or older) for surveillance.                           - The findings and recommendations were discussed  with the patient.                           - The findings and recommendations were discussed                            with the patient's family.                           - The findings and recommendations were discussed                            with the referring physician. Procedure Code(s):        --- Professional ---                           [phone], Colonoscopy, flexible; with removal of                            tumor(s), polyp(s), or other lesion(s) by snare                            technique Diagnosis Code(s):        --- Professional ---                           K64.1, Second degree hemorrhoids                           K63.5, Polyp of colon                           D50.9, Iron deficiency anemia, unspecified                           K57.30, Diverticulosis of large intestine without                            perforation or abscess without bleeding CPT copyright 2019 American Medical Association. All rights  reserved. The codes documented in this report are preliminary and upon coder review may  be revised to meet current compliance requirements. Justice Britain, MD 05/20/2019 5:35:59 PM Number of Addenda: 0

## 2019-05-21 LAB — COMPREHENSIVE METABOLIC PANEL
ALT: 17 U/L (ref 0–44)
AST: 27 U/L (ref 15–41)
Albumin: 2.8 g/dL — ABNORMAL LOW (ref 3.5–5.0)
Alkaline Phosphatase: 52 U/L (ref 38–126)
Anion gap: 9 (ref 5–15)
BUN: 18 mg/dL (ref 8–23)
CO2: 25 mmol/L (ref 22–32)
Calcium: 8.5 mg/dL — ABNORMAL LOW (ref 8.9–10.3)
Chloride: 105 mmol/L (ref 98–111)
Creatinine, Ser: 1.63 mg/dL — ABNORMAL HIGH (ref 0.61–1.24)
GFR calc Af Amer: 43 mL/min — ABNORMAL LOW (ref >60)
GFR calc non Af Amer: 37 mL/min — ABNORMAL LOW (ref >60)
Glucose, Bld: 108 mg/dL — ABNORMAL HIGH (ref 70–99)
Potassium: 4.1 mmol/L (ref 3.5–5.1)
Sodium: 139 mmol/L (ref 135–145)
Total Bilirubin: 0.7 mg/dL (ref 0.3–1.2)
Total Protein: 5 g/dL — ABNORMAL LOW (ref 6.5–8.1)

## 2019-05-21 LAB — HEMOGLOBIN AND HEMATOCRIT, BLOOD
HCT: 25.3 % — ABNORMAL LOW (ref 39.0–52.0)
HCT: 26.1 % — ABNORMAL LOW (ref 39.0–52.0)
Hemoglobin: 8.4 g/dL — ABNORMAL LOW (ref 13.0–17.0)
Hemoglobin: 8.8 g/dL — ABNORMAL LOW (ref 13.0–17.0)

## 2019-05-21 NOTE — Progress Notes (Signed)
PROGRESS NOTE    CERONE HISLE Sr.  [id number] DOB: [birth year] DOA: 05/18/2019 PCP: [name], NP    Brief Narrative:  Patient is a 86 year old male with history of paroxysmal A. 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  Patient had episode of near syncope at home and gradually has become weaker every day. In the emergency room, hemodynamically stable.  Blood pressure stable.  Hemoglobin down from 14.6-7.2 in 2 months.  Patient was given 1 unit of PRBC and admitted to the hospital.   Assessment & Plan:   Active Problems:   GIB (gastrointestinal bleeding)  Acute blood loss anemia, suspected GI bleeding, source unclear:  PRBC, 05/18/2019 1 unit  PRBC, 05/19/2019 1 unit  Hemoglobin is 8 and appropriately responded.  We will continue to check every 12 hours.  Transfuse for less than 7.   Status post EGD, no bleeding lesions or source of bleeding. Status post colonoscopy, no bleeding lesions or source of bleeding. Status post VCE capsule placed with repeat endoscopy 2/6, results pending. Clinically improving.  Hemoglobin is stable off anticoagulation.  Paroxysmal atrial fibrillation: Currently sinus rhythm on amiodarone.  Rate controlled.  Anticoagulation on hold.  Coronary artery disease: Currently without any chest pain.  Aspirin on hold.  Depends upon outcome on lower GI, will resume aspirin.  Chronic anticoagulation and recurrent pulmonary embolism: Now with severe symptomatic anemia this is going to be challenging.  No bridging necessary.   Depends upon endoscopy outcome, anticoagulation will be discussed. We will hold at least for 2 weeks and may need to challenge after that.  DVT prophylaxis: SCDs Code Status: DNR Family Communication: None. Disposition Plan: patient is from home. Anticipated DC to home with wife, Barriers to discharge active treatment for GI bleeding.     Consultants:   Gastroenterology  Procedures:   EGD, 05/18/2019 normal findings  Colonoscopy, 05/20/2019 normal findings  .  Capsule endoscopy with upper GI 05/20/2019, results pending  Antimicrobials:   None   Subjective: Seen and examined.  No overnight events.  He had large loose bowel movement early morning.  Denies any nausea vomiting or abdominal pain.  Objective: Vitals:   05/20/19 1811 05/20/19 2010 05/21/19 0605 05/21/19 0956  BP: 137/62 (!) 137/53 (!) 107/53 117/60  Pulse: 64 64 71 71  Resp: 16 18 18    Temp: 98 F (36.7 C) 98 F (36.7 C) 98.5 F (36.9 C)   TempSrc: Oral Oral Oral   SpO2: 96% 97% 94% 96%  Weight:   99.2 kg   Height:        Intake/Output Summary (Last 24 hours) at 05/21/2019 1118 Last data filed at 05/20/2019 2315 Gross per 24 hour  Intake 750 ml  Output 1250 ml  Net -500 ml   Filed Weights   05/20/19 0500 05/20/19 1617 05/21/19 0605  Weight: 113.6 kg 113.6 kg 99.2 kg    Examination:  General exam: Appears calm and comfortable, on room air. Respiratory system: Clear to auscultation. Respiratory effort normal. Cardiovascular system: S1 & S2 heard, RRR.  Gastrointestinal system: Abdomen is nondistended, soft and nontender. Central nervous system: Alert and oriented. Pleasant with some  short-term memory loss.  No focal neurological deficits. Extremities: Symmetric 5 x 5 power. Skin: No rashes, lesions or ulcers Psychiatry: Judgement and insight appear normal. Mood & affect appropriate.     Data Reviewed: I have personally reviewed following labs and imaging studies  CBC: Recent Labs  Lab 05/18/19  1420 05/18/19 1420 05/19/19 0002 05/19/19 0002 05/19/19 1034 05/19/19 2125 05/20/19 1045 05/20/19 1819 05/21/19 0556  WBC 9.8  --  8.8  --  6.6 8.0 6.5  --   --   HGB 7.2*   < > 7.6*   < > 7.2* 8.9* 8.1* 8.9* 8.4*  HCT 22.8*   < > 22.9*   < > 22.1* 26.9* 25.4* 26.9* 25.3*  MCV 104.6*  --  98.3  --  100.9* 97.8 100.0  --   --   PLT 198  --  191  --  204 198 196  --   --    < > = values in this interval not displayed.   Basic Metabolic Panel: Recent Labs  Lab 05/18/19 1420 05/19/19 0002 05/20/19 0450 05/21/19 0556  NA 136 138 141 139  K 3.8 3.7 4.0 4.1  CL 102 104 108 105  CO2 23 25 25 25   GLUCOSE 156* 98 104* 108*  BUN 37* 35* 24* 18  CREATININE 1.80* 1.58* 1.60* 1.63*  CALCIUM 8.7* 8.6* 8.4* 8.5*  MG  --  2.8*  --   --    GFR: Estimated Creatinine Clearance: 38.9 mL/min (A) (by C-G formula based on SCr of 1.63 mg/dL (H)). Liver Function Tests: Recent Labs  Lab 05/19/19 0002 05/20/19 0450 05/21/19 0556  AST 20 22 27   ALT 16 15 17   ALKPHOS 45 42 52  BILITOT 1.2 1.0 0.7  PROT 5.1* 4.9* 5.0*  ALBUMIN 2.9* 2.7* 2.8*   No results for input(s): LIPASE, AMYLASE in the last 168 hours. No results for input(s): AMMONIA in the last 168 hours. Coagulation Profile: Recent Labs  Lab 05/18/19 1607  INR 1.6*   Cardiac Enzymes: No results for input(s): CKTOTAL, CKMB, CKMBINDEX, TROPONINI in the last 168 hours. BNP (last 3 results) No results for input(s): PROBNP in the last 8760 hours. HbA1C: No results for input(s): HGBA1C in the last 72 hours. CBG: No results for input(s): GLUCAP in the last 168 hours. Lipid Profile: No results for input(s): CHOL, HDL, LDLCALC, TRIG, CHOLHDL, LDLDIRECT in the last 72 hours. Thyroid Function Tests: Recent Labs    05/19/19 0002  TSH 8.290*   Anemia Panel: Recent Labs    05/19/19 0002  RETICCTPCT 5.4*   Sepsis Labs: No results for input(s): PROCALCITON, LATICACIDVEN in the last 168 hours.  Recent Results (from the past  240 hour(s))  SARS CORONAVIRUS 2 (TAT 6-24 HRS) Nasopharyngeal Nasopharyngeal Swab     Status: None   Collection Time: 05/18/19  4:56 PM   Specimen: Nasopharyngeal Swab  Result Value Ref Range Status   SARS Coronavirus 2 NEGATIVE NEGATIVE Final    Comment: (NOTE) SARS-CoV-2 target nucleic acids are NOT DETECTED. The SARS-CoV-2 RNA is generally detectable in upper and lower respiratory specimens during the acute phase of infection. Negative results do not preclude SARS-CoV-2 infection, do not rule out co-infections with other pathogens, and should not be used as the sole basis for treatment or other patient management decisions. Negative results must be combined with clinical observations, patient history, and epidemiological information. The expected result is Negative. Fact Sheet for Patients: SugarRoll.be Fact Sheet for Healthcare Providers: https://www.woods-mathews.com/ This  test is not yet approved or cleared by the [place] and  has been authorized for detection and/or diagnosis of SARS-CoV-2 by FDA under an Emergency Use Authorization (EUA). This EUA will remain  in effect (meaning this test can be used) for the duration of the COVID-19 declaration under Section 56 4(b)(1) of the Act, 21 U.S.C. section 360bbb-3(b)(1), unless the authorization is terminated or revoked sooner. Performed at [facility], [place] [address] [place], [place] [address]          Radiology Studies: No results found.      Scheduled Meds: . sodium chloride   Intravenous Once  . amiodarone  200 mg Oral Daily  . furosemide  40 mg Oral Daily  . levothyroxine  112 mcg Oral QAC breakfast  . lubiprostone  24 mcg Oral BID  . metoprolol succinate  25 mg Oral Daily  . pantoprazole  40 mg Oral BID  . rosuvastatin  10 mg Oral q1800  . sodium chloride flush  3 mL Intravenous Q12H  . tamsulosin  0.4 mg Oral QPC supper   Continuous Infusions:     LOS: 3 days    Time spent: 25 minutes    Barb Merino, MD [facility] Pager [phone]

## 2019-05-21 NOTE — Progress Notes (Signed)
Removed capsule study monitor and placed patient on a clear liquid diet per endoscopy instructions.

## 2019-05-21 NOTE — Progress Notes (Signed)
[place] [facility] team not rounding on patient today. S/P  Colonoscopy and EGD with placement of VCE 05/21/2019. Plan for VCE pick up today with reading to be completed 2/8.

## 2019-05-21 NOTE — Progress Notes (Signed)
Endoscopy capsule passed via patient's rectum.

## 2019-05-22 LAB — HEMOGLOBIN AND HEMATOCRIT, BLOOD
HCT: 25.9 % — ABNORMAL LOW (ref 39.0–52.0)
HCT: 28.2 % — ABNORMAL LOW (ref 39.0–52.0)
Hemoglobin: 8.2 g/dL — ABNORMAL LOW (ref 13.0–17.0)
Hemoglobin: 8.9 g/dL — ABNORMAL LOW (ref 13.0–17.0)

## 2019-05-22 MED ORDER — SODIUM CHLORIDE 0.9 % IV SOLN: INTRAVENOUS | Status: DC

## 2019-05-22 NOTE — Progress Notes (Signed)
PROGRESS NOTE    Leonard WIDRICK Sr.  [id number] DOB: 05-24-1929 DOA: 05/18/2019 PCP: [name], NP    Brief Narrative:  Patient is a 86 year old male with history of paroxysmal A. 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  Patient had episode of near syncope at home and gradually has become weaker every day. In the emergency room, hemodynamically stable.  Blood pressure stable.  Hemoglobin down from 14.6-7.2 in 2 months.  Patient was given 1 unit of PRBC and admitted to the hospital.   Assessment & Plan:   Active Problems:   GIB (gastrointestinal bleeding)  Acute blood loss anemia, suspected GI bleeding, source unclear:  PRBC, 05/18/2019 1 unit  PRBC, 05/19/2019 1 unit  Hemoglobin is 8.2 and appropriately responded.  We will continue to check every 12 hours.   Patient has orthostatic hypotension today and symptomatic dizziness.  Continue monitor. Status post EGD, no bleeding lesions or source of bleeding. Status post colonoscopy, no bleeding lesions or source of bleeding. Status post VCE capsule placed with repeat endoscopy 2/6, results pending. Because of orthostatic dizziness, continue on IV fluids, will check hemoglobin this afternoon and transfuse PRBC if less than 8.  Paroxysmal atrial fibrillation: Currently sinus rhythm on amiodarone.  Rate controlled.  Anticoagulation on hold.  Coronary artery disease: Currently without any chest pain.  Aspirin on hold.    Chronic anticoagulation and recurrent pulmonary embolism: Now with severe  symptomatic anemia. No bridging necessary.  Depends upon endoscopy outcome, anticoagulation will be discussed.   DVT prophylaxis: SCDs Code Status: DNR Family Communication: Patient son [name] called and updated on the phone. Disposition Plan: patient is from home. Anticipated DC to home with wife, Leonard Berg to discharge active treatment for GI bleeding.   Still symptomatic and orthostatic today.   Consultants:   Gastroenterology  Procedures:   EGD, 05/18/2019 normal findings  Colonoscopy, 05/20/2019 normal findings  .  Capsule endoscopy with upper GI 05/20/2019, results pending  Antimicrobials:   None   Subjective: Patient seen and examined in the morning rounds.  No overnight events.  He had no problem in the morning, however with physical therapy his blood pressure dropped to 76 systolic and became dizzy. He had 2 loose bowel movements overnight that were normal in color.  Objective: Vitals:   05/21/19 0956 05/21/19 1621 05/21/19 2108 05/22/19 0508  BP: 117/60 115/60 (!) 116/46 (!) 107/51  Pulse: 71 63 66 66  Resp:  18 17 18   Temp:  98.6 F (37 C) 98.7 F (37.1 C) 98.6 F (37 C)  TempSrc:  Oral Oral Oral  SpO2: 96% 96% 96% 96%  Weight:    98.2 kg  Height:        Intake/Output Summary (Last 24 hours) at 05/22/2019 1355 Last data filed at 05/22/2019 1258 Gross per 24 hour  Intake --  Output 1500 ml  Net -1500 ml   Filed Weights   05/20/19 1617 05/21/19 0605 05/22/19 0508  Weight: 113.6 kg 99.2 kg 98.2 kg    Examination:  General exam: Appears calm and comfortable, on room air. Respiratory system: Clear to auscultation. Respiratory effort normal. Cardiovascular system:  S1 & S2 heard, RRR.  Gastrointestinal system: Abdomen is nondistended, soft and nontender. Central nervous system: Alert and oriented. Pleasant with some short-term memory loss.  No focal neurological deficits. Extremities: Symmetric 5 x 5 power. Skin: No rashes, lesions or  ulcers Psychiatry: Judgement and insight appear normal. Mood & affect appropriate.     Data Reviewed: I have personally reviewed following labs and imaging studies  CBC: Recent Labs  Lab 05/18/19 1420 05/18/19 1420 05/19/19 0002 05/19/19 0002 05/19/19 1034 05/19/19 1034 05/19/19 2125 05/19/19 2125 05/20/19 1045 05/20/19 1819 05/21/19 0556 05/21/19 1645 05/22/19 0500  WBC 9.8  --  8.8  --  6.6  --  8.0  --  6.5  --   --   --   --   HGB 7.2*   < > 7.6*   < > 7.2*   < > 8.9*   < > 8.1* 8.9* 8.4* 8.8* 8.2*  HCT 22.8*   < > 22.9*   < > 22.1*   < > 26.9*   < > 25.4* 26.9* 25.3* 26.1* 25.9*  MCV 104.6*  --  98.3  --  100.9*  --  97.8  --  100.0  --   --   --   --   PLT 198  --  191  --  204  --  198  --  196  --   --   --   --    < > = values in this interval not displayed.   Basic Metabolic Panel: Recent Labs  Lab 05/18/19 1420 05/19/19 0002 05/20/19 0450 05/21/19 0556  NA 136 138 141 139  K 3.8 3.7 4.0 4.1  CL 102 104 108 105  CO2 23 25 25 25   GLUCOSE 156* 98 104* 108*  BUN 37* 35* 24* 18  CREATININE 1.80* 1.58* 1.60* 1.63*  CALCIUM 8.7* 8.6* 8.4* 8.5*  MG  --  2.8*  --   --    GFR: Estimated Creatinine Clearance: 38.7 mL/min (A) (by C-G formula based on SCr of 1.63 mg/dL (H)). Liver Function Tests: Recent Labs  Lab 05/19/19 0002 05/20/19 0450 05/21/19 0556  AST 20 22 27   ALT 16 15 17   ALKPHOS 45 42 52  BILITOT 1.2 1.0 0.7  PROT 5.1* 4.9* 5.0*  ALBUMIN 2.9* 2.7* 2.8*   No results for input(s): LIPASE, AMYLASE in the last 168 hours. No results for input(s): AMMONIA in the last 168 hours. Coagulation Profile: Recent Labs  Lab 05/18/19 1607  INR 1.6*   Cardiac Enzymes: No results for input(s): CKTOTAL, CKMB, CKMBINDEX, TROPONINI in the last 168 hours. BNP (last 3 results) No results for input(s): PROBNP in the last 8760 hours. HbA1C: No results for input(s): HGBA1C in the last 72 hours. CBG: No results for input(s): GLUCAP in the last 168  hours. Lipid Profile: No results for input(s): CHOL, HDL, LDLCALC, TRIG, CHOLHDL, LDLDIRECT in the last 72 hours. Thyroid Function Tests: No results for input(s): TSH, T4TOTAL, FREET4, T3FREE, THYROIDAB in the last 72 hours. Anemia Panel: No results for input(s): VITAMINB12, FOLATE, FERRITIN, TIBC, IRON, RETICCTPCT in the last 72 hours. Sepsis Labs: No results for input(s): PROCALCITON, LATICACIDVEN in the last 168 hours.  Recent Results (from the past 240 hour(s))  SARS CORONAVIRUS 2 (TAT 6-24 HRS) Nasopharyngeal Nasopharyngeal Swab     Status: None   Collection Time: 05/18/19  4:56 PM   Specimen: Nasopharyngeal Swab  Result Value Ref Range Status   SARS Coronavirus 2 NEGATIVE NEGATIVE  Final    Comment: (NOTE) SARS-CoV-2 target nucleic acids are NOT DETECTED. The SARS-CoV-2 RNA is generally detectable in upper and lower respiratory specimens during the acute phase of infection. Negative results do not preclude SARS-CoV-2 infection, do not rule out co-infections with other pathogens, and should not be used as the sole basis for treatment or other patient management decisions. Negative results must be combined with clinical observations, patient history, and epidemiological information. The expected result is Negative. Fact Sheet for Patients: SugarRoll.be Fact Sheet for Healthcare Providers: https://www.woods-mathews.com/ This test is not yet approved or cleared by the [place] FDA and  has been authorized for detection and/or diagnosis of SARS-CoV-2 by FDA under an Emergency Use Authorization (EUA). This EUA will remain  in effect (meaning this test can be used) for the duration of the COVID-19 declaration under Section 56 4(b)(1) of the Act, 21 U.S.C. section 360bbb-3(b)(1), unless the authorization is terminated or revoked sooner. Performed at [facility], [place] [address] [place], [place] [address]          Radiology  Studies: No results found.      Scheduled Meds: . sodium chloride   Intravenous Once  . amiodarone  200 mg Oral Daily  . furosemide  40 mg Oral Daily  . levothyroxine  112 mcg Oral QAC breakfast  . lubiprostone  24 mcg Oral BID  . metoprolol succinate  25 mg Oral Daily  . pantoprazole  40 mg Oral BID  . rosuvastatin  10 mg Oral q1800  . sodium chloride flush  3 mL Intravenous Q12H  . tamsulosin  0.4 mg Oral QPC supper   Continuous Infusions: . sodium chloride 100 mL/hr at 05/22/19 1243     LOS: 4 days    Time spent: 25 minutes    Barb Merino, MD [facility] Pager [phone]

## 2019-05-22 NOTE — Progress Notes (Signed)
Dr. Sloan Leiter notified of below orthostatic BP   05/22/19 1100  Orthostatic Lying   BP- Lying 100/54  Orthostatic Sitting  BP- Sitting (!) 76/48

## 2019-05-22 NOTE — Evaluation (Signed)
Occupational Therapy Evaluation Patient Details Name: Leonard BLEAU Sr. MRN: [id number] DOB: 03/26/1930 Today's Date: 05/22/2019    History of Present Illness Pt is an 86 y/o male admitted secondary to increased weakness and SOB. Found to have symptomatic anemia, likely secondary to GI bleed. Pt is s/p colonoscopy and EGD with placement of VCE. PMH includes a fib, CAD s/p CABG, CHF, PAD, PE, R THA, and R TKA.    Clinical Impression   PTA patient independent using rollator for mobility recently, independent basic ADLs. Admitted for above and limited by problem list below, including impaired balance, decreased activity tolerance, generalized weakness, orthostatic hypotension.  BP assessed throughout session, see below for details.  Pt asymptomatic with mobility initially, became fatigued in standing after approx 30 seconds to 1 minute and requested to sit, once sitting reports dizziness. Able to complete bed mobility with supervision, transfers (sit to stand only) with min assist using RW, ADLs with min assist to supervision.  Discussed safety and ADLs seated.  Will follow acutely, based on performance today recommend HHOT and 24/7 support.   BP: supine 125/60 HR 70         seated 108/57 HR 84        Standing 91/46 HR 100        Standing but sitting after 30sec to 1 min: 84/32 HR 101   Follow Up Recommendations  Home health OT;Supervision/Assistance - 24 hour    Equipment Recommendations  3 in 1 bedside commode    Recommendations for Other Services       Precautions / Restrictions Precautions Precautions: Fall;Other (comment) Precaution Comments: Watch BP  Restrictions Weight Bearing Restrictions: No      Mobility Bed Mobility Overal bed mobility: Needs Assistance Bed Mobility: Supine to Sit;Sit to Supine     Supine to sit: Supervision Sit to supine: Supervision   General bed mobility comments: supervision for safety   Transfers Overall transfer level: Needs  assistance Equipment used: Rolling walker (2 wheeled) Transfers: Sit to/from Stand Sit to Stand: Min assist         General transfer comment: min assist to power up and steady     Balance Overall balance assessment: Needs assistance Sitting-balance support: No upper extremity supported;Feet supported Sitting balance-Leahy Scale: Good Sitting balance - Comments: supervision at EOB    Standing balance support: Bilateral upper extremity supported;During functional activity Standing balance-Leahy Scale: Poor Standing balance comment: relaint on BUE and external support                           ADL either performed or assessed with clinical judgement   ADL Overall ADL's : Needs assistance/impaired     Grooming: Set up;Sitting   Upper Body Bathing: Set up;Sitting   Lower Body Bathing: Minimal assistance;Sit to/from stand   Upper Body Dressing : Set up;Sitting   Lower Body Dressing: Minimal assistance;Sit to/from Health and safety inspector Details (indicate cue type and reason): deferred          Functional mobility during ADLs: Minimal assistance;Rolling walker General ADL Comments: patient limited by weakness and orthostatic BP      Vision   Vision Assessment?: No apparent visual deficits     Perception     Praxis      Pertinent Vitals/Pain Pain Assessment: No/denies pain     Hand Dominance Right   Extremity/Trunk Assessment Upper Extremity Assessment Upper Extremity Assessment: Generalized weakness   Lower  Extremity Assessment Lower Extremity Assessment: Defer to PT evaluation   Cervical / Trunk Assessment Cervical / Trunk Assessment: Normal   Communication Communication Communication: No difficulties   Cognition Arousal/Alertness: Awake/alert Behavior During Therapy: WFL for tasks assessed/performed Overall Cognitive Status: Within Functional Limits for tasks assessed                                     General  Comments  BP monitored throughout session     Exercises     Shoulder Instructions      Home Living Family/patient expects to be discharged to:: Private residence Living Arrangements: Spouse/significant other Available Help at Discharge: Family Type of Home: House Home Access: Stairs to enter Technical brewer of Steps: 3 Entrance Stairs-Rails: Right;Left Home Layout: Two level;Able to live on main level with bedroom/bathroom     Bathroom Shower/Tub: Occupational psychologist: Standard     Home Equipment: Cane - single point;Walker - 4 wheels;Shower seat          Prior Functioning/Environment Level of Independence: Independent with assistive device(s)        Comments: Had been using rollator secondary to increased weakness. 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         OT Problem List: Decreased strength;Decreased activity tolerance;Impaired balance (sitting and/or standing);Decreased knowledge of use of DME or AE;Decreased knowledge of precautions      OT Treatment/Interventions: Self-care/ADL training;Energy conservation;DME and/or AE instruction;Therapeutic activities;Balance training;Patient/family education    OT Goals(Current goals can be found in the care plan section) Acute Rehab OT Goals Patient Stated Goal: to feel better OT Goal Formulation: With patient Time For Goal Achievement: 06/05/19 Potential to Achieve Goals: Good  OT Frequency: Min 2X/week   Barriers to D/C:            Co-evaluation              AM-PAC OT "6 Clicks" Daily Activity     Outcome Measure Help from another person eating meals?: None Help from another person taking care of personal grooming?: A Little Help from another person toileting, which includes using toliet, bedpan, or urinal?: A Little Help from another person bathing (including washing, rinsing, drying)?: A Little Help from another person to put on and taking off  regular upper body clothing?: A Little Help from another person to put on and taking off regular lower body clothing?: A Little 6 Click Score: 19   End of Session Equipment Utilized During Treatment: Gait belt;Rolling walker Nurse Communication: Mobility status;Other (comment)(BP)  Activity Tolerance: Treatment limited secondary to medical complications (Comment)(orthostatic hypotension) Patient left: in bed;with call bell/phone within reach;with bed alarm set  OT Visit Diagnosis: Other abnormalities of gait and mobility (R26.89);Muscle weakness (generalized) (M62.81);Dizziness and giddiness (R42)                Time: [id number] OT Time Calculation (min): 27 min Charges:  OT General Charges $OT Visit: 1 Visit OT Evaluation $OT Eval Moderate Complexity: 1 Mod OT Treatments $Self Care/Home Management : 8-22 mins  Jolaine Artist, OT Acute Rehabilitation Services Pager [phone] Office [phone]   Delight Stare 05/22/2019, 2:44 PM

## 2019-05-22 NOTE — Evaluation (Signed)
Physical Therapy Evaluation Patient Details Name: Leonard Berg Sr. MRN: [id number] DOB: October 03, 1929 Today's Date: 05/22/2019   History of Present Illness  Pt is an 86 y/o male admitted secondary to increased weakness and SOB. Found to have symptomatic anemia, likely secondary to GI bleed. Pt is s/p colonoscopy and EGD with placement of VCE. PMH includes a fib, CAD s/p CABG, CHF, PAD, PE, R THA, and R TKA.   Clinical Impression  Pt admitted secondary to problem above with deficits below. Pt only able to tolerate sitting at EOB, as pt with positive orthostatics and symptomatic. (See below, or vitals flowsheet). Feel pt will progress well once BP improved. Will continue to follow acutely and update recommendations based on pt progression.   Orthostatic Vitals: BP supine:  100/57 mmHg BP sitting: 76/48 mmHg    Follow Up Recommendations Home health PT;Supervision for mobility/OOB(pending mobility progression )    Equipment Recommendations  Other (comment)(TBD)    Recommendations for Other Services       Precautions / Restrictions Precautions Precautions: Fall;Other (comment) Precaution Comments: Watch BP  Restrictions Weight Bearing Restrictions: No      Mobility  Bed Mobility Overal bed mobility: Needs Assistance Bed Mobility: Supine to Sit;Sit to Supine     Supine to sit: Supervision Sit to supine: Supervision   General bed mobility comments: Supervision for safety. Pt reporting increased dizziness and with positive orthostatics, therefore further mobility deferred. (see vitals flowsheet)  Transfers                    Ambulation/Gait                Stairs            Wheelchair Mobility    Modified Rankin (Stroke Patients Only)       Balance Overall balance assessment: Needs assistance Sitting-balance support: No upper extremity supported;Feet supported Sitting balance-Leahy Scale: Fair                                        Pertinent Vitals/Pain Pain Assessment: No/denies pain    Home Living Family/patient expects to be discharged to:: Private residence Living Arrangements: Spouse/significant other Available Help at Discharge: Family Type of Home: House Home Access: Stairs to enter Entrance Stairs-Rails: Psychiatric nurse of Steps: 3 Home Layout: Two level;Able to live on main level with bedroom/bathroom Home Equipment: Kasandra Knudsen - single point;Walker - 4 wheels      Prior Function Level of Independence: Independent with assistive device(s)         Comments: Had been using rollator secondary to increased weakness. 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      Hand Dominance        Extremity/Trunk Assessment   Upper Extremity Assessment Upper Extremity Assessment: Generalized weakness    Lower Extremity Assessment Lower Extremity Assessment: Generalized weakness    Cervical / Trunk Assessment Cervical / Trunk Assessment: Normal  Communication   Communication: No difficulties  Cognition Arousal/Alertness: Awake/alert Behavior During Therapy: WFL for tasks assessed/performed Overall Cognitive Status: Within Functional Limits for tasks assessed                                        General Comments General comments (skin integrity, edema, etc.): Pt's  BP in supine at 100/54 mmHg; in sitting was 76/48 mmHg.     Exercises     Assessment/Plan    PT Assessment Patient needs continued PT services  PT Problem List Decreased strength;Decreased balance;Decreased activity tolerance;Decreased mobility;Decreased knowledge of use of DME;Decreased knowledge of precautions;Cardiopulmonary status limiting activity       PT Treatment Interventions Gait training;DME instruction;Stair training;Therapeutic activities;Functional mobility training;Therapeutic exercise;Balance training;Patient/family education    PT Goals (Current goals  can be found in the Care Plan section)  Acute Rehab PT Goals Patient Stated Goal: to feel better PT Goal Formulation: With patient Time For Goal Achievement: 06/05/19 Potential to Achieve Goals: Good    Frequency Min 3X/week   Barriers to discharge        Co-evaluation               AM-PAC PT "6 Clicks" Mobility  Outcome Measure Help needed turning from your back to your side while in a flat bed without using bedrails?: None Help needed moving from lying on your back to sitting on the side of a flat bed without using bedrails?: None Help needed moving to and from a bed to a chair (including a wheelchair)?: A Little Help needed standing up from a chair using your arms (e.g., wheelchair or bedside chair)?: A Little Help needed to walk in hospital room?: A Lot Help needed climbing 3-5 steps with a railing? : A Lot 6 Click Score: 18    End of Session   Activity Tolerance: Treatment limited secondary to medical complications (Comment)(+ orthostatics) Patient left: in bed;with call bell/phone within reach Nurse Communication: Mobility status;Other (comment)(orthostatics) PT Visit Diagnosis: Muscle weakness (generalized) (M62.81);Other abnormalities of gait and mobility (R26.89);Difficulty in walking, not elsewhere classified (R26.2)    Time: [id number] PT Time Calculation (min) (ACUTE ONLY): 18 min   Charges:   PT Evaluation $PT Eval Moderate Complexity: 1 Mod          Reuel Derby, PT, DPT  Acute Rehabilitation Services  Pager: [phone] Office: [phone]   Rudean Hitt 05/22/2019, 11:38 AM

## 2019-05-22 NOTE — H&P (View-Only) (Signed)
UNASSIGNED PATIENT  Subjective: [name] is a 86 year old white male with a history of paroxysmal atrial fibrillation status post cardioversion in December 2020 on Xarelto along with a history of coronary artery disease status post CABG in [id number], chronic systolic heart failure with a EF of 35% was admitted with blood in the stool and significant drop in his hemoglobin.  He had an EGD done on 05/19/2019 that revealed 3 cm hiatal hernia but otherwise normal exam and a repeat EGD done on 05/19/2018 that revealed no abnormalities in the upper GI tract and a capsule was placed in the duodenal bulb.  A colonoscopy done on 05/19/2018 revealed diverticulosis and multiple small polyps were removed.  I read the capsule today and noted a few AVMs in the post bulbar region. The rest of the small bowel up to the cecum appeared normal.  Patient has received 1 unit of packed red blood cells on 05/18/2019 and another unit of packed red blood cells on 05/19/2019.  Objective: Vital signs in last 24 hours: Temp:  [98.6 F (37 C)-98.7 F (37.1 C)] 98.6 F (37 C) (02/08 0508) Pulse Rate:  [63-66] 66 (02/08 0508) Resp:  [17-18] 18 (02/08 0508) BP: (107-116)/(46-60) 107/51 (02/08 0508) SpO2:  [96 %] 96 % (02/08 0508) Weight:  [98.2 kg] 98.2 kg (02/08 0508) Last BM Date: 05/21/19  Intake/Output from previous day: 02/07 0701 - 02/08 0700 In: -  Out: 1225 [Urine:1225] Intake/Output this shift: Total I/O In: -  Out: 400 [Urine:400]  General appearance: alert, cooperative, appears stated age, fatigued, no distress and pale Resp: clear to auscultation bilaterally Cardio: regularly rate and rhythm GI: soft, non-tender; bowel sounds normal; no masses,  no organomegaly Extremities: extremities normal, atraumatic, no cyanosis or edema  Lab Results: Recent Labs    05/19/19 2125 05/19/19 2125 05/20/19 1045 05/20/19 1819 05/21/19 0556 05/21/19 1645 05/22/19 0500  WBC 8.0  --  6.5  --   --   --   --   HGB  8.9*   < > 8.1*   < > 8.4* 8.8* 8.2*  HCT 26.9*   < > 25.4*   < > 25.3* 26.1* 25.9*  PLT 198  --  196  --   --   --   --    < > = values in this interval not displayed.   BMET Recent Labs    05/20/19 0450 05/21/19 0556  NA 141 139  K 4.0 4.1  CL 108 105  CO2 25 25  GLUCOSE 104* 108*  BUN 24* 18  CREATININE 1.60* 1.63*  CALCIUM 8.4* 8.5*   LFT Recent Labs    05/21/19 0556  PROT 5.0*  ALBUMIN 2.8*  AST 27  ALT 17  ALKPHOS 52  BILITOT 0.7   Medications: I have reviewed the patient's current medications.  Assessment/Plan: 1) Iron deficiency anemia secondary to GI bleeding-complicating a recent history of pulmonary embolism/paroxysmal atrial fibrillation/CAD-an enteroscopy is planned tomorrow with ablation of the AVMs.  Further recommendation made thereafter. 2) Colonic diverticulosis with colonic polyps. 3) Chronic constipation,   LOS: 4 days   Juanita Craver 05/22/2019, 4:07 PM

## 2019-05-22 NOTE — Plan of Care (Signed)

## 2019-05-22 NOTE — Progress Notes (Signed)
UNASSIGNED PATIENT  Subjective: [name] is a 86 year old white male with a history of paroxysmal atrial fibrillation status post cardioversion in December 2020 on Xarelto along with a history of coronary artery disease status post CABG in [id number], chronic systolic heart failure with a EF of 35% was admitted with blood in the stool and significant drop in his hemoglobin.  He had an EGD done on 05/19/2019 that revealed 3 cm hiatal hernia but otherwise normal exam and a repeat EGD done on 05/19/2018 that revealed no abnormalities in the upper GI tract and a capsule was placed in the duodenal bulb.  A colonoscopy done on 05/19/2018 revealed diverticulosis and multiple small polyps were removed.  I read the capsule today and noted a few AVMs in the post bulbar region. The rest of the small bowel up to the cecum appeared normal.  Patient has received 1 unit of packed red blood cells on 05/18/2019 and another unit of packed red blood cells on 05/19/2019.  Objective: Vital signs in last 24 hours: Temp:  [98.6 F (37 C)-98.7 F (37.1 C)] 98.6 F (37 C) (02/08 0508) Pulse Rate:  [63-66] 66 (02/08 0508) Resp:  [17-18] 18 (02/08 0508) BP: (107-116)/(46-60) 107/51 (02/08 0508) SpO2:  [96 %] 96 % (02/08 0508) Weight:  [98.2 kg] 98.2 kg (02/08 0508) Last BM Date: 05/21/19  Intake/Output from previous day: 02/07 0701 - 02/08 0700 In: -  Out: 1225 [Urine:1225] Intake/Output this shift: Total I/O In: -  Out: 400 [Urine:400]  General appearance: alert, cooperative, appears stated age, fatigued, no distress and pale Resp: clear to auscultation bilaterally Cardio: regularly rate and rhythm GI: soft, non-tender; bowel sounds normal; no masses,  no organomegaly Extremities: extremities normal, atraumatic, no cyanosis or edema  Lab Results: Recent Labs    05/19/19 2125 05/19/19 2125 05/20/19 1045 05/20/19 1819 05/21/19 0556 05/21/19 1645 05/22/19 0500  WBC 8.0  --  6.5  --   --   --   --   HGB  8.9*   < > 8.1*   < > 8.4* 8.8* 8.2*  HCT 26.9*   < > 25.4*   < > 25.3* 26.1* 25.9*  PLT 198  --  196  --   --   --   --    < > = values in this interval not displayed.   BMET Recent Labs    05/20/19 0450 05/21/19 0556  NA 141 139  K 4.0 4.1  CL 108 105  CO2 25 25  GLUCOSE 104* 108*  BUN 24* 18  CREATININE 1.60* 1.63*  CALCIUM 8.4* 8.5*   LFT Recent Labs    05/21/19 0556  PROT 5.0*  ALBUMIN 2.8*  AST 27  ALT 17  ALKPHOS 52  BILITOT 0.7   Medications: I have reviewed the patient's current medications.  Assessment/Plan: 1) Iron deficiency anemia secondary to GI bleeding-complicating a recent history of pulmonary embolism/paroxysmal atrial fibrillation/CAD-an enteroscopy is planned tomorrow with ablation of the AVMs.  Further recommendation made thereafter. 2) Colonic diverticulosis with colonic polyps. 3) Chronic constipation,   LOS: 4 days   Juanita Craver 05/22/2019, 4:07 PM

## 2019-05-23 ENCOUNTER — Inpatient Hospital Stay (HOSPITAL_COMMUNITY): Payer: Medicare Other | Admitting: Certified Registered"

## 2019-05-23 ENCOUNTER — Encounter: Payer: Self-pay | Admitting: Gastroenterology

## 2019-05-23 ENCOUNTER — Encounter (HOSPITAL_COMMUNITY)
Admission: EM | Disposition: A | Discharge status: Home Health | Payer: Self-pay | Source: Home / Self Care | Attending: Internal Medicine

## 2019-05-23 ENCOUNTER — Encounter (HOSPITAL_COMMUNITY): Payer: Self-pay | Admitting: Internal Medicine

## 2019-05-23 HISTORY — PX: ENTEROSCOPY: SHX5533

## 2019-05-23 HISTORY — PX: HOT HEMOSTASIS: SHX5433

## 2019-05-23 LAB — SURGICAL PATHOLOGY

## 2019-05-23 LAB — HEMOGLOBIN AND HEMATOCRIT, BLOOD
HCT: 24.9 % — ABNORMAL LOW (ref 39.0–52.0)
HCT: 28.7 % — ABNORMAL LOW (ref 39.0–52.0)
Hemoglobin: 8 g/dL — ABNORMAL LOW (ref 13.0–17.0)
Hemoglobin: 9.2 g/dL — ABNORMAL LOW (ref 13.0–17.0)

## 2019-05-23 SURGERY — ENTEROSCOPY
Anesthesia: Monitor Anesthesia Care

## 2019-05-23 MED ORDER — LIDOCAINE 2% (20 MG/ML) 5 ML SYRINGE
INTRAMUSCULAR | Status: DC | PRN
  Administered 2019-05-23: 50 mg via INTRAVENOUS

## 2019-05-23 MED ORDER — PROPOFOL 10 MG/ML IV BOLUS
INTRAVENOUS | Status: DC | PRN
  Administered 2019-05-23: 40 mg via INTRAVENOUS
  Administered 2019-05-23 (×2): 30 mg via INTRAVENOUS

## 2019-05-23 MED ORDER — GLYCOPYRROLATE PF 0.2 MG/ML IJ SOSY
PREFILLED_SYRINGE | INTRAMUSCULAR | Status: DC | PRN
  Administered 2019-05-23: .1 mg via INTRAVENOUS

## 2019-05-23 MED ORDER — PROPOFOL 500 MG/50ML IV EMUL
INTRAVENOUS | Status: DC | PRN
  Administered 2019-05-23 (×2): 75 ug/kg/min via INTRAVENOUS

## 2019-05-23 MED ORDER — SPOT INK MARKER SYRINGE KIT
PACK | SUBMUCOSAL | Status: AC
  Filled 2019-05-23: qty 5

## 2019-05-23 MED ORDER — SODIUM CHLORIDE 0.9 % IV SOLN: INTRAVENOUS | Status: DC | PRN

## 2019-05-23 NOTE — TOC Initial Note (Signed)
Transition of Care (TOC) - Initial/Assessment Note    Patient Details  Name: Leonard LANGLITZ Sr. MRN: [id number] Date of Birth: 05/31/1929  [facility]) CM/SW Contact:    Marilu Favre, RN Phone Number: 05/23/2019, 12:54 PM  Clinical Narrative:                  Spoke to patient and wife at bedside. Patient has 3 in 1 and walker at home already.   PT recommending home health. Provided choice. Patient has no preference, has had [facility] in past. Arranged  with [facility]. Expected Discharge Plan: [place] Barriers to Discharge: Continued Medical Work up   Patient Goals and CMS Choice Patient states their goals for this hospitalization and ongoing recovery are:: to return to home CMS Medicare.gov Compare Post Acute Care list provided to:: Patient Choice offered to / list presented to : Patient  Expected Discharge Plan and Services Expected Discharge Plan: [place]   Discharge Planning Services: CM Consult Post Acute Care Choice: [place] arrangements for the past 2 months: Single Family Home                 DME Arranged: N/A         HH Arranged: NA          Prior Living Arrangements/Services Living arrangements for the past 2 months: Single Family Home Lives with:: Spouse Patient language and need for interpreter reviewed:: Yes Do you feel safe going back to the place where you live?: Yes      Need for Family Participation in Patient Care: Yes (Comment) Care giver support system in place?: Yes (comment) Current home services: Home OT, Home PT Criminal Activity/Legal Involvement Pertinent to Current Situation/Hospitalization: No - Comment as needed  Activities of Daily Living Home Assistive Devices/Equipment: None ADL Screening (condition at time of admission) Patient's cognitive ability adequate to safely complete daily activities?: Yes Is the patient deaf or  have difficulty hearing?: No Does the patient have difficulty seeing, even when wearing glasses/contacts?: No Does the patient have difficulty concentrating, remembering, or making decisions?: No Patient able to express need for assistance with ADLs?: Yes Does the patient have difficulty dressing or bathing?: No Independently performs ADLs?: Yes (appropriate for developmental age) Does the patient have difficulty walking or climbing stairs?: No Weakness of Legs: None Weakness of Arms/Hands: None  Permission Sought/Granted   Permission granted to share information with : Yes, Verbal Permission Granted  Share Information with NAME: wife Hassan Rowan           Emotional Assessment Appearance:: Appears stated age Attitude/Demeanor/Rapport: Engaged Affect (typically observed): Accepting Orientation: : Oriented to Self, Oriented to Place, Oriented to  Time, Oriented to Situation Alcohol / Substance Use: Not Applicable    Admission diagnosis:  Weakness [R53.1] GIB (gastrointestinal bleeding) [K92.2] Gastrointestinal hemorrhage, unspecified gastrointestinal hemorrhage type [K92.2] Patient Active Problem List   Diagnosis Date Noted  . GIB (gastrointestinal bleeding) 05/18/2019  . Constipation 04/19/2018  . Medicare annual wellness visit, subsequent 11/19/2015  . Spondylolisthesis of lumbar region 08/08/2015  . Renal failure 05/23/2015  . Pulmonary embolism ([place]) 06/03/2014  . Occlusion and stenosis of carotid artery without mention of cerebral infarction 01/27/2012  . Renal artery atherosclerosis ([place]) 07/16/2010  . CAROTID BRUIT 02/11/2010  . Mixed hyperlipidemia 12/17/2009  . Hypothyroidism 12/12/2009  . ANEMIA 12/12/2009  . THROMBOCYTOPENIA 12/12/2009  . Essential hypertension 12/12/2009  .  UNSTABLE ANGINA 12/12/2009  . Coronary atherosclerosis of native coronary artery 12/12/2009  . RIGHT BUNDLE BRANCH BLOCK 12/12/2009  . PAROXYSMAL ATRIAL FIBRILLATION 12/12/2009  .  Osteoarthritis 12/12/2009  . BPH (benign prostatic hyperplasia) 12/12/2009   PCP:  [name], NP Pharmacy:   [facility], [place] [place] [address] Phone: [phone] Fax: [phone]     Social Determinants of Health (SDOH) Interventions    Readmission Risk Interventions No flowsheet data found.

## 2019-05-23 NOTE — Transfer of Care (Signed)
Immediate Anesthesia Transfer of Care Note  Patient: Leonard Berg.  Procedure(s) Performed: ENTEROSCOPY (N/A ) HOT HEMOSTASIS (ARGON PLASMA COAGULATION/BICAP) (N/A )  Patient Location: Endoscopy Unit  Anesthesia Type:MAC  Level of Consciousness: drowsy  Airway & Oxygen Therapy: Patient Spontanous Breathing and Patient connected to nasal cannula oxygen  Post-op Assessment: Report given to RN and Post -op Vital signs reviewed and stable  Post vital signs: Reviewed and stable  Last Vitals:  Vitals Value Taken Time  BP 95/36 05/23/19 1426  Temp    Pulse 73 05/23/19 1426  Resp 30 05/23/19 1426  SpO2 99 % 05/23/19 1426  Vitals shown include unvalidated device data.  Last Pain:  Vitals:   05/23/19 1330  TempSrc: Oral  PainSc: 0-No pain      Patients Stated Pain Goal: 0 (57/50/51 8335)  Complications: No apparent anesthesia complications

## 2019-05-23 NOTE — Progress Notes (Signed)
PROGRESS NOTE    YORDI PANKRATZ Sr.  [id number] DOB: 05/19/1929 DOA: 05/18/2019 PCP: [name], NP    Brief Narrative:  Patient is a 86 year old male with history of paroxysmal A. 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  Patient had episode of near syncope at home and gradually has become weaker every day. In the emergency room, hemodynamically stable.  Blood pressure stable.  Hemoglobin down from 14.6-7.2 in 2 months.  Patient was given 1 unit of PRBC and admitted to the hospital.   Assessment & Plan:   Active Problems:   GIB (gastrointestinal bleeding)  Acute blood loss anemia, suspected GI bleeding, source unclear:  PRBC, 05/18/2019 1 unit  PRBC, 05/19/2019 1 unit  Hemoglobin is 8.2-8.9-8. He has appropriately responded.  We will continue to check every 12 hours.   Patient had orthostatic hypotension and now on maintenance iv fluids. Continue monitor. Status post EGD, no bleeding lesions or source of bleeding. Status post colonoscopy, no bleeding lesions or source of bleeding. 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  Repeat EGD/ Enteroscopy today.   Paroxysmal atrial fibrillation: Currently sinus rhythm on amiodarone.  Rate controlled.  Anticoagulation on hold.  Coronary artery disease: Currently without any chest pain.  Aspirin on hold.    Chronic anticoagulation and recurrent pulmonary embolism: Now with severe symptomatic anemia. No bridging necessary.  Depends upon endoscopy outcome, anticoagulation will be  discussed.   DVT prophylaxis: SCDs Code Status: DNR Family Communication: Patient son [name] called and updated on the phone 2/8. Will call with procedure findings. Disposition Plan: patient is from home. Anticipated DC to home with wife, Barriers to discharge active treatment for GI bleeding.   Still symptomatic and orthostatic and plan for procedure today.   Consultants:   Gastroenterology  Procedures:   EGD, 05/18/2019 normal findings  Colonoscopy, 05/20/2019 normal findings  .  Capsule endoscopy with upper GI 05/20/2019, multiple AVMs.  Antimicrobials:   None   Subjective: Patient seen and examined.  No overnight events.  He thought his procedure is at 730 and he has not gone yet.  No other complaints. He was able to sit without dizziness, he has not been out of bed so he is not sure he will pass out or not.  Blood pressures remained stable.  Orthostatic gap is closing.  Objective: Vitals:   05/22/19 1627 05/22/19 2203 05/23/19 0628 05/23/19 0917  BP: (!) 106/51 (!) 112/51 (!) 125/54 (!) 122/56  Pulse: 83 78 66 68  Resp: 18 18 16    Temp: 98.4 F (36.9 C) 98.8 F (37.1 C) 98.7 F (37.1 C)   TempSrc: Oral Oral Oral   SpO2: 94% 97% 100%   Weight:      Height:        Intake/Output Summary (Last 24 hours) at 05/23/2019 1114 Last data filed at 05/23/2019 0630 Gross per 24 hour  Intake 684.55 ml  Output 1520 ml  Net -835.45 ml   Filed Weights   05/20/19 1617 05/21/19 0605 05/22/19 0508  Weight: 113.6 kg 99.2 kg 98.2 kg    Examination:  General exam: Appears calm and  comfortable, on room air. Respiratory system: Clear to auscultation. Respiratory effort normal. Cardiovascular system: S1 & S2 heard, RRR.  Gastrointestinal system: Abdomen is nondistended, soft and nontender. Central nervous system: Alert and oriented. Pleasant with some short-term memory loss.  No focal neurological deficits. Extremities: Symmetric 5 x 5 power. Skin: No rashes, lesions or  ulcers Psychiatry: Judgement and insight appear normal. Mood & affect appropriate.     Data Reviewed: I have personally reviewed following labs and imaging studies  CBC: Recent Labs  Lab 05/18/19 1420 05/18/19 1420 05/19/19 0002 05/19/19 0002 05/19/19 1034 05/19/19 1034 05/19/19 2125 05/19/19 2125 05/20/19 1045 05/20/19 1819 05/21/19 0556 05/21/19 1645 05/22/19 0500 05/22/19 1633 05/23/19 0505  WBC 9.8  --  8.8  --  6.6  --  8.0  --  6.5  --   --   --   --   --   --   HGB 7.2*   < > 7.6*   < > 7.2*   < > 8.9*   < > 8.1*   < > 8.4* 8.8* 8.2* 8.9* 8.0*  HCT 22.8*   < > 22.9*   < > 22.1*   < > 26.9*   < > 25.4*   < > 25.3* 26.1* 25.9* 28.2* 24.9*  MCV 104.6*  --  98.3  --  100.9*  --  97.8  --  100.0  --   --   --   --   --   --   PLT 198  --  191  --  204  --  198  --  196  --   --   --   --   --   --    < > = values in this interval not displayed.   Basic Metabolic Panel: Recent Labs  Lab 05/18/19 1420 05/19/19 0002 05/20/19 0450 05/21/19 0556  NA 136 138 141 139  K 3.8 3.7 4.0 4.1  CL 102 104 108 105  CO2 23 25 25 25   GLUCOSE 156* 98 104* 108*  BUN 37* 35* 24* 18  CREATININE 1.80* 1.58* 1.60* 1.63*  CALCIUM 8.7* 8.6* 8.4* 8.5*  MG  --  2.8*  --   --    GFR: Estimated Creatinine Clearance: 38.7 mL/min (A) (by C-G formula based on SCr of 1.63 mg/dL (H)). Liver Function Tests: Recent Labs  Lab 05/19/19 0002 05/20/19 0450 05/21/19 0556  AST 20 22 27   ALT 16 15 17   ALKPHOS 45 42 52  BILITOT 1.2 1.0 0.7  PROT 5.1* 4.9* 5.0*  ALBUMIN 2.9* 2.7* 2.8*   No results for input(s): LIPASE, AMYLASE in the last 168 hours. No results for input(s): AMMONIA in the last 168 hours. Coagulation Profile: Recent Labs  Lab 05/18/19 1607  INR 1.6*   Cardiac Enzymes: No results for input(s): CKTOTAL, CKMB, CKMBINDEX, TROPONINI in the last 168 hours. BNP (last 3 results) No results for input(s): PROBNP in the last 8760 hours. HbA1C: No results for input(s): HGBA1C in  the last 72 hours. CBG: No results for input(s): GLUCAP in the last 168 hours. Lipid Profile: No results for input(s): CHOL, HDL, LDLCALC, TRIG, CHOLHDL, LDLDIRECT in the last 72 hours. Thyroid Function Tests: No results for input(s): TSH, T4TOTAL, FREET4, T3FREE, THYROIDAB in the last 72 hours. Anemia Panel: No results for input(s): VITAMINB12, FOLATE, FERRITIN, TIBC, IRON, RETICCTPCT in the last 72 hours. Sepsis Labs: No results for input(s): PROCALCITON, LATICACIDVEN in the last 168 hours.  Recent Results (from  the past 240 hour(s))  SARS CORONAVIRUS 2 (TAT 6-24 HRS) Nasopharyngeal Nasopharyngeal Swab     Status: None   Collection Time: 05/18/19  4:56 PM   Specimen: Nasopharyngeal Swab  Result Value Ref Range Status   SARS Coronavirus 2 NEGATIVE NEGATIVE Final    Comment: (NOTE) SARS-CoV-2 target nucleic acids are NOT DETECTED. The SARS-CoV-2 RNA is generally detectable in upper and lower respiratory specimens during the acute phase of infection. Negative results do not preclude SARS-CoV-2 infection, do not rule out co-infections with other pathogens, and should not be used as the sole basis for treatment or other patient management decisions. Negative results must be combined with clinical observations, patient history, and epidemiological information. The expected result is Negative. Fact Sheet for Patients: SugarRoll.be Fact Sheet for Healthcare Providers: https://www.woods-mathews.com/ This test is not yet approved or cleared by the [place] FDA and  has been authorized for detection and/or diagnosis of SARS-CoV-2 by FDA under an Emergency Use Authorization (EUA). This EUA will remain  in effect (meaning this test can be used) for the duration of the COVID-19 declaration under Section 56 4(b)(1) of the Act, 21 U.S.C. section 360bbb-3(b)(1), unless the authorization is terminated or revoked sooner. Performed at [facility], [place] [address] [place], [place] [address]          Radiology Studies: No results found.      Scheduled Meds: . sodium chloride   Intravenous Once  . amiodarone  200 mg Oral Daily  . furosemide  40 mg Oral Daily  . levothyroxine  112 mcg Oral QAC breakfast  . lubiprostone  24 mcg Oral BID  . metoprolol succinate  25 mg Oral Daily  . pantoprazole  40 mg Oral BID  . rosuvastatin  10 mg Oral q1800  . sodium chloride flush  3 mL Intravenous Q12H  . tamsulosin  0.4 mg Oral QPC supper   Continuous Infusions: . sodium chloride 100 mL/hr at 05/23/19 0815  . sodium chloride       LOS: 5 days    Time spent: 25 minutes    Barb Merino, MD [facility] Pager [phone]

## 2019-05-23 NOTE — Anesthesia Preprocedure Evaluation (Signed)
Anesthesia Evaluation  Patient identified by MRN, date of birth, ID band Patient awake    Reviewed: Allergy & Precautions, NPO status , Patient's Chart, lab work & pertinent test results  Airway Mallampati: I  TM Distance: >3 FB Neck ROM: Full    Dental   Pulmonary former smoker,    Pulmonary exam normal        Cardiovascular hypertension, + CAD, + Past MI and + CABG  Normal cardiovascular exam+ dysrhythmias Atrial Fibrillation      Neuro/Psych Anxiety    GI/Hepatic   Endo/Other    Renal/GU      Musculoskeletal   Abdominal   Peds  Hematology   Anesthesia Other Findings   Reproductive/Obstetrics                             Anesthesia Physical Anesthesia Plan  ASA: III  Anesthesia Plan: MAC   Post-op Pain Management:    Induction: Intravenous  PONV Risk Score and Plan: 1 and Ondansetron and Treatment may vary due to age or medical condition  Airway Management Planned: Nasal Cannula  Additional Equipment:   Intra-op Plan:   Post-operative Plan:   Informed Consent: I have reviewed the patients History and Physical, chart, labs and discussed the procedure including the risks, benefits and alternatives for the proposed anesthesia with the patient or authorized representative who has indicated his/her understanding and acceptance.       Plan Discussed with: CRNA and Surgeon  Anesthesia Plan Comments:         Anesthesia Quick Evaluation

## 2019-05-23 NOTE — Anesthesia Procedure Notes (Signed)
Procedure Name: MAC Date/Time: 05/23/2019 2:09 PM Performed by: Orlie Dakin, CRNA Pre-anesthesia Checklist: Patient identified, Emergency Drugs available and Patient being monitored Patient Re-evaluated:Patient Re-evaluated prior to induction Oxygen Delivery Method: Nasal cannula Preoxygenation: Pre-oxygenation with 100% oxygen Induction Type: IV induction Placement Confirmation: positive ETCO2

## 2019-05-23 NOTE — Anesthesia Postprocedure Evaluation (Signed)
Anesthesia Post Note  Patient: Leonard [name]  Procedure(s) Performed: ENTEROSCOPY (N/A ) HOT HEMOSTASIS (ARGON PLASMA COAGULATION/BICAP) (N/A )     Patient location during evaluation: PACU Anesthesia Type: MAC Level of consciousness: awake and alert Pain management: pain level controlled Vital Signs Assessment: post-procedure vital signs reviewed and stable Respiratory status: spontaneous breathing, nonlabored ventilation, respiratory function stable and patient connected to nasal cannula oxygen Cardiovascular status: stable and blood pressure returned to baseline Postop Assessment: no apparent nausea or vomiting Anesthetic complications: no    Last Vitals:  Vitals:   05/23/19 1444 05/23/19 1612  BP: (!) 126/56 (!) 107/49  Pulse: 66 68  Resp: 16 15  Temp:  (!) 36.4 C  SpO2: 99% 100%    Last Pain:  Vitals:   05/23/19 1612  TempSrc: Oral  PainSc:                  Ashtan Laton DAVID

## 2019-05-23 NOTE — Op Note (Signed)
[facility] Patient Name: Leonard Berg Procedure Date : 05/23/2019 MRN: [id number] Attending MD: [name] , MD Date of Birth: Jul 17, 1929 CSN: [id number] Age: 86 Admit Type: Inpatient Procedure:                Small bowel enteroscopy Indications:              Melena Providers:                [name], MD, Benay Pillow, RN, Janeece Agee,                            Technician, Otilio Saber, Technician, Vania Rea, CRNA Referring MD:              Medicines:                Propofol per Anesthesia Complications:            No immediate complications. Estimated Blood Loss:     Estimated blood loss: none. Procedure:                Pre-Anesthesia Assessment:                           - Prior to the procedure, a History and Physical                            was performed, and patient medications and                            allergies were reviewed. The patient's tolerance of                            previous anesthesia was also reviewed. The risks                            and benefits of the procedure and the sedation                            options and risks were discussed with the patient.                            All questions were answered, and informed consent                            was obtained. Prior Anticoagulants: The patient has                            taken no previous anticoagulant or antiplatelet                            agents. ASA Grade Assessment: III - A patient with                            severe  systemic disease. After reviewing the risks                            and benefits, the patient was deemed in                            satisfactory condition to undergo the procedure.                           - Sedation was administered by an anesthesia                            professional. Deep sedation was attained.                           After obtaining informed consent, the endoscope was                             passed under direct vision. Throughout the                            procedure, the patient's blood pressure, pulse, and                            oxygen saturations were monitored continuously. The                            [id number] Olympus pediatric colonscope                            was introduced through the mouth and advanced to                            the small bowel distal to the Ligament of Treitz.                            The small bowel enteroscopy was accomplished                            without difficulty. The patient tolerated the                            procedure well. Scope In: Scope Out: Findings:      The esophagus was normal.      The stomach was normal.      The examined duodenum was normal.      A single angiodysplastic lesion with no bleeding was found in the       proximal jejunum. Coagulation for tissue destruction using monopolar       probe was successful.      One small nonbleeding AVM was identified and ablated with APC. There was       no evidence of any bleeding. Impression:               - Normal esophagus.                           -  Normal stomach.                           - Normal examined duodenum.                           - A single non-bleeding angiodysplastic lesion in                            the jejunum. Treated with a monopolar probe.                           - No specimens collected. Recommendation:           - Return patient to hospital ward for ongoing care.                           - Resume regular diet.                           - A trial of being back on anticoagulation can be                            pursued. It is unlikely that the one AVM was the                            source of bleeding. Procedure Code(s):        --- Professional ---                           [phone], Small intestinal endoscopy, enteroscopy                            beyond second portion of duodenum,  not including                            ileum; with ablation of tumor(s), polyp(s), or                            other lesion(s) not amenable to removal by hot                            biopsy forceps, bipolar cautery or snare technique Diagnosis Code(s):        --- Professional ---                           K55.20, Angiodysplasia of colon without hemorrhage                           K92.1, Melena (includes Hematochezia) CPT copyright 2019 American Medical Association. All rights reserved. The codes documented in this report are preliminary and upon coder review may  be revised to meet current compliance requirements. [name], MD [name], MD 05/23/2019 2:26:35 PM This report has been signed electronically. Number of Addenda: 0

## 2019-05-23 NOTE — Interval H&P Note (Signed)
History and Physical Interval Note:  05/23/2019 2:22 PM  Leonard Back Morera Sr.  has presented today for surgery, with the diagnosis of ANEMIA, AVM's in the duodenum on capsule study.  The various methods of treatment have been discussed with the patient and family. After consideration of risks, benefits and other options for treatment, the patient has consented to  Procedure(s): ENTEROSCOPY (N/A) HOT HEMOSTASIS (ARGON PLASMA COAGULATION/BICAP) (N/A) as a surgical intervention.  The patient's history has been reviewed, patient examined, no change in status, stable for surgery.  I have reviewed the patient's chart and labs.  Questions were answered to the patient's satisfaction.     Leonard Berg

## 2019-05-24 ENCOUNTER — Telehealth: Payer: Self-pay | Admitting: Primary Care

## 2019-05-24 LAB — HEMOGLOBIN AND HEMATOCRIT, BLOOD
HCT: 25.3 % — ABNORMAL LOW (ref 39.0–52.0)
Hemoglobin: 8.1 g/dL — ABNORMAL LOW (ref 13.0–17.0)

## 2019-05-24 MED ORDER — RIVAROXABAN 15 MG PO TABS
15.0000 mg | ORAL_TABLET | Freq: Every day | ORAL | Status: DC
  Administered 2019-05-24: 15 mg via ORAL
  Filled 2019-05-24: qty 1

## 2019-05-24 MED ORDER — RIVAROXABAN 20 MG PO TABS: 20.0000 mg | ORAL_TABLET | Freq: Every day | ORAL | Status: DC

## 2019-05-24 NOTE — Discharge Instructions (Signed)

## 2019-05-24 NOTE — Telephone Encounter (Signed)
Please thank Robby for the update, yes I've reviewed his hospital notes.  Are they needing help with medical DPR form? Can you assist?

## 2019-05-24 NOTE — Telephone Encounter (Signed)
[name] Robby(Eri Brooke Bonito) called today He stated that the patient is in the hospital with a bleed Robby stated that his brother [name] and himself are taking care of everything for The patient and his mother, since mother has memory problems.  They wanted to make sure that you are aware of the patient being hospitalized and could watch the notes in his chart from the hospital .    Robby stated that he wanted to know what he needs to do to be able to discuss health concerns with you.  Right now they stated it is difficult to get the patient to sign a hippa form. And with his wife having memory problems they know she will not remember anything that is told to her to translate back to them   Please advise

## 2019-05-24 NOTE — Telephone Encounter (Signed)
I have spoken to Robby and inform that when patient is schedule for appointment again, he may come with patient and sign a DPR.  Robby stated that he is concern how low the patient's hemoglobin number is. He is concern that they cannot find where the bleeding is. He also so concern that patient is going back on Xaelto starting tomorrow .    He stated that is it okay to send message through Mychart and I have inform him that he must get the the okay with patient.

## 2019-05-24 NOTE — Progress Notes (Signed)
PROGRESS NOTE    Leonard SPANNAGEL Sr.  [id number] DOB: [birth year] DOA: 05/18/2019 PCP: Leonard Koch, NP    Brief Narrative:  Patient is an 86 year old male with history of paroxysmal A. fib status post cardioversion 12/20, on Xarelto, coronary artery disease status post CABG in [id number], chronic systolic heart failure with known ejection fraction 35%, peripheral artery disease with renal artery stent, coronary artery disease, recurrent pulmonary emboli last one submassive PE in 2016 on long-term anticoagulation with Xarelto and hyperlipidemia presented to the hospital with progressive weakness ongoing since November 2020, feeling more weaker and noticing dark blackish stools for about 2 weeks.  Patient had episode of near syncope at home and gradually has become weaker every day.  In the emergency room, hemodynamically stable.  Blood pressure stable.  Hemoglobin down from 14.6-7.2 in 2 months.  Patient was given 1 unit of PRBC and admitted to the hospital.   Assessment & Plan:   Active Problems:   GIB (gastrointestinal bleeding)  Acute blood loss anemia, suspected GI bleeding, source unclear:  PRBC, 05/18/2019 1 unit  PRBC, 05/19/2019 1 unit  Hemoglobin is 8.2-8.9-8. He has appropriately responded.  Patient had orthostatic hypotension and now on maintenance iv fluids. Continue monitor. Status post EGD, no bleeding lesions or source of bleeding. Status post colonoscopy, no bleeding lesions or source of bleeding. 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  Underwent repeat EGD, enteroscopy with cauterization of AVMs.  No active bleeding AVMs were found. Patient has more than 20 unit drop in systolic blood pressure today, less symptomatic. Continue IV fluids and frequent mobility.  Paroxysmal atrial fibrillation: Currently sinus rhythm on amiodarone.  Rate controlled.  Anticoagulation on hold.  Coronary artery disease: Currently without any chest pain.  Aspirin on  hold.    Chronic anticoagulation and recurrent pulmonary embolism:  Now with severe symptomatic anemia.  Discussed with patient and patient's son [name] on the phone about difficult decision of resuming anticoagulation. We discussed that patient has risk and benefit both ways, #1. we can challenge him with anticoagulation and monitor for any bleeding, if recurrent bleeding, transfuse him and treat him and stop anticoagulation, give him IVC filter. #2. we may stop anticoagulation, that may provoke pulmonary embolism which could be fatal. Given all the circumstances, difficult decisions, at this time patient and family prefer to challenge him with anticoagulation and monitor. He will be monitored in the hospital tonight, Xarelto will be started tonight and if uneventful, will send him home with frequent outpatient follow-up with hemoglobin.   DVT prophylaxis: SCDs , Xarelto Code Status: DNR Family Communication: Patient son [name] called and updated on the phone. Disposition Plan: patient is from home. Anticipated DC to home with wife, Barriers to discharge active treatment for GI bleeding.   Still symptomatic and orthostatic    Consultants:   Gastroenterology  Procedures:   EGD, 05/18/2019 normal findings  Colonoscopy, 05/20/2019 normal findings  Capsule endoscopy with upper GI 05/20/2019, multiple AVMs.  Antimicrobials:   None   Subjective: Patient seen and examined.  No overnight events.  Had one bowel movement last night that was "juicy" no blood noted. His hemoglobin has remained stable after total 2 units of transfusion at around 8. I discussed with him about challenges regarding anticoagulation, he showed me his DNR band and stated that we could suggest what is more beneficial to him.  Objective: Vitals:   05/23/19 2032 05/24/19 0500 05/24/19 0538 05/24/19 1100  BP: Marland Kitchen)  122/54  (!) 119/53 (!) 122/50  Pulse: 71  68 72  Resp: 20  15 18   Temp: 98.4 F (36.9 C)   98.4 F (36.9 C) 98.4 F (36.9 C)  TempSrc: Oral  Oral Oral  SpO2:   96% 95%  Weight:  104.1 kg    Height:        Intake/Output Summary (Last 24 hours) at 05/24/2019 1315 Last data filed at 05/24/2019 1127 Gross per 24 hour  Intake 350 ml  Output 1150 ml  Net -800 ml   Filed Weights   05/21/19 0605 05/22/19 0508 05/24/19 0500  Weight: 99.2 kg 98.2 kg 104.1 kg    Examination:  General exam: Appears calm and comfortable, on room air. Respiratory system: Clear to auscultation. Respiratory effort normal. Cardiovascular system: S1 & S2 heard, RRR.  Gastrointestinal system: Abdomen is nondistended, soft and nontender. Central nervous system: Alert and oriented. Pleasant and conversant. No focal neurological deficits. Extremities: Symmetric 5 x 5 power. Skin: No rashes, lesions or ulcers. Psychiatry: Judgement and insight appear normal. Mood & affect appropriate.     Data Reviewed: I have personally reviewed following labs and imaging studies  CBC: Recent Labs  Lab 05/18/19 1420 05/18/19 1420 05/19/19 0002 05/19/19 0002 05/19/19 1034 05/19/19 1034 05/19/19 2125 05/19/19 2125 05/20/19 1045 05/20/19 1819 05/22/19 0500 05/22/19 1633 05/23/19 0505 05/23/19 1651 05/24/19 0542  WBC 9.8  --  8.8  --  6.6  --  8.0  --  6.5  --   --   --   --   --   --   HGB 7.2*   < > 7.6*   < > 7.2*   < > 8.9*   < > 8.1*   < > 8.2* 8.9* 8.0* 9.2* 8.1*  HCT 22.8*   < > 22.9*   < > 22.1*   < > 26.9*   < > 25.4*   < > 25.9* 28.2* 24.9* 28.7* 25.3*  MCV 104.6*  --  98.3  --  100.9*  --  97.8  --  100.0  --   --   --   --   --   --   PLT 198  --  191  --  204  --  198  --  196  --   --   --   --   --   --    < > = values in this interval not displayed.   Basic Metabolic Panel: Recent Labs  Lab 05/18/19 1420 05/19/19 0002 05/20/19 0450 05/21/19 0556  NA 136 138 141 139  K 3.8 3.7 4.0 4.1  CL 102 104 108 105  CO2 23 25 25 25   GLUCOSE 156* 98 104* 108*  BUN 37* 35* 24* 18  CREATININE  1.80* 1.58* 1.60* 1.63*  CALCIUM 8.7* 8.6* 8.4* 8.5*  MG  --  2.8*  --   --    GFR: Estimated Creatinine Clearance: 39.8 mL/min (A) (by C-G formula based on SCr of 1.63 mg/dL (H)). Liver Function Tests: Recent Labs  Lab 05/19/19 0002 05/20/19 0450 05/21/19 0556  AST 20 22 27   ALT 16 15 17   ALKPHOS 45 42 52  BILITOT 1.2 1.0 0.7  PROT 5.1* 4.9* 5.0*  ALBUMIN 2.9* 2.7* 2.8*   No results for input(s): LIPASE, AMYLASE in the last 168 hours. No results for input(s): AMMONIA in the last 168 hours. Coagulation Profile: Recent Labs  Lab 05/18/19 1607  INR 1.6*   Cardiac Enzymes:  No results for input(s): CKTOTAL, CKMB, CKMBINDEX, TROPONINI in the last 168 hours. BNP (last 3 results) No results for input(s): PROBNP in the last 8760 hours. HbA1C: No results for input(s): HGBA1C in the last 72 hours. CBG: No results for input(s): GLUCAP in the last 168 hours. Lipid Profile: No results for input(s): CHOL, HDL, LDLCALC, TRIG, CHOLHDL, LDLDIRECT in the last 72 hours. Thyroid Function Tests: No results for input(s): TSH, T4TOTAL, FREET4, T3FREE, THYROIDAB in the last 72 hours. Anemia Panel: No results for input(s): VITAMINB12, FOLATE, FERRITIN, TIBC, IRON, RETICCTPCT in the last 72 hours. Sepsis Labs: No results for input(s): PROCALCITON, LATICACIDVEN in the last 168 hours.  Recent Results (from the past 240 hour(s))  SARS CORONAVIRUS 2 (TAT 6-24 HRS) Nasopharyngeal Nasopharyngeal Swab     Status: None   Collection Time: 05/18/19  4:56 PM   Specimen: Nasopharyngeal Swab  Result Value Ref Range Status   SARS Coronavirus 2 NEGATIVE NEGATIVE Final    Comment: (NOTE) SARS-CoV-2 target nucleic acids are NOT DETECTED. The SARS-CoV-2 RNA is generally detectable in upper and lower respiratory specimens during the acute phase of infection. Negative results do not preclude SARS-CoV-2 infection, do not rule out co-infections with other pathogens, and should not be used as the sole basis  for treatment or other patient management decisions. Negative results must be combined with clinical observations, patient history, and epidemiological information. The expected result is Negative. Fact Sheet for Patients: SugarRoll.be Fact Sheet for Healthcare Providers: https://www.woods-mathews.com/ This test is not yet approved or cleared by the [place] FDA and  has been authorized for detection and/or diagnosis of SARS-CoV-2 by FDA under an Emergency Use Authorization (EUA). This EUA will remain  in effect (meaning this test can be used) for the duration of the COVID-19 declaration under Section 56 4(b)(1) of the Act, 21 U.S.C. section 360bbb-3(b)(1), unless the authorization is terminated or revoked sooner. Performed at [facility], [place] [address] [place], [place] [address]          Radiology Studies: No results found.      Scheduled Meds: . sodium chloride   Intravenous Once  . amiodarone  200 mg Oral Daily  . furosemide  40 mg Oral Daily  . levothyroxine  112 mcg Oral QAC breakfast  . lubiprostone  24 mcg Oral BID  . metoprolol succinate  25 mg Oral Daily  . pantoprazole  40 mg Oral BID  . rivaroxaban  20 mg Oral Q supper  . rosuvastatin  10 mg Oral q1800  . sodium chloride flush  3 mL Intravenous Q12H  . tamsulosin  0.4 mg Oral QPC supper   Continuous Infusions: . sodium chloride 100 mL/hr at 05/24/19 1258     LOS: 6 days    Time spent: 25 minutes    Barb Merino, MD [facility] Pager [phone]

## 2019-05-24 NOTE — Telephone Encounter (Signed)
Is he wanting me to communicate with him via My Chart regarding his concerns? Just clarifying.

## 2019-05-24 NOTE — Progress Notes (Signed)
Occupational Therapy Treatment Patient Details Name: Leonard WENBERG Sr. MRN: [id number] DOB: 1929/08/14 Today's Date: 05/24/2019    History of present illness Pt is an 86 y/o male admitted secondary to increased weakness and SOB. Found to have symptomatic anemia, likely secondary to GI bleed. Pt is s/p colonoscopy and EGD with placement of VCE. PMH includes a fib, CAD s/p CABG, CHF, PAD, PE, R THA, and R TKA.    OT comments  Pt seen for OT f/u session, very internally distracted by end of life conversation he had previously had with MD. Pt very tearful during session. Offered emotional support and coping skills while in hospital. Pt agreeable to sit EOB for meal despite education for OOB activity to chair. Pt completes bed mobility at supervision level. Spoke with pt about therapy goals and keeping independence near end of life situations. Continue to recommend [place]. Will continue to follow   Follow Up Recommendations  Home health OT;Supervision/Assistance - 24 hour    Equipment Recommendations  3 in 1 bedside commode    Recommendations for Other Services      Precautions / Restrictions Precautions Precautions: Fall;Other (comment) Precaution Comments: Watch BP  Restrictions Weight Bearing Restrictions: No       Mobility Bed Mobility Overal bed mobility: Needs Assistance Bed Mobility: Supine to Sit;Sit to Supine     Supine to sit: Supervision Sit to supine: Supervision   General bed mobility comments: supervision for safety   Transfers Overall transfer level: Needs assistance Equipment used: Rolling walker (2 wheeled) Transfers: Sit to/from Stand Sit to Stand: Min guard         General transfer comment: min guard with extra time for power up, supervision for stand-sit    Balance Overall balance assessment: Needs assistance Sitting-balance support: No upper extremity supported;Feet supported Sitting balance-Leahy Scale: Good Sitting balance - Comments: mod I  EOB   Standing balance support: Bilateral upper extremity supported;During functional activity Standing balance-Leahy Scale: Poor Standing balance comment: relaint on BUE and external support                           ADL either performed or assessed with clinical judgement   ADL Overall ADL's : Needs assistance/impaired Eating/Feeding: Set up;Sitting                                   Functional mobility during ADLs: Minimal assistance;Rolling walker General ADL Comments: patient limited by weakness and orthostatic BP; limited in OOB BADL this date 2/2 emotional distress over conversation with doctor on outcomes     Vision Baseline Vision/History: Wears glasses Wears Glasses: At all times Patient Visual Report: No change from baseline     Perception     Praxis      Cognition Arousal/Alertness: Awake/alert Behavior During Therapy: WFL for tasks assessed/performed(tearful) Overall Cognitive Status: Within Functional Limits for tasks assessed                                 General Comments: pt tearful about conversation regarding outcome with dr and very internally distracted by this        Exercises     Shoulder Instructions       General Comments BP stable through session    Pertinent Vitals/ Pain       Pain Assessment:  No/denies pain  Home Living                                          Prior Functioning/Environment              Frequency  Min 2X/week        Progress Toward Goals  OT Goals(current goals can now be found in the care plan section)  Progress towards OT goals: Progressing toward goals  Acute Rehab OT Goals Patient Stated Goal: to feel better OT Goal Formulation: With patient Time For Goal Achievement: 06/05/19 Potential to Achieve Goals: Good  Plan Discharge plan remains appropriate    Co-evaluation                 AM-PAC OT "6 Clicks" Daily Activity      Outcome Measure   Help from another person eating meals?: None Help from another person taking care of personal grooming?: A Little Help from another person toileting, which includes using toliet, bedpan, or urinal?: A Little Help from another person bathing (including washing, rinsing, drying)?: A Little Help from another person to put on and taking off regular upper body clothing?: A Little Help from another person to put on and taking off regular lower body clothing?: A Little 6 Click Score: 19    End of Session    OT Visit Diagnosis: Other abnormalities of gait and mobility (R26.89);Muscle weakness (generalized) (M62.81);Dizziness and giddiness (R42)   Activity Tolerance Other (comment)(limited by emotional distress of end of life conversation)   Patient Left in bed;with call bell/phone within reach;with bed alarm set   Nurse Communication Mobility status        Time: 1439-1450 OT Time Calculation (min): 11 min  Charges: OT General Charges $OT Visit: 1 Visit OT Treatments $Self Care/Home Management : 8-22 mins  Zenovia Jarred, MSOT, OTR/L Acute Rehabilitation Services [facility] Office Number: [phone]  Zenovia Jarred 05/24/2019, 6:03 PM

## 2019-05-24 NOTE — Telephone Encounter (Signed)
Noted. Leonard Berg is aware both parent's son have access to patient's MyChart. They may contact and update through there

## 2019-05-24 NOTE — Progress Notes (Signed)
Physical Therapy Treatment Patient Details Name: Leonard ZARA Sr. MRN: [id number] DOB: 1929-09-16 Today's Date: 05/24/2019    History of Present Illness Pt is an 86 y/o male admitted secondary to increased weakness and SOB. Found to have symptomatic anemia, likely secondary to GI bleed. Pt is s/p colonoscopy and EGD with placement of VCE. PMH includes a fib, CAD s/p CABG, CHF, PAD, PE, R THA, and R TKA.     PT Comments    Pt in bed upon arrival of PT, agreeable to PT session with focus on mobility progression and independence. The pt continues to present with limitations in functional mobility, endurance, and dynamic stability compared to their prior level of function and independence due to above dx. However, the pt was able to demo improvement in bed mobility and transfer today with good independence during short ambulation. The pt will continue to benefit from skilled PT to progress exercises for balance and standing stability as well as mobility/endurance.     Follow Up Recommendations  Home health PT;Supervision for mobility/OOB     Equipment Recommendations  Other (comment)(TBD)    Recommendations for Other Services       Precautions / Restrictions Precautions Precautions: Fall;Other (comment) Precaution Comments: Watch BP  Restrictions Weight Bearing Restrictions: No    Mobility  Bed Mobility Overal bed mobility: Needs Assistance Bed Mobility: Supine to Sit;Sit to Supine     Supine to sit: Supervision Sit to supine: Supervision   General bed mobility comments: supervision for safety   Transfers Overall transfer level: Needs assistance Equipment used: Rolling walker (2 wheeled) Transfers: Sit to/from Stand Sit to Stand: Min guard         General transfer comment: min guard with extra time for power up, supervision for stand-sit  Ambulation/Gait Ambulation/Gait assistance: Min guard Gait Distance (Feet): 30 Feet Assistive device: Rolling walker (2  wheeled) Gait Pattern/deviations: Step-through pattern;Decreased stance time - left;Shuffle   Gait velocity interpretation: <1.8 ft/sec, indicate of risk for recurrent falls General Gait Details: minimal clearance bilaterally, decreased speed, no LOB   Stairs             Wheelchair Mobility    Modified Rankin (Stroke Patients Only)       Balance Overall balance assessment: Needs assistance Sitting-balance support: No upper extremity supported;Feet supported Sitting balance-Leahy Scale: Good Sitting balance - Comments: mod I EOB   Standing balance support: Bilateral upper extremity supported;During functional activity Standing balance-Leahy Scale: Poor Standing balance comment: relaint on BUE and external support                            Cognition Arousal/Alertness: Awake/alert Behavior During Therapy: WFL for tasks assessed/performed Overall Cognitive Status: Within Functional Limits for tasks assessed                                        Exercises      General Comments General comments (skin integrity, edema, etc.): BP stable through session      Pertinent Vitals/Pain Pain Assessment: No/denies pain    Home Living                      Prior Function            PT Goals (current goals can now be found in the care plan section) Acute Rehab  PT Goals Patient Stated Goal: to feel better PT Goal Formulation: With patient Time For Goal Achievement: 06/05/19 Potential to Achieve Goals: Good Progress towards PT goals: Progressing toward goals    Frequency    Min 3X/week      PT Plan Current plan remains appropriate    Co-evaluation              AM-PAC PT "6 Clicks" Mobility   Outcome Measure  Help needed turning from your back to your side while in a flat bed without using bedrails?: None Help needed moving from lying on your back to sitting on the side of a flat bed without using bedrails?:  None Help needed moving to and from a bed to a chair (including a wheelchair)?: A Little Help needed standing up from a chair using your arms (e.g., wheelchair or bedside chair)?: A Little Help needed to walk in hospital room?: A Little Help needed climbing 3-5 steps with a railing? : A Little 6 Click Score: 20    End of Session Equipment Utilized During Treatment: Gait belt Activity Tolerance: Patient tolerated treatment well;No increased pain Patient left: in bed;with call bell/phone within reach Nurse Communication: Mobility status PT Visit Diagnosis: Muscle weakness (generalized) (M62.81);Other abnormalities of gait and mobility (R26.89);Difficulty in walking, not elsewhere classified (R26.2)     Time: [id number] PT Time Calculation (min) (ACUTE ONLY): 15 min  Charges:  $Gait Training: 8-22 mins                     Karma Ganja, PT, DPT   Acute Rehabilitation Department Pager #: [phone]   Otho Bellows 05/24/2019, 4:49 PM

## 2019-05-24 NOTE — Plan of Care (Signed)

## 2019-05-25 LAB — HEMOGLOBIN AND HEMATOCRIT, BLOOD
HCT: 24.1 % — ABNORMAL LOW (ref 39.0–52.0)
Hemoglobin: 7.9 g/dL — ABNORMAL LOW (ref 13.0–17.0)

## 2019-05-25 LAB — BASIC METABOLIC PANEL
Anion gap: 10 (ref 5–15)
BUN: 12 mg/dL (ref 8–23)
CO2: 24 mmol/L (ref 22–32)
Calcium: 8.1 mg/dL — ABNORMAL LOW (ref 8.9–10.3)
Chloride: 107 mmol/L (ref 98–111)
Creatinine, Ser: 1.51 mg/dL — ABNORMAL HIGH (ref 0.61–1.24)
GFR calc Af Amer: 47 mL/min — ABNORMAL LOW (ref >60)
GFR calc non Af Amer: 41 mL/min — ABNORMAL LOW (ref >60)
Glucose, Bld: 104 mg/dL — ABNORMAL HIGH (ref 70–99)
Potassium: 3.6 mmol/L (ref 3.5–5.1)
Sodium: 141 mmol/L (ref 135–145)

## 2019-05-25 MED ORDER — DOCUSATE SODIUM 100 MG PO CAPS: 200.0000 mg | ORAL_CAPSULE | Freq: Every day | ORAL | 0 refills | Status: DC

## 2019-05-25 MED ORDER — RIVAROXABAN 15 MG PO TABS: 15.0000 mg | ORAL_TABLET | Freq: Every day | ORAL | 2 refills | Status: DC

## 2019-05-25 NOTE — Progress Notes (Signed)
Pt is discharged to go home with home health assistance.  Discharge instructions given to pt in presence of his wife. Pt was advised to discard old xarelto pills, as the new dose has changed.  All question were answered.

## 2019-05-25 NOTE — TOC Progression Note (Addendum)
Transition of Care (TOC) - Progression Note    Patient Details  Name: ROMELL SANTOLI Sr. MRN: [id number] Date of Birth: 01-12-1930  Transition of Care [facility]) CM/SW Contact  Jacalyn Lefevre Edson Snowball, RN Phone Number: 05/25/2019, 1:13 PM  Clinical Narrative:     Patient was arranged with [facility] , now requesting [place]. Mateo Flow with [facility] checking to see if they can accept referral.   Tiffany with [place] aware and waiting on [place] decision.   [facility] can accept patient however start of care would be 05/29/19  [facility] start of care is 05/27/19 or 2/14 at very latest . Son Heath Lark aware and wants [facility] [place]. Tiffany and Mateo Flow aware.  Expected Discharge Plan: [place] Barriers to Discharge: Continued Medical Work up  Expected Discharge Plan and Services Expected Discharge Plan: [place]   Discharge Planning Services: CM Consult Post Acute Care Choice: [place] arrangements for the past 2 months: Single Family Home Expected Discharge Date: 05/25/19               DME Arranged: N/A         HH Arranged: NA           Social Determinants of Health (SDOH) Interventions    Readmission Risk Interventions No flowsheet data found.

## 2019-05-25 NOTE — Progress Notes (Signed)
Physical Therapy Treatment Patient Details Name: Leonard BRUSSO Sr. MRN: [id number] DOB: Nov 09, 1929 Today's Date: 05/25/2019    History of Present Illness Pt is an 86 y/o male admitted secondary to increased weakness and SOB. Found to have symptomatic anemia, likely secondary to GI bleed. Pt is s/p colonoscopy and EGD with placement of VCE. PMH includes a fib, CAD s/p CABG, CHF, PAD, PE, R THA, and R TKA.     PT Comments    Pt is showing progress towards goals. Today he increased hallway ambulation distance and negotiated stairs. Pt reports increased back pain with ambulation leading to trunk flexion. Wife reports pt is very inactive and spends most of his time in a recliner chair, only mobilizing to eat and use the restroom. Feel he would benefit from HHPT to increase activity tolerance and functional independence.   Follow Up Recommendations  Home health PT;Supervision for mobility/OOB     Equipment Recommendations  Other (comment)(TBD)    Recommendations for Other Services       Precautions / Restrictions Precautions Precautions: Fall;Other (comment) Precaution Comments: Watch BP  Restrictions Weight Bearing Restrictions: No    Mobility  Bed Mobility Overal bed mobility: Needs Assistance Bed Mobility: Supine to Sit     Supine to sit: Supervision Sit to supine: Supervision   General bed mobility comments: supervision for safety   Transfers Overall transfer level: Needs assistance Equipment used: Rolling walker (2 wheeled) Transfers: Sit to/from Stand Sit to Stand: Min guard         General transfer comment: min guard with extra time for power up, supervision for stand-sit  Ambulation/Gait Ambulation/Gait assistance: Min guard Gait Distance (Feet): 75 Feet Assistive device: Rolling walker (2 wheeled) Gait Pattern/deviations: Step-through pattern;Decreased stance time - left;Shuffle;Trunk flexed     General Gait Details: minimal clearance bilaterally,  decreased speed, no LOB. Cues for postural control, pt reports back pain and back flexion at baseline for comfort.   Stairs Stairs: Yes Stairs assistance: Min assist Stair Management: One rail Right;Step to pattern;Forwards Number of Stairs: 3 General stair comments: Pt negotiated steps with rail on R and HHA on L for stability. No LOB noted.   Wheelchair Mobility    Modified Rankin (Stroke Patients Only)       Balance Overall balance assessment: Needs assistance Sitting-balance support: No upper extremity supported;Feet supported Sitting balance-Leahy Scale: Good Sitting balance - Comments: mod I EOB   Standing balance support: Bilateral upper extremity supported;During functional activity Standing balance-Leahy Scale: Poor Standing balance comment: relaint on BUE and external support                            Cognition Arousal/Alertness: Awake/alert Behavior During Therapy: WFL for tasks assessed/performed Overall Cognitive Status: Within Functional Limits for tasks assessed                                 General Comments: pt HOH, gave short simple answers to questions, but became more talktive at end of session.      Exercises      General Comments General comments (skin integrity, edema, etc.): Wife present for session and reports husband is fairly inactive. He sits and sleeps in recliner chair, only mobilizing to use restroom or eat.      Pertinent Vitals/Pain Pain Assessment: Faces Faces Pain Scale: Hurts little more Pain Location: back Pain Descriptors / Indicators:  Constant Pain Intervention(s): Monitored during session;Limited activity within patient's tolerance;Repositioned    Home Living                      Prior Function            PT Goals (current goals can now be found in the care plan section) Acute Rehab PT Goals Patient Stated Goal: to feel better PT Goal Formulation: With patient Time For Goal  Achievement: 06/05/19 Potential to Achieve Goals: Good Progress towards PT goals: Progressing toward goals    Frequency    Min 3X/week      PT Plan Current plan remains appropriate    Co-evaluation              AM-PAC PT "6 Clicks" Mobility   Outcome Measure  Help needed turning from your back to your side while in a flat bed without using bedrails?: None Help needed moving from lying on your back to sitting on the side of a flat bed without using bedrails?: None Help needed moving to and from a bed to a chair (including a wheelchair)?: A Little Help needed standing up from a chair using your arms (e.g., wheelchair or bedside chair)?: A Little Help needed to walk in hospital room?: A Little Help needed climbing 3-5 steps with a railing? : A Little 6 Click Score: 20    End of Session Equipment Utilized During Treatment: Gait belt Activity Tolerance: Patient tolerated treatment well Patient left: with call bell/phone within reach;in chair;with family/visitor present Nurse Communication: Mobility status PT Visit Diagnosis: Muscle weakness (generalized) (M62.81);Other abnormalities of gait and mobility (R26.89);Difficulty in walking, not elsewhere classified (R26.2)     Time: [id number] PT Time Calculation (min) (ACUTE ONLY): 29 min  Charges:  $Gait Training: 23-37 mins                    Benjiman Core, [place] Pager [phone] Acute Rehab   Allena Katz 05/25/2019, 11:25 AM

## 2019-05-25 NOTE — Discharge Summary (Signed)
Physician Discharge Summary  Leonard CHARNLEY Sr. [id number] DOB: 1930/03/23 DOA: 05/18/2019  PCP: [name], NP  Admit date: 05/18/2019 Discharge date: 05/25/2019  Admitted From: Home Disposition: Home  Recommendations for Outpatient Follow-up:  1. Follow up with PCP within 1 week, repeat blood work.  Discharge Condition: Stable CODE STATUS: Full code Diet recommendation: Regular diet, more fluids, avoid constipation  Discharge summary: Patient is an 86 year old male with history of paroxysmal A. fib status post cardioversion 12/20, on Xarelto, coronary artery disease status post CABG in [id number], chronic systolic heart failure with known ejection fraction 35%, peripheral artery disease with renal artery stent, coronary artery disease, recurrent pulmonary emboli last one submassive PE in 2016 on long-term anticoagulation with Xarelto and hyperlipidemia presented to the hospital with progressive weakness ongoing since November 2020, feeling more weaker and noticing dark blackish stools for about 2 weeks.  Patient had episode of near syncope at home and gradually has become weaker every day.  In the emergency room, hemodynamically stable.  Blood pressures were  stable.  Hemoglobin down from 14.6-7.2 in 2 months. Patient was given 1 unit of PRBC and admitted to the hospital.   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 PRBC, 05/18/2019 1 unit  PRBC, 05/19/2019 1 unit  Hemoglobin is 8.2-8.9-8-7.9 today.  He has appropriately responded to transfusions and no evidence of active GI bleeding while in the hospital. Status post EGD, no bleeding lesions or source of bleeding. Status post colonoscopy, no bleeding lesions or source of bleeding. 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  Underwent repeat EGD, enteroscopy with cauterization of AVMs.  No active bleeding AVMs were  found.  Chronic anticoagulation and recurrent pulmonary embolism:  Patient has paroxysmal A. fib, EF of 35%, currently stable in sinus rhythm.  He is on metoprolol and amiodarone. Patient has history of recurrent pulmonary embolism, last one was in 2016 and was submassive. It is going to be very challenging to manage anticoagulation on him. Given uncertain nature of AVM bleeding, after careful discussion with patient and family, we decided to challenge him with resuming Xarelto, dose changed to 15 mg as per his creatinine clearance and monitor. He will resume Xarelto.  He will go home today. Family will monitor closely for any recurrence of symptoms, evidence of GI bleed orthostatic drop in blood pressure. Repeat hemoglobin in 2/16 at primary care office, already has an appointment.  If patient has any evidence of more GI bleeding, drop in hemoglobin, he needs to stop Xarelto, stabilize and plan for IVC filter placement.  This was well communicated with patient and his son [name]. Advised plenty of water, hydration, nutritious food, avoid constipation and use laxatives as needed.    Discharge Diagnoses:  Active Problems:   GIB (gastrointestinal bleeding)    Discharge Instructions  Discharge Instructions    Call MD for:  difficulty breathing, headache or visual disturbances   Complete by: As directed    Call MD for:  extreme fatigue   Complete by: As directed    Call MD for:  persistant dizziness or light-headedness   Complete by: As directed    Diet - low sodium heart healthy   Complete by: As directed    Discharge instructions   Complete by: As directed    Drink plenty of fluids and avoid constipation, take laxatives as needed to have firm, smooth bowel movements.  Repeat CBC in 3-4 days   Increase activity slowly   Complete by: As directed      Allergies as of 05/25/2019   No Known Allergies     Medication List    STOP taking these medications   aspirin EC 81 MG  tablet   lisinopril 5 MG tablet Commonly known as: ZESTRIL     TAKE these medications   amiodarone 200 MG tablet Commonly known as: PACERONE Take 1 tablet by mouth twice daily for 7 days, then take 1 tablet by mouth daily.   Amitiza 24 MCG capsule Generic drug: lubiprostone Take 24 mcg by mouth 2 (two) times daily.   cetirizine 10 MG tablet Commonly known as: ZYRTEC Take 10 mg by mouth daily as needed for allergies.   docusate sodium 100 MG capsule Commonly known as: COLACE Take 2 capsules (200 mg total) by mouth daily. What changed:   when to take this  reasons to take this   furosemide 40 MG tablet Commonly known as: LASIX Take 1 tablet (40 mg total) by mouth daily.   guaiFENesin 600 MG 12 hr tablet Commonly known as: MUCINEX Take 600 mg by mouth 2 (two) times daily as needed for cough or to loosen phlegm.   levothyroxine 112 MCG tablet Commonly known as: SYNTHROID Take 112 mcg by mouth daily before breakfast.   metoprolol succinate 25 MG 24 hr tablet Commonly known as: Toprol XL Take 1 tablet (25 mg total) by mouth daily.   mometasone 50 MCG/ACT nasal spray Commonly known as: NASONEX Place 2 sprays into the nose daily as needed (for allergies).   multivitamin with minerals Tabs tablet Take 1 tablet by mouth daily.   Rivaroxaban 15 MG Tabs tablet Commonly known as: XARELTO Take 1 tablet (15 mg total) by mouth daily with supper. What changed:   medication strength  how much to take   rosuvastatin 10 MG tablet Commonly known as: CRESTOR TAKE ONE TABLET BY MOUTH IN THE EVENING What changed:   how much to take  how to take this  when to take this  additional instructions   tamsulosin 0.4 MG Caps capsule Commonly known as: FLOMAX TAKE 1 CAPSULE BY MOUTH ONCE DAILY   triamcinolone cream 0.1 % Commonly known as: KENALOG Apply 1 application topically 2 (two) times daily as needed (affected area(s) of ankle.).      Follow-up Information     Home, [facility] Follow up.   Specialty: [facility] Contact information: [place] [address] [phone]          No Known Allergies  Consultations:  Gastroenterology   Procedures/Studies: DG Chest 2 View  Result Date: 05/18/2019 CLINICAL DATA:  Chest pain EXAM: CHEST - 2 VIEW COMPARISON:  2016 FINDINGS: No new consolidation or edema. Mild scarring at the left lung base. No pleural effusion or pneumothorax. Stable cardiomediastinal contours. Interval placement of spinal stimulator. IMPRESSION: No acute process in the chest. Electronically Signed   By: Macy Mis M.D.   On: 05/18/2019 14:49     Subjective: Patient seen and examined.  Sitting in the bed and eating breakfast.  Blood pressures stable.  No bowel movements.  Yesterday he had bowel movement which she had no evidence of blood. Denies any abdominal pain. Discussed about going home and monitoring patient is agreeable.   Discharge Exam: Vitals:   05/25/19 0540 05/25/19 0957  BP: (!) 106/45 (!) 121/48  Pulse: 60 71  Resp: 20  Temp: 98.8 F (37.1 C)   SpO2: 96%    Vitals:   05/24/19 2113 05/25/19 0500 05/25/19 0540 05/25/19 0957  BP: (!) 133/57  (!) 106/45 (!) 121/48  Pulse: 65  60 71  Resp: 16  20   Temp: 98.3 F (36.8 C)  98.8 F (37.1 C)   TempSrc: Oral  Oral   SpO2: 96%  96%   Weight:  104.5 kg    Height:        General: Pt is alert, awake, not in acute distress, on room air.  Eating breakfast. Cardiovascular: RRR, S1/S2 +, no rubs, no gallops Respiratory: CTA bilaterally, no wheezing, no rhonchi Abdominal: Soft, NT, ND, bowel sounds + Extremities: no edema, no cyanosis    The results of significant diagnostics from this hospitalization (including imaging, microbiology, ancillary and laboratory) are listed below for reference.     Microbiology: Recent Results (from the past 240 hour(s))  SARS CORONAVIRUS 2 (TAT 6-24 HRS) Nasopharyngeal Nasopharyngeal  Swab     Status: None   Collection Time: 05/18/19  4:56 PM   Specimen: Nasopharyngeal Swab  Result Value Ref Range Status   SARS Coronavirus 2 NEGATIVE NEGATIVE Final    Comment: (NOTE) SARS-CoV-2 target nucleic acids are NOT DETECTED. The SARS-CoV-2 RNA is generally detectable in upper and lower respiratory specimens during the acute phase of infection. Negative results do not preclude SARS-CoV-2 infection, do not rule out co-infections with other pathogens, and should not be used as the sole basis for treatment or other patient management decisions. Negative results must be combined with clinical observations, patient history, and epidemiological information. The expected result is Negative. Fact Sheet for Patients: SugarRoll.be Fact Sheet for Healthcare Providers: https://www.woods-mathews.com/ This test is not yet approved or cleared by the [place] FDA and  has been authorized for detection and/or diagnosis of SARS-CoV-2 by FDA under an Emergency Use Authorization (EUA). This EUA will remain  in effect (meaning this test can be used) for the duration of the COVID-19 declaration under Section 56 4(b)(1) of the Act, 21 U.S.C. section 360bbb-3(b)(1), unless the authorization is terminated or revoked sooner. Performed at [facility], [place] [address] [place], [place] [address]      Labs: BNP (last 3 results) No results for input(s): BNP in the last 8760 hours. Basic Metabolic Panel: Recent Labs  Lab 05/18/19 1420 05/19/19 0002 05/20/19 0450 05/21/19 0556 05/25/19 0244  NA 136 138 141 139 141  K 3.8 3.7 4.0 4.1 3.6  CL 102 104 108 105 107  CO2 23 25 25 25 24   GLUCOSE 156* 98 104* 108* 104*  BUN 37* 35* 24* 18 12  CREATININE 1.80* 1.58* 1.60* 1.63* 1.51*  CALCIUM 8.7* 8.6* 8.4* 8.5* 8.1*  MG  --  2.8*  --   --   --    Liver Function Tests: Recent Labs  Lab 05/19/19 0002 05/20/19 0450 05/21/19 0556  AST 20 22  27   ALT 16 15 17   ALKPHOS 45 42 52  BILITOT 1.2 1.0 0.7  PROT 5.1* 4.9* 5.0*  ALBUMIN 2.9* 2.7* 2.8*   No results for input(s): LIPASE, AMYLASE in the last 168 hours. No results for input(s): AMMONIA in the last 168 hours. CBC: Recent Labs  Lab 05/18/19 1420 05/18/19 1420 05/19/19 0002 05/19/19 0002 05/19/19 1034 05/19/19 1034 05/19/19 2125 05/19/19 2125 05/20/19 1045 05/20/19 1819 05/22/19 1633 05/23/19 0505 05/23/19 1651 05/24/19 0542 05/25/19 0244  WBC 9.8  --  8.8  --  6.6  --  8.0  --  6.5  --   --   --   --   --   --   HGB 7.2*   < > 7.6*   < > 7.2*   < > 8.9*   < > 8.1*   < > 8.9* 8.0* 9.2* 8.1* 7.9*  HCT 22.8*   < > 22.9*   < > 22.1*   < > 26.9*   < > 25.4*   < > 28.2* 24.9* 28.7* 25.3* 24.1*  MCV 104.6*  --  98.3  --  100.9*  --  97.8  --  100.0  --   --   --   --   --   --   PLT 198  --  191  --  204  --  198  --  196  --   --   --   --   --   --    < > = values in this interval not displayed.   Cardiac Enzymes: No results for input(s): CKTOTAL, CKMB, CKMBINDEX, TROPONINI in the last 168 hours. BNP: Invalid input(s): POCBNP CBG: No results for input(s): GLUCAP in the last 168 hours. D-Dimer No results for input(s): DDIMER in the last 72 hours. Hgb A1c No results for input(s): HGBA1C in the last 72 hours. Lipid Profile No results for input(s): CHOL, HDL, LDLCALC, TRIG, CHOLHDL, LDLDIRECT in the last 72 hours. Thyroid function studies No results for input(s): TSH, T4TOTAL, T3FREE, THYROIDAB in the last 72 hours.  Invalid input(s): FREET3 Anemia work up No results for input(s): VITAMINB12, FOLATE, FERRITIN, TIBC, IRON, RETICCTPCT in the last 72 hours. Urinalysis    Component Value Date/Time   COLORURINE YELLOW 03/03/2012 1317   APPEARANCEUR CLEAR 03/03/2012 1317   LABSPEC 1.026 03/03/2012 1317   PHURINE 5.0 03/03/2012 1317   GLUCOSEU NEGATIVE 03/03/2012 1317   HGBUR NEGATIVE 03/03/2012 1317   BILIRUBINUR negative 11/19/2015 0839   KETONESUR 15 (A)  03/03/2012 1317   PROTEINUR 2+ 11/19/2015 0839   PROTEINUR NEGATIVE 03/03/2012 1317   UROBILINOGEN 0.2 11/19/2015 0839   UROBILINOGEN 0.2 03/03/2012 1317   NITRITE negative 11/19/2015 0839   NITRITE NEGATIVE 03/03/2012 1317   LEUKOCYTESUR small (1+) (A) 11/19/2015 0839   Sepsis Labs Invalid input(s): PROCALCITONIN,  WBC,  LACTICIDVEN Microbiology Recent Results (from the past 240 hour(s))  SARS CORONAVIRUS 2 (TAT 6-24 HRS) Nasopharyngeal Nasopharyngeal Swab     Status: None   Collection Time: 05/18/19  4:56 PM   Specimen: Nasopharyngeal Swab  Result Value Ref Range Status   SARS Coronavirus 2 NEGATIVE NEGATIVE Final    Comment: (NOTE) SARS-CoV-2 target nucleic acids are NOT DETECTED. The SARS-CoV-2 RNA is generally detectable in upper and lower respiratory specimens during the acute phase of infection. Negative results do not preclude SARS-CoV-2 infection, do not rule out co-infections with other pathogens, and should not be used as the sole basis for treatment or other patient management decisions. Negative results must be combined with clinical observations, patient history, and epidemiological information. The expected result is Negative. Fact Sheet for Patients: SugarRoll.be Fact Sheet for Healthcare Providers: https://www.woods-mathews.com/ This test is not yet approved or cleared by the [place] FDA and  has been authorized for detection and/or diagnosis of SARS-CoV-2 by FDA under an Emergency Use Authorization (EUA). This EUA will remain  in effect (meaning this test can be used) for the duration of the COVID-19 declaration under Section 56 4(b)(1) of the Act, 21 U.S.C.  section 360bbb-3(b)(1), unless the authorization is terminated or revoked sooner. Performed at [facility], [place] [address], [place], [place] [address]      Time coordinating discharge:  40 minutes  SIGNED:   Barb Merino, MD  [facility] 05/25/2019, 10:19 AM

## 2019-05-25 NOTE — Progress Notes (Signed)
Subjective: No complaints.  Objective: Vital signs in last 24 hours: Temp:  [98.3 F (36.8 C)-99 F (37.2 C)] 98.8 F (37.1 C) (02/11 0540) Pulse Rate:  [60-72] 60 (02/11 0540) Resp:  [16-20] 20 (02/11 0540) BP: (106-133)/(45-58) 106/45 (02/11 0540) SpO2:  [95 %-97 %] 96 % (02/11 0540) Weight:  [104.5 kg] 104.5 kg (02/11 0500) Last BM Date: 05/24/19  Intake/Output from previous day: 02/10 0701 - 02/11 0700 In: 150 [P.O.:150] Out: 975 [Urine:975] Intake/Output this shift: No intake/output data recorded.  General appearance: alert and no distress GI: soft, non-tender; bowel sounds normal; no masses,  no organomegaly  Lab Results: Recent Labs    05/23/19 1651 05/24/19 0542 05/25/19 0244  HGB 9.2* 8.1* 7.9*  HCT 28.7* 25.3* 24.1*   BMET Recent Labs    05/25/19 0244  NA 141  K 3.6  CL 107  CO2 24  GLUCOSE 104*  BUN 12  CREATININE 1.51*  CALCIUM 8.1*   LFT No results for input(s): PROT, ALBUMIN, AST, ALT, ALKPHOS, BILITOT, BILIDIR, IBILI in the last 72 hours. PT/INR No results for input(s): LABPROT, INR in the last 72 hours. Hepatitis Panel No results for input(s): HEPBSAG, HCVAB, HEPAIGM, HEPBIGM in the last 72 hours. C-Diff No results for input(s): CDIFFTOX in the last 72 hours. Fecal Lactopherrin No results for input(s): FECLLACTOFRN in the last 72 hours.  Studies/Results: No results found.  Medications:  Scheduled: . sodium chloride   Intravenous Once  . amiodarone  200 mg Oral Daily  . furosemide  40 mg Oral Daily  . levothyroxine  112 mcg Oral QAC breakfast  . lubiprostone  24 mcg Oral BID  . metoprolol succinate  25 mg Oral Daily  . pantoprazole  40 mg Oral BID  . rivaroxaban  15 mg Oral Q supper  . rosuvastatin  10 mg Oral q1800  . sodium chloride flush  3 mL Intravenous Q12H  . tamsulosin  0.4 mg Oral QPC supper   Continuous: . sodium chloride 100 mL/hr at 05/24/19 2340    Assessment/Plan: 1) GI bleed. 2) History of bilateral  pulmonary PE.   The patient requires some type of preventative treatment for any recurrent PEs.  He was restarted on Xarelto and there is currently no evidence of bleeding.  This may not appear for a few more days.  His HGB is stable.  If he rebleeds on Xarelto then an IVC filter needs to be placed.  Plan: 1) Upon discharge the patient can follow up in the office in 1-2 weeks.  LOS: 7 days   Hadasa Gasner D 05/25/2019, 7:08 AM

## 2019-05-26 ENCOUNTER — Ambulatory Visit: Payer: Medicare Other

## 2019-05-26 ENCOUNTER — Telehealth: Payer: Self-pay

## 2019-05-26 DIAGNOSIS — M961 Postlaminectomy syndrome, not elsewhere classified: Secondary | ICD-10-CM | POA: Diagnosis not present

## 2019-05-26 DIAGNOSIS — M1991 Primary osteoarthritis, unspecified site: Secondary | ICD-10-CM | POA: Diagnosis not present

## 2019-05-26 DIAGNOSIS — I2510 Atherosclerotic heart disease of native coronary artery without angina pectoris: Secondary | ICD-10-CM | POA: Diagnosis not present

## 2019-05-26 DIAGNOSIS — F419 Anxiety disorder, unspecified: Secondary | ICD-10-CM | POA: Diagnosis not present

## 2019-05-26 DIAGNOSIS — I4510 Unspecified right bundle-branch block: Secondary | ICD-10-CM | POA: Diagnosis not present

## 2019-05-26 DIAGNOSIS — G629 Polyneuropathy, unspecified: Secondary | ICD-10-CM | POA: Diagnosis not present

## 2019-05-26 DIAGNOSIS — I701 Atherosclerosis of renal artery: Secondary | ICD-10-CM | POA: Diagnosis not present

## 2019-05-26 DIAGNOSIS — I480 Paroxysmal atrial fibrillation: Secondary | ICD-10-CM | POA: Diagnosis not present

## 2019-05-26 DIAGNOSIS — K5790 Diverticulosis of intestine, part unspecified, without perforation or abscess without bleeding: Secondary | ICD-10-CM | POA: Diagnosis not present

## 2019-05-26 DIAGNOSIS — I739 Peripheral vascular disease, unspecified: Secondary | ICD-10-CM | POA: Diagnosis not present

## 2019-05-26 DIAGNOSIS — M4316 Spondylolisthesis, lumbar region: Secondary | ICD-10-CM | POA: Diagnosis not present

## 2019-05-26 DIAGNOSIS — N400 Enlarged prostate without lower urinary tract symptoms: Secondary | ICD-10-CM | POA: Diagnosis not present

## 2019-05-26 DIAGNOSIS — I110 Hypertensive heart disease with heart failure: Secondary | ICD-10-CM | POA: Diagnosis not present

## 2019-05-26 DIAGNOSIS — D696 Thrombocytopenia, unspecified: Secondary | ICD-10-CM | POA: Diagnosis not present

## 2019-05-26 DIAGNOSIS — I5020 Unspecified systolic (congestive) heart failure: Secondary | ICD-10-CM | POA: Diagnosis not present

## 2019-05-26 DIAGNOSIS — D62 Acute posthemorrhagic anemia: Secondary | ICD-10-CM | POA: Diagnosis not present

## 2019-05-26 NOTE — Telephone Encounter (Signed)
No TCM call completed, patient does not qualify for TCM services due to [facility] coverage. Please forward to appointment stay if hospital follow up visit is desired.

## 2019-05-26 NOTE — Telephone Encounter (Signed)
Noted  

## 2019-05-27 ENCOUNTER — Ambulatory Visit: Payer: Medicare Other | Attending: Internal Medicine

## 2019-05-27 DIAGNOSIS — Z23 Encounter for immunization: Secondary | ICD-10-CM | POA: Insufficient documentation

## 2019-05-27 NOTE — Progress Notes (Signed)
   Covid-19 Vaccination Clinic  Name:  Leonard NEIDHART Sr.    MRN: [id number] DOB: July 11, 1929  05/27/2019  Leonard Berg was observed post Covid-19 immunization for 15 minutes without incidence. He was provided with Vaccine Information Sheet and instruction to access the V-Safe system.   Leonard Berg was instructed to call 911 with any severe reactions post vaccine: Marland Kitchen Difficulty breathing  . Swelling of your face and throat  . A fast heartbeat  . A bad rash all over your body  . Dizziness and weakness    Immunizations Administered    Name Date Dose VIS Date Route   Pfizer COVID-19 Vaccine 05/27/2019  9:08 AM 0.3 mL 03/24/2019 Intramuscular   Manufacturer: [place]   Lot: [id number]   [place]: [id number]

## 2019-05-30 ENCOUNTER — Encounter: Payer: Medicare Other | Admitting: Primary Care

## 2019-05-31 ENCOUNTER — Other Ambulatory Visit: Payer: Self-pay

## 2019-05-31 ENCOUNTER — Ambulatory Visit (INDEPENDENT_AMBULATORY_CARE_PROVIDER_SITE_OTHER): Payer: Medicare Other | Admitting: Primary Care

## 2019-05-31 VITALS — BP 130/70 | HR 75 | Temp 96.6°F | Ht 72.0 in | Wt 225.0 lb

## 2019-05-31 DIAGNOSIS — K922 Gastrointestinal hemorrhage, unspecified: Secondary | ICD-10-CM

## 2019-05-31 DIAGNOSIS — I10 Essential (primary) hypertension: Secondary | ICD-10-CM

## 2019-05-31 DIAGNOSIS — K5900 Constipation, unspecified: Secondary | ICD-10-CM | POA: Diagnosis not present

## 2019-05-31 DIAGNOSIS — I2699 Other pulmonary embolism without acute cor pulmonale: Secondary | ICD-10-CM | POA: Diagnosis not present

## 2019-05-31 LAB — CBC
HCT: 27.3 % — ABNORMAL LOW (ref 39.0–52.0)
Hemoglobin: 8.9 g/dL — ABNORMAL LOW (ref 13.0–17.0)
MCHC: 32.7 g/dL (ref 30.0–36.0)
MCV: 94 fl (ref 78.0–100.0)
Platelets: 215 10*3/uL (ref 150.0–400.0)
RBC: 2.9 Mil/uL — ABNORMAL LOW (ref 4.22–5.81)
RDW: 15.8 % — ABNORMAL HIGH (ref 11.5–15.5)
WBC: 6.7 10*3/uL (ref 4.0–10.5)

## 2019-05-31 LAB — IBC + FERRITIN
Ferritin: 61.3 ng/mL (ref 22.0–322.0)
Iron: 16 ug/dL — ABNORMAL LOW (ref 42–165)
Saturation Ratios: 4.8 % — ABNORMAL LOW (ref 20.0–50.0)
Transferrin: 239 mg/dL (ref 212.0–360.0)

## 2019-05-31 MED ORDER — AMITIZA 24 MCG PO CAPS: 24.0000 ug | ORAL_CAPSULE | Freq: Two times a day (BID) | ORAL | 0 refills | Status: DC

## 2019-05-31 NOTE — Progress Notes (Signed)
Subjective:    Patient ID: Leonard Footman Berg., male    DOB: 01-23-1930, 86 y.o.   MRN: [id number]  HPI  This visit occurred during the SARS-CoV-2 public health emergency.  Safety protocols were in place, including screening questions prior to the visit, additional usage of staff PPE, and extensive cleaning of exam room while observing appropriate contact time as indicated for disinfecting solutions.   Leonard Berg is a 86 year old male with a history of hypertension, unstable angina, renal artery atherosclerosis, paroxysmal atrial fibrillation, PE, hypothyroidism, CAD, CHF, BPH who presents today for hospital follow up and several complaints.  He presented to [facility] ED on 05/18/19 with a chief complaint of chest pain, fatigue, shortness of breath, weakness. Symptoms began about one week prior, also noting some dark stools. During his stay in the ED labs showed anemia with hemoglobin of 7.2 which was down from 14.6 two months prior, negative troponin. No cardiopulmonary abnormalities. He did have melena on rectal exam so he was admitted for further evaluation.  During his hospital stay Xarelto and aspirin were held. He was transfused with 2 units of PRBC. Initiated on oral pantoprazole. Consulted by GI who  Recommended EGD and colonoscopy, both studies did not reveal a source of bleeding. He then underwent evaluation with oral VCE. Complications included orthostatic hypotension with symptomatic dizziness, treated with IV fluids. 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 He underwent repeat EGD/enteroscopy with cauterization of AVM's on 05/23/19.   Given his history of PE his family decided to resume his Xarelto and have him monitored for bleeding. There was discussion of potentially placing an IVC filter given his PE history coupled with GI bleed on Xarelto. Hemoglobin improved, responded to transfusions. Xarelto was decreased to 15 mg due to  creatinine clearance.   Since his discharge home he denies rectal bleeding. He's not had a bowel movement since 05/25/19. He's been taking Colace 200 mg daily. He does have Amitiza on his medication list, doesn't think he has these at home.  He is compliant his Xarelto 15 mg as prescribed.  He continues to feel "drunk", cannot walk well, feels dizzy and weak. this occurs with positional changes mostly, sitting to standing and with walking. Dizziness is improved with rest. He was set up for home health PT, had one visit last week. He is due for a visit today, unsure if this is the nurse or PT.   He had a Covid-19 vaccination four days ago. His son believes that he's more pale today. He is eating and drinking well, drinking only water. Denies nausea/vomiting.   BP Readings from Last 3 Encounters:  05/31/19 130/70  05/25/19 (!) 121/48  03/31/19 (!) 122/52     Review of Systems  Constitutional: Positive for fatigue.  Eyes: Negative for visual disturbance.  Respiratory: Negative for shortness of breath.   Cardiovascular: Negative for chest pain.  Gastrointestinal: Positive for constipation. Negative for abdominal pain, blood in stool, nausea and vomiting.  Neurological: Positive for dizziness and weakness.       Past Medical History:  Diagnosis Date  . Anemia   . Anxiety   . Arrhythmia    PAROXYSMAL ATRIAL FIBRILLATION  . Arthritis    OSTEOARTHRITIS  . Cataracts, bilateral   . Chronic back pain   . Coronary artery disease 8/11   CABG...LM EMERGENT WITH IABP sees Dr. Legrand Como cooper  . DJD (degenerative joint disease)   . DVT (  deep venous thrombosis) ([place])   . Dyspnea   . GERD (gastroesophageal reflux disease)   . History of BPH   . Hyperlipidemia   . Hypertension   . Hypothyroidism   . Lumbar post-laminectomy syndrome   . Myocardial infarction ([place])   . Pulmonary embolism ([place]) 2009   hx of  . RBBB (right bundle branch block)   . Renal artery stenosis ([place])   .  Thrombocytopenia ([place])   . Thyroid disease    HYPOTHYROIDISM  . Unstable angina ([facility])    NONE IN LONG TIME     Social History   Socioeconomic History  . Marital status: Married    Spouse name: Not on file  . Number of children: 2  . Years of education: Not on file  . Highest education level: Not on file  Occupational History  . 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 Smoking status: Former Smoker    Types: Cigarettes    Quit date: 04/13/1974    Years since quitting: 45.1  . Smokeless tobacco: Former Systems developer    Types: Chew    Quit date: 01/26/2002  . Tobacco comment: He has smoked about 27-pack-year hx   Substance and Sexual Activity  . Alcohol use: Yes    Alcohol/week: 2.0 standard drinks    Types: 2 Cans of beer per week    Comment: He used to drink more heavily , but rarely drinks at the current time  . Drug use: No  . Sexual activity: Not on file  Other Topics Concern  . Not on file  Social History Narrative   LIVES IN [place] WITH HIS WIFE.   HE HAS 2 GROWN CHILDREN   HE IS RETIRED PRISON FIRM SUPERINTENDENT.   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   HE GOES TO THE [employer] 3 X WEEKLY.   Social Determinants of Health   Financial Resource Strain:   . Difficulty of Paying Living Expenses: Not on file  Food Insecurity:   . Worried About Charity fundraiser in the Last Year: Not on file  . Ran Out of Food in the Last Year: Not on file  Transportation Needs:   . Lack of Transportation (Medical): Not on file  . Lack of Transportation (Non-Medical): Not on file  Physical Activity:   . Days of Exercise per Week: Not on file  . Minutes of Exercise per Session: Not on file  Stress:   . Feeling of Stress : Not on file  Social Connections:   . Frequency of Communication with Friends and Family: Not on file  . Frequency of Social Gatherings with Friends  and Family: Not on file  . Attends Religious Services: Not on file  . Active Member of Clubs or Organizations: Not on file  . Attends Archivist Meetings: Not on file  . Marital Status: Not on file  Intimate Partner Violence:   . Fear of Current or Ex-Partner: Not on file  . Emotionally Abused: Not on file  . Physically Abused: Not on file  . Sexually Abused: Not on file    Past Surgical History:  Procedure Laterality Date  . Blood clot  Feb.20,  2016   Pulmonary Embolism  . CARDIOVERSION N/A 03/15/2019   Procedure: CARDIOVERSION;  Surgeon: Pixie Casino, MD;  Location: [place];  Service: Cardiovascular;  Laterality: N/A;  . CAROTID ENDARTERECTOMY Right Oct. 17, 2013   CE  . CAROTID ENDARTERECTOMY Left Dec. 3, 2016   CE  . CATARACT EXTRACTION  05/2014,06/2014  . COLONOSCOPY WITH PROPOFOL N/A 05/20/2019   Procedure: COLONOSCOPY WITH PROPOFOL;  Surgeon: Rush Landmark Telford Nab., MD;  Location: [place];  Service: Gastroenterology;  Laterality: N/A;  . CORONARY ARTERY BYPASS GRAFT  11-21-09   EMERGENT X 3 GRAFTING. ..[name], MD, cc: [name]  . ENDARTERECTOMY  01/28/2012   Procedure: ENDARTERECTOMY CAROTID;  Surgeon: [name], MD;  Location: [place];  Service: Vascular;  Laterality: Right;  with Primary Closure of Artery  . ENDARTERECTOMY  03/15/2012   Procedure: ENDARTERECTOMY CAROTID;  Surgeon: [name], MD;  Location: [place];  Service: Vascular;  Laterality: Left;  . ENTEROSCOPY N/A 05/23/2019   Procedure: ENTEROSCOPY;  Surgeon: [name], MD;  Location: [place];  Service: Endoscopy;  Laterality: N/A;  . ESOPHAGOGASTRODUODENOSCOPY N/A 05/20/2019   Procedure: ESOPHAGOGASTRODUODENOSCOPY (EGD);  Surgeon: [name]., MD;  Location: [place];  Service: Gastroenterology;  Laterality: N/A;  . ESOPHAGOGASTRODUODENOSCOPY (EGD) WITH PROPOFOL N/A 05/19/2019   Procedure: ESOPHAGOGASTRODUODENOSCOPY (EGD) WITH PROPOFOL;   Surgeon: [name], MD;  Location: [place];  Service: Endoscopy;  Laterality: N/A;  . FOOT SURGERY    . GIVENS CAPSULE STUDY N/A 05/20/2019   Procedure: GIVENS CAPSULE STUDY;  Surgeon: [name]., MD;  Location: [facility];  Service: Gastroenterology;  Laterality: N/A;  . HOT HEMOSTASIS N/A 05/23/2019   Procedure: HOT HEMOSTASIS (ARGON PLASMA COAGULATION/BICAP);  Surgeon: [name], MD;  Location: [place];  Service: Endoscopy;  Laterality: N/A;  . MANDIBLE SURGERY    . MAXIMUM ACCESS (MAS)POSTERIOR LUMBAR INTERBODY FUSION (PLIF) 2 LEVEL N/A 08/08/2015   Procedure: Lumbar Four-Five, Lumbar Five-Sacral One Maximum access posterior lumbar interbody fusion;  Surgeon: [name], MD;  Location: [place] [facility];  Service: Neurosurgery;  Laterality: N/A;  LUMBAR FOUR-FIVE ,LUMBAR FIVE -SACRAL Maximum access posterior lumbar interbody fusion  . POLYPECTOMY  05/20/2019   Procedure: POLYPECTOMY;  Surgeon: [name], Telford Nab., MD;  Location: [facility] END[facility]COPY;  Service: Gastroenterology;;  . RENAL ARTERY STENT  2010  . REPLACEMENT TOTAL KNEE  2006   RIGHT  . SPINAL CORD STIMULATOR INSERTION N/A 06/18/2017   Procedure: LUMBAR SPINAL CORD STIMULATOR INSERTION;  Surgeon: [name], MD;  Location: [place];  Service: Neurosurgery;  Laterality: N/A;  LUMBAR SPINAL CORD STIMULATOR INSERTION  . TOTAL HIP ARTHROPLASTY     right    Family History  Problem Relation Age of Onset  . Lung cancer Father 80       deceased   . Cancer Father 6       LUNG  . Stroke Mother 46       DECEASED   . Hyperlipidemia Mother   . Heart attack Brother 25       deceased  . Diabetes Brother   . Liver cancer Sister 28       deceased   . Cancer Sister     No Known Allergies  Current Outpatient Medications on File Prior to Visit  Medication Sig Dispense Refill  . amiodarone (PACERONE) 200 MG tablet Take 1 tablet by mouth twice daily for 7 days, then take 1 tablet by mouth daily. 97 tablet 3  .  cetirizine (ZYRTEC) 10 MG tablet Take 10 mg by mouth daily as needed for allergies.     Marland Kitchen docusate sodium (COLACE) 100 MG capsule Take 2  capsules (200 mg total) by mouth daily. 10 capsule 0  . furosemide (LASIX) 40 MG tablet Take 1 tablet (40 mg total) by mouth daily. 90 tablet 1  . guaiFENesin (MUCINEX) 600 MG 12 hr tablet Take 600 mg by mouth 2 (two) times daily as needed for cough or to loosen phlegm.    Marland Kitchen levothyroxine (SYNTHROID) 112 MCG tablet Take 112 mcg by mouth daily before breakfast.    . metoprolol succinate (TOPROL XL) 25 MG 24 hr tablet Take 1 tablet (25 mg total) by mouth daily. 90 tablet 3  . mometasone (NASONEX) 50 MCG/ACT nasal spray Place 2 sprays into the nose daily as needed (for allergies).    . Multiple Vitamin (MULTIVITAMIN WITH MINERALS) TABS tablet Take 1 tablet by mouth daily.    . Rivaroxaban (XARELTO) 15 MG TABS tablet Take 1 tablet (15 mg total) by mouth daily with supper. 30 tablet 2  . rosuvastatin (CRESTOR) 10 MG tablet TAKE ONE TABLET BY MOUTH IN THE EVENING (Patient taking differently: Take 10 mg by mouth every evening. ) 30 tablet 0  . tamsulosin (FLOMAX) 0.4 MG CAPS capsule TAKE 1 CAPSULE BY MOUTH ONCE DAILY (Patient taking differently: Take 0.4 mg by mouth daily. ) 90 capsule 1  . triamcinolone cream (KENALOG) 0.1 % Apply 1 application topically 2 (two) times daily as needed (affected area(s) of ankle.).     No current facility-administered medications on file prior to visit.    BP 130/70   Pulse 75   Temp (!) 96.6 F (35.9 C) (Temporal)   Ht 6' (1.829 m)   Wt 225 lb (102.1 kg)   SpO2 98%   BMI 30.52 kg/m    Objective:   Physical Exam  Constitutional: He is oriented to person, place, and time. He appears well-nourished.  Cardiovascular: Normal rate and regular rhythm.  Respiratory: Effort normal and breath sounds normal.  GI: Soft. Bowel sounds are normal. He exhibits no distension. There is no abdominal tenderness.  Musculoskeletal:      Cervical back: Neck supple.  Neurological: He is alert and oriented to person, place, and time.  Skin: Skin is warm and dry.  Psychiatric: He has a normal mood and affect.           Assessment & Plan:

## 2019-05-31 NOTE — Patient Instructions (Addendum)
Stop by the lab prior to leaving today. I will notify you of your results once received.   You can take the Amitiza up to twice daily as needed for constipation.  Please notify me if the home health nurse or physical therapist doesn't stop by today.  It was a pleasure to see you today!

## 2019-05-31 NOTE — Assessment & Plan Note (Addendum)
Recent hospital admission for anemia that was discovered to be secondary to GI bleeding from AVMs.  Successful cauterization of AVMs thus far. Today he appears stable, perhaps slightly pale. Ambulatory in clinic with walker. Suspect fatigue/dizziness secondary to anemia, will need to closely monitor.  Check CBC and iron studies today. Continue with home health PT/nursing for strength and endurance.  Continue close monitoring for GI bleed with Xarelto.

## 2019-05-31 NOTE — Assessment & Plan Note (Signed)
On Xarelto at 15 mg which is a dose reduction due to creatinine clearance.  Continue to monitor.

## 2019-05-31 NOTE — Assessment & Plan Note (Signed)
Stable in the office today, continue current regimen. 

## 2019-05-31 NOTE — Assessment & Plan Note (Signed)
Acute since hospital stay. Refill provided for Amitiza.

## 2019-06-14 ENCOUNTER — Other Ambulatory Visit: Payer: Self-pay | Admitting: Primary Care

## 2019-06-14 DIAGNOSIS — D509 Iron deficiency anemia, unspecified: Secondary | ICD-10-CM

## 2019-06-15 ENCOUNTER — Other Ambulatory Visit: Payer: Self-pay | Admitting: Primary Care

## 2019-06-16 ENCOUNTER — Other Ambulatory Visit (INDEPENDENT_AMBULATORY_CARE_PROVIDER_SITE_OTHER): Payer: Medicare Other

## 2019-06-16 ENCOUNTER — Other Ambulatory Visit: Payer: Self-pay

## 2019-06-16 DIAGNOSIS — D509 Iron deficiency anemia, unspecified: Secondary | ICD-10-CM

## 2019-06-16 LAB — CBC
HCT: 29.9 % — ABNORMAL LOW (ref 39.0–52.0)
Hemoglobin: 9.7 g/dL — ABNORMAL LOW (ref 13.0–17.0)
MCHC: 32.5 g/dL (ref 30.0–36.0)
MCV: 90.9 fl (ref 78.0–100.0)
Platelets: 232 10*3/uL (ref 150.0–400.0)
RBC: 3.29 Mil/uL — ABNORMAL LOW (ref 4.22–5.81)
RDW: 17.6 % — ABNORMAL HIGH (ref 11.5–15.5)
WBC: 6.2 10*3/uL (ref 4.0–10.5)

## 2019-06-16 LAB — IBC + FERRITIN
Ferritin: 41.5 ng/mL (ref 22.0–322.0)
Iron: 4 ug/dL — ABNORMAL LOW (ref 42–165)
Saturation Ratios: 1.2 % — ABNORMAL LOW (ref 20.0–50.0)
Transferrin: 247 mg/dL (ref 212.0–360.0)

## 2019-06-18 ENCOUNTER — Ambulatory Visit: Payer: Medicare Other | Attending: Internal Medicine

## 2019-06-18 DIAGNOSIS — Z23 Encounter for immunization: Secondary | ICD-10-CM | POA: Insufficient documentation

## 2019-06-18 DIAGNOSIS — D509 Iron deficiency anemia, unspecified: Secondary | ICD-10-CM

## 2019-06-18 NOTE — Progress Notes (Signed)
   Covid-19 Vaccination Clinic  Name:  ABDALRAHMAN MANFORD Sr.    MRN: [id number] DOB: [birth year]  06/18/2019  Mr. Auvil was observed post Covid-19 immunization for 15 minutes without incident. He was provided with Vaccine Information Sheet and instruction to access the V-Safe system.   Mr. Basom was instructed to call 911 with any severe reactions post vaccine: Marland Kitchen Difficulty breathing  . Swelling of face and throat  . A fast heartbeat  . A bad rash all over body  . Dizziness and weakness   Immunizations Administered    Name Date Dose VIS Date Route   Pfizer COVID-19 Vaccine 06/18/2019  2:11 PM 0.3 mL 03/24/2019 Intramuscular   Manufacturer: [place]   Lot: [id number]   [place]: [id number]

## 2019-06-25 DIAGNOSIS — I739 Peripheral vascular disease, unspecified: Secondary | ICD-10-CM | POA: Diagnosis not present

## 2019-06-25 DIAGNOSIS — I110 Hypertensive heart disease with heart failure: Secondary | ICD-10-CM | POA: Diagnosis not present

## 2019-06-25 DIAGNOSIS — I4510 Unspecified right bundle-branch block: Secondary | ICD-10-CM | POA: Diagnosis not present

## 2019-06-25 DIAGNOSIS — M1991 Primary osteoarthritis, unspecified site: Secondary | ICD-10-CM | POA: Diagnosis not present

## 2019-06-25 DIAGNOSIS — M4316 Spondylolisthesis, lumbar region: Secondary | ICD-10-CM | POA: Diagnosis not present

## 2019-06-25 DIAGNOSIS — K5790 Diverticulosis of intestine, part unspecified, without perforation or abscess without bleeding: Secondary | ICD-10-CM | POA: Diagnosis not present

## 2019-06-25 DIAGNOSIS — I5020 Unspecified systolic (congestive) heart failure: Secondary | ICD-10-CM | POA: Diagnosis not present

## 2019-06-25 DIAGNOSIS — D62 Acute posthemorrhagic anemia: Secondary | ICD-10-CM | POA: Diagnosis not present

## 2019-06-25 DIAGNOSIS — G629 Polyneuropathy, unspecified: Secondary | ICD-10-CM | POA: Diagnosis not present

## 2019-06-25 DIAGNOSIS — N400 Enlarged prostate without lower urinary tract symptoms: Secondary | ICD-10-CM | POA: Diagnosis not present

## 2019-06-25 DIAGNOSIS — M961 Postlaminectomy syndrome, not elsewhere classified: Secondary | ICD-10-CM | POA: Diagnosis not present

## 2019-06-25 DIAGNOSIS — D696 Thrombocytopenia, unspecified: Secondary | ICD-10-CM | POA: Diagnosis not present

## 2019-06-25 DIAGNOSIS — I2510 Atherosclerotic heart disease of native coronary artery without angina pectoris: Secondary | ICD-10-CM | POA: Diagnosis not present

## 2019-06-25 DIAGNOSIS — F419 Anxiety disorder, unspecified: Secondary | ICD-10-CM | POA: Diagnosis not present

## 2019-06-25 DIAGNOSIS — I480 Paroxysmal atrial fibrillation: Secondary | ICD-10-CM | POA: Diagnosis not present

## 2019-06-25 DIAGNOSIS — I701 Atherosclerosis of renal artery: Secondary | ICD-10-CM | POA: Diagnosis not present

## 2019-06-28 ENCOUNTER — Inpatient Hospital Stay: Payer: Medicare Other | Attending: Oncology | Admitting: Oncology

## 2019-06-28 ENCOUNTER — Inpatient Hospital Stay: Payer: Medicare Other

## 2019-06-28 ENCOUNTER — Encounter: Payer: Self-pay | Admitting: Oncology

## 2019-06-28 VITALS — BP 121/65 | HR 76 | Temp 97.0°F | Resp 18 | Wt 227.6 lb

## 2019-06-28 DIAGNOSIS — I480 Paroxysmal atrial fibrillation: Secondary | ICD-10-CM | POA: Diagnosis not present

## 2019-06-28 DIAGNOSIS — D500 Iron deficiency anemia secondary to blood loss (chronic): Secondary | ICD-10-CM | POA: Diagnosis not present

## 2019-06-28 DIAGNOSIS — Z801 Family history of malignant neoplasm of trachea, bronchus and lung: Secondary | ICD-10-CM

## 2019-06-28 DIAGNOSIS — Z808 Family history of malignant neoplasm of other organs or systems: Secondary | ICD-10-CM | POA: Diagnosis not present

## 2019-06-28 DIAGNOSIS — I2699 Other pulmonary embolism without acute cor pulmonale: Secondary | ICD-10-CM | POA: Diagnosis not present

## 2019-06-28 DIAGNOSIS — Z87891 Personal history of nicotine dependence: Secondary | ICD-10-CM | POA: Insufficient documentation

## 2019-06-28 DIAGNOSIS — K922 Gastrointestinal hemorrhage, unspecified: Secondary | ICD-10-CM | POA: Diagnosis not present

## 2019-06-28 DIAGNOSIS — N1832 Chronic kidney disease, stage 3b: Secondary | ICD-10-CM | POA: Insufficient documentation

## 2019-06-28 DIAGNOSIS — E538 Deficiency of other specified B group vitamins: Secondary | ICD-10-CM | POA: Insufficient documentation

## 2019-06-28 DIAGNOSIS — D649 Anemia, unspecified: Secondary | ICD-10-CM

## 2019-06-28 DIAGNOSIS — D631 Anemia in chronic kidney disease: Secondary | ICD-10-CM | POA: Insufficient documentation

## 2019-06-28 DIAGNOSIS — Z809 Family history of malignant neoplasm, unspecified: Secondary | ICD-10-CM | POA: Diagnosis not present

## 2019-06-28 DIAGNOSIS — Z7901 Long term (current) use of anticoagulants: Secondary | ICD-10-CM | POA: Diagnosis not present

## 2019-06-28 LAB — FERRITIN: Ferritin: 31 ng/mL (ref 24–336)

## 2019-06-28 LAB — CBC WITH DIFFERENTIAL/PLATELET
Abs Immature Granulocytes: 0.02 10*3/uL (ref 0.00–0.07)
Basophils Absolute: 0.1 10*3/uL (ref 0.0–0.1)
Basophils Relative: 1 %
Eosinophils Absolute: 0.1 10*3/uL (ref 0.0–0.5)
Eosinophils Relative: 3 %
HCT: 35.8 % — ABNORMAL LOW (ref 39.0–52.0)
Hemoglobin: 11 g/dL — ABNORMAL LOW (ref 13.0–17.0)
Immature Granulocytes: 0 %
Lymphocytes Relative: 31 %
Lymphs Abs: 1.7 10*3/uL (ref 0.7–4.0)
MCH: 29.3 pg (ref 26.0–34.0)
MCHC: 30.7 g/dL (ref 30.0–36.0)
MCV: 95.5 fL (ref 80.0–100.0)
Monocytes Absolute: 0.7 10*3/uL (ref 0.1–1.0)
Monocytes Relative: 12 %
Neutro Abs: 3.1 10*3/uL (ref 1.7–7.7)
Neutrophils Relative %: 53 %
Platelets: 224 10*3/uL (ref 150–400)
RBC: 3.75 MIL/uL — ABNORMAL LOW (ref 4.22–5.81)
RDW: 16.9 % — ABNORMAL HIGH (ref 11.5–15.5)
WBC: 5.7 10*3/uL (ref 4.0–10.5)
nRBC: 0 % (ref 0.0–0.2)

## 2019-06-28 LAB — IRON AND TIBC
Iron: 58 ug/dL (ref 45–182)
Saturation Ratios: 16 % — ABNORMAL LOW (ref 17.9–39.5)
TIBC: 371 ug/dL (ref 250–450)
UIBC: 313 ug/dL

## 2019-06-28 LAB — TECHNOLOGIST SMEAR REVIEW

## 2019-06-28 LAB — VITAMIN B12: Vitamin B-12: 172 pg/mL — ABNORMAL LOW (ref 180–914)

## 2019-06-28 LAB — FOLATE: Folate: 13.2 ng/mL (ref >5.9)

## 2019-06-28 NOTE — Progress Notes (Signed)
New patient here for anemia evaluation.  He had a recent hospitalization due to GI bleed which required blood transfusion.

## 2019-06-29 ENCOUNTER — Encounter: Payer: Self-pay | Admitting: Oncology

## 2019-06-29 ENCOUNTER — Telehealth: Payer: Self-pay

## 2019-06-29 DIAGNOSIS — D500 Iron deficiency anemia secondary to blood loss (chronic): Secondary | ICD-10-CM

## 2019-06-29 HISTORY — DX: Iron deficiency anemia secondary to blood loss (chronic): D50.0

## 2019-06-29 LAB — KAPPA/LAMBDA LIGHT CHAINS
Kappa free light chain: 43.4 mg/L — ABNORMAL HIGH (ref 3.3–19.4)
Kappa, lambda light chain ratio: 1.38 (ref 0.26–1.65)
Lambda free light chains: 31.5 mg/L — ABNORMAL HIGH (ref 5.7–26.3)

## 2019-06-29 MED ORDER — VITAMIN B-12 1000 MCG PO TABS: 1000.0000 ug | ORAL_TABLET | Freq: Every day | ORAL | 1 refills | Status: DC

## 2019-06-29 NOTE — Telephone Encounter (Signed)
Sarah pharmacist with [facility] left v/m that pt was in hospital in Feb and Judson Roch was doing telephonic med reconciliation  And does not find amlodipine 5 mg on med list; pt cannot find amlodipine in his home; pts son [name] said was filled for 90 day supply in Feb but med cannot be found in pts home.  Judson Roch wants to know if pt should be taking amlodipine 5 mg daily or not. On pts historical med list has amlodipine 5 mg # 90 x 1 on 02/16/2019 but note also D/C by provider.?? Sarah request cb.

## 2019-06-29 NOTE — Telephone Encounter (Signed)
Spoken and notified Judson Roch of Tawni Millers comments. Sarah verbalized understanding.

## 2019-06-29 NOTE — Telephone Encounter (Signed)
If he has not been taking amlodipine then I recommend he continue off medication.  I do not see it on his current list, BP was in great control during his most recent visit.

## 2019-06-29 NOTE — Progress Notes (Signed)
Hematology/Oncology Consult note [facility] [phone] [phone]   Patient Care Team: [name], NP as PCP - General (Internal Medicine) [name], MD as PCP - Cardiology (Cardiology)  REFERRING PROVIDER: [name], NP CHIEF COMPLAINTS/REASON FOR VISIT:  Evaluation of iron deficiency anemia  HISTORY OF PRESENTING ILLNESS:  Leonard [name] is a  86 y.o.  male with PMH listed below was seen in consultation at the request of [name], NP   for evaluation of iron deficiency anemia.   Reviewed patient's recent labs  06/16/2019 labs revealed anemia with hemoglobin of 9.7.   Reviewed patient's previous labs ordered by primary care physician's office, anemia is acute onset , duration is since 05/18/2019.  Patient was recently admitted from 05/18/2019-05/25/2019 Patient developed acute blood loss anemia secondary to small intestinal AV malformations.  At that time patient was on chronic Xarelto.  Patient received multiple units of PRBC transfusions during his admission.  He underwent EGD, colonoscopy which showed no bleeding lesions or source of bleeding.  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  Patient underwent repeat EGD enteroscopy with cauterization of AVMs. Patient has paroxysmal atrial fibrillation, EF 35%.  History of recurrent pulmonary embolism, Xarelto was initially held due to the bleeding, Xarelto was resumed.  He reports no bloody or black stool recently  Associated signs and symptoms: Patient reports mild fatigue.  Denies SOB with exertion.  Denies weight loss, easy bruising, hematochezia, hemoptysis, hematuria.  Review of Systems  Constitutional: Positive for fatigue. Negative for appetite change, chills, fever and unexpected weight change.  HENT:   Negative for hearing loss and voice change.   Eyes: Negative for eye problems and icterus.  Respiratory: Negative for chest tightness,  cough and shortness of breath.   Cardiovascular: Negative for chest pain and leg swelling.  Gastrointestinal: Negative for abdominal distention and abdominal pain.  Endocrine: Negative for hot flashes.  Genitourinary: Negative for difficulty urinating, dysuria and frequency.   Musculoskeletal: Negative for arthralgias.  Skin: Negative for itching and rash.  Neurological: Negative for light-headedness and numbness.  Hematological: Negative for adenopathy. Does not bruise/bleed easily.  Psychiatric/Behavioral: Negative for confusion.    MEDICAL HISTORY:  Past Medical History:  Diagnosis Date  . Anemia   . Anxiety   . Arrhythmia    PAROXYSMAL ATRIAL FIBRILLATION  . Arthritis    OSTEOARTHRITIS  . Cataracts, bilateral   . Chronic back pain   . Coronary artery disease 8/11   CABG...LM EMERGENT WITH IABP sees Dr. Legrand Como cooper  . DJD (degenerative joint disease)   . DVT (deep venous thrombosis) ([place])   . Dyspnea   . GERD (gastroesophageal reflux disease)   . History of BPH   . Hyperlipidemia   . Hypertension   . Hypothyroidism   . Lumbar post-laminectomy syndrome   . Myocardial infarction ([place])   . Pulmonary embolism ([place]) 2009   hx of  . RBBB (right bundle branch block)   . Renal artery stenosis ([place])   . Thrombocytopenia ([place])   . Thyroid disease    HYPOTHYROIDISM  . Unstable angina ([place])    NONE IN LONG TIME    SURGICAL HISTORY: Past Surgical History:  Procedure Laterality Date  . Blood clot  Feb.20,  2016   Pulmonary Embolism  . CARDIOVERSION N/A 03/15/2019   Procedure: CARDIOVERSION;  Surgeon: Pixie Casino, MD;  Location[facility] ENDOSCOPY;  Service: Cardiovascular;  Laterality: N/A;  . CAROTID ENDARTERECTOMY Right Oct.  17, 2013   CE  . CAROTID ENDARTERECTOMY Left Dec. 3, 2016   CE  . CATARACT EXTRACTION  05/2014,06/2014  . COLONOSCOPY WITH PROPOFOL N/A 05/20/2019   Procedure: COLONOSCOPY WITH PROPOFOL;  Surgeon: Rush Landmark Telford Nab., MD;  Location: [place];  Service: Gastroenterology;  Laterality: N/A;  . CORONARY ARTERY BYPASS GRAFT  11-21-09   EMERGENT X 3 GRAFTING. ..[name], MD, cc: [name]  . ENDARTERECTOMY  01/28/2012   Procedure: ENDARTERECTOMY CAROTID;  Surgeon: [name], MD;  Location: [place];  Service: Vascular;  Laterality: Right;  with Primary Closure of Artery  . ENDARTERECTOMY  03/15/2012   Procedure: ENDARTERECTOMY CAROTID;  Surgeon: [name], MD;  Location: [place];  Service: Vascular;  Laterality: Left;  . ENTEROSCOPY N/A 05/23/2019   Procedure: ENTEROSCOPY;  Surgeon: [name], MD;  Location: [place];  Service: Endoscopy;  Laterality: N/A;  . ESOPHAGOGASTRODUODENOSCOPY N/A 05/20/2019   Procedure: ESOPHAGOGASTRODUODENOSCOPY (EGD);  Surgeon: [name]., MD;  Location: [place];  Service: Gastroenterology;  Laterality: N/A;  . ESOPHAGOGASTRODUODENOSCOPY (EGD) WITH PROPOFOL N/A 05/19/2019   Procedure: ESOPHAGOGASTRODUODENOSCOPY (EGD) WITH PROPOFOL;  Surgeon: [name], MD;  Location: [place];  Service: Endoscopy;  Laterality: N/A;  . FOOT SURGERY    . GIVENS CAPSULE STUDY N/A 05/20/2019   Procedure: GIVENS CAPSULE STUDY;  Surgeon: [name]., MD;  Location: [place];  Service: Gastroenterology;  Laterality: N/A;  . HOT HEMOSTASIS N/A 05/23/2019   Procedure: HOT HEMOSTASIS (ARGON PLASMA COAGULATION/BICAP);  Surgeon: [name], MD;  Location: [place];  Service: Endoscopy;  Laterality: N/A;  . MANDIBLE SURGERY    . MAXIMUM ACCESS (MAS)POSTERIOR LUMBAR INTERBODY FUSION (PLIF) 2 LEVEL N/A 08/08/2015   Procedure: Lumbar Four-Five, Lumbar Five-Sacral One Maximum access posterior lumbar interbody fusion;  Surgeon: [name], MD;  Location: [place] NEURO ORS;  Service: Neurosurgery;  Laterality: N/A;  LUMBAR FOUR-FIVE ,LUMBAR FIVE -SACRAL Maximum access posterior lumbar interbody fusion  . POLYPECTOMY  05/20/2019   Procedure: POLYPECTOMY;  Surgeon:  [name], Telford Nab., MD;  Location: [facility] ENDOSCOPY;  Service: Gastroenterology;;  . RENAL ARTERY STENT  2010  . REPLACEMENT TOTAL KNEE  2006   RIGHT  . SPINAL CORD STIMULATOR INSERTION N/A 06/18/2017   Procedure: LUMBAR SPINAL CORD STIMULATOR INSERTION;  Surgeon: [name], MD;  Location: [place];  Service: Neurosurgery;  Laterality: N/A;  LUMBAR SPINAL CORD STIMULATOR INSERTION  . TOTAL HIP ARTHROPLASTY     right    SOCIAL HISTORY: Social History   Socioeconomic History  . Marital status: Married    Spouse name: Not on file  . Number of children: 2  . Years of education: Not on file  . Highest education level: Not on file  Occupational History  . 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 Smoking status: Former Smoker    Types: Cigarettes    Quit date: 04/13/1974    Years since quitting: 45.2  . Smokeless tobacco: Former Systems developer    Types: Chew    Quit date: 01/26/2002  . Tobacco comment: He has smoked about 27-pack-year hx   Substance and Sexual Activity  . Alcohol use: Yes    Alcohol/week: 2.0 standard drinks    Types: 2 Cans of beer per week    Comment: He used to drink more heavily , but rarely drinks at the current time  . Drug use: No  . Sexual  activity: Not on file  Other Topics Concern  . Not on file  Social History Narrative   LIVES IN [place] WITH HIS WIFE.   HE HAS 2 GROWN CHILDREN   HE IS RETIRED PRISON FIRM SUPERINTENDENT.   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   HE GOES TO THE [employer] 3 X WEEKLY.   Social Determinants of Health   Financial Resource Strain:   . Difficulty of Paying Living Expenses:   Food Insecurity:   . Worried About Charity fundraiser in the Last Year:   . Arboriculturist in the Last Year:   Transportation Needs:   . Film/video editor (Medical):   Marland Kitchen Lack of Transportation (Non-Medical):   Physical Activity:   .  Days of Exercise per Week:   . Minutes of Exercise per Session:   Stress:   . Feeling of Stress :   Social Connections:   . Frequency of Communication with Friends and Family:   . Frequency of Social Gatherings with Friends and Family:   . Attends Religious Services:   . Active Member of Clubs or Organizations:   . Attends Archivist Meetings:   Marland Kitchen Marital Status:   Intimate Partner Violence:   . Fear of Current or Ex-Partner:   . Emotionally Abused:   Marland Kitchen Physically Abused:   . Sexually Abused:     FAMILY HISTORY: Family History  Problem Relation Age of Onset  . Lung cancer Father 72       deceased   . Cancer Father 57       LUNG  . Stroke Mother 46       DECEASED   . Hyperlipidemia Mother   . Heart attack Brother 81       deceased  . Diabetes Brother   . Liver cancer Sister 81       deceased   . Cancer Sister     ALLERGIES:  has No Known Allergies.  MEDICATIONS:  Current Outpatient Medications  Medication Sig Dispense Refill  . amiodarone (PACERONE) 200 MG tablet Take 1 tablet by mouth twice daily for 7 days, then take 1 tablet by mouth daily. 97 tablet 3  . AMITIZA 24 MCG capsule Take 1 capsule (24 mcg total) by mouth 2 (two) times daily. As needed for constipation. 60 capsule 0  . cetirizine (ZYRTEC) 10 MG tablet TAKE 1 TABLET BY MOUTH ONCE DAILY 30 tablet 2  . docusate sodium (COLACE) 100 MG capsule Take 2 capsules (200 mg total) by mouth daily. 10 capsule 0  . furosemide (LASIX) 40 MG tablet Take 1 tablet (40 mg total) by mouth daily. 90 tablet 1  . guaiFENesin (MUCINEX) 600 MG 12 hr tablet Take 600 mg by mouth 2 (two) times daily as needed for cough or to loosen phlegm.    Marland Kitchen levothyroxine (SYNTHROID) 112 MCG tablet Take 112 mcg by mouth daily before breakfast.    . metoprolol succinate (TOPROL XL) 25 MG 24 hr tablet Take 1 tablet (25 mg total) by mouth daily. 90 tablet 3  . Rivaroxaban (XARELTO) 15 MG TABS tablet Take 1 tablet (15 mg total) by mouth  daily with supper. 30 tablet 2  . rosuvastatin (CRESTOR) 10 MG tablet TAKE ONE TABLET BY MOUTH IN THE EVENING (Patient taking differently: Take 10 mg by mouth every evening. ) 30 tablet 0  . tamsulosin (FLOMAX) 0.4 MG CAPS capsule TAKE 1 CAPSULE BY  MOUTH ONCE DAILY (Patient taking differently: Take 0.4 mg by mouth daily. ) 90 capsule 1  . triamcinolone cream (KENALOG) 0.1 % Apply 1 application topically 2 (two) times daily as needed (affected area(s) of ankle.).    Marland Kitchen mometasone (NASONEX) 50 MCG/ACT nasal spray Place 2 sprays into the nose daily as needed (for allergies).    . Multiple Vitamin (MULTIVITAMIN WITH MINERALS) TABS tablet Take 1 tablet by mouth daily.     No current facility-administered medications for this visit.     PHYSICAL EXAMINATION: ECOG PERFORMANCE STATUS: 1 - Symptomatic but completely ambulatory Vitals:   06/28/19 0939  BP: 121/65  Pulse: 76  Resp: 18  Temp: (!) 97 F (36.1 C)   Filed Weights   06/28/19 0939  Weight: 227 lb 9.6 oz (103.2 kg)    Physical Exam Constitutional:      General: He is not in acute distress. HENT:     Head: Normocephalic and atraumatic.  Eyes:     General: No scleral icterus. Cardiovascular:     Rate and Rhythm: Normal rate and regular rhythm.     Heart sounds: Normal heart sounds.  Pulmonary:     Effort: Pulmonary effort is normal. No respiratory distress.     Breath sounds: No wheezing.  Abdominal:     General: Bowel sounds are normal. There is no distension.     Palpations: Abdomen is soft.  Musculoskeletal:        General: No deformity. Normal range of motion.     Cervical back: Normal range of motion and neck supple.  Skin:    General: Skin is warm and dry.     Findings: No erythema or rash.  Neurological:     Mental Status: He is alert and oriented to person, place, and time. Mental status is at baseline.     Cranial Nerves: No cranial nerve deficit.     Coordination: Coordination normal.  Psychiatric:         Mood and Affect: Mood normal.       CMP Latest Ref Rng & Units 05/25/2019  Glucose 70 - 99 mg/dL 104(H)  BUN 8 - 23 mg/dL 12  Creatinine 0.61 - 1.24 mg/dL 1.51(H)  Sodium 135 - 145 mmol/L 141  Potassium 3.5 - 5.1 mmol/L 3.6  Chloride 98 - 111 mmol/L 107  CO2 22 - 32 mmol/L 24  Calcium 8.9 - 10.3 mg/dL 8.1(L)  Total Protein 6.5 - 8.1 g/dL -  Total Bilirubin 0.3 - 1.2 mg/dL -  Alkaline Phos 38 - 126 U/L -  AST 15 - 41 U/L -  ALT 0 - 44 U/L -   CBC Latest Ref Rng & Units 06/28/2019  WBC 4.0 - 10.5 K/uL 5.7  Hemoglobin 13.0 - 17.0 g/dL 11.0(L)  Hematocrit 39.0 - 52.0 % 35.8(L)  Platelets 150 - 400 K/uL 224     LABORATORY DATA:  I have reviewed the data as listed Lab Results  Component Value Date   WBC 5.7 06/28/2019   HGB 11.0 (L) 06/28/2019   HCT 35.8 (L) 06/28/2019   MCV 95.5 06/28/2019   PLT 224 06/28/2019   Recent Labs    05/19/19 0002 05/19/19 0002 05/20/19 0450 05/21/19 0556 05/25/19 0244  NA 138   < > 141 139 141  K 3.7   < > 4.0 4.1 3.6  CL 104   < > 108 105 107  CO2 25   < > '25 25 24  ' GLUCOSE 98   < >  104* 108* 104*  BUN 35*   < > 24* 18 12  CREATININE 1.58*   < > 1.60* 1.63* 1.51*  CALCIUM 8.6*   < > 8.4* 8.5* 8.1*  GFRNONAA 39*   < > 38* 37* 41*  GFRAA 45*   < > 44* 43* 47*  PROT 5.1*  --  4.9* 5.0*  --   ALBUMIN 2.9*  --  2.7* 2.8*  --   AST 20  --  22 27  --   ALT 16  --  15 17  --   ALKPHOS 45  --  42 52  --   BILITOT 1.2  --  1.0 0.7  --    < > = values in this interval not displayed.   Iron/TIBC/Ferritin/ %Sat    Component Value Date/Time   IRON 58 06/28/2019 1009   TIBC 371 06/28/2019 1009   FERRITIN 31 06/28/2019 1009   IRONPCTSAT 16 (L) 06/28/2019 1009     RADIOGRAPHIC STUDIES: I have personally reviewed the radiological images as listed and agreed with the findings in the report. DG Chest 2 View  Result Date: 05/18/2019 CLINICAL DATA:  Chest pain EXAM: CHEST - 2 VIEW COMPARISON:  2016 FINDINGS: No new consolidation or edema.  Mild scarring at the left lung base. No pleural effusion or pneumothorax. Stable cardiomediastinal contours. Interval placement of spinal stimulator. IMPRESSION: No acute process in the chest. Electronically Signed   By: Macy Mis M.D.   On: 05/18/2019 14:49       ASSESSMENT & PLAN:  1. Iron deficiency anemia due to chronic blood loss   2. Anemia due to stage 3b chronic kidney disease   3. B12 deficiency   Check CBC,  iron, TIBC ferritin. Labs are reviewed.  06/28/2019, blood work showed improved hemoglobin to 11.5, iron saturation 16, ferritin 31. In the context of CKD stage 3, I recommend to further improve his iron store  I recommend IV Venofer 200 mg x 3.  Allergy reactions/infusion reaction including anaphylactic reaction discussed with patient. Other side effects include but not limited to high blood pressure, skin rash, weight gain, leg swelling, etc. Patient voices understanding and willing to proceed. CKD check multiple myeloma panel and light chain Vitamin B12 deficiency, recommend patient start taking oral vitamin B12 1000 MCG daily.  Orders Placed This Encounter  Procedures  . Ferritin    Standing Status:   Future    Number of Occurrences:   1    Standing Expiration Date:   12/28/2020  . Iron and TIBC    Standing Status:   Future    Number of Occurrences:   1    Standing Expiration Date:   12/28/2020  . Folate    Standing Status:   Future    Number of Occurrences:   1    Standing Expiration Date:   12/28/2020  . Vitamin B12    Standing Status:   Future    Number of Occurrences:   1    Standing Expiration Date:   12/28/2020  . CBC with Differential/Platelet    Standing Status:   Future    Number of Occurrences:   1    Standing Expiration Date:   12/28/2020  . Multiple Myeloma Panel (SPEP&IFE w/QIG)    Standing Status:   Future    Number of Occurrences:   1    Standing Expiration Date:   12/28/2020  . Kappa/lambda light chains    Standing Status:   Future  Number of Occurrences:   1    Standing Expiration Date:   12/28/2020  . Technologist smear review    Standing Status:   Future    Number of Occurrences:   1    Standing Expiration Date:   06/27/2020    All questions were answered. The patient knows to call the clinic with any problems questions or concerns.  Cc [name], NP  Return of visit:  3 months.   Earlie Server, MD, PhD Hematology Oncology [place] at [facility] 06/29/2019

## 2019-06-30 ENCOUNTER — Telehealth: Payer: Self-pay

## 2019-06-30 DIAGNOSIS — Z7901 Long term (current) use of anticoagulants: Secondary | ICD-10-CM

## 2019-06-30 DIAGNOSIS — M4316 Spondylolisthesis, lumbar region: Secondary | ICD-10-CM

## 2019-06-30 DIAGNOSIS — M961 Postlaminectomy syndrome, not elsewhere classified: Secondary | ICD-10-CM

## 2019-06-30 DIAGNOSIS — F419 Anxiety disorder, unspecified: Secondary | ICD-10-CM

## 2019-06-30 DIAGNOSIS — E785 Hyperlipidemia, unspecified: Secondary | ICD-10-CM

## 2019-06-30 DIAGNOSIS — Z86711 Personal history of pulmonary embolism: Secondary | ICD-10-CM

## 2019-06-30 DIAGNOSIS — D696 Thrombocytopenia, unspecified: Secondary | ICD-10-CM

## 2019-06-30 DIAGNOSIS — I4510 Unspecified right bundle-branch block: Secondary | ICD-10-CM

## 2019-06-30 DIAGNOSIS — I2510 Atherosclerotic heart disease of native coronary artery without angina pectoris: Secondary | ICD-10-CM

## 2019-06-30 DIAGNOSIS — Z9181 History of falling: Secondary | ICD-10-CM

## 2019-06-30 DIAGNOSIS — K5790 Diverticulosis of intestine, part unspecified, without perforation or abscess without bleeding: Secondary | ICD-10-CM

## 2019-06-30 DIAGNOSIS — Z96651 Presence of right artificial knee joint: Secondary | ICD-10-CM

## 2019-06-30 DIAGNOSIS — M1991 Primary osteoarthritis, unspecified site: Secondary | ICD-10-CM

## 2019-06-30 DIAGNOSIS — I701 Atherosclerosis of renal artery: Secondary | ICD-10-CM

## 2019-06-30 DIAGNOSIS — I480 Paroxysmal atrial fibrillation: Secondary | ICD-10-CM | POA: Diagnosis not present

## 2019-06-30 DIAGNOSIS — I739 Peripheral vascular disease, unspecified: Secondary | ICD-10-CM

## 2019-06-30 DIAGNOSIS — G629 Polyneuropathy, unspecified: Secondary | ICD-10-CM

## 2019-06-30 DIAGNOSIS — I5020 Unspecified systolic (congestive) heart failure: Secondary | ICD-10-CM | POA: Diagnosis not present

## 2019-06-30 DIAGNOSIS — Z86718 Personal history of other venous thrombosis and embolism: Secondary | ICD-10-CM

## 2019-06-30 DIAGNOSIS — E039 Hypothyroidism, unspecified: Secondary | ICD-10-CM

## 2019-06-30 DIAGNOSIS — D62 Acute posthemorrhagic anemia: Secondary | ICD-10-CM | POA: Diagnosis not present

## 2019-06-30 DIAGNOSIS — Z87891 Personal history of nicotine dependence: Secondary | ICD-10-CM

## 2019-06-30 DIAGNOSIS — Z951 Presence of aortocoronary bypass graft: Secondary | ICD-10-CM

## 2019-06-30 DIAGNOSIS — N400 Enlarged prostate without lower urinary tract symptoms: Secondary | ICD-10-CM

## 2019-06-30 DIAGNOSIS — I252 Old myocardial infarction: Secondary | ICD-10-CM

## 2019-06-30 DIAGNOSIS — K219 Gastro-esophageal reflux disease without esophagitis: Secondary | ICD-10-CM

## 2019-06-30 DIAGNOSIS — I110 Hypertensive heart disease with heart failure: Secondary | ICD-10-CM | POA: Diagnosis not present

## 2019-06-30 DIAGNOSIS — Z96641 Presence of right artificial hip joint: Secondary | ICD-10-CM

## 2019-06-30 NOTE — Telephone Encounter (Signed)
-----   Message from Earlie Server, MD sent at 06/29/2019  8:17 PM EDT ----- Please let patient know that his blood level has improved, iron is still a little low.  Recommend IV venofer weekly x 3.  Also B12 deficiency, recommend B12 supplements. Rx was sent to pharmacy.  Follow up plan. Lab in 3 months, MD and +/- Venofer or retacrit 1-2 days late. Thanks.

## 2019-06-30 NOTE — Telephone Encounter (Addendum)
Done...  Pt appts has been sched a requseted, With  1st dose Weekly Venofer starting on 07/04/19 @ 130. AVS will be mailed out as well

## 2019-06-30 NOTE — Telephone Encounter (Signed)
Informed patients son

## 2019-07-02 DIAGNOSIS — K5900 Constipation, unspecified: Secondary | ICD-10-CM

## 2019-07-03 LAB — MULTIPLE MYELOMA PANEL, SERUM
Albumin SerPl Elph-Mcnc: 3.2 g/dL (ref 2.9–4.4)
Albumin/Glob SerPl: 1.2 (ref 0.7–1.7)
Alpha 1: 0.2 g/dL (ref 0.0–0.4)
Alpha2 Glob SerPl Elph-Mcnc: 0.7 g/dL (ref 0.4–1.0)
B-Globulin SerPl Elph-Mcnc: 1 g/dL (ref 0.7–1.3)
Gamma Glob SerPl Elph-Mcnc: 0.7 g/dL (ref 0.4–1.8)
Globulin, Total: 2.7 g/dL (ref 2.2–3.9)
IgA: 296 mg/dL (ref 61–437)
IgG (Immunoglobin G), Serum: 769 mg/dL (ref 603–1613)
IgM (Immunoglobulin M), Srm: 28 mg/dL (ref 15–143)
Total Protein ELP: 5.9 g/dL — ABNORMAL LOW (ref 6.0–8.5)

## 2019-07-03 MED ORDER — LINACLOTIDE 145 MCG PO CAPS: 145.0000 ug | ORAL_CAPSULE | Freq: Every day | ORAL | 0 refills | Status: DC

## 2019-07-04 ENCOUNTER — Inpatient Hospital Stay: Payer: Medicare Other

## 2019-07-04 ENCOUNTER — Other Ambulatory Visit: Payer: Self-pay

## 2019-07-04 VITALS — BP 143/69 | HR 60 | Temp 97.5°F | Resp 18

## 2019-07-04 DIAGNOSIS — I2699 Other pulmonary embolism without acute cor pulmonale: Secondary | ICD-10-CM | POA: Diagnosis not present

## 2019-07-04 DIAGNOSIS — E538 Deficiency of other specified B group vitamins: Secondary | ICD-10-CM | POA: Diagnosis not present

## 2019-07-04 DIAGNOSIS — I480 Paroxysmal atrial fibrillation: Secondary | ICD-10-CM | POA: Diagnosis not present

## 2019-07-04 DIAGNOSIS — Z87891 Personal history of nicotine dependence: Secondary | ICD-10-CM | POA: Diagnosis not present

## 2019-07-04 DIAGNOSIS — Z809 Family history of malignant neoplasm, unspecified: Secondary | ICD-10-CM | POA: Diagnosis not present

## 2019-07-04 DIAGNOSIS — Z801 Family history of malignant neoplasm of trachea, bronchus and lung: Secondary | ICD-10-CM | POA: Diagnosis not present

## 2019-07-04 DIAGNOSIS — Z808 Family history of malignant neoplasm of other organs or systems: Secondary | ICD-10-CM | POA: Diagnosis not present

## 2019-07-04 DIAGNOSIS — N1832 Chronic kidney disease, stage 3b: Secondary | ICD-10-CM | POA: Diagnosis not present

## 2019-07-04 DIAGNOSIS — D631 Anemia in chronic kidney disease: Secondary | ICD-10-CM | POA: Diagnosis not present

## 2019-07-04 DIAGNOSIS — K922 Gastrointestinal hemorrhage, unspecified: Secondary | ICD-10-CM | POA: Diagnosis not present

## 2019-07-04 DIAGNOSIS — D500 Iron deficiency anemia secondary to blood loss (chronic): Secondary | ICD-10-CM

## 2019-07-04 DIAGNOSIS — Z7901 Long term (current) use of anticoagulants: Secondary | ICD-10-CM | POA: Diagnosis not present

## 2019-07-04 MED ORDER — IRON SUCROSE 20 MG/ML IV SOLN
200.0000 mg | Freq: Once | INTRAVENOUS | Status: AC
  Administered 2019-07-04: 200 mg via INTRAVENOUS
  Filled 2019-07-04: qty 10

## 2019-07-04 MED ORDER — SODIUM CHLORIDE 0.9 % IV SOLN
Freq: Once | INTRAVENOUS | Status: AC
  Filled 2019-07-04: qty 250

## 2019-07-11 ENCOUNTER — Other Ambulatory Visit: Payer: Self-pay

## 2019-07-11 ENCOUNTER — Inpatient Hospital Stay: Payer: Medicare Other

## 2019-07-11 VITALS — BP 142/59 | HR 52 | Temp 98.0°F | Resp 18 | Wt 223.8 lb

## 2019-07-11 DIAGNOSIS — I2699 Other pulmonary embolism without acute cor pulmonale: Secondary | ICD-10-CM | POA: Diagnosis not present

## 2019-07-11 DIAGNOSIS — D500 Iron deficiency anemia secondary to blood loss (chronic): Secondary | ICD-10-CM | POA: Diagnosis not present

## 2019-07-11 DIAGNOSIS — Z87891 Personal history of nicotine dependence: Secondary | ICD-10-CM | POA: Diagnosis not present

## 2019-07-11 DIAGNOSIS — D631 Anemia in chronic kidney disease: Secondary | ICD-10-CM | POA: Diagnosis not present

## 2019-07-11 DIAGNOSIS — Z809 Family history of malignant neoplasm, unspecified: Secondary | ICD-10-CM | POA: Diagnosis not present

## 2019-07-11 DIAGNOSIS — E538 Deficiency of other specified B group vitamins: Secondary | ICD-10-CM | POA: Diagnosis not present

## 2019-07-11 DIAGNOSIS — Z808 Family history of malignant neoplasm of other organs or systems: Secondary | ICD-10-CM | POA: Diagnosis not present

## 2019-07-11 DIAGNOSIS — K922 Gastrointestinal hemorrhage, unspecified: Secondary | ICD-10-CM | POA: Diagnosis not present

## 2019-07-11 DIAGNOSIS — Z801 Family history of malignant neoplasm of trachea, bronchus and lung: Secondary | ICD-10-CM | POA: Diagnosis not present

## 2019-07-11 DIAGNOSIS — I480 Paroxysmal atrial fibrillation: Secondary | ICD-10-CM | POA: Diagnosis not present

## 2019-07-11 DIAGNOSIS — Z7901 Long term (current) use of anticoagulants: Secondary | ICD-10-CM | POA: Diagnosis not present

## 2019-07-11 DIAGNOSIS — N1832 Chronic kidney disease, stage 3b: Secondary | ICD-10-CM | POA: Diagnosis not present

## 2019-07-11 MED ORDER — IRON SUCROSE 20 MG/ML IV SOLN
200.0000 mg | Freq: Once | INTRAVENOUS | Status: AC
  Administered 2019-07-11: 200 mg via INTRAVENOUS
  Filled 2019-07-11: qty 10

## 2019-07-11 MED ORDER — SODIUM CHLORIDE 0.9 % IV SOLN
Freq: Once | INTRAVENOUS | Status: AC
  Filled 2019-07-11: qty 250

## 2019-07-17 ENCOUNTER — Telehealth: Payer: Self-pay | Admitting: Primary Care

## 2019-07-17 DIAGNOSIS — E785 Hyperlipidemia, unspecified: Secondary | ICD-10-CM

## 2019-07-18 ENCOUNTER — Inpatient Hospital Stay: Payer: Medicare Other | Attending: Oncology

## 2019-07-18 ENCOUNTER — Other Ambulatory Visit: Payer: Self-pay

## 2019-07-18 VITALS — BP 135/50 | HR 56 | Resp 18

## 2019-07-18 DIAGNOSIS — K922 Gastrointestinal hemorrhage, unspecified: Secondary | ICD-10-CM | POA: Diagnosis not present

## 2019-07-18 DIAGNOSIS — D500 Iron deficiency anemia secondary to blood loss (chronic): Secondary | ICD-10-CM | POA: Diagnosis not present

## 2019-07-18 MED ORDER — IRON SUCROSE 20 MG/ML IV SOLN
200.0000 mg | Freq: Once | INTRAVENOUS | Status: AC
  Administered 2019-07-18: 200 mg via INTRAVENOUS
  Filled 2019-07-18: qty 10

## 2019-07-18 MED ORDER — SODIUM CHLORIDE 0.9 % IV SOLN
Freq: Once | INTRAVENOUS | Status: AC
  Filled 2019-07-18: qty 250

## 2019-07-19 ENCOUNTER — Other Ambulatory Visit: Payer: Self-pay | Admitting: Primary Care

## 2019-07-19 NOTE — Telephone Encounter (Signed)
Ok to refill? Last prescribed was levothyroxine 125 mcg but was change to 112 mcg at the hospital. Last appointment on 05/31/2019. No future appointment

## 2019-07-20 NOTE — Telephone Encounter (Signed)
Last prescribed on 04/19/2019 . Last appointment on  05/31/2019. No future appointment

## 2019-07-20 NOTE — Telephone Encounter (Signed)
Patient needs a general medical follow up visit with me. Please have his family schedule this at their convenience. This can be virtual or in person, but he will need to have some updated labs.

## 2019-07-20 NOTE — Telephone Encounter (Signed)
Needs follow up visit with labs sooner than later. TSH was too high, not sure why his levothyroxine was decreased, this isn't adding up.  Vallarie Mare, clarify with patient's son on the dose he's actually taking. 125 or 115mcg?  He needs to be seen for general follow up with labs. Please set this up.

## 2019-07-21 NOTE — Telephone Encounter (Signed)
Message left for patient to return my call.  

## 2019-07-25 NOTE — Telephone Encounter (Signed)
Left a message earlier today but looks like they schedule an appointment on Friday 07/28/2019

## 2019-07-25 NOTE — Telephone Encounter (Signed)
Pt is scheduled for follow up on 4/16

## 2019-07-27 DIAGNOSIS — N2581 Secondary hyperparathyroidism of renal origin: Secondary | ICD-10-CM | POA: Diagnosis not present

## 2019-07-27 DIAGNOSIS — N183 Chronic kidney disease, stage 3 unspecified: Secondary | ICD-10-CM | POA: Diagnosis not present

## 2019-07-27 DIAGNOSIS — I129 Hypertensive chronic kidney disease with stage 1 through stage 4 chronic kidney disease, or unspecified chronic kidney disease: Secondary | ICD-10-CM | POA: Diagnosis not present

## 2019-07-27 LAB — BASIC METABOLIC PANEL
BUN: 17 (ref 4–21)
CO2: 28 — AB (ref 13–22)
Chloride: 105 (ref 99–108)
Creatinine: 1.5 — AB (ref 0.6–1.3)
Glucose: 114
Potassium: 4.1 (ref 3.4–5.3)
Sodium: 138 (ref 137–147)

## 2019-07-27 LAB — COMPREHENSIVE METABOLIC PANEL
Albumin: 4 (ref 3.5–5.0)
Calcium: 8.9 (ref 8.7–10.7)
GFR calc Af Amer: 48
GFR calc non Af Amer: 42

## 2019-07-27 LAB — VITAMIN D 25 HYDROXY (VIT D DEFICIENCY, FRACTURES): Vit D, 25-Hydroxy: 25.9

## 2019-07-28 ENCOUNTER — Encounter: Payer: Self-pay | Admitting: Primary Care

## 2019-07-28 ENCOUNTER — Ambulatory Visit (INDEPENDENT_AMBULATORY_CARE_PROVIDER_SITE_OTHER): Payer: Medicare Other | Admitting: Primary Care

## 2019-07-28 ENCOUNTER — Other Ambulatory Visit: Payer: Self-pay

## 2019-07-28 VITALS — BP 120/62 | HR 72 | Temp 96.9°F | Ht 72.0 in | Wt 225.8 lb

## 2019-07-28 DIAGNOSIS — D500 Iron deficiency anemia secondary to blood loss (chronic): Secondary | ICD-10-CM

## 2019-07-28 DIAGNOSIS — E119 Type 2 diabetes mellitus without complications: Secondary | ICD-10-CM

## 2019-07-28 DIAGNOSIS — E038 Other specified hypothyroidism: Secondary | ICD-10-CM

## 2019-07-28 DIAGNOSIS — E782 Mixed hyperlipidemia: Secondary | ICD-10-CM | POA: Diagnosis not present

## 2019-07-28 DIAGNOSIS — K5900 Constipation, unspecified: Secondary | ICD-10-CM

## 2019-07-28 HISTORY — DX: Type 2 diabetes mellitus without complications: E11.9

## 2019-07-28 LAB — LIPID PANEL
Cholesterol: 129 mg/dL (ref 0–200)
HDL: 40.5 mg/dL (ref >39.00)
LDL Cholesterol: 70 mg/dL (ref 0–99)
NonHDL: 88.86
Total CHOL/HDL Ratio: 3
Triglycerides: 93 mg/dL (ref 0.0–149.0)
VLDL: 18.6 mg/dL (ref 0.0–40.0)

## 2019-07-28 LAB — TSH: TSH: 0.15 u[IU]/mL — ABNORMAL LOW (ref 0.35–4.50)

## 2019-07-28 LAB — HEMOGLOBIN A1C: Hgb A1c MFr Bld: 5.4 % (ref 4.6–6.5)

## 2019-07-28 NOTE — Assessment & Plan Note (Signed)
A1C of 6.5 on labs from February 2020, not on medication due to age and frailty. Repeat A1C pending.

## 2019-07-28 NOTE — Assessment & Plan Note (Signed)
Compliant to levothyroxine 112 mcg. He is taking levothyroxine correctly for the most part, except he is taking oral iron within 30 min. Discussed to take oral iron and any other vitamins at bedtime.  Repeat TSH pending. We may need to repeat TSH again two months later after he makes that adjustment.

## 2019-07-28 NOTE — Assessment & Plan Note (Signed)
Having too frequent of bowel movements on Linzess and prune juice. Given liquid consistency we are fearful of dehydration and electrolyte imbalance. Discontinue Liziness. Continue Prune juice as needed. He will update.

## 2019-07-28 NOTE — Progress Notes (Signed)
Subjective:    Patient ID: Leonard Footman Berg., male    DOB: 1930/01/08, 86 y.o.   MRN: [id number]  HPI  This visit occurred during the SARS-CoV-2 public health emergency.  Safety protocols were in place, including screening questions prior to the visit, additional usage of staff PPE, and extensive cleaning of exam room while observing appropriate contact time as indicated for disinfecting solutions.   Leonard Berg is a 86 year old male with a history of hypertension, unstable angina, CAD, paroxysmal atrial fibrillation, PE, GIB, osteoarthritis, BPH, hyperlipidemia who presents today for follow up and labs.  He was last evaluated in mid February 2021 for hospital follow up after admission for GI bleeding.  Currently following with hematology, last visit being mid March 2021, decision was to move forward with iron replenishment and oral B12.    TSH during hospital stay in February 2021 of 8. He is compliant to levothyroxine 112 mcg. He is taking his levothyroxine in the morning with water, he is waiting to eat and take other medications for 30 minutes. He is taking his iron pill 30 minutes later.   Amitiza not covered by insurance for constipation. He is back on Linzess, also taking prune juice, having bowel movements 3-5 times daily, mostly liquid stools.   BP Readings from Last 3 Encounters:  07/28/19 120/62  07/18/19 (!) 135/50  07/11/19 (!) 142/59     Review of Systems  Constitutional:       Fatigue improved  Respiratory: Negative for shortness of breath.   Cardiovascular: Negative for chest pain.  Musculoskeletal: Positive for arthralgias.  Neurological: Negative for dizziness.       Past Medical History:  Diagnosis Date  . Anemia   . Anxiety   . Arrhythmia    PAROXYSMAL ATRIAL FIBRILLATION  . Arthritis    OSTEOARTHRITIS  . Cataracts, bilateral   . Chronic back pain   . Coronary artery disease 8/11   CABG...LM EMERGENT WITH IABP sees Dr. Legrand Como cooper  .  DJD (degenerative joint disease)   . DVT (deep venous thrombosis) ([place])   . Dyspnea   . GERD (gastroesophageal reflux disease)   . History of BPH   . Hyperlipidemia   . Hypertension   . Hypothyroidism   . Iron deficiency anemia due to chronic blood loss 06/29/2019  . Lumbar post-laminectomy syndrome   . Myocardial infarction ([place])   . Pulmonary embolism ([place]) 2009   hx of  . RBBB (right bundle branch block)   . Renal artery stenosis ([place])   . Thrombocytopenia ([place])   . Thyroid disease    HYPOTHYROIDISM  . Unstable angina ([facility])    NONE IN LONG TIME     Social History   Socioeconomic History  . Marital status: Married    Spouse name: Not on file  . Number of children: 2  . Years of education: Not on file  . Highest education level: Not on file  Occupational History  . Occupation: RETIRED     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, [employer] HE STILL WORKS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  Tobacco Use  . Smoking status: Former Smoker    Types: Cigarettes    Quit date: 04/13/1974    Years since quitting: 45.3  . Smokeless tobacco: Former Systems developer    Types: Chew    Quit date: 01/26/2002  . Tobacco comment: He has smoked about 27-pack-year hx   Substance and Sexual Activity  . Alcohol use: Yes    Alcohol/week: 2.0  standard drinks    Types: 2 Cans of beer per week    Comment: He used to drink more heavily , but rarely drinks at the current time  . Drug use: No  . Sexual activity: Not on file  Other Topics Concern  . Not on file  Social History Narrative   LIVES IN [place] WITH HIS WIFE.   HE HAS 2 GROWN CHILDREN   HE IS RETIRED PRISON FIRM SUPERINTENDENT.   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   HE GOES TO THE [employer] 3 X WEEKLY.   Social Determinants of Health   Financial Resource Strain:   . Difficulty of Paying Living Expenses:   Food Insecurity:   . Worried About Charity fundraiser in the Last Year:   . Arboriculturist in the Last Year:     Transportation Needs:   . Film/video editor (Medical):   Marland Kitchen Lack of Transportation (Non-Medical):   Physical Activity:   . Days of Exercise per Week:   . Minutes of Exercise per Session:   Stress:   . Feeling of Stress :   Social Connections:   . Frequency of Communication with Friends and Family:   . Frequency of Social Gatherings with Friends and Family:   . Attends Religious Services:   . Active Member of Clubs or Organizations:   . Attends Archivist Meetings:   Marland Kitchen Marital Status:   Intimate Partner Violence:   . Fear of Current or Ex-Partner:   . Emotionally Abused:   Marland Kitchen Physically Abused:   . Sexually Abused:     Past Surgical History:  Procedure Laterality Date  . Blood clot  Feb.20,  2016   Pulmonary Embolism  . CARDIOVERSION N/A 03/15/2019   Procedure: CARDIOVERSION;  Surgeon: Pixie Casino, MD;  Location: [facility] ENDOSCOPY;  Service: Cardiovascular;  Laterality: N/A;  . CAROTID ENDARTERECTOMY Right Oct. 17, 2013   CE  . CAROTID ENDARTERECTOMY Left Dec. 3, 2016   CE  . CATARACT EXTRACTION  05/2014,06/2014  . COLONOSCOPY WITH PROPOFOL N/A 05/20/2019   Procedure: COLONOSCOPY WITH PROPOFOL;  Surgeon: Rush Landmark Telford Nab., MD;  Location: [place];  Service: Gastroenterology;  Laterality: N/A;  . CORONARY ARTERY BYPASS GRAFT  11-21-09   EMERGENT X 3 GRAFTING. ..[name], MD, cc: [name]  . ENDARTERECTOMY  01/28/2012   Procedure: ENDARTERECTOMY CAROTID;  Surgeon: [name], MD;  Location: [place];  Service: Vascular;  Laterality: Right;  with Primary Closure of Artery  . ENDARTERECTOMY  03/15/2012   Procedure: ENDARTERECTOMY CAROTID;  Surgeon: [name], MD;  Location: [place];  Service: Vascular;  Laterality: Left;  . ENTEROSCOPY N/A 05/23/2019   Procedure: ENTEROSCOPY;  Surgeon: [name], MD;  Location: [place];  Service: Endoscopy;  Laterality: N/A;  . ESOPHAGOGASTRODUODENOSCOPY N/A 05/20/2019   Procedure:  ESOPHAGOGASTRODUODENOSCOPY (EGD);  Surgeon: [name]., MD;  Location: [place];  Service: Gastroenterology;  Laterality: N/A;  . ESOPHAGOGASTRODUODENOSCOPY (EGD) WITH PROPOFOL N/A 05/19/2019   Procedure: ESOPHAGOGASTRODUODENOSCOPY (EGD) WITH PROPOFOL;  Surgeon: [name], MD;  Location: [place];  Service: Endoscopy;  Laterality: N/A;  . FOOT SURGERY    . GIVENS CAPSULE STUDY N/A 05/20/2019   Procedure: GIVENS CAPSULE STUDY;  Surgeon: [name]., MD;  Location: [place];  Service: Gastroenterology;  Laterality: N/A;  . HOT HEMOSTASIS N/A 05/23/2019   Procedure: HOT HEMOSTASIS (ARGON PLASMA COAGULATION/BICAP);  Surgeon:  [name], MD;  Location: [place];  Service: Endoscopy;  Laterality: N/A;  . MANDIBLE SURGERY    . MAXIMUM ACCESS (MAS)POSTERIOR LUMBAR INTERBODY FUSION (PLIF) 2 LEVEL N/A 08/08/2015   Procedure: Lumbar Four-Five, Lumbar Five-Sacral One Maximum access posterior lumbar interbody fusion;  Surgeon: [name], MD;  Location: [place] [facility]UR[facility];  Service: Neurosurgery;  Laterality: N/A;  LUMBAR FOUR-FIVE ,LUMBAR FIVE -SACRAL Maximum access posterior lumbar interbody fusion  . POLYPECTOMY  05/20/2019   Procedure: POLYPECTOMY;  Surgeon: [name], Telford Nab., MD;  Location: [facility] END[facility]COPY;  Service: Gastroenterology;;  . RENAL ARTERY STENT  2010  . REPLACEMENT TOTAL KNEE  2006   RIGHT  . SPINAL CORD STIMULATOR INSERTION N/A 06/18/2017   Procedure: LUMBAR SPINAL CORD STIMULATOR INSERTION;  Surgeon: [name], MD;  Location: [place];  Service: Neurosurgery;  Laterality: N/A;  LUMBAR SPINAL CORD STIMULATOR INSERTION  . TOTAL HIP ARTHROPLASTY     right    Family History  Problem Relation Age of Onset  . Lung cancer Father 46       deceased   . Cancer Father 31       LUNG  . Stroke Mother 104       DECEASED   . Hyperlipidemia Mother   . Heart attack Brother 57       deceased  . Diabetes Brother   . Liver cancer Sister 16       deceased   .  Cancer Sister     No Known Allergies  Current Outpatient Medications on File Prior to Visit  Medication Sig Dispense Refill  . amiodarone (PACERONE) 200 MG tablet Take 1 tablet by mouth twice daily for 7 days, then take 1 tablet by mouth daily. 97 tablet 3  . cetirizine (ZYRTEC) 10 MG tablet TAKE 1 TABLET BY MOUTH ONCE DAILY 30 tablet 2  . docusate sodium (COLACE) 100 MG capsule Take 2 capsules (200 mg total) by mouth daily. 10 capsule 0  . furosemide (LASIX) 40 MG tablet Take 1 tablet (40 mg total) by mouth daily. 90 tablet 1  . guaiFENesin (MUCINEX) 600 MG 12 hr tablet Take 600 mg by mouth 2 (two) times daily as needed for cough or to loosen phlegm.    Marland Kitchen levothyroxine (SYNTHROID) 112 MCG tablet Take 112 mcg by mouth daily before breakfast.    . linaclotide (LINZESS) 145 MCG CAPS capsule Take 1 capsule (145 mcg total) by mouth daily before breakfast. For constipation. 90 capsule 0  . metoprolol succinate (TOPROL XL) 25 MG 24 hr tablet Take 1 tablet (25 mg total) by mouth daily. 90 tablet 3  . mometasone (NASONEX) 50 MCG/ACT nasal spray Place 2 sprays into the nose daily as needed (for allergies).    . Multiple Vitamin (MULTIVITAMIN WITH MINERALS) TABS tablet Take 1 tablet by mouth daily.    . Rivaroxaban (XARELTO) 15 MG TABS tablet Take 1 tablet (15 mg total) by mouth daily with supper. 30 tablet 2  . rosuvastatin (CRESTOR) 10 MG tablet TAKE ONE TABLET BY MOUTH IN THE EVENING for cholesterol. 90 tablet 0  . tamsulosin (FLOMAX) 0.4 MG CAPS capsule TAKE 1 CAPSULE BY MOUTH ONCE DAILY (Patient taking differently: Take 0.4 mg by mouth daily. ) 90 capsule 1  . triamcinolone cream (KENALOG) 0.1 % Apply 1 application topically 2 (two) times daily as needed (affected area(s) of ankle.).    Marland Kitchen vitamin B-12 (CYANOCOBALAMIN) 1000 MCG tablet Take 1 tablet (1,000 mcg total) by mouth daily. 90 tablet 1  No current facility-administered medications on file prior to visit.    BP 120/62   Pulse 72   Temp  (!) 96.9 F (36.1 C) (Temporal)   Ht 6' (1.829 m)   Wt 225 lb 12 oz (102.4 kg)   SpO2 95%   BMI 30.62 kg/m    Objective:   Physical Exam  Constitutional: He appears well-nourished.  Cardiovascular: Normal rate and regular rhythm.  Respiratory: Effort normal and breath sounds normal.  Musculoskeletal:     Cervical back: Neck supple.  Skin: Skin is warm and dry. No pallor.  Psychiatric: He has a normal mood and affect.           Assessment & Plan:

## 2019-07-28 NOTE — Assessment & Plan Note (Signed)
Appears much better today. Following with hematology, undergoing iron infusions.

## 2019-07-28 NOTE — Patient Instructions (Signed)
Stop by the lab prior to leaving today. I will notify you of your results once received.   Be sure to take your levothyroxine (thyroid medication) every morning on an empty stomach with water only. No food or other medications for 30 minutes. No heartburn medication, IRON pills, calcium, vitamin D, or magnesium pills within four hours of taking levothyroxine.   STOP taking Linzess for constipation. Take only prune juice.  It was a pleasure to see you today!

## 2019-07-28 NOTE — Assessment & Plan Note (Signed)
Compliant to [facility], repeat lipid panel pending.  °

## 2019-08-01 ENCOUNTER — Other Ambulatory Visit: Payer: Self-pay | Admitting: Primary Care

## 2019-08-01 DIAGNOSIS — E038 Other specified hypothyroidism: Secondary | ICD-10-CM

## 2019-08-01 MED ORDER — LEVOTHYROXINE SODIUM 100 MCG PO TABS: ORAL_TABLET | ORAL | 0 refills | Status: DC

## 2019-08-03 ENCOUNTER — Telehealth: Payer: Self-pay | Admitting: Primary Care

## 2019-08-03 NOTE — Telephone Encounter (Signed)
Noted  

## 2019-08-03 NOTE — Telephone Encounter (Signed)
Pt's son called in and scheduled lab appt for 09/25/19.

## 2019-08-23 ENCOUNTER — Other Ambulatory Visit: Payer: Self-pay | Admitting: Primary Care

## 2019-08-23 DIAGNOSIS — E038 Other specified hypothyroidism: Secondary | ICD-10-CM

## 2019-08-31 ENCOUNTER — Telehealth: Payer: Self-pay | Admitting: Primary Care

## 2019-08-31 DIAGNOSIS — I4891 Unspecified atrial fibrillation: Secondary | ICD-10-CM

## 2019-08-31 NOTE — Telephone Encounter (Signed)
Forwarding to provider. This type request need to go straight to provider for authorization.

## 2019-08-31 NOTE — Telephone Encounter (Signed)
Leonard Berg with [facility] called and left a voicemail regarding a new medication refill request.  Pt is needing his Xarelto 20mg  refilled - never refilled by our office, pt is is requesting that Anda Kraft take over maintenance of this medication. Leonard Berg wanted to check with Anda Kraft prior to sending this refill request over.   Please advise, thanks.

## 2019-09-01 MED ORDER — RIVAROXABAN 15 MG PO TABS: 15.0000 mg | ORAL_TABLET | Freq: Every day | ORAL | 3 refills | Status: DC

## 2019-09-01 NOTE — Telephone Encounter (Signed)
Refills sent to pharmacy. 

## 2019-09-13 ENCOUNTER — Other Ambulatory Visit: Payer: Self-pay | Admitting: Primary Care

## 2019-09-13 DIAGNOSIS — E038 Other specified hypothyroidism: Secondary | ICD-10-CM

## 2019-09-14 ENCOUNTER — Other Ambulatory Visit: Payer: Self-pay | Admitting: Primary Care

## 2019-09-21 DIAGNOSIS — T162XXA Foreign body in left ear, initial encounter: Secondary | ICD-10-CM | POA: Diagnosis not present

## 2019-09-21 DIAGNOSIS — H903 Sensorineural hearing loss, bilateral: Secondary | ICD-10-CM | POA: Diagnosis not present

## 2019-09-23 ENCOUNTER — Other Ambulatory Visit: Payer: Self-pay | Admitting: Primary Care

## 2019-09-25 ENCOUNTER — Other Ambulatory Visit (INDEPENDENT_AMBULATORY_CARE_PROVIDER_SITE_OTHER): Payer: Medicare Other

## 2019-09-25 ENCOUNTER — Other Ambulatory Visit: Payer: Self-pay

## 2019-09-25 DIAGNOSIS — E038 Other specified hypothyroidism: Secondary | ICD-10-CM

## 2019-09-25 LAB — TSH: TSH: 0.18 u[IU]/mL — ABNORMAL LOW (ref 0.35–4.50)

## 2019-09-27 ENCOUNTER — Other Ambulatory Visit: Payer: Self-pay | Admitting: Primary Care

## 2019-09-27 ENCOUNTER — Inpatient Hospital Stay: Payer: Medicare Other | Attending: Oncology

## 2019-09-27 ENCOUNTER — Other Ambulatory Visit: Payer: Self-pay

## 2019-09-27 DIAGNOSIS — E785 Hyperlipidemia, unspecified: Secondary | ICD-10-CM

## 2019-09-27 DIAGNOSIS — D509 Iron deficiency anemia, unspecified: Secondary | ICD-10-CM | POA: Insufficient documentation

## 2019-09-27 DIAGNOSIS — D631 Anemia in chronic kidney disease: Secondary | ICD-10-CM | POA: Insufficient documentation

## 2019-09-27 DIAGNOSIS — E538 Deficiency of other specified B group vitamins: Secondary | ICD-10-CM | POA: Diagnosis not present

## 2019-09-27 DIAGNOSIS — Z87891 Personal history of nicotine dependence: Secondary | ICD-10-CM | POA: Diagnosis not present

## 2019-09-27 DIAGNOSIS — D500 Iron deficiency anemia secondary to blood loss (chronic): Secondary | ICD-10-CM

## 2019-09-27 DIAGNOSIS — R5383 Other fatigue: Secondary | ICD-10-CM | POA: Insufficient documentation

## 2019-09-27 DIAGNOSIS — N1832 Chronic kidney disease, stage 3b: Secondary | ICD-10-CM

## 2019-09-27 LAB — CBC WITH DIFFERENTIAL/PLATELET
Abs Immature Granulocytes: 0.01 10*3/uL (ref 0.00–0.07)
Basophils Absolute: 0.1 10*3/uL (ref 0.0–0.1)
Basophils Relative: 1 %
Eosinophils Absolute: 0.1 10*3/uL (ref 0.0–0.5)
Eosinophils Relative: 2 %
HCT: 44.7 % (ref 39.0–52.0)
Hemoglobin: 14.8 g/dL (ref 13.0–17.0)
Immature Granulocytes: 0 %
Lymphocytes Relative: 31 %
Lymphs Abs: 1.8 10*3/uL (ref 0.7–4.0)
MCH: 30.2 pg (ref 26.0–34.0)
MCHC: 33.1 g/dL (ref 30.0–36.0)
MCV: 91.2 fL (ref 80.0–100.0)
Monocytes Absolute: 0.7 10*3/uL (ref 0.1–1.0)
Monocytes Relative: 12 %
Neutro Abs: 3.2 10*3/uL (ref 1.7–7.7)
Neutrophils Relative %: 54 %
Platelets: 163 10*3/uL (ref 150–400)
RBC: 4.9 MIL/uL (ref 4.22–5.81)
RDW: 14.7 % (ref 11.5–15.5)
WBC: 5.8 10*3/uL (ref 4.0–10.5)
nRBC: 0 % (ref 0.0–0.2)

## 2019-09-27 LAB — RETIC PANEL
Immature Retic Fract: 7.7 % (ref 2.3–15.9)
RBC.: 4.88 MIL/uL (ref 4.22–5.81)
Retic Count, Absolute: 55.6 10*3/uL (ref 19.0–186.0)
Retic Ct Pct: 1.1 % (ref 0.4–3.1)
Reticulocyte Hemoglobin: 35.3 pg (ref >27.9)

## 2019-09-27 LAB — VITAMIN B12: Vitamin B-12: 736 pg/mL (ref 180–914)

## 2019-09-27 LAB — IRON AND TIBC
Iron: 97 ug/dL (ref 45–182)
Saturation Ratios: 32 % (ref 17.9–39.5)
TIBC: 308 ug/dL (ref 250–450)
UIBC: 211 ug/dL

## 2019-09-27 LAB — FERRITIN: Ferritin: 86 ng/mL (ref 24–336)

## 2019-09-29 ENCOUNTER — Inpatient Hospital Stay: Payer: Medicare Other

## 2019-09-29 ENCOUNTER — Ambulatory Visit: Payer: Medicare Other

## 2019-09-29 ENCOUNTER — Inpatient Hospital Stay (HOSPITAL_BASED_OUTPATIENT_CLINIC_OR_DEPARTMENT_OTHER): Payer: Medicare Other | Admitting: Oncology

## 2019-09-29 ENCOUNTER — Other Ambulatory Visit: Payer: Self-pay

## 2019-09-29 ENCOUNTER — Encounter: Payer: Self-pay | Admitting: Oncology

## 2019-09-29 VITALS — BP 117/52 | HR 59 | Temp 98.6°F | Resp 18 | Wt 221.8 lb

## 2019-09-29 DIAGNOSIS — D509 Iron deficiency anemia, unspecified: Secondary | ICD-10-CM | POA: Diagnosis not present

## 2019-09-29 DIAGNOSIS — E538 Deficiency of other specified B group vitamins: Secondary | ICD-10-CM | POA: Diagnosis not present

## 2019-09-29 DIAGNOSIS — D631 Anemia in chronic kidney disease: Secondary | ICD-10-CM

## 2019-09-29 DIAGNOSIS — N1832 Chronic kidney disease, stage 3b: Secondary | ICD-10-CM | POA: Diagnosis not present

## 2019-09-29 DIAGNOSIS — D500 Iron deficiency anemia secondary to blood loss (chronic): Secondary | ICD-10-CM | POA: Diagnosis not present

## 2019-09-29 DIAGNOSIS — Z87891 Personal history of nicotine dependence: Secondary | ICD-10-CM | POA: Diagnosis not present

## 2019-09-29 DIAGNOSIS — R5383 Other fatigue: Secondary | ICD-10-CM | POA: Diagnosis not present

## 2019-09-29 NOTE — Progress Notes (Signed)
Patient here for follow up. No new concerns voiced.  °

## 2019-09-29 NOTE — Progress Notes (Signed)
Hematology/Oncology Consult note [facility] Telephone:([phone] [phone]   Patient Care Team: [name], NP as PCP - General (Internal Medicine) [name], MD as PCP - Cardiology (Cardiology) Earlie Server, MD as Consulting Physician (Hematology and Oncology)  REFERRING PROVIDER: [name], NP CHIEF COMPLAINTS/REASON FOR VISIT:  Evaluation of iron deficiency anemia  HISTORY OF PRESENTING ILLNESS:  Leonard [name] is a  86 y.o.  male with PMH listed below was seen in consultation at the request of [name], NP   for evaluation of iron deficiency anemia.   Reviewed patient's recent labs  06/16/2019 labs revealed anemia with hemoglobin of 9.7.   Reviewed patient's previous labs ordered by primary care physician's office, anemia is acute onset , duration is since 05/18/2019.  Patient was recently admitted from 05/18/2019-05/25/2019 Patient developed acute blood loss anemia secondary to small intestinal AV malformations.  At that time patient was on chronic Xarelto.  Patient received multiple units of PRBC transfusions during his admission.  He underwent EGD, colonoscopy which showed no bleeding lesions or source of bleeding.  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  Patient underwent repeat EGD enteroscopy with cauterization of AVMs. Patient has paroxysmal atrial fibrillation, EF 35%.  History of recurrent pulmonary embolism, Xarelto was initially held due to the bleeding, Xarelto was resumed.  He reports no bloody or black stool recently  Associated signs and symptoms: Patient reports mild fatigue.  Denies SOB with exertion.  Denies weight loss, easy bruising, hematochezia, hemoptysis, hematuria.  INTERVAL HISTORY Leonard SANJUAN Sr. is a 86 y.o. male who has above history reviewed by me today presents for follow up visit for management of iron deficiency anemia Problems and complaints are listed  below: Patient reports fatigue has improved since IV iron treatments.  Patient was accompanied by wife today.  No new complaints.  Review of Systems  Constitutional: Negative for appetite change, chills, fatigue, fever and unexpected weight change.  HENT:   Negative for hearing loss and voice change.   Eyes: Negative for eye problems and icterus.  Respiratory: Negative for chest tightness, cough and shortness of breath.   Cardiovascular: Negative for chest pain and leg swelling.  Gastrointestinal: Negative for abdominal distention and abdominal pain.  Endocrine: Negative for hot flashes.  Genitourinary: Negative for difficulty urinating, dysuria and frequency.   Musculoskeletal: Negative for arthralgias.  Skin: Negative for itching and rash.  Neurological: Negative for light-headedness and numbness.  Hematological: Negative for adenopathy. Does not bruise/bleed easily.  Psychiatric/Behavioral: Negative for confusion.    MEDICAL HISTORY:  Past Medical History:  Diagnosis Date  . Anemia   . Anxiety   . Arrhythmia    PAROXYSMAL ATRIAL FIBRILLATION  . Arthritis    OSTEOARTHRITIS  . Cataracts, bilateral   . Chronic back pain   . Coronary artery disease 8/11   CABG...LM EMERGENT WITH IABP sees Dr. Legrand Como cooper  . DJD (degenerative joint disease)   . DVT (deep venous thrombosis) ([place])   . Dyspnea   . GERD (gastroesophageal reflux disease)   . History of BPH   . Hyperlipidemia   . Hypertension   . Hypothyroidism   . Iron deficiency anemia due to chronic blood loss 06/29/2019  . Lumbar post-laminectomy syndrome   . Myocardial infarction ([place])   . Pulmonary embolism ([place]) 2009   hx of  . RBBB (right bundle branch block)   . Renal artery stenosis ([place])   . Thrombocytopenia ([place])   .  Thyroid disease    HYPOTHYROIDISM  . Type 2 diabetes mellitus without complication, without long-term current use of insulin ([place]) 07/28/2019  . Unstable angina ([place])    NONE IN LONG TIME     SURGICAL HISTORY: Past Surgical History:  Procedure Laterality Date  . Blood clot  Feb.20,  2016   Pulmonary Embolism  . CARDIOVERSION N/A 03/15/2019   Procedure: CARDIOVERSION;  Surgeon: Pixie Casino, MD;  Location: [facility] ENDOSCOPY;  Service: Cardiovascular;  Laterality: N/A;  . CAROTID ENDARTERECTOMY Right Oct. 17, 2013   CE  . CAROTID ENDARTERECTOMY Left Dec. 3, 2016   CE  . CATARACT EXTRACTION  05/2014,06/2014  . COLONOSCOPY WITH PROPOFOL N/A 05/20/2019   Procedure: COLONOSCOPY WITH PROPOFOL;  Surgeon: Rush Landmark Telford Nab., MD;  Location: [place];  Service: Gastroenterology;  Laterality: N/A;  . CORONARY ARTERY BYPASS GRAFT  11-21-09   EMERGENT X 3 GRAFTING. ..[name], MD, cc: [name]  . ENDARTERECTOMY  01/28/2012   Procedure: ENDARTERECTOMY CAROTID;  Surgeon: [name], MD;  Location: [place];  Service: Vascular;  Laterality: Right;  with Primary Closure of Artery  . ENDARTERECTOMY  03/15/2012   Procedure: ENDARTERECTOMY CAROTID;  Surgeon: [name], MD;  Location: [place];  Service: Vascular;  Laterality: Left;  . ENTEROSCOPY N/A 05/23/2019   Procedure: ENTEROSCOPY;  Surgeon: [name], MD;  Location: [place];  Service: Endoscopy;  Laterality: N/A;  . ESOPHAGOGASTRODUODENOSCOPY N/A 05/20/2019   Procedure: ESOPHAGOGASTRODUODENOSCOPY (EGD);  Surgeon: [name]., MD;  Location: [place];  Service: Gastroenterology;  Laterality: N/A;  . ESOPHAGOGASTRODUODENOSCOPY (EGD) WITH PROPOFOL N/A 05/19/2019   Procedure: ESOPHAGOGASTRODUODENOSCOPY (EGD) WITH PROPOFOL;  Surgeon: [name], MD;  Location: [place];  Service: Endoscopy;  Laterality: N/A;  . FOOT SURGERY    . GIVENS CAPSULE STUDY N/A 05/20/2019   Procedure: GIVENS CAPSULE STUDY;  Surgeon: [name]., MD;  Location: [place];  Service: Gastroenterology;  Laterality: N/A;  . HOT HEMOSTASIS N/A 05/23/2019   Procedure: HOT HEMOSTASIS (ARGON PLASMA  COAGULATION/BICAP);  Surgeon: [name], MD;  Location: [place];  Service: Endoscopy;  Laterality: N/A;  . MANDIBLE SURGERY    . MAXIMUM ACCESS (MAS)POSTERIOR LUMBAR INTERBODY FUSION (PLIF) 2 LEVEL N/A 08/08/2015   Procedure: Lumbar Four-Five, Lumbar Five-Sacral One Maximum access posterior lumbar interbody fusion;  Surgeon: [name], MD;  Location: [place] NEURO ORS;  Service: Neurosurgery;  Laterality: N/A;  LUMBAR FOUR-FIVE ,LUMBAR FIVE -SACRAL Maximum access posterior lumbar interbody fusion  . POLYPECTOMY  05/20/2019   Procedure: POLYPECTOMY;  Surgeon: [name], Telford Nab., MD;  Location: [facility] ENDOSCOPY;  Service: Gastroenterology;;  . RENAL ARTERY STENT  2010  . REPLACEMENT TOTAL KNEE  2006   RIGHT  . SPINAL CORD STIMULATOR INSERTION N/A 06/18/2017   Procedure: LUMBAR SPINAL CORD STIMULATOR INSERTION;  Surgeon: [name], MD;  Location: [place];  Service: Neurosurgery;  Laterality: N/A;  LUMBAR SPINAL CORD STIMULATOR INSERTION  . TOTAL HIP ARTHROPLASTY     right    SOCIAL HISTORY: Social History   Socioeconomic History  . Marital status: Married    Spouse name: Not on file  . Number of children: 2  . Years of education: Not on file  . Highest education level: Not on file  Occupational History  . 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 Smoking status: Former Smoker    Types: Cigarettes    Quit date:  04/13/1974    Years since quitting: 45.4  . Smokeless tobacco: Former Systems developer    Types: Chew    Quit date: 01/26/2002  . Tobacco comment: He has smoked about 27-pack-year hx   Vaping Use  . Vaping Use: Never used  Substance and Sexual Activity  . Alcohol use: Yes    Alcohol/week: 2.0 standard drinks    Types: 2 Cans of beer per week    Comment: He used to drink more heavily , but rarely drinks at the current time  . Drug use: No  . Sexual activity: Not on file  Other Topics Concern  . Not on  file  Social History Narrative   LIVES IN [place] WITH HIS WIFE.   HE HAS 2 GROWN CHILDREN   HE IS RETIRED PRISON FIRM SUPERINTENDENT.   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   HE GOES TO THE [employer] 3 X WEEKLY.   Social Determinants of Health   Financial Resource Strain:   . Difficulty of Paying Living Expenses:   Food Insecurity:   . Worried About Charity fundraiser in the Last Year:   . Arboriculturist in the Last Year:   Transportation Needs:   . Film/video editor (Medical):   Marland Kitchen Lack of Transportation (Non-Medical):   Physical Activity:   . Days of Exercise per Week:   . Minutes of Exercise per Session:   Stress:   . Feeling of Stress :   Social Connections:   . Frequency of Communication with Friends and Family:   . Frequency of Social Gatherings with Friends and Family:   . Attends Religious Services:   . Active Member of Clubs or Organizations:   . Attends Archivist Meetings:   Marland Kitchen Marital Status:   Intimate Partner Violence:   . Fear of Current or Ex-Partner:   . Emotionally Abused:   Marland Kitchen Physically Abused:   . Sexually Abused:     FAMILY HISTORY: Family History  Problem Relation Age of Onset  . Lung cancer Father 79       deceased   . Cancer Father 13       LUNG  . Stroke Mother 43       DECEASED   . Hyperlipidemia Mother   . Heart attack Brother 55       deceased  . Diabetes Brother   . Liver cancer Sister 83       deceased   . Cancer Sister     ALLERGIES:  has No Known Allergies.  MEDICATIONS:  Current Outpatient Medications  Medication Sig Dispense Refill  . amiodarone (PACERONE) 200 MG tablet Take 1 tablet by mouth twice daily for 7 days, then take 1 tablet by mouth daily. 97 tablet 3  . cetirizine (ZYRTEC) 10 MG tablet TAKE 1 TABLET BY MOUTH ONCE DAILY 30 tablet 5  . docusate sodium (COLACE) 100 MG capsule Take 2 capsules (200 mg total) by mouth daily. 10 capsule 0  . furosemide (LASIX)  40 MG tablet Take 1 tablet (40 mg total) by mouth daily. 90 tablet 1  . guaiFENesin (MUCINEX) 600 MG 12 hr tablet Take 600 mg by mouth 2 (two) times daily as needed for cough or to loosen phlegm.    Marland Kitchen levothyroxine (SYNTHROID) 100 MCG tablet TAKE 1 TAB BY MOUTH ONCE DAILY. TAKE ON AN EMPTY STOMACH WITH A GLASS OF WATER ATLEAST 30-60 MINUTES BEFORE BREAKFAST 90  tablet 0  . LINZESS 145 MCG CAPS capsule Take 145 mcg by mouth daily.    . metoprolol succinate (TOPROL XL) 25 MG 24 hr tablet Take 1 tablet (25 mg total) by mouth daily. 90 tablet 3  . mometasone (NASONEX) 50 MCG/ACT nasal spray Place 2 sprays into the nose daily as needed (for allergies).    . Multiple Vitamin (MULTIVITAMIN WITH MINERALS) TABS tablet Take 1 tablet by mouth daily.    . Rivaroxaban (XARELTO) 15 MG TABS tablet Take 1 tablet (15 mg total) by mouth daily with supper. 90 tablet 3  . rosuvastatin (CRESTOR) 10 MG tablet TAKE ONE TABLET BY MOUTH IN THE EVENING FOR CHOLESTEROL. 90 tablet 1  . tamsulosin (FLOMAX) 0.4 MG CAPS capsule TAKE 1 CAPSULE BY MOUTH ONCE DAILY 90 capsule 1  . triamcinolone cream (KENALOG) 0.1 % Apply 1 application topically 2 (two) times daily as needed (affected area(s) of ankle.).    Marland Kitchen vitamin B-12 (CYANOCOBALAMIN) 1000 MCG tablet Take 1 tablet (1,000 mcg total) by mouth daily. 90 tablet 1   No current facility-administered medications for this visit.     PHYSICAL EXAMINATION: ECOG PERFORMANCE STATUS: 1 - Symptomatic but completely ambulatory Vitals:   09/29/19 1320  BP: (!) 117/52  Pulse: (!) 59  Resp: 18  Temp: 98.6 F (37 C)   Filed Weights   09/29/19 1320  Weight: 221 lb 12.8 oz (100.6 kg)    Physical Exam Constitutional:      General: He is not in acute distress. HENT:     Head: Normocephalic and atraumatic.  Eyes:     General: No scleral icterus. Cardiovascular:     Rate and Rhythm: Normal rate and regular rhythm.     Heart sounds: Normal heart sounds.  Pulmonary:     Effort:  Pulmonary effort is normal. No respiratory distress.     Breath sounds: No wheezing.  Abdominal:     General: Bowel sounds are normal. There is no distension.     Palpations: Abdomen is soft.  Musculoskeletal:        General: No deformity. Normal range of motion.     Cervical back: Normal range of motion and neck supple.  Skin:    General: Skin is warm and dry.     Findings: No erythema or rash.  Neurological:     Mental Status: He is alert. Mental status is at baseline.     Cranial Nerves: No cranial nerve deficit.     Coordination: Coordination normal.  Psychiatric:        Mood and Affect: Mood normal.       CMP Latest Ref Rng & Units 05/25/2019  Glucose 70 - 99 mg/dL 104(H)  BUN 8 - 23 mg/dL 12  Creatinine 0.61 - 1.24 mg/dL 1.51(H)  Sodium 135 - 145 mmol/L 141  Potassium 3.5 - 5.1 mmol/L 3.6  Chloride 98 - 111 mmol/L 107  CO2 22 - 32 mmol/L 24  Calcium 8.9 - 10.3 mg/dL 8.1(L)  Total Protein 6.5 - 8.1 g/dL -  Total Bilirubin 0.3 - 1.2 mg/dL -  Alkaline Phos 38 - 126 U/L -  AST 15 - 41 U/L -  ALT 0 - 44 U/L -   CBC Latest Ref Rng & Units 09/27/2019  WBC 4.0 - 10.5 K/uL 5.8  Hemoglobin 13.0 - 17.0 g/dL 14.8  Hematocrit 39 - 52 % 44.7  Platelets 150 - 400 K/uL 163     LABORATORY DATA:  I have reviewed the data  as listed Lab Results  Component Value Date   WBC 5.8 09/27/2019   HGB 14.8 09/27/2019   HCT 44.7 09/27/2019   MCV 91.2 09/27/2019   PLT 163 09/27/2019   Recent Labs    05/19/19 0002 05/19/19 0002 05/20/19 0450 05/21/19 0556 05/25/19 0244  NA 138   < > 141 139 141  K 3.7   < > 4.0 4.1 3.6  CL 104   < > 108 105 107  CO2 25   < > 25 25 24   GLUCOSE 98   < > 104* 108* 104*  BUN 35*   < > 24* 18 12  CREATININE 1.58*   < > 1.60* 1.63* 1.51*  CALCIUM 8.6*   < > 8.4* 8.5* 8.1*  GFRNONAA 39*   < > 38* 37* 41*  GFRAA 45*   < > 44* 43* 47*  PROT 5.1*  --  4.9* 5.0*  --   ALBUMIN 2.9*  --  2.7* 2.8*  --   AST 20  --  22 27  --   ALT 16  --  15 17  --    ALKPHOS 45  --  42 52  --   BILITOT 1.2  --  1.0 0.7  --    < > = values in this interval not displayed.   Iron/TIBC/Ferritin/ %Sat    Component Value Date/Time   IRON 97 09/27/2019 1146   TIBC 308 09/27/2019 1146   FERRITIN 86 09/27/2019 1146   IRONPCTSAT 32 09/27/2019 1146     RADIOGRAPHIC STUDIES: I have personally reviewed the radiological images as listed and agreed with the findings in the report. No results found.     ASSESSMENT & PLAN:  1. B12 deficiency   2. Iron deficiency anemia due to chronic blood loss   3. Anemia due to stage 3b chronic kidney disease   #Iron deficiency anemia, Labs are reviewed and discussed with patient. CBC showed improved hemoglobin to 14.8. Iron panel also showed improved iron level.  Ferritin 86, iron saturation 32. No need for additional IV iron at this point. Chronic kidney disease, avoid nephrotoxin.  Hemoglobin is able to completely normalized after iron stores replete.  Monitor. Vitamin B12 deficiency, continue vitamin B12 1000 MCG daily.  B12 has improved.  Refill sent to pharmacy.  Orders Placed This Encounter  Procedures  . CBC with Differential/Platelet    Standing Status:   Future    Standing Expiration Date:   09/28/2020  . Ferritin    Standing Status:   Future    Standing Expiration Date:   09/28/2020  . Iron and TIBC    Standing Status:   Future    Standing Expiration Date:   09/28/2020  . Comprehensive metabolic panel    Standing Status:   Future    Standing Expiration Date:   09/28/2020  . Vitamin B12    Standing Status:   Future    Standing Expiration Date:   09/28/2020    All questions were answered. The patient knows to call the clinic with any problems questions or concerns. Return of visit:  12 months.   Earlie Server, MD, PhD Hematology Oncology [place] at [facility] 09/29/2019

## 2019-10-11 ENCOUNTER — Ambulatory Visit (INDEPENDENT_AMBULATORY_CARE_PROVIDER_SITE_OTHER): Payer: Medicare Other

## 2019-10-11 DIAGNOSIS — Z0000 Encounter for general adult medical examination without abnormal findings: Secondary | ICD-10-CM

## 2019-10-11 NOTE — Progress Notes (Signed)
Subjective:   Leonard [name] is a 86 y.o. male who presents for Medicare Annual/Subsequent preventive examination.  Review of Systems: N/A      I connected with the patient today by telephone and verified that I am speaking with the correct person using two identifiers. Location patient: home Location nurse: work Persons participating in the virtual visit: patient, Marine scientist.   I discussed the limitations, risks, security and privacy concerns of performing an evaluation and management service by telephone and the availability of in person appointments. I also discussed with the patient that there may be a patient responsible charge related to this service. The patient expressed understanding and verbally consented to this telephonic visit.    Interactive audio and video telecommunications were attempted between this nurse and patient, however failed, due to patient having technical difficulties OR patient did not have access to video capability.  We continued and completed visit with audio only.     Cardiac Risk Factors include: advanced age (>60men, >24 women);diabetes mellitus;hypertension;male gender;dyslipidemia     Objective:    Today's Vitals   10/11/19 1157  PainSc: 0-No pain   There is no height or weight on file to calculate BMI.  Advanced Directives 10/11/2019 09/29/2019 06/28/2019 05/19/2019 05/18/2019 05/18/2019 03/15/2019  Does Patient Have a Medical Advance Directive? Yes Yes Yes No No No Yes  Type of Paramedic of [place];Living will Living will;Healthcare Power of [place];Living will - - - Living will  Does patient want to make changes to medical advance directive? - - - - - - -  Copy of [place] in Chart? No - copy requested - - - - - -  Would patient like information on creating a medical advance directive? - - - No - Patient declined No - Patient declined No - Patient declined -  Pre-existing  out of facility DNR order (yellow form or pink MOST form) - - - - - - -    Current Medications (verified) Outpatient Encounter Medications as of 10/11/2019  Medication Sig  . amiodarone (PACERONE) 200 MG tablet Take 1 tablet by mouth twice daily for 7 days, then take 1 tablet by mouth daily.  . cetirizine (ZYRTEC) 10 MG tablet TAKE 1 TABLET BY MOUTH ONCE DAILY  . docusate sodium (COLACE) 100 MG capsule Take 2 capsules (200 mg total) by mouth daily.  . furosemide (LASIX) 40 MG tablet Take 1 tablet (40 mg total) by mouth daily. (Patient taking differently: Take 40 mg by mouth 2 (two) times daily. )  . guaiFENesin (MUCINEX) 600 MG 12 hr tablet Take 600 mg by mouth 2 (two) times daily as needed for cough or to loosen phlegm.  Marland Kitchen levothyroxine (SYNTHROID) 100 MCG tablet TAKE 1 TAB BY MOUTH ONCE DAILY. TAKE ON AN EMPTY STOMACH WITH A GLASS OF WATER ATLEAST 30-60 MINUTES BEFORE BREAKFAST  . LINZESS 145 MCG CAPS capsule Take 145 mcg by mouth daily.  . metoprolol succinate (TOPROL XL) 25 MG 24 hr tablet Take 1 tablet (25 mg total) by mouth daily.  . mometasone (NASONEX) 50 MCG/ACT nasal spray Place 2 sprays into the nose daily as needed (for allergies).  . Multiple Vitamin (MULTIVITAMIN WITH MINERALS) TABS tablet Take 1 tablet by mouth daily.  . Rivaroxaban (XARELTO) 15 MG TABS tablet Take 1 tablet (15 mg total) by mouth daily with supper.  . rosuvastatin (CRESTOR) 10 MG tablet TAKE ONE TABLET BY MOUTH IN THE EVENING FOR CHOLESTEROL.  Marland Kitchen  tamsulosin (FLOMAX) 0.4 MG CAPS capsule TAKE 1 CAPSULE BY MOUTH ONCE DAILY  . triamcinolone cream (KENALOG) 0.1 % Apply 1 application topically 2 (two) times daily as needed (affected area(s) of ankle.).  Marland Kitchen vitamin B-12 (CYANOCOBALAMIN) 1000 MCG tablet Take 1 tablet (1,000 mcg total) by mouth daily.   No facility-administered encounter medications on file as of 10/11/2019.    Allergies (verified) Patient has no known allergies.   History: Past Medical History:    Diagnosis Date  . Anemia   . Anxiety   . Arrhythmia    PAROXYSMAL ATRIAL FIBRILLATION  . Arthritis    OSTEOARTHRITIS  . Cataracts, bilateral   . Chronic back pain   . Coronary artery disease 8/11   CABG...LM EMERGENT WITH IABP sees Dr. Legrand Como cooper  . DJD (degenerative joint disease)   . DVT (deep venous thrombosis) ([place])   . Dyspnea   . GERD (gastroesophageal reflux disease)   . History of BPH   . Hyperlipidemia   . Hypertension   . Hypothyroidism   . Iron deficiency anemia due to chronic blood loss 06/29/2019  . Lumbar post-laminectomy syndrome   . Myocardial infarction ([place])   . Pulmonary embolism ([place]) 2009   hx of  . RBBB (right bundle branch block)   . Renal artery stenosis ([place])   . Thrombocytopenia ([place])   . Thyroid disease    HYPOTHYROIDISM  . Type 2 diabetes mellitus without complication, without long-term current use of insulin ([place]) 07/28/2019  . Unstable angina ([place])    NONE IN LONG TIME   Past Surgical History:  Procedure Laterality Date  . Blood clot  Feb.20,  2016   Pulmonary Embolism  . CARDIOVERSION N/A 03/15/2019   Procedure: CARDIOVERSION;  Surgeon: Pixie Casino, MD;  [facility];  Service: Cardiovascular;  Laterality: N/A;  . CAROTID ENDARTERECTOMY Right Oct. 17, 2013   CE  . CAROTID ENDARTERECTOMY Left Dec. 3, 2016   CE  . CATARACT EXTRACTION  05/2014,06/2014  . COLONOSCOPY WITH PROPOFOL N/A 05/20/2019   Procedure: COLONOSCOPY WITH PROPOFOL;  Surgeon: Rush Landmark Telford Nab., MD;  Location: [place];  Service: Gastroenterology;  Laterality: N/A;  . CORONARY ARTERY BYPASS GRAFT  11-21-09   EMERGENT X 3 GRAFTING. ..[name], MD, cc: [name]  . ENDARTERECTOMY  01/28/2012   Procedure: ENDARTERECTOMY CAROTID;  Surgeon: [name], MD;  Location: [place];  Service: Vascular;  Laterality: Right;  with Primary Closure of Artery  . ENDARTERECTOMY  03/15/2012   Procedure: ENDARTERECTOMY CAROTID;  Surgeon:  [name], MD;  Location: [place];  Service: Vascular;  Laterality: Left;  . ENTEROSCOPY N/A 05/23/2019   Procedure: ENTEROSCOPY;  Surgeon: [name], MD;  Location: [place];  Service: Endoscopy;  Laterality: N/A;  . ESOPHAGOGASTRODUODENOSCOPY N/A 05/20/2019   Procedure: ESOPHAGOGASTRODUODENOSCOPY (EGD);  Surgeon: [name]., MD;  Location: [place];  Service: Gastroenterology;  Laterality: N/A;  . ESOPHAGOGASTRODUODENOSCOPY (EGD) WITH PROPOFOL N/A 05/19/2019   Procedure: ESOPHAGOGASTRODUODENOSCOPY (EGD) WITH PROPOFOL;  Surgeon: [name], MD;  Location: [place];  Service: Endoscopy;  Laterality: N/A;  . FOOT SURGERY    . GIVENS CAPSULE STUDY N/A 05/20/2019   Procedure: GIVENS CAPSULE STUDY;  Surgeon: [name]., MD;  Location: [place];  Service: Gastroenterology;  Laterality: N/A;  . HOT HEMOSTASIS N/A 05/23/2019   Procedure: HOT HEMOSTASIS (ARGON PLASMA COAGULATION/BICAP);  Surgeon: [name], MD;  Location: [place];  Service: Endoscopy;  Laterality: N/A;  . MANDIBLE SURGERY    .  MAXIMUM ACCESS (MAS)POSTERIOR LUMBAR INTERBODY FUSION (PLIF) 2 LEVEL N/A 08/08/2015   Procedure: Lumbar Four-Five, Lumbar Five-Sacral One Maximum access posterior lumbar interbody fusion;  Surgeon: [name], MD;  Location: [place] NEURO ORS;  Service: Neurosurgery;  Laterality: N/A;  LUMBAR FOUR-FIVE ,LUMBAR FIVE -SACRAL Maximum access posterior lumbar interbody fusion  . POLYPECTOMY  05/20/2019   Procedure: POLYPECTOMY;  Surgeon: [name], Telford Nab., MD;  Location[facility] END[facility]COPY;  Service: Gastroenterology;;  . RENAL ARTERY STENT  2010  . REPLACEMENT TOTAL KNEE  2006   RIGHT  . SPINAL CORD STIMULATOR INSERTION N/A 06/18/2017   Procedure: LUMBAR SPINAL CORD STIMULATOR INSERTION;  Surgeon: [name], MD;  Location: [place];  Service: Neurosurgery;  Laterality: N/A;  LUMBAR SPINAL CORD STIMULATOR INSERTION  . TOTAL HIP ARTHROPLASTY     right   Family  History  Problem Relation Age of Onset  . Lung cancer Father 91       deceased   . Cancer Father 23       LUNG  . Stroke Mother 27       DECEASED   . Hyperlipidemia Mother   . Heart attack Brother 41       deceased  . Diabetes Brother   . Liver cancer Sister 51       deceased   . Cancer Sister    Social History   Socioeconomic History  . Marital status: Married    Spouse name: Not on file  . Number of children: 2  . Years of education: Not on file  . Highest education level: Not on file  Occupational History  . 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 Smoking status: Former Smoker    Types: Cigarettes    Quit date: 04/13/1974    Years since quitting: 45.5  . Smokeless tobacco: Former Systems developer    Types: Chew    Quit date: 01/26/2002  . Tobacco comment: He has smoked about 27-pack-year hx   Vaping Use  . Vaping Use: Never used  Substance and Sexual Activity  . Alcohol use: Not Currently    Alcohol/week: 2.0 standard drinks    Types: 2 Cans of beer per week    Comment: He used to drink more heavily , but rarely drinks at the current time  . Drug use: No  . Sexual activity: Not on file  Other Topics Concern  . Not on file  Social History Narrative   LIVES IN [place] WITH HIS WIFE.   HE HAS 2 GROWN CHILDREN   HE IS RETIRED PRISON FIRM SUPERINTENDENT.   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   HE GOES TO THE [employer] 3 X WEEKLY.   Social Determinants of Health   Financial Resource Strain: Low Risk   . Difficulty of Paying Living Expenses: Not hard at all  Food Insecurity: No Food Insecurity  . Worried About Charity fundraiser in the Last Year: Never true  . Ran Out of Food in the Last Year: Never true  Transportation Needs: No Transportation Needs  . Lack of Transportation (Medical): No  . Lack of Transportation (Non-Medical): No  Physical Activity:  Inactive  . Days of Exercise per Week: 0 days  . Minutes of Exercise per Session: 0 min  Stress: No Stress Concern Present  . Feeling of Stress : Not at all  Social  Connections:   . Frequency of Communication with Friends and Family:   . Frequency of Social Gatherings with Friends and Family:   . Attends Religious Services:   . Active Member of Clubs or Organizations:   . Attends Archivist Meetings:   Marland Kitchen Marital Status:     Tobacco Counseling Counseling given: Not Answered Comment: He has smoked about 27-pack-year hx    Clinical Intake:  Pre-visit preparation completed: Yes  Pain : No/denies pain Pain Score: 0-No pain     Nutritional Risks: None Diabetes: Yes CBG done?: No Did pt. bring in CBG monitor from home?: No  How often do you need to have someone help you when you read instructions, pamphlets, or other written materials from your doctor or pharmacy?: 1 - Never What is the last grade level you completed in school?: 12th  Diabetic: No Nutrition Risk Assessment:  Has the patient had any N/V/D within the last 2 months?  No  Does the patient have any non-healing wounds?  No  Has the patient had any unintentional weight loss or weight gain?  No   Diabetes:  Is the patient diabetic?  Yes  If diabetic, was a CBG obtained today?  No  Did the patient bring in their glucometer from home?  No  How often do you monitor your CBG's? Does not check his blood sugars at home.   Financial Strains and Diabetes Management:  Are you having any financial strains with the device, your supplies or your medication? No .  Does the patient want to be seen by Chronic Care Management for management of their diabetes?  No  Would the patient like to be referred to a Nutritionist or for Diabetic Management?  No   Diabetic Exams:  Diabetic Eye Exam: Overdue for diabetic eye exam. Pt has been advised about the importance in completing this exam. Patient advised to call and  schedule an eye exam. Diabetic Foot Exam: Overdue, Pt has been advised about the importance in completing this exam. Pt is scheduled for diabetic foot exam on 11/28/2019.   Interpreter Needed?: No  Information entered by :: CJohnson, LPN   Activities of Daily Living In your present state of health, do you have any difficulty performing the following activities: 10/11/2019 05/18/2019  Hearing? Y N  Comment wears hearing aids -  Vision? N N  Difficulty concentrating or making decisions? Y N  Comment has some memory issues -  Walking or climbing stairs? N N  Dressing or bathing? N N  Doing errands, shopping? N N  Preparing Food and eating ? N -  Using the Toilet? N -  In the past six months, have you accidently leaked urine? N -  Do you have problems with loss of bowel control? N -  Managing your Medications? N -  Managing your Finances? N -  Housekeeping or managing your Housekeeping? N -  Some recent data might be hidden    Patient Care Team: [name], NP as PCP - General (Internal Medicine) [name], MD as PCP - Cardiology (Cardiology) Earlie Server, MD as Consulting Physician (Hematology and Oncology)  Indicate any recent Medical Services you may have received from other than [facility] providers in the past year (date may be approximate).     Assessment:   This is a routine wellness examination for [place].  Hearing/Vision screen  Hearing Screening   125Hz  250Hz  500Hz  1000Hz  2000Hz  3000Hz  4000Hz  6000Hz  8000Hz   Right ear:  Left ear:           Vision Screening Comments: Advised patient to get annual eye exams   Dietary issues and exercise activities discussed: Current Exercise Habits: The patient does not participate in regular exercise at present, Exercise limited by: None identified  Goals    . Patient Stated     10/11/2019, I will maintain and continue medications as prescribed.       Depression Screen PHQ 2/9 Scores 10/11/2019 11/19/2015  PHQ - 2  Score 0 0  PHQ- 9 Score 0 -    Fall Risk Fall Risk  10/11/2019  Falls in the past year? 1  Comment tripped over rock  Number falls in past yr: 0  Injury with Fall? 0  Risk for fall due to : Medication side effect  Follow up Falls evaluation completed;Falls prevention discussed    Any stairs in or around the home? Yes  If so, are there any without handrails? No  Home free of loose throw rugs in walkways, pet beds, electrical cords, etc? Yes  Adequate lighting in your home to reduce risk of falls? Yes   ASSISTIVE DEVICES UTILIZED TO PREVENT FALLS:  Life alert? No  Use of a cane, walker or w/c? Yes  Grab bars in the bathroom? Yes  Shower chair or bench in shower? No  Elevated toilet seat or a handicapped toilet? Yes   TIMED UP AND GO:  Was the test performed? N/A, telephonic visit.    Cognitive Function: MMSE - Mini Mental State Exam 10/11/2019  Not completed: Unable to complete       Mini Cog  Mini-Cog screen was completed. Maximum score is 22. A value of 0 denotes this part of the MMSE was not completed or the patient failed this part of the Mini-Cog screening.  Immunizations Immunization History  Administered Date(s) Administered  . Influenza Split 01/29/2012  . Influenza-Unspecified 02/16/2017, 02/17/2018  . PFIZER SARS-COV-2 Vaccination 05/27/2019, 06/18/2019  . Pneumococcal Polysaccharide-23 01/29/2012, 06/04/2014, 08/10/2015  . Tdap 01/20/2018    TDAP status: Up to date Flu Vaccine status: due Fall 2021 Pneumococcal vaccine status: due, will get at next office visit Covid-19 vaccine status: Completed vaccines  Qualifies for Shingles Vaccine? Yes   Zostavax completed No   Shingrix Completed?: No.    Education has been provided regarding the importance of this vaccine. Patient has been advised to call insurance company to determine out of pocket expense if they have not yet received this vaccine. Advised may also receive vaccine at local pharmacy or Health  Dept. Verbalized acceptance and understanding.  Screening Tests Health Maintenance  Topic Date Due  . FOOT EXAM  Never done  . OPHTHALMOLOGY EXAM  Never done  . URINE MICROALBUMIN  Never done  . PNA vac Low Risk Adult (2 of 2 - PCV13) 08/09/2016  . INFLUENZA VACCINE  11/12/2019  . HEMOGLOBIN A1C  01/27/2020  . TETANUS/TDAP  01/21/2028  . COVID-19 Vaccine  Completed    Health Maintenance  Health Maintenance Due  Topic Date Due  . FOOT EXAM  Never done  . OPHTHALMOLOGY EXAM  Never done  . URINE MICROALBUMIN  Never done  . PNA vac Low Risk Adult (2 of 2 - PCV13) 08/09/2016    Colorectal cancer screening: No longer required.   Lung Cancer Screening: (Low Dose CT Chest recommended if Age 19-80 years, 30 pack-year currently smoking OR have quit w/in 15years.) does not qualify.    Additional Screening:  Hepatitis C Screening:  does not qualify; Completed N/A  Vision Screening: Recommended annual ophthalmology exams for early detection of glaucoma and other disorders of the eye. Is the patient up to date with their annual eye exam?  No  Who is the provider or what is the name of the office in which the patient attends annual eye exams? Does not remember If pt is not established with a provider, would they like to be referred to a provider to establish care? No .   Dental Screening: Recommended annual dental exams for proper oral hygiene  Community Resource Referral / Chronic Care Management: CRR required this visit?  No   CCM required this visit?  No      Plan:     I have personally reviewed and noted the following in the patient's chart:   . Medical and social history . Use of alcohol, tobacco or illicit drugs  . Current medications and supplements . Functional ability and status . Nutritional status . Physical activity . Advanced directives . List of other physicians . Hospitalizations, surgeries, and ER visits in previous 12 months . Vitals . Screenings to  include cognitive, depression, and falls . Referrals and appointments  In addition, I have reviewed and discussed with patient certain preventive protocols, quality metrics, and best practice recommendations. A written personalized care plan for preventive services as well as general preventive health recommendations were provided to patient.   Due to this being a telephonic visit, the after visit summary with patients personalized plan was offered to patient via mail or my-chart. Patient preferred to pick up at office at next visit.   Andrez Grime, LPN   [id number]

## 2019-10-11 NOTE — Patient Instructions (Signed)
Leonard Berg , Thank you for taking time to come for your Medicare Wellness Visit. I appreciate your ongoing commitment to your health goals. Please review the following plan we discussed and let me know if I can assist you in the future.   Screening recommendations/referrals: Colonoscopy: Up to date, completed 05/20/2019, no longer required Recommended yearly ophthalmology/optometry visit for glaucoma screening and checkup Recommended yearly dental visit for hygiene and checkup  Vaccinations: Influenza vaccine: due 11/2019 Pneumococcal vaccine: due Tdap vaccine: Up to date, completed 01/20/2018, due 01/2028 Shingles vaccine: due, check with insurance regarding coverage   Covid-19: Completed series  Advanced directives: Please bring a copy of your POA (Power of Attorney) and/or Living Will to your next appointment.   Conditions/risks identified: diabetes, hypertension, hyperlipidemia  Next appointment: Follow up in one year for your annual wellness visit.   Preventive Care 84 Years and Older, Male Preventive care refers to lifestyle choices and visits with your health care provider that can promote health and wellness. What does preventive care include?  A yearly physical exam. This is also called an annual well check.  Dental exams once or twice a year.  Routine eye exams. Ask your health care provider how often you should have your eyes checked.  Personal lifestyle choices, including:  Daily care of your teeth and gums.  Regular physical activity.  Eating a healthy diet.  Avoiding tobacco and drug use.  Limiting alcohol use.  Practicing safe sex.  Taking low doses of aspirin every day.  Taking vitamin and mineral supplements as recommended by your health care provider. What happens during an annual well check? The services and screenings done by your health care provider during your annual well check will depend on your age, overall health, lifestyle risk factors, and  family history of disease. Counseling  Your health care provider may ask you questions about your:  Alcohol use.  Tobacco use.  Drug use.  Emotional well-being.  Home and relationship well-being.  Sexual activity.  Eating habits.  History of falls.  Memory and ability to understand (cognition).  Work and work Statistician. Screening  You may have the following tests or measurements:  Height, weight, and BMI.  Blood pressure.  Lipid and cholesterol levels. These may be checked every 5 years, or more frequently if you are over 51 years old.  Skin check.  Lung cancer screening. You may have this screening every year starting at age 14 if you have a 30-pack-year history of smoking and currently smoke or have quit within the past 15 years.  Fecal occult blood test (FOBT) of the stool. You may have this test every year starting at age 20.  Flexible sigmoidoscopy or colonoscopy. You may have a sigmoidoscopy every 5 years or a colonoscopy every 10 years starting at age 83.  Prostate cancer screening. Recommendations will vary depending on your family history and other risks.  Hepatitis C blood test.  Hepatitis B blood test.  Sexually transmitted disease (STD) testing.  Diabetes screening. This is done by checking your blood sugar (glucose) after you have not eaten for a while (fasting). You may have this done every 1-3 years.  Abdominal aortic aneurysm (AAA) screening. You may need this if you are a current or former smoker.  Osteoporosis. You may be screened starting at age 39 if you are at high risk. Talk with your health care provider about your test results, treatment options, and if necessary, the need for more tests. Vaccines  Your health care  provider may recommend certain vaccines, such as:  Influenza vaccine. This is recommended every year.  Tetanus, diphtheria, and acellular pertussis (Tdap, Td) vaccine. You may need a Td booster every 10 years.  Zoster  vaccine. You may need this after age 67.  Pneumococcal 13-valent conjugate (PCV13) vaccine. One dose is recommended after age 17.  Pneumococcal polysaccharide (PPSV23) vaccine. One dose is recommended after age 67. Talk to your health care provider about which screenings and vaccines you need and how often you need them. This information is not intended to replace advice given to you by your health care provider. Make sure you discuss any questions you have with your health care provider. Document Released: 04/26/2015 Document Revised: 12/18/2015 Document Reviewed: 01/29/2015 Elsevier Interactive Patient Education  2017 [place] Prevention in the Home Falls can cause injuries. They can happen to people of all ages. There are many things you can do to make your home safe and to help prevent falls. What can I do on the outside of my home?  Regularly fix the edges of walkways and driveways and fix any cracks.  Remove anything that might make you trip as you walk through a door, such as a raised step or threshold.  Trim any bushes or trees on the path to your home.  Use bright outdoor lighting.  Clear any walking paths of anything that might make someone trip, such as rocks or tools.  Regularly check to see if handrails are loose or broken. Make sure that both sides of any steps have handrails.  Any raised decks and porches should have guardrails on the edges.  Have any leaves, snow, or ice cleared regularly.  Use sand or salt on walking paths during winter.  Clean up any spills in your garage right away. This includes oil or grease spills. What can I do in the bathroom?  Use night lights.  Install grab bars by the toilet and in the tub and shower. Do not use towel bars as grab bars.  Use non-skid mats or decals in the tub or shower.  If you need to sit down in the shower, use a plastic, non-slip stool.  Keep the floor dry. Clean up any water that spills on the  floor as soon as it happens.  Remove soap buildup in the tub or shower regularly.  Attach bath mats securely with double-sided non-slip rug tape.  Do not have throw rugs and other things on the floor that can make you trip. What can I do in the bedroom?  Use night lights.  Make sure that you have a light by your bed that is easy to reach.  Do not use any sheets or blankets that are too big for your bed. They should not hang down onto the floor.  Have a firm chair that has side arms. You can use this for support while you get dressed.  Do not have throw rugs and other things on the floor that can make you trip. What can I do in the kitchen?  Clean up any spills right away.  Avoid walking on wet floors.  Keep items that you use a lot in easy-to-reach places.  If you need to reach something above you, use a strong step stool that has a grab bar.  Keep electrical cords out of the way.  Do not use floor polish or wax that makes floors slippery. If you must use wax, use non-skid floor wax.  Do not have throw  rugs and other things on the floor that can make you trip. What can I do with my stairs?  Do not leave any items on the stairs.  Make sure that there are handrails on both sides of the stairs and use them. Fix handrails that are broken or loose. Make sure that handrails are as long as the stairways.  Check any carpeting to make sure that it is firmly attached to the stairs. Fix any carpet that is loose or worn.  Avoid having throw rugs at the top or bottom of the stairs. If you do have throw rugs, attach them to the floor with carpet tape.  Make sure that you have a light switch at the top of the stairs and the bottom of the stairs. If you do not have them, ask someone to add them for you. What else can I do to help prevent falls?  Wear shoes that:  Do not have high heels.  Have rubber bottoms.  Are comfortable and fit you well.  Are closed at the toe. Do not wear  sandals.  If you use a stepladder:  Make sure that it is fully opened. Do not climb a closed stepladder.  Make sure that both sides of the stepladder are locked into place.  Ask someone to hold it for you, if possible.  Clearly mark and make sure that you can see:  Any grab bars or handrails.  First and last steps.  Where the edge of each step is.  Use tools that help you move around (mobility aids) if they are needed. These include:  Canes.  Walkers.  Scooters.  Crutches.  Turn on the lights when you go into a dark area. Replace any light bulbs as soon as they burn out.  Set up your furniture so you have a clear path. Avoid moving your furniture around.  If any of your floors are uneven, fix them.  If there are any pets around you, be aware of where they are.  Review your medicines with your doctor. Some medicines can make you feel dizzy. This can increase your chance of falling. Ask your doctor what other things that you can do to help prevent falls. This information is not intended to replace advice given to you by your health care provider. Make sure you discuss any questions you have with your health care provider. Document Released: 01/24/2009 Document Revised: 09/05/2015 Document Reviewed: 05/04/2014 Elsevier Interactive Patient Education  2017 [employer].

## 2019-10-11 NOTE — Progress Notes (Signed)
PCP notes:  Health Maintenance: Prevnar 13- due Eye exam- due Foot exam- due   Abnormal Screenings: none   Patient concerns: none  Nurse concerns: none   Next PCP appt.: 11/28/2019 @ 8:20 am

## 2019-10-23 DIAGNOSIS — D2261 Melanocytic nevi of right upper limb, including shoulder: Secondary | ICD-10-CM | POA: Diagnosis not present

## 2019-10-23 DIAGNOSIS — D2262 Melanocytic nevi of left upper limb, including shoulder: Secondary | ICD-10-CM | POA: Diagnosis not present

## 2019-10-23 DIAGNOSIS — Z85828 Personal history of other malignant neoplasm of skin: Secondary | ICD-10-CM | POA: Diagnosis not present

## 2019-10-23 DIAGNOSIS — D225 Melanocytic nevi of trunk: Secondary | ICD-10-CM | POA: Diagnosis not present

## 2019-11-28 ENCOUNTER — Ambulatory Visit: Payer: Medicare Other | Admitting: Primary Care

## 2019-11-29 ENCOUNTER — Ambulatory Visit (INDEPENDENT_AMBULATORY_CARE_PROVIDER_SITE_OTHER): Payer: Medicare Other | Admitting: Primary Care

## 2019-11-29 ENCOUNTER — Other Ambulatory Visit: Payer: Self-pay

## 2019-11-29 DIAGNOSIS — E038 Other specified hypothyroidism: Secondary | ICD-10-CM | POA: Diagnosis not present

## 2019-11-29 DIAGNOSIS — K5900 Constipation, unspecified: Secondary | ICD-10-CM

## 2019-11-29 LAB — TSH: TSH: 2.81 u[IU]/mL (ref 0.35–4.50)

## 2019-11-29 NOTE — Progress Notes (Signed)
Subjective:    Patient ID: [facility] Footman Berg., male    DOB: 19-Feb-1930, 86 y.o.   MRN: [id number]  HPI  This visit occurred during the SARS-CoV-2 public health emergency.  Safety protocols were in place, including screening questions prior to the visit, additional usage of staff PPE, and extensive cleaning of exam room while observing appropriate contact time as indicated for disinfecting solutions.   Leonard Berg is a 86 year old male with a history of CAD, hypertension, paroxysmal atrial fibrillation, GIB, hypothyroidism, type 2 diabetes, osteoarthritis, BPH who presents today for follow up.  1) Hypothyroidism: Currently managed on TSH of 0.18 in June 2021 and at that time we inquired if he were actually taking the 100 mcg dose prescribed. His daughter replied and endorsed that he continued to take the 112 mcg dose so we discussed to decrease to 100 mcg and follow up in a few months.  Since his last visit he is compliant to levothyroxine 100 mcg. He is taking his levothyroxine, but is not sure what dose. His family sorts his medications for him. He is taking his levothyroxine first thing in the morning, no food or other medications for about 30 minutes.   2) Constipation: Currently managed on Linzess 145 mcg and OTC Stool Softeners for which he takes every other day. With this regimen he will have a bowel movement once every other day. He experiences diarrhea when taking Linzess daily.   BP Readings from Last 3 Encounters:  11/29/19 120/66  09/29/19 (!) 117/52  07/28/19 120/62     Review of Systems  Constitutional: Positive for fatigue.  Respiratory: Negative for shortness of breath.   Cardiovascular: Negative for chest pain.  Gastrointestinal: Positive for constipation.  Neurological: Negative for dizziness.       Past Medical History:  Diagnosis Date  . Anemia   . Anxiety   . Arrhythmia    PAROXYSMAL ATRIAL FIBRILLATION  . Arthritis    OSTEOARTHRITIS  .  Cataracts, bilateral   . Chronic back pain   . Coronary artery disease 8/11   CABG...LM EMERGENT WITH IABP sees Dr. Legrand Como cooper  . DJD (degenerative joint disease)   . DVT (deep venous thrombosis) ([place])   . Dyspnea   . GERD (gastroesophageal reflux disease)   . History of BPH   . Hyperlipidemia   . Hypertension   . Hypothyroidism   . Iron deficiency anemia due to chronic blood loss 06/29/2019  . Lumbar post-laminectomy syndrome   . Myocardial infarction ([place])   . Pulmonary embolism ([place]) 2009   hx of  . RBBB (right bundle branch block)   . Renal artery stenosis ([place])   . Thrombocytopenia ([place])   . Thyroid disease    HYPOTHYROIDISM  . Type 2 diabetes mellitus without complication, without long-term current use of insulin ([place]) 07/28/2019  . Unstable angina ([facility])    NONE IN LONG TIME     Social History   Socioeconomic History  . Marital status: Married    Spouse name: Not on file  . Number of children: 2  . Years of education: Not on file  . Highest education level: Not on file  Occupational History  . 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 Smoking status: Former Smoker    Types: Cigarettes    Quit date: 04/13/1974    Years since quitting: 45.6  . Smokeless tobacco: Former  User    Types: Chew    Quit date: 01/26/2002  . Tobacco comment: He has smoked about 27-pack-year hx   Vaping Use  . Vaping Use: Never used  Substance and Sexual Activity  . Alcohol use: Not Currently    Alcohol/week: 2.0 standard drinks    Types: 2 Cans of beer per week    Comment: He used to drink more heavily , but rarely drinks at the current time  . Drug use: No  . Sexual activity: Not on file  Other Topics Concern  . Not on file  Social History Narrative   LIVES IN [place] WITH HIS WIFE.   HE HAS 2 GROWN CHILDREN   HE IS RETIRED PRISON FIRM SUPERINTENDENT.   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   HE GOES TO THE [employer] 3 X WEEKLY.   Social Determinants of Health   Financial Resource Strain: Low Risk   . Difficulty of Paying Living Expenses: Not hard at all  Food Insecurity: No Food Insecurity  . Worried About Charity fundraiser in the Last Year: Never true  . Ran Out of Food in the Last Year: Never true  Transportation Needs: No Transportation Needs  . Lack of Transportation (Medical): No  . Lack of Transportation (Non-Medical): No  Physical Activity: Inactive  . Days of Exercise per Week: 0 days  . Minutes of Exercise per Session: 0 min  Stress: No Stress Concern Present  . Feeling of Stress : Not at all  Social Connections:   . Frequency of Communication with Friends and Family:   . Frequency of Social Gatherings with Friends and Family:   . Attends Religious Services:   . Active Member of Clubs or Organizations:   . Attends Archivist Meetings:   Marland Kitchen Marital Status:   Intimate Partner Violence: Not At Risk  . Fear of Current or Ex-Partner: No  . Emotionally Abused: No  . Physically Abused: No  . Sexually Abused: No    Past Surgical History:  Procedure Laterality Date  . Blood clot  Feb.20,  2016   Pulmonary Embolism  . CARDIOVERSION N/A 03/15/2019   Procedure: CARDIOVERSION;  Surgeon: Pixie Casino, MD;  Location[facility] ENDOSCOPY;  Service: Cardiovascular;  Laterality: N/A;  . CAROTID ENDARTERECTOMY Right Oct. 17, 2013   CE  . CAROTID ENDARTERECTOMY Left Dec. 3, 2016   CE  . CATARACT EXTRACTION  05/2014,06/2014  . COLONOSCOPY WITH PROPOFOL N/A 05/20/2019   Procedure: COLONOSCOPY WITH PROPOFOL;  Surgeon: Rush Landmark Telford Nab., MD;  Location: [place];  Service: Gastroenterology;  Laterality: N/A;  . CORONARY ARTERY BYPASS GRAFT  11-21-09   EMERGENT X 3 GRAFTING. ..[name], MD, cc: [name]  . ENDARTERECTOMY  01/28/2012   Procedure: ENDARTERECTOMY CAROTID;  Surgeon: [name], MD;   Location: [place];  Service: Vascular;  Laterality: Right;  with Primary Closure of Artery  . ENDARTERECTOMY  03/15/2012   Procedure: ENDARTERECTOMY CAROTID;  Surgeon: [name], MD;  Location: [facility];  Service: Vascular;  Laterality: Left;  . ENTEROSCOPY N/A 05/23/2019   Procedure: ENTEROSCOPY;  Surgeon: [name], MD;  Location: [place];  Service: Endoscopy;  Laterality: N/A;  . ESOPHAGOGASTRODUODENOSCOPY N/A 05/20/2019   Procedure: ESOPHAGOGASTRODUODENOSCOPY (EGD);  Surgeon: [name]., MD;  Location: [facility];  Service: Gastroenterology;  Laterality: N/A;  . ESOPHAGOGASTRODUODENOSCOPY (EGD) WITH PROPOFOL N/A 05/19/2019   Procedure: ESOPHAGOGASTRODUODENOSCOPY (EGD) WITH  PROPOFOL;  Surgeon: [name], MD;  Location: [facility];  Service: Endoscopy;  Laterality: N/A;  . FOOT SURGERY    . GIVENS CAPSULE STUDY N/A 05/20/2019   Procedure: GIVENS CAPSULE STUDY;  Surgeon: [name]., MD;  Location: [facility];  Service: Gastroenterology;  Laterality: N/A;  . HOT HEMOSTASIS N/A 05/23/2019   Procedure: HOT HEMOSTASIS (ARGON PLASMA COAGULATION/BICAP);  Surgeon: [name], MD;  Location: [facility];  Service: Endoscopy;  Laterality: N/A;  . MANDIBLE SURGERY    . MAXIMUM ACCESS (MAS)POSTERIOR LUMBAR INTERBODY FUSION (PLIF) 2 LEVEL N/A 08/08/2015   Procedure: Lumbar Four-Five, Lumbar Five-Sacral One Maximum access posterior lumbar interbody fusion;  Surgeon: [name], MD;  Location: [facility] NEURO ORS;  Service: Neurosurgery;  Laterality: N/A;  LUMBAR FOUR-FIVE ,LUMBAR FIVE -SACRAL Maximum access posterior lumbar interbody fusion  . POLYPECTOMY  05/20/2019   Procedure: POLYPECTOMY;  Surgeon: [name], Telford Nab., MD;  Location: [facility] ENDOSCOPY;  Service: Gastroenterology;;  . RENAL ARTERY STENT  2010  . REPLACEMENT TOTAL KNEE  2006   RIGHT  . SPINAL CORD STIMULATOR INSERTION N/A 06/18/2017   Procedure: LUMBAR SPINAL CORD STIMULATOR INSERTION;  Surgeon:  [name], MD;  Location: [place];  Service: Neurosurgery;  Laterality: N/A;  LUMBAR SPINAL CORD STIMULATOR INSERTION  . TOTAL HIP ARTHROPLASTY     right    Family History  Problem Relation Age of Onset  . Lung cancer Father 59       deceased   . Cancer Father 21       LUNG  . Stroke Mother 80       DECEASED   . Hyperlipidemia Mother   . Heart attack Brother 58       deceased  . Diabetes Brother   . Liver cancer Sister 36       deceased   . Cancer Sister     No Known Allergies  Current Outpatient Medications on File Prior to Visit  Medication Sig Dispense Refill  . amiodarone (PACERONE) 200 MG tablet Take 1 tablet by mouth twice daily for 7 days, then take 1 tablet by mouth daily. 97 tablet 3  . cetirizine (ZYRTEC) 10 MG tablet TAKE 1 TABLET BY MOUTH ONCE DAILY 30 tablet 5  . docusate sodium (COLACE) 100 MG capsule Take 2 capsules (200 mg total) by mouth daily. 10 capsule 0  . furosemide (LASIX) 40 MG tablet Take 1 tablet (40 mg total) by mouth daily. (Patient taking differently: Take 40 mg by mouth 2 (two) times daily. ) 90 tablet 1  . guaiFENesin (MUCINEX) 600 MG 12 hr tablet Take 600 mg by mouth 2 (two) times daily as needed for cough or to loosen phlegm.    Marland Kitchen levothyroxine (SYNTHROID) 100 MCG tablet TAKE 1 TAB BY MOUTH ONCE DAILY. TAKE ON AN EMPTY STOMACH WITH A GLASS OF WATER ATLEAST 30-60 MINUTES BEFORE BREAKFAST 90 tablet 0  . LINZESS 145 MCG CAPS capsule Take 145 mcg by mouth daily.    . metoprolol succinate (TOPROL XL) 25 MG 24 hr tablet Take 1 tablet (25 mg total) by mouth daily. 90 tablet 3  . mometasone (NASONEX) 50 MCG/ACT nasal spray Place 2 sprays into the nose daily as needed (for allergies).    . Multiple Vitamin (MULTIVITAMIN WITH MINERALS) TABS tablet Take 1 tablet by mouth daily.    . Rivaroxaban (XARELTO) 15 MG TABS tablet Take 1 tablet (15 mg total) by mouth daily with supper. 90 tablet 3  . rosuvastatin (CRESTOR) 10 MG  tablet TAKE ONE TABLET BY MOUTH IN  THE EVENING FOR CHOLESTEROL. 90 tablet 1  . tamsulosin (FLOMAX) 0.4 MG CAPS capsule TAKE 1 CAPSULE BY MOUTH ONCE DAILY 90 capsule 1  . triamcinolone cream (KENALOG) 0.1 % Apply 1 application topically 2 (two) times daily as needed (affected area(s) of ankle.).    Marland Kitchen vitamin B-12 (CYANOCOBALAMIN) 1000 MCG tablet Take 1 tablet (1,000 mcg total) by mouth daily. 90 tablet 1   No current facility-administered medications on file prior to visit.    BP 120/66   Pulse 68   Temp (!) 96.9 F (36.1 C) (Temporal)   Ht 6' (1.829 m)   Wt 224 lb 8 oz (101.8 kg)   SpO2 95%   BMI 30.45 kg/m    Objective:   Physical Exam Cardiovascular:     Rate and Rhythm: Normal rate and regular rhythm.  Pulmonary:     Effort: Pulmonary effort is normal.     Breath sounds: Normal breath sounds.  Musculoskeletal:     Cervical back: Neck supple.  Skin:    General: Skin is warm and dry.  Neurological:     Mental Status: He is oriented to person, place, and time.  Psychiatric:        Mood and Affect: Mood normal.            Assessment & Plan:

## 2019-11-29 NOTE — Patient Instructions (Signed)
Stop by the lab prior to leaving today. I will notify you of your results once received.   Be sure to take your levothyroxine (thyroid medication) every morning on an empty stomach with water only. No food or other medications for 30 minutes. No heartburn medication, iron pills, calcium, vitamin D, or magnesium pills within four hours of taking levothyroxine.   Be sure that you are taking 100 mcg of levothyroxine, not 112 mcg.  Start taking your stool softener once daily for constipation. Continue taking Linzess every other day to prevent diarrhea.  It was a pleasure to see you today!

## 2019-11-29 NOTE — Assessment & Plan Note (Signed)
Overall improved, but prefers to have daily bowel movements. Discussed to increase stool softener to daily use, continue Linzess to every other day. He will update.

## 2019-11-29 NOTE — Assessment & Plan Note (Signed)
TSH in June 2021 too low, but he was also taking 112 mcg rather than 100 mcg as previously instructed.   Today he is alone, doesn't recall which dose of medication he is taking. He does seem to be taking levothyroxine correctly in general.   Repeat TSH pending, if not at goal then will need to discuss with family.

## 2019-12-15 ENCOUNTER — Other Ambulatory Visit: Payer: Self-pay | Admitting: Oncology

## 2019-12-21 ENCOUNTER — Telehealth: Payer: Self-pay

## 2019-12-21 NOTE — Telephone Encounter (Signed)
He is supposed to be using Linzess every other day, how is he doing with this?

## 2019-12-21 NOTE — Telephone Encounter (Signed)
Linzess refill request.  Not originally prescribed here.

## 2019-12-22 MED ORDER — LINZESS 145 MCG PO CAPS: 145.0000 ug | ORAL_CAPSULE | Freq: Every day | ORAL | 0 refills | Status: DC

## 2019-12-22 NOTE — Telephone Encounter (Signed)
Per patient son, patient is not having issues with diarrhea.  Refill sent.

## 2019-12-22 NOTE — Telephone Encounter (Signed)
Per patients son he is taking Linzess 127mcg 1 a day before breakfast.

## 2019-12-22 NOTE — Telephone Encounter (Signed)
Noted.  As long as he is not having diarrhea we can continue daily Linzess administration. Please confirm this, then okay to send 90-day supply with no refills to pharmacy.

## 2019-12-27 ENCOUNTER — Encounter: Payer: Self-pay | Admitting: Primary Care

## 2019-12-27 LAB — PTH, INTACT: PTH: 47

## 2020-01-31 DIAGNOSIS — Z23 Encounter for immunization: Secondary | ICD-10-CM | POA: Diagnosis not present

## 2020-01-31 DIAGNOSIS — N2581 Secondary hyperparathyroidism of renal origin: Secondary | ICD-10-CM | POA: Diagnosis not present

## 2020-01-31 DIAGNOSIS — N183 Chronic kidney disease, stage 3 unspecified: Secondary | ICD-10-CM | POA: Diagnosis not present

## 2020-01-31 DIAGNOSIS — N189 Chronic kidney disease, unspecified: Secondary | ICD-10-CM | POA: Diagnosis not present

## 2020-01-31 DIAGNOSIS — I129 Hypertensive chronic kidney disease with stage 1 through stage 4 chronic kidney disease, or unspecified chronic kidney disease: Secondary | ICD-10-CM | POA: Diagnosis not present

## 2020-01-31 LAB — COMPREHENSIVE METABOLIC PANEL
Albumin: 4.1 (ref 3.5–5.0)
Calcium: 9 (ref 8.7–10.7)
GFR calc non Af Amer: 34

## 2020-01-31 LAB — CBC AND DIFFERENTIAL
HCT: 43 (ref 41–53)
Hemoglobin: 14.6 (ref 13.5–17.5)
Neutrophils Absolute: 2.9
Platelets: 165 (ref 150–399)
WBC: 5.6

## 2020-01-31 LAB — BASIC METABOLIC PANEL
BUN: 20 (ref 4–21)
CO2: 27 — AB (ref 13–22)
Chloride: 1 — AB (ref 99–108)
Creatinine: 1.7 — AB (ref 0.6–1.3)
Glucose: 104
Potassium: 4 (ref 3.4–5.3)
Sodium: 140 (ref 137–147)

## 2020-01-31 LAB — CBC: RBC: 4.41 (ref 3.87–5.11)

## 2020-01-31 LAB — VITAMIN D 25 HYDROXY (VIT D DEFICIENCY, FRACTURES): Vit D, 25-Hydroxy: 24.4

## 2020-02-19 DIAGNOSIS — N183 Chronic kidney disease, stage 3 unspecified: Secondary | ICD-10-CM | POA: Diagnosis not present

## 2020-03-03 ENCOUNTER — Other Ambulatory Visit: Payer: Self-pay | Admitting: Primary Care

## 2020-03-03 DIAGNOSIS — E038 Other specified hypothyroidism: Secondary | ICD-10-CM

## 2020-03-04 ENCOUNTER — Encounter: Payer: Self-pay | Admitting: Primary Care

## 2020-03-05 ENCOUNTER — Other Ambulatory Visit (INDEPENDENT_AMBULATORY_CARE_PROVIDER_SITE_OTHER): Payer: Medicare Other

## 2020-03-05 DIAGNOSIS — E038 Other specified hypothyroidism: Secondary | ICD-10-CM

## 2020-03-05 LAB — TSH: TSH: 5.92 u[IU]/mL — ABNORMAL HIGH (ref 0.35–4.50)

## 2020-03-06 ENCOUNTER — Other Ambulatory Visit: Payer: Self-pay | Admitting: Primary Care

## 2020-03-06 DIAGNOSIS — E038 Other specified hypothyroidism: Secondary | ICD-10-CM

## 2020-03-08 ENCOUNTER — Other Ambulatory Visit: Payer: Self-pay

## 2020-03-11 ENCOUNTER — Encounter: Payer: Self-pay | Admitting: Primary Care

## 2020-03-11 ENCOUNTER — Ambulatory Visit: Payer: Medicare Other | Admitting: Primary Care

## 2020-03-11 ENCOUNTER — Other Ambulatory Visit: Payer: Self-pay

## 2020-03-11 VITALS — BP 118/64 | HR 68 | Temp 97.6°F | Ht 72.0 in | Wt 231.0 lb

## 2020-03-11 DIAGNOSIS — E119 Type 2 diabetes mellitus without complications: Secondary | ICD-10-CM

## 2020-03-11 DIAGNOSIS — I2699 Other pulmonary embolism without acute cor pulmonale: Secondary | ICD-10-CM

## 2020-03-11 DIAGNOSIS — I4891 Unspecified atrial fibrillation: Secondary | ICD-10-CM | POA: Diagnosis not present

## 2020-03-11 DIAGNOSIS — E038 Other specified hypothyroidism: Secondary | ICD-10-CM | POA: Diagnosis not present

## 2020-03-11 LAB — POCT GLYCOSYLATED HEMOGLOBIN (HGB A1C): Hemoglobin A1C: 5.9 % — AB (ref 4.0–5.6)

## 2020-03-11 NOTE — Assessment & Plan Note (Signed)
Rate and rhythm regular today, continue Xarelto 15 mg daily.

## 2020-03-11 NOTE — Assessment & Plan Note (Signed)
We called pharmacy regarding cost of Xarelto, he is currently in the donut hole with insurance. Next refill should be covered by insurance.   Discussed this with patient today.  Continue Xarelto 15 mg daily.

## 2020-03-11 NOTE — Assessment & Plan Note (Signed)
He is taking levothyroxine incorrectly, at bedtime rather than in AM as discussed numerous times.   We discussed thorough details regarding administration, also specific information regarding administrating was provided on his check out sheet.   Continue levothyroxine 100 mcg daily, repeat thyroid lab in 2 months.

## 2020-03-11 NOTE — Progress Notes (Signed)
Subjective:    Patient ID: Leonard [name], male    DOB: 08/22/1929, 86 y.o.   MRN: [id number]  HPI  This visit occurred during the SARS-CoV-2 public health emergency.  Safety protocols were in place, including screening questions prior to the visit, additional usage of staff PPE, and extensive cleaning of exam room while observing appropriate contact time as indicated for disinfecting solutions.   [name] is a 86 year old male with a history of hypothyroidism, type 2 diabetes, hypertension, pulmonary embolism, osteoarthritis, BPH, renal failure, renal artery atherosclerosis, GIB who presents today for follow up.  1) Hypothyroidism: Currently managed on levothyroxine 100 mcg for which he has been taking for the last 3+ months. Reduction from 112 mcg which caused too low of TSH.   TSH of 2.81 in August 2021 on levothyroxine 100 mcg and TSH of 5.92 in November 2021 on levothyroxine 100 mcg.   Today he admits to taking his levothyroxine at night for the last few months, he's not sure why. His family prepares his medication bottles.    2) Type 2 Diabetes:  Current medications include: None  He is checking his blood glucose _ times daily and is getting readings of  Last A1C: 5.4 in April 2021, 5.9 today Last Eye Exam: Due Last Foot Exam: Due Pneumonia Vaccination: Completed last in 2017 ACE/ARB: None Statin: Crestor  He has not seen podiatry, but endorses thickened, elongated toenails. Also bilateral great toenails have loosened and act as they may be coming off.   3) Paroxysmal Atrial Fibrillation/Pulmonary Embolism: Currently managed on Xaralto 15 mg since. Denies recent bleeding complications, although does have a history of GIB. His Xarelto is very expensive and costing him $300+ for a three month supply. He is requesting another option.   BP Readings from Last 3 Encounters:  03/11/20 118/64  11/29/19 120/66  09/29/19 (!) 117/52        Review of Systems   Eyes: Negative for visual disturbance.  Respiratory: Negative for shortness of breath.   Cardiovascular: Negative for chest pain.  Neurological: Negative for dizziness.       Past Medical History:  Diagnosis Date  . Anemia   . Anxiety   . Arrhythmia    PAROXYSMAL ATRIAL FIBRILLATION  . Arthritis    OSTEOARTHRITIS  . Cataracts, bilateral   . Chronic back pain   . Coronary artery disease 8/11   CABG...LM EMERGENT WITH IABP sees Dr. Legrand Como cooper  . DJD (degenerative joint disease)   . DVT (deep venous thrombosis) ([place])   . Dyspnea   . GERD (gastroesophageal reflux disease)   . History of BPH   . Hyperlipidemia   . Hypertension   . Hypothyroidism   . Iron deficiency anemia due to chronic blood loss 06/29/2019  . Lumbar post-laminectomy syndrome   . Myocardial infarction ([place])   . Pulmonary embolism ([place]) 2009   hx of  . RBBB (right bundle branch block)   . Renal artery stenosis ([place])   . Thrombocytopenia ([place])   . Thyroid disease    HYPOTHYROIDISM  . Type 2 diabetes mellitus without complication, without long-term current use of insulin ([place]) 07/28/2019  . Unstable angina ([facility])    NONE IN LONG TIME     Social History   Socioeconomic History  . Marital status: Married    Spouse name: Not on file  . Number of children: 2  . Years of education: Not on file  . Highest education level: Not  on file  Occupational History  . 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 Smoking status: Former Smoker    Types: Cigarettes    Quit date: 04/13/1974    Years since quitting: 45.9  . Smokeless tobacco: Former Systems developer    Types: Chew    Quit date: 01/26/2002  . Tobacco comment: He has smoked about 27-pack-year hx   Vaping Use  . Vaping Use: Never used  Substance and Sexual Activity  . Alcohol use: Not Currently    Alcohol/week: 2.0 standard drinks    Types: 2 Cans of beer per week    Comment: He used  to drink more heavily , but rarely drinks at the current time  . Drug use: No  . Sexual activity: Not on file  Other Topics Concern  . Not on file  Social History Narrative   LIVES IN [place] WITH HIS WIFE.   HE HAS 2 GROWN CHILDREN   HE IS RETIRED PRISON FIRM SUPERINTENDENT.   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   HE GOES TO THE [employer] 3 X WEEKLY.   Social Determinants of Health   Financial Resource Strain: Low Risk   . Difficulty of Paying Living Expenses: Not hard at all  Food Insecurity: No Food Insecurity  . Worried About Charity fundraiser in the Last Year: Never true  . Ran Out of Food in the Last Year: Never true  Transportation Needs: No Transportation Needs  . Lack of Transportation (Medical): No  . Lack of Transportation (Non-Medical): No  Physical Activity: Inactive  . Days of Exercise per Week: 0 days  . Minutes of Exercise per Session: 0 min  Stress: No Stress Concern Present  . Feeling of Stress : Not at all  Social Connections:   . Frequency of Communication with Friends and Family: Not on file  . Frequency of Social Gatherings with Friends and Family: Not on file  . Attends Religious Services: Not on file  . Active Member of Clubs or Organizations: Not on file  . Attends Archivist Meetings: Not on file  . Marital Status: Not on file  Intimate Partner Violence: Not At Risk  . Fear of Current or Ex-Partner: No  . Emotionally Abused: No  . Physically Abused: No  . Sexually Abused: No    Past Surgical History:  Procedure Laterality Date  . Blood clot  Feb.20,  2016   Pulmonary Embolism  . CARDIOVERSION N/A 03/15/2019   Procedure: CARDIOVERSION;  Surgeon: Pixie Casino, MD;  Location: [facility] ENDOSCOPY;  Service: Cardiovascular;  Laterality: N/A;  . CAROTID ENDARTERECTOMY Right Oct. 17, 2013   CE  . CAROTID ENDARTERECTOMY Left Dec. 3, 2016   CE  . CATARACT EXTRACTION  05/2014,06/2014  . COLONOSCOPY  WITH PROPOFOL N/A 05/20/2019   Procedure: COLONOSCOPY WITH PROPOFOL;  Surgeon: Rush Landmark Telford Nab., MD;  Location: [facility];  Service: Gastroenterology;  Laterality: N/A;  . CORONARY ARTERY BYPASS GRAFT  11-21-09   EMERGENT X 3 GRAFTING. ..[name], MD, cc: [name]  . ENDARTERECTOMY  01/28/2012   Procedure: ENDARTERECTOMY CAROTID;  Surgeon: [name], MD;  Location: [place];  Service: Vascular;  Laterality: Right;  with Primary Closure of Artery  . ENDARTERECTOMY  03/15/2012   Procedure: ENDARTERECTOMY CAROTID;  Surgeon: [name], MD;  Location: [facility];  Service: Vascular;  Laterality: Left;  . ENTEROSCOPY N/A 05/23/2019   Procedure: ENTEROSCOPY;  Surgeon: [name], MD;  Location: [facility];  Service: Endoscopy;  Laterality: N/A;  . ESOPHAGOGASTRODUODENOSCOPY N/A 05/20/2019   Procedure: ESOPHAGOGASTRODUODENOSCOPY (EGD);  Surgeon: [name]., MD;  Location: [facility];  Service: Gastroenterology;  Laterality: N/A;  . ESOPHAGOGASTRODUODENOSCOPY (EGD) WITH PROPOFOL N/A 05/19/2019   Procedure: ESOPHAGOGASTRODUODENOSCOPY (EGD) WITH PROPOFOL;  Surgeon: [name], MD;  Location: [place];  Service: Endoscopy;  Laterality: N/A;  . FOOT SURGERY    . GIVENS CAPSULE STUDY N/A 05/20/2019   Procedure: GIVENS CAPSULE STUDY;  Surgeon: [name]., MD;  Location: [facility];  Service: Gastroenterology;  Laterality: N/A;  . HOT HEMOSTASIS N/A 05/23/2019   Procedure: HOT HEMOSTASIS (ARGON PLASMA COAGULATION/BICAP);  Surgeon: [name], MD;  Location: [facility];  Service: Endoscopy;  Laterality: N/A;  . MANDIBLE SURGERY    . MAXIMUM ACCESS (MAS)POSTERIOR LUMBAR INTERBODY FUSION (PLIF) 2 LEVEL N/A 08/08/2015   Procedure: Lumbar Four-Five, Lumbar Five-Sacral One Maximum access posterior lumbar interbody fusion;  Surgeon: [name], MD;  Location: [facility] NEURO ORS;  Service: Neurosurgery;  Laterality: N/A;  LUMBAR FOUR-FIVE ,LUMBAR FIVE  -SACRAL Maximum access posterior lumbar interbody fusion  . POLYPECTOMY  05/20/2019   Procedure: POLYPECTOMY;  Surgeon: [name], Telford Nab., MD;  Location[facility] END[facility]COPY;  Service: Gastroenterology;;  . RENAL ARTERY STENT  2010  . REPLACEMENT TOTAL KNEE  2006   RIGHT  . SPINAL CORD STIMULATOR INSERTION N/A 06/18/2017   Procedure: LUMBAR SPINAL CORD STIMULATOR INSERTION;  Surgeon: [name], MD;  Location: [place];  Service: Neurosurgery;  Laterality: N/A;  LUMBAR SPINAL CORD STIMULATOR INSERTION  . TOTAL HIP ARTHROPLASTY     right    Family History  Problem Relation Age of Onset  . Lung cancer Father 24       deceased   . Cancer Father 13       LUNG  . Stroke Mother 69       DECEASED   . Hyperlipidemia Mother   . Heart attack Brother 80       deceased  . Diabetes Brother   . Liver cancer Sister 28       deceased   . Cancer Sister     No Known Allergies  Current Outpatient Medications on File Prior to Visit  Medication Sig Dispense Refill  . amiodarone (PACERONE) 200 MG tablet Take 1 tablet by mouth twice daily for 7 days, then take 1 tablet by mouth daily. 97 tablet 3  . cetirizine (ZYRTEC) 10 MG tablet TAKE 1 TABLET BY MOUTH ONCE DAILY 30 tablet 5  . docusate sodium (COLACE) 100 MG capsule Take 2 capsules (200 mg total) by mouth daily. 10 capsule 0  . furosemide (LASIX) 40 MG tablet Take 1 tablet (40 mg total) by mouth daily. (Patient taking differently: Take 40 mg by mouth 2 (two) times daily. ) 90 tablet 1  . guaiFENesin (MUCINEX) 600 MG 12 hr tablet Take 600 mg by mouth 2 (two) times daily as needed for cough or to loosen phlegm.    Marland Kitchen levothyroxine (SYNTHROID) 100 MCG tablet TAKE 1 TAB BY MOUTH ONCE DAILY. TAKE ON AN EMPTY STOMACH WITH A GLASS OF WATER ATLEAST 30-60 MINUTES BEFORE BREAKFAST 90 tablet 0  . LINZESS 145 MCG CAPS capsule Take 1 capsule (145 mcg total) by mouth daily before breakfast. 90 capsule 0  . metoprolol succinate (TOPROL XL) 25 MG 24 hr tablet Take 1  tablet (25 mg total) by mouth daily. 90 tablet 3  . mometasone (NASONEX) 50 MCG/ACT nasal spray Place 2 sprays into the nose daily as needed (for allergies).    . Multiple Vitamin (MULTIVITAMIN WITH MINERALS) TABS tablet Take 1 tablet by mouth daily.    . rosuvastatin (CRESTOR) 10 MG tablet TAKE ONE TABLET BY MOUTH IN THE EVENING FOR CHOLESTEROL. 90 tablet 1  . tamsulosin (FLOMAX) 0.4 MG CAPS capsule TAKE 1 CAPSULE BY MOUTH ONCE DAILY 90 capsule 1  . triamcinolone cream (KENALOG) 0.1 % Apply 1 application topically 2 (two) times daily as needed (affected area(s) of ankle.).    Marland Kitchen vitamin B-12 (CYANOCOBALAMIN) 1000 MCG tablet TAKE 1 TABLET BY MOUTH ONCE DAILY 90 tablet 1  . Rivaroxaban (XARELTO) 15 MG TABS tablet Take 1 tablet (15 mg total) by mouth daily with supper. 90 tablet 3   No current facility-administered medications on file prior to visit.    BP 118/64   Pulse 68   Temp 97.6 F (36.4 C) (Temporal)   Ht 6' (1.829 m)   Wt 231 lb (104.8 kg)   SpO2 98%   BMI 31.33 kg/m    Objective:   Physical Exam Cardiovascular:     Rate and Rhythm: Normal rate and regular rhythm.  Pulmonary:     Effort: Pulmonary effort is normal.     Breath sounds: Normal breath sounds.  Musculoskeletal:     Cervical back: Neck supple.  Skin:    General: Skin is warm and dry.     Comments: Yellow, thickened, elongated toenails bilaterally. Right great toenail appears to be loose and lifted from nailbed.             Assessment & Plan:

## 2020-03-11 NOTE — Patient Instructions (Addendum)
You MUST take your thyroid pill, levothyroxine, in the morning. Continue taking the 100 mcg dose.  Be sure to take your levothyroxine (thyroid medication) every morning on an empty stomach with water only. No food or other medications for 30 minutes. No heartburn medication, iron pills, calcium, vitamin D, or magnesium pills within four hours of taking levothyroxine.   You are in the donut hole with your Xarelto and Linzess. You have to pay full price for your Linzess once more, then your Xarelto will be covered under your insurance. We called [facility] today who notified us of this information.  You will be contacted regarding your referral to the foot doctor.  Please let us know if you have not been contacted within two weeks.   Schedule a lab appointment in 2 months to repeat your thyroid.   It was a pleasure to see you today!

## 2020-03-11 NOTE — Assessment & Plan Note (Signed)
Well controlled off medications. A1C today of 5.9.  Foot exam completed today, referral placed to podiatry for foot care.   Continue to monitor A1C.

## 2020-03-12 ENCOUNTER — Ambulatory Visit (INDEPENDENT_AMBULATORY_CARE_PROVIDER_SITE_OTHER): Payer: Medicare Other | Admitting: Podiatry

## 2020-03-12 ENCOUNTER — Encounter: Payer: Self-pay | Admitting: Podiatry

## 2020-03-12 ENCOUNTER — Other Ambulatory Visit: Payer: Self-pay

## 2020-03-12 DIAGNOSIS — L603 Nail dystrophy: Secondary | ICD-10-CM

## 2020-03-12 NOTE — Progress Notes (Signed)
Subjective:  Patient ID: Leonard [name], male    DOB: [birth year],  MRN: [id number]  Chief Complaint  Patient presents with  . Nail Problem    nail trim and numb,tingling in feet  . Foot Pain    86 y.o. male presents with the above complaint.  Patient presents with complaint of right hallux thickened elongated dystrophic nail that is very painful to touch.  Patient would like to have it removed.  He states is been dealing with this for quite some time.  He does not have any history of diabetes.  He has not tried any other treatment options.  He states the nailbed itself is sore.  It is 7 out of 10 is dull aching nature he would like to have removed not permanently   Review of Systems: Negative except as noted in the HPI. Denies N/V/F/Ch.  Past Medical History:  Diagnosis Date  . Anemia   . Anxiety   . Arrhythmia    PAROXYSMAL ATRIAL FIBRILLATION  . Arthritis    OSTEOARTHRITIS  . Cataracts, bilateral   . Chronic back pain   . Coronary artery disease 8/11   CABG...LM EMERGENT WITH IABP sees Dr. Legrand Como cooper  . DJD (degenerative joint disease)   . DVT (deep venous thrombosis) ([place])   . Dyspnea   . GERD (gastroesophageal reflux disease)   . History of BPH   . Hyperlipidemia   . Hypertension   . Hypothyroidism   . Iron deficiency anemia due to chronic blood loss 06/29/2019  . Lumbar post-laminectomy syndrome   . Myocardial infarction ([place])   . Pulmonary embolism ([place]) 2009   hx of  . RBBB (right bundle branch block)   . Renal artery stenosis ([place])   . Thrombocytopenia ([place])   . Thyroid disease    HYPOTHYROIDISM  . Type 2 diabetes mellitus without complication, without long-term current use of insulin ([place]) 07/28/2019  . Unstable angina ([facility])    NONE IN LONG TIME    Current Outpatient Medications:  .  amiodarone (PACERONE) 200 MG tablet, Take 1 tablet by mouth twice daily for 7 days, then take 1 tablet by mouth daily., Disp: 97 tablet, Rfl: 3 .  cetirizine  (ZYRTEC) 10 MG tablet, TAKE 1 TABLET BY MOUTH ONCE DAILY, Disp: 30 tablet, Rfl: 5 .  docusate sodium (COLACE) 100 MG capsule, Take 2 capsules (200 mg total) by mouth daily., Disp: 10 capsule, Rfl: 0 .  furosemide (LASIX) 40 MG tablet, Take 1 tablet (40 mg total) by mouth daily. (Patient taking differently: Take 40 mg by mouth 2 (two) times daily. ), Disp: 90 tablet, Rfl: 1 .  guaiFENesin (MUCINEX) 600 MG 12 hr tablet, Take 600 mg by mouth 2 (two) times daily as needed for cough or to loosen phlegm., Disp: , Rfl:  .  levothyroxine (SYNTHROID) 100 MCG tablet, TAKE 1 TAB BY MOUTH ONCE DAILY. TAKE ON AN EMPTY STOMACH WITH A GLASS OF WATER ATLEAST 30-60 MINUTES BEFORE BREAKFAST, Disp: 90 tablet, Rfl: 0 .  LINZESS 145 MCG CAPS capsule, Take 1 capsule (145 mcg total) by mouth daily before breakfast., Disp: 90 capsule, Rfl: 0 .  metoprolol succinate (TOPROL XL) 25 MG 24 hr tablet, Take 1 tablet (25 mg total) by mouth daily., Disp: 90 tablet, Rfl: 3 .  mometasone (NASONEX) 50 MCG/ACT nasal spray, Place 2 sprays into the nose daily as needed (for allergies)., Disp: , Rfl:  .  Multiple Vitamin (MULTIVITAMIN WITH MINERALS) TABS tablet, Take 1 tablet by  mouth daily., Disp: , Rfl:  .  Rivaroxaban (XARELTO) 15 MG TABS tablet, Take 1 tablet (15 mg total) by mouth daily with supper., Disp: 90 tablet, Rfl: 3 .  rosuvastatin (CRESTOR) 10 MG tablet, TAKE ONE TABLET BY MOUTH IN THE EVENING FOR CHOLESTEROL., Disp: 90 tablet, Rfl: 1 .  tamsulosin (FLOMAX) 0.4 MG CAPS capsule, TAKE 1 CAPSULE BY MOUTH ONCE DAILY, Disp: 90 capsule, Rfl: 1 .  triamcinolone cream (KENALOG) 0.1 %, Apply 1 application topically 2 (two) times daily as needed (affected area(s) of ankle.)., Disp: , Rfl:  .  vitamin B-12 (CYANOCOBALAMIN) 1000 MCG tablet, TAKE 1 TABLET BY MOUTH ONCE DAILY, Disp: 90 tablet, Rfl: 1  Social History   Tobacco Use  Smoking Status Former Smoker  . Types: Cigarettes  . Quit date: 04/13/1974  . Years since quitting: 45.9    Smokeless Tobacco Former Systems developer  . Types: Chew  . Quit date: 01/26/2002  Tobacco Comment   He has smoked about 27-pack-year hx     No Known Allergies Objective:  There were no vitals filed for this visit. There is no height or weight on file to calculate BMI. Constitutional Well developed. Well nourished.  Vascular Dorsalis pedis pulses palpable bilaterally. Posterior tibial pulses palpable bilaterally. Capillary refill normal to all digits.  No cyanosis or clubbing noted. Pedal hair growth normal.  Neurologic Normal speech. Oriented to person, place, and time. Epicritic sensation to light touch grossly present bilaterally.  Dermatologic Pain on palpation of the entire/total nail on 1st digit of the right No other open wounds. No skin lesions.  Orthopedic: Normal joint ROM without pain or crepitus bilaterally. No visible deformities. No bony tenderness.   Radiographs: None Assessment:   1. Onychodystrophy   2. Nail dystrophy    Plan:  Patient was evaluated and treated and all questions answered.  Nail contusion/dystrophy hallux, right -Patient elects to proceed with minor surgery to remove entire toenail today. Consent reviewed and signed by patient. -Entire/total nail excised. See procedure note. -Educated on post-procedure care including soaking. Written instructions provided and reviewed. -Patient to follow up in 2 weeks for nail check.  Procedure: Excision of entire/total nail  Location: Right 1st toe digit Anesthesia: Lidocaine 1% plain; 1.5 mL and Marcaine 0.5% plain; 1.5 mL, digital block. Skin Prep: Betadine. Dressing: Silvadene; telfa; dry, sterile, compression dressing. Technique: Following skin prep, the toe was exsanguinated and a tourniquet was secured at the base of the toe. The affected nail border was freed and excised. The tourniquet was then removed and sterile dressing applied. Disposition: Patient tolerated procedure well. Patient to return in 2  weeks for follow-up.   No follow-ups on file.

## 2020-04-02 ENCOUNTER — Other Ambulatory Visit: Payer: Self-pay | Admitting: Physician Assistant

## 2020-04-22 ENCOUNTER — Telehealth (INDEPENDENT_AMBULATORY_CARE_PROVIDER_SITE_OTHER): Payer: Medicare Other | Admitting: Primary Care

## 2020-04-22 ENCOUNTER — Telehealth: Payer: Self-pay

## 2020-04-22 ENCOUNTER — Other Ambulatory Visit (INDEPENDENT_AMBULATORY_CARE_PROVIDER_SITE_OTHER): Payer: Medicare Other

## 2020-04-22 VITALS — Ht 72.0 in | Wt 231.0 lb

## 2020-04-22 DIAGNOSIS — R82998 Other abnormal findings in urine: Secondary | ICD-10-CM | POA: Insufficient documentation

## 2020-04-22 DIAGNOSIS — R319 Hematuria, unspecified: Secondary | ICD-10-CM | POA: Diagnosis not present

## 2020-04-22 HISTORY — DX: Other abnormal findings in urine: R82.998

## 2020-04-22 LAB — POC URINALSYSI DIPSTICK (AUTOMATED)
Bilirubin, UA: NEGATIVE
Glucose, UA: NEGATIVE
Ketones, UA: NEGATIVE
Leukocytes, UA: NEGATIVE
Nitrite, UA: NEGATIVE
Protein, UA: NEGATIVE
Spec Grav, UA: 1.01 (ref 1.010–1.025)
Urobilinogen, UA: 0.2 E.U./dL
pH, UA: 5.5 (ref 5.0–8.0)

## 2020-04-22 LAB — URINALYSIS, MICROSCOPIC ONLY

## 2020-04-22 NOTE — Patient Instructions (Signed)
  We will be in touch once we receive your urine test results.  Increase your water intake to at least 3 bottles daily.  It was a pleasure to see you today!

## 2020-04-22 NOTE — Telephone Encounter (Signed)
Will evaluate.

## 2020-04-22 NOTE — Addendum Note (Signed)
Addended by: Ellamae Sia on: 04/22/2020 02:43 PM   Modules accepted: Orders

## 2020-04-22 NOTE — Telephone Encounter (Signed)
Patients son [name] sent in [place] message stating that his father was having blood in his urine, but now having any other symptoms. Called son to follow-up on this issue. Patients son stated that his father was still experiencing blood in his urine and having lower back pain. [name] stated that his father was not having any fever, chills, SOB, Chest Pain, Difficulty or Painful Urination. Spoke with Allie Bossier, NP regarding this issue. Carlis Abbott, NP stated that she could see the patient virtually since him and his son are on quarantine until Thursday. Patient has a video visit today with Allie Bossier, NP at 12:20. Informed patients son of this as well as need for urine sample. [name] verbalized understanding. Instructed [name] that he could come and pick up the urine sample supplies and return the urine. Patients son instructed to pull to the back of the building and call [phone] and the supplies would be brought to him.

## 2020-04-22 NOTE — Addendum Note (Signed)
Addended by: Francella Solian on: 04/22/2020 01:47 PM   Modules accepted: Orders

## 2020-04-22 NOTE — Assessment & Plan Note (Signed)
Acute discoloration to urine x 24 hours, nearly resolved now. Differentials include hematuria, UTI, dehydration. He does have a history of CKD. He doesn't seem to be experiencing renal stones.  Check UA today. Add culture to rule out infection. Consider adding urine micro if blood is present.  His son will drop off his urine test today.

## 2020-04-22 NOTE — Progress Notes (Signed)
Subjective:    Patient ID: Leonard Footman Berg., male    DOB: 02/24/1930, 87 y.o.   MRN: [id number]  HPI  Virtual Visit via Video Note  I connected with Leonard Footman Berg. on 04/22/20 at 12:20 PM EST by a video enabled telemedicine application and verified that I am speaking with the correct person using two identifiers.  Location: Patient: Home Provider: Office   I discussed the limitations of evaluation and management by telemedicine and the availability of in person appointments. The patient expressed understanding and agreed to proceed.  History of Present Illness:  Leonard Berg is a 87 year old male with a history of hyeprtension, CAD, paroxysmal atrial fibrillation, GI bleed, osteoarthritis, type 2 diabetes, BPH, renal failure, chronic constipation who presents today with a chief complaint of urine discoloration. His son is with him today who is providing information for HPI.  He is currently quarantined at home as his wife was diagnosed with Covid-19 a few days ago. He saw dark blood yesterday morning when urinating right after waking. He continued to notice discoloration throughout the day which became lighter as the day progressed. Today he's not notied any blood or discoloration to the urine. Yesterday he did notice lower back pain which has since resolved.   He denies dysuria, nausea, fevers. He drinks little water, has been increasing water intake recently.    Observations/Objective:  Alert and oriented. Appears well, not sickly. No distress. Speaking in complete sentences.   Assessment and Plan:  Acute discoloration to urine x 24 hours, nearly resolved now. Differentials include hematuria, UTI, dehydration. He does have a history of CKD. He doesn't seem to be experiencing renal stones. He is on Xarelto.  Check UA today. Add culture to rule out infection. Consider adding urine micro if blood is present. Symptoms have nearly resolved which is reassuring.    His son will drop off his urine test today.   Follow Up Instructions:  We will be in touch once we receive your urine test results.  Increase your water intake to at least 3 bottles daily.  It was a pleasure to see you today!    I discussed the assessment and treatment plan with the patient. The patient was provided an opportunity to ask questions and all were answered. The patient agreed with the plan and demonstrated an understanding of the instructions.   The patient was advised to call back or seek an in-person evaluation if the symptoms worsen or if the condition fails to improve as anticipated.    [name], NP    Review of Systems  Constitutional: Negative for fever.  Respiratory: Negative for cough.   Genitourinary: Negative for difficulty urinating, flank pain and frequency.       Discoloration to urine  Musculoskeletal: Negative for back pain.       Past Medical History:  Diagnosis Date  . Anemia   . Anxiety   . Arrhythmia    PAROXYSMAL ATRIAL FIBRILLATION  . Arthritis    OSTEOARTHRITIS  . Cataracts, bilateral   . Chronic back pain   . Coronary artery disease 8/11   CABG...LM EMERGENT WITH IABP sees Dr. Legrand Como cooper  . DJD (degenerative joint disease)   . DVT (deep venous thrombosis) ([place])   . Dyspnea   . GERD (gastroesophageal reflux disease)   . History of BPH   . Hyperlipidemia   . Hypertension   . Hypothyroidism   . Iron deficiency anemia due to chronic  blood loss 06/29/2019  . Lumbar post-laminectomy syndrome   . Myocardial infarction (HCC)   . Pulmonary embolism (HCC) 2009   hx of  . RBBB (right bundle branch block)   . Renal artery stenosis (HCC)   . Thrombocytopenia (HCC)   . Thyroid disease    HYPOTHYROIDISM  . Type 2 diabetes mellitus without complication, without long-term current use of insulin (HCC) 07/28/2019  . Unstable angina (HCC)    NONE IN LONG TIME     Social History   Socioeconomic History  . Marital  status: Married    Spouse name: Not on file  . Number of children: 2  . Years of education: Not on file  . Highest education level: Not on file  Occupational History  . 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 Smoking status: Former Smoker    Types: Cigarettes    Quit date: 04/13/1974    Years since quitting: 46.0  . Smokeless tobacco: Former NeurosurgeonUser    Types: Chew    Quit date: 01/26/2002  . Tobacco comment: He has smoked about 27-pack-year hx   Vaping Use  . Vaping Use: Never used  Substance and Sexual Activity  . Alcohol use: Not Currently    Alcohol/week: 2.0 standard drinks    Types: 2 Cans of beer per week    Comment: He used to drink more heavily , but rarely drinks at the current time  . Drug use: No  . Sexual activity: Not on file  Other Topics Concern  . Not on file  Social History Narrative   LIVES IN [place] WITH HIS WIFE.   HE HAS 2 GROWN CHILDREN   HE IS RETIRED PRISON FIRM SUPERINTENDENT.   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   HE GOES TO THE [employer] 3 X WEEKLY.   Social Determinants of Health   Financial Resource Strain: Low Risk   . Difficulty of Paying Living Expenses: Not hard at all  Food Insecurity: No Food Insecurity  . Worried About Programme researcher, broadcasting/film/videounning Out of Food in the Last Year: Never true  . Ran Out of Food in the Last Year: Never true  Transportation Needs: No Transportation Needs  . Lack of Transportation (Medical): No  . Lack of Transportation (Non-Medical): No  Physical Activity: Inactive  . Days of Exercise per Week: 0 days  . Minutes of Exercise per Session: 0 min  Stress: No Stress Concern Present  . Feeling of Stress : Not at all  Social Connections: Not on file  Intimate Partner Violence: Not At Risk  . Fear of Current or Ex-Partner: No  . Emotionally Abused: No  . Physically Abused: No  . Sexually Abused: No    Past  Surgical History:  Procedure Laterality Date  . Blood clot  Feb.20,  2016   Pulmonary Embolism  . CARDIOVERSION N/A 03/15/2019   Procedure: CARDIOVERSION;  Surgeon: Chrystie NoseHilty, Kenneth C, MD;  [facility];  Service: Cardiovascular;  Laterality: N/A;  . CAROTID ENDARTERECTOMY Right Oct. 17, 2013   CE  . CAROTID ENDARTERECTOMY Left Dec. 3, 2016   CE  . CATARACT EXTRACTION  05/2014,06/2014  . COLONOSCOPY WITH PROPOFOL N/A 05/20/2019   Procedure: COLONOSCOPY WITH PROPOFOL;  Surgeon: Meridee ScoreMansouraty, Netty StarringGabriel Jr., MD;  [facility];  Service: Gastroenterology;  Laterality: N/A;  . CORONARY ARTERY BYPASS GRAFT  11-21-09  EMERGENT X 3 GRAFTING. ..[name], MD, cc: [name]  . ENDARTERECTOMY  01/28/2012   Procedure: ENDARTERECTOMY CAROTID;  Surgeon: [name], MD;  Location: [place];  Service: Vascular;  Laterality: Right;  with Primary Closure of Artery  . ENDARTERECTOMY  03/15/2012   Procedure: ENDARTERECTOMY CAROTID;  Surgeon: [name], MD;  Location: [place];  Service: Vascular;  Laterality: Left;  . ENTEROSCOPY N/A 05/23/2019   Procedure: ENTEROSCOPY;  Surgeon: [name], MD;  Location: [place];  Service: Endoscopy;  Laterality: N/A;  . ESOPHAGOGASTRODUODENOSCOPY N/A 05/20/2019   Procedure: ESOPHAGOGASTRODUODENOSCOPY (EGD);  Surgeon: [name]., MD;  Location: [place];  Service: Gastroenterology;  Laterality: N/A;  . ESOPHAGOGASTRODUODENOSCOPY (EGD) WITH PROPOFOL N/A 05/19/2019   Procedure: ESOPHAGOGASTRODUODENOSCOPY (EGD) WITH PROPOFOL;  Surgeon: [name], MD;  Location: [place];  Service: Endoscopy;  Laterality: N/A;  . FOOT SURGERY    . GIVENS CAPSULE STUDY N/A 05/20/2019   Procedure: GIVENS CAPSULE STUDY;  Surgeon: [name]., MD;  Location: [place];  Service: Gastroenterology;  Laterality: N/A;  . HOT HEMOSTASIS N/A 05/23/2019   Procedure: HOT HEMOSTASIS (ARGON PLASMA COAGULATION/BICAP);  Surgeon:  [name], MD;  Location: [place];  Service: Endoscopy;  Laterality: N/A;  . MANDIBLE SURGERY    . MAXIMUM ACCESS (MAS)POSTERIOR LUMBAR INTERBODY FUSION (PLIF) 2 LEVEL N/A 08/08/2015   Procedure: Lumbar Four-Five, Lumbar Five-Sacral One Maximum access posterior lumbar interbody fusion;  Surgeon: [name], MD;  Location: [place] [facility];  Service: Neurosurgery;  Laterality: N/A;  LUMBAR FOUR-FIVE ,LUMBAR FIVE -SACRAL Maximum access posterior lumbar interbody fusion  . POLYPECTOMY  05/20/2019   Procedure: POLYPECTOMY;  Surgeon: [name], Telford Nab., MD;  Location: [facility] ENDOSCOPY;  Service: Gastroenterology;;  . RENAL ARTERY STENT  2010  . REPLACEMENT TOTAL KNEE  2006   RIGHT  . SPINAL CORD STIMULATOR INSERTION N/A 06/18/2017   Procedure: LUMBAR SPINAL CORD STIMULATOR INSERTION;  Surgeon: [name], MD;  Location: [place];  Service: Neurosurgery;  Laterality: N/A;  LUMBAR SPINAL CORD STIMULATOR INSERTION  . TOTAL HIP ARTHROPLASTY     right    Family History  Problem Relation Age of Onset  . Lung cancer Father 31       deceased   . Cancer Father 69       LUNG  . Stroke Mother 13       DECEASED   . Hyperlipidemia Mother   . Heart attack Brother 19       deceased  . Diabetes Brother   . Liver cancer Sister 71       deceased   . Cancer Sister     No Known Allergies  Current Outpatient Medications on File Prior to Visit  Medication Sig Dispense Refill  . amiodarone (PACERONE) 200 MG tablet TAKE 1 TABLET BY MOUTH DAILY. Please make yearly appt with Dr. Burt Knack for January 2022 for future refills. Thank you 1st attempt 30 tablet 1  . cetirizine (ZYRTEC) 10 MG tablet TAKE 1 TABLET BY MOUTH ONCE DAILY 30 tablet 5  . docusate sodium (COLACE) 100 MG capsule Take 2 capsules (200 mg total) by mouth daily. 10 capsule 0  . furosemide (LASIX) 40 MG tablet Take 1 tablet (40 mg total) by mouth daily. (Patient taking differently: Take 40 mg by mouth 2 (two) times daily.) 90 tablet 1  .  guaiFENesin (MUCINEX) 600 MG 12 hr tablet Take 600 mg by mouth 2 (two) times daily as needed for cough or to loosen  phlegm.    Marland Kitchen levothyroxine (SYNTHROID) 100 MCG tablet TAKE 1 TAB BY MOUTH ONCE DAILY. TAKE ON AN EMPTY STOMACH WITH A GLASS OF WATER ATLEAST 30-60 MINUTES BEFORE BREAKFAST 90 tablet 0  . LINZESS 145 MCG CAPS capsule Take 1 capsule (145 mcg total) by mouth daily before breakfast. 90 capsule 0  . lubiprostone (AMITIZA) 24 MCG capsule     . metoprolol succinate (TOPROL XL) 25 MG 24 hr tablet Take 1 tablet (25 mg total) by mouth daily. 90 tablet 3  . mometasone (NASONEX) 50 MCG/ACT nasal spray Place 2 sprays into the nose daily as needed (for allergies).    . Multiple Vitamin (MULTIVITAMIN WITH MINERALS) TABS tablet Take 1 tablet by mouth daily.    . rosuvastatin (CRESTOR) 10 MG tablet TAKE ONE TABLET BY MOUTH IN THE EVENING FOR CHOLESTEROL. 90 tablet 1  . tamsulosin (FLOMAX) 0.4 MG CAPS capsule TAKE 1 CAPSULE BY MOUTH ONCE DAILY 90 capsule 1  . triamcinolone cream (KENALOG) 0.1 % Apply 1 application topically 2 (two) times daily as needed (affected area(s) of ankle.).    Marland Kitchen vitamin B-12 (CYANOCOBALAMIN) 1000 MCG tablet TAKE 1 TABLET BY MOUTH ONCE DAILY 90 tablet 1  . Rivaroxaban (XARELTO) 15 MG TABS tablet Take 1 tablet (15 mg total) by mouth daily with supper. 90 tablet 3   No current facility-administered medications on file prior to visit.    Ht 6' (1.829 m)   Wt 231 lb (104.8 kg)   BMI 31.33 kg/m    Objective:   Physical Exam Constitutional:      General: He is not in acute distress.    Appearance: He is not ill-appearing.  Pulmonary:     Effort: Pulmonary effort is normal.  Neurological:     Mental Status: He is alert and oriented to person, place, and time.  Psychiatric:        Mood and Affect: Mood normal.            Assessment & Plan:

## 2020-04-23 LAB — URINE CULTURE
MICRO NUMBER:: 11399413
SPECIMEN QUALITY:: ADEQUATE

## 2020-04-24 IMAGING — DX DG CHEST 2V
2 series · 2 of 2 positions shown · non-contrast
Comparison: 2549

CLINICAL DATA: Chest pain

EXAM:
CHEST - 2 VIEW

[chest pa]
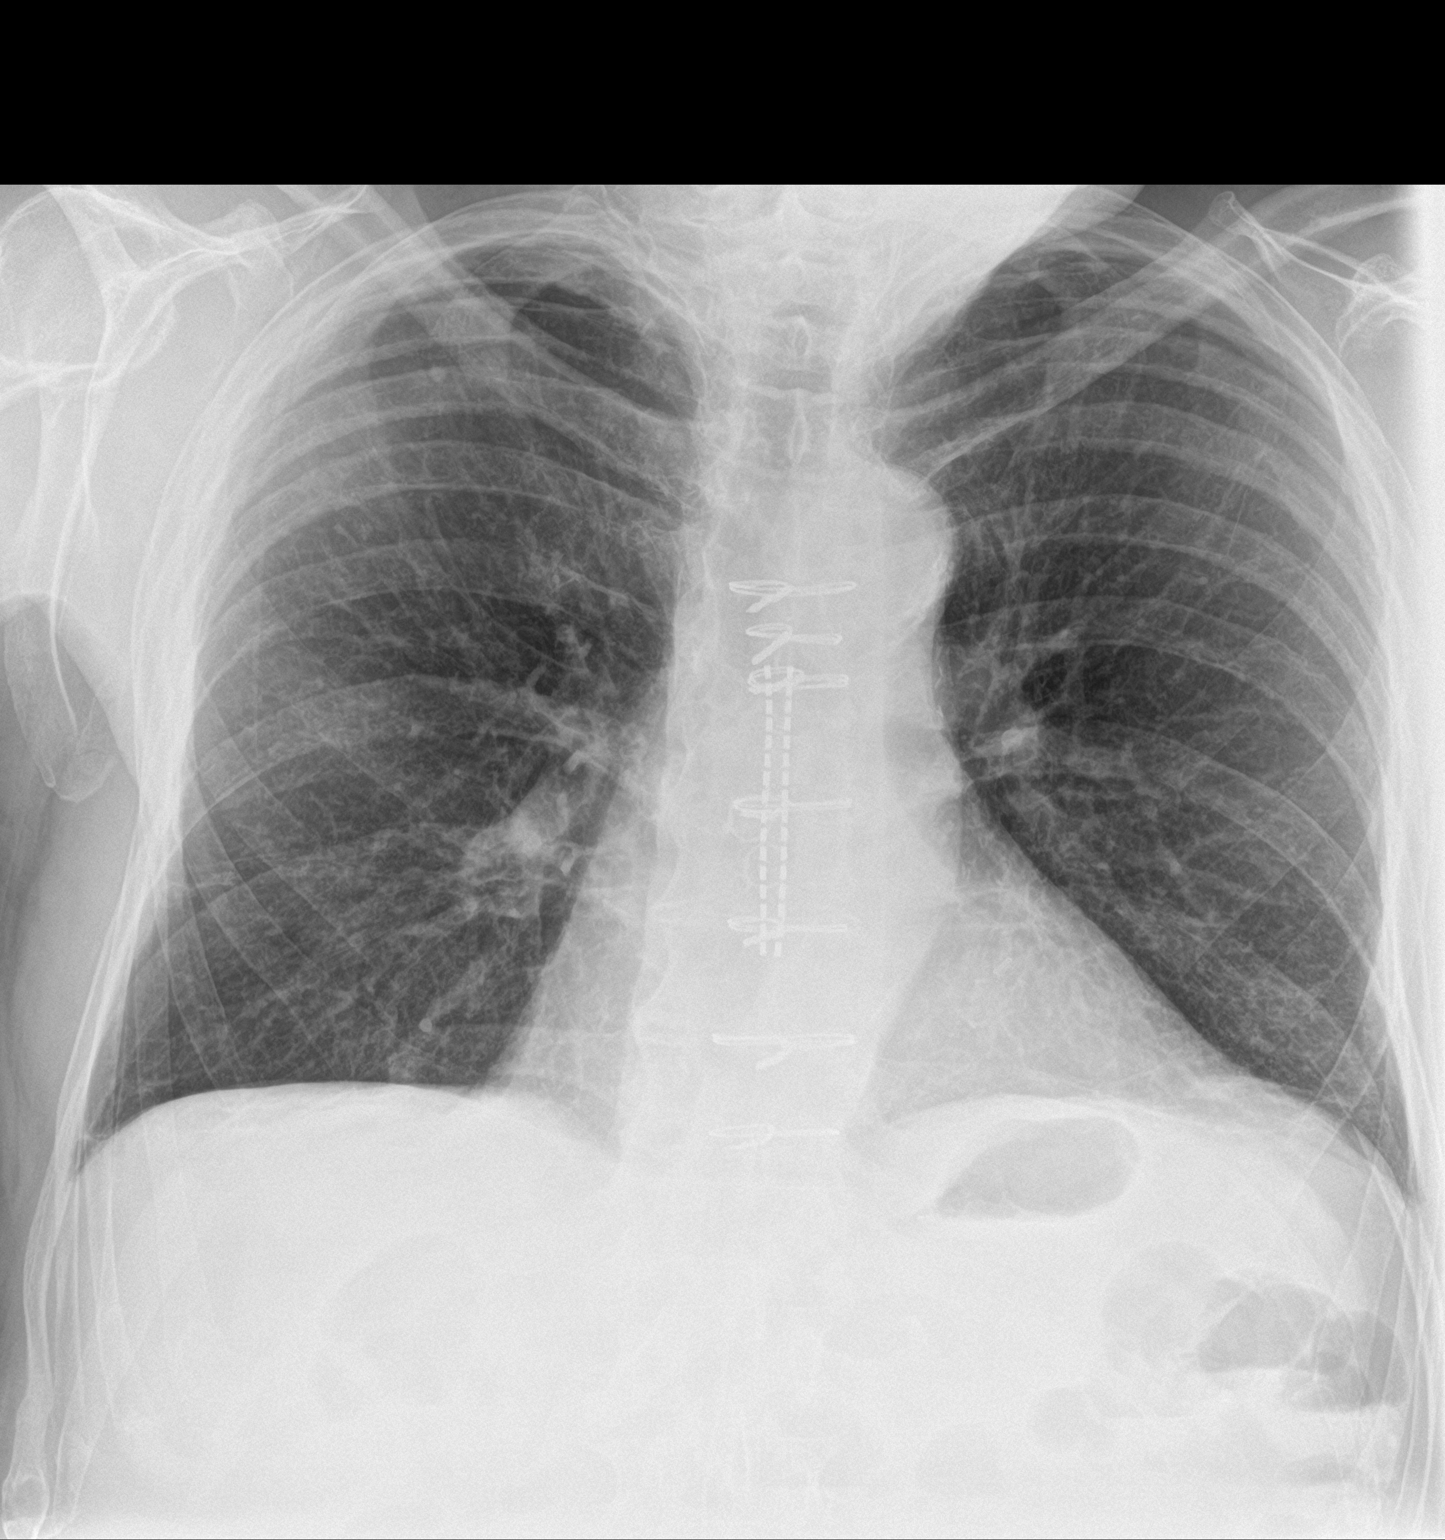

[chest lat]
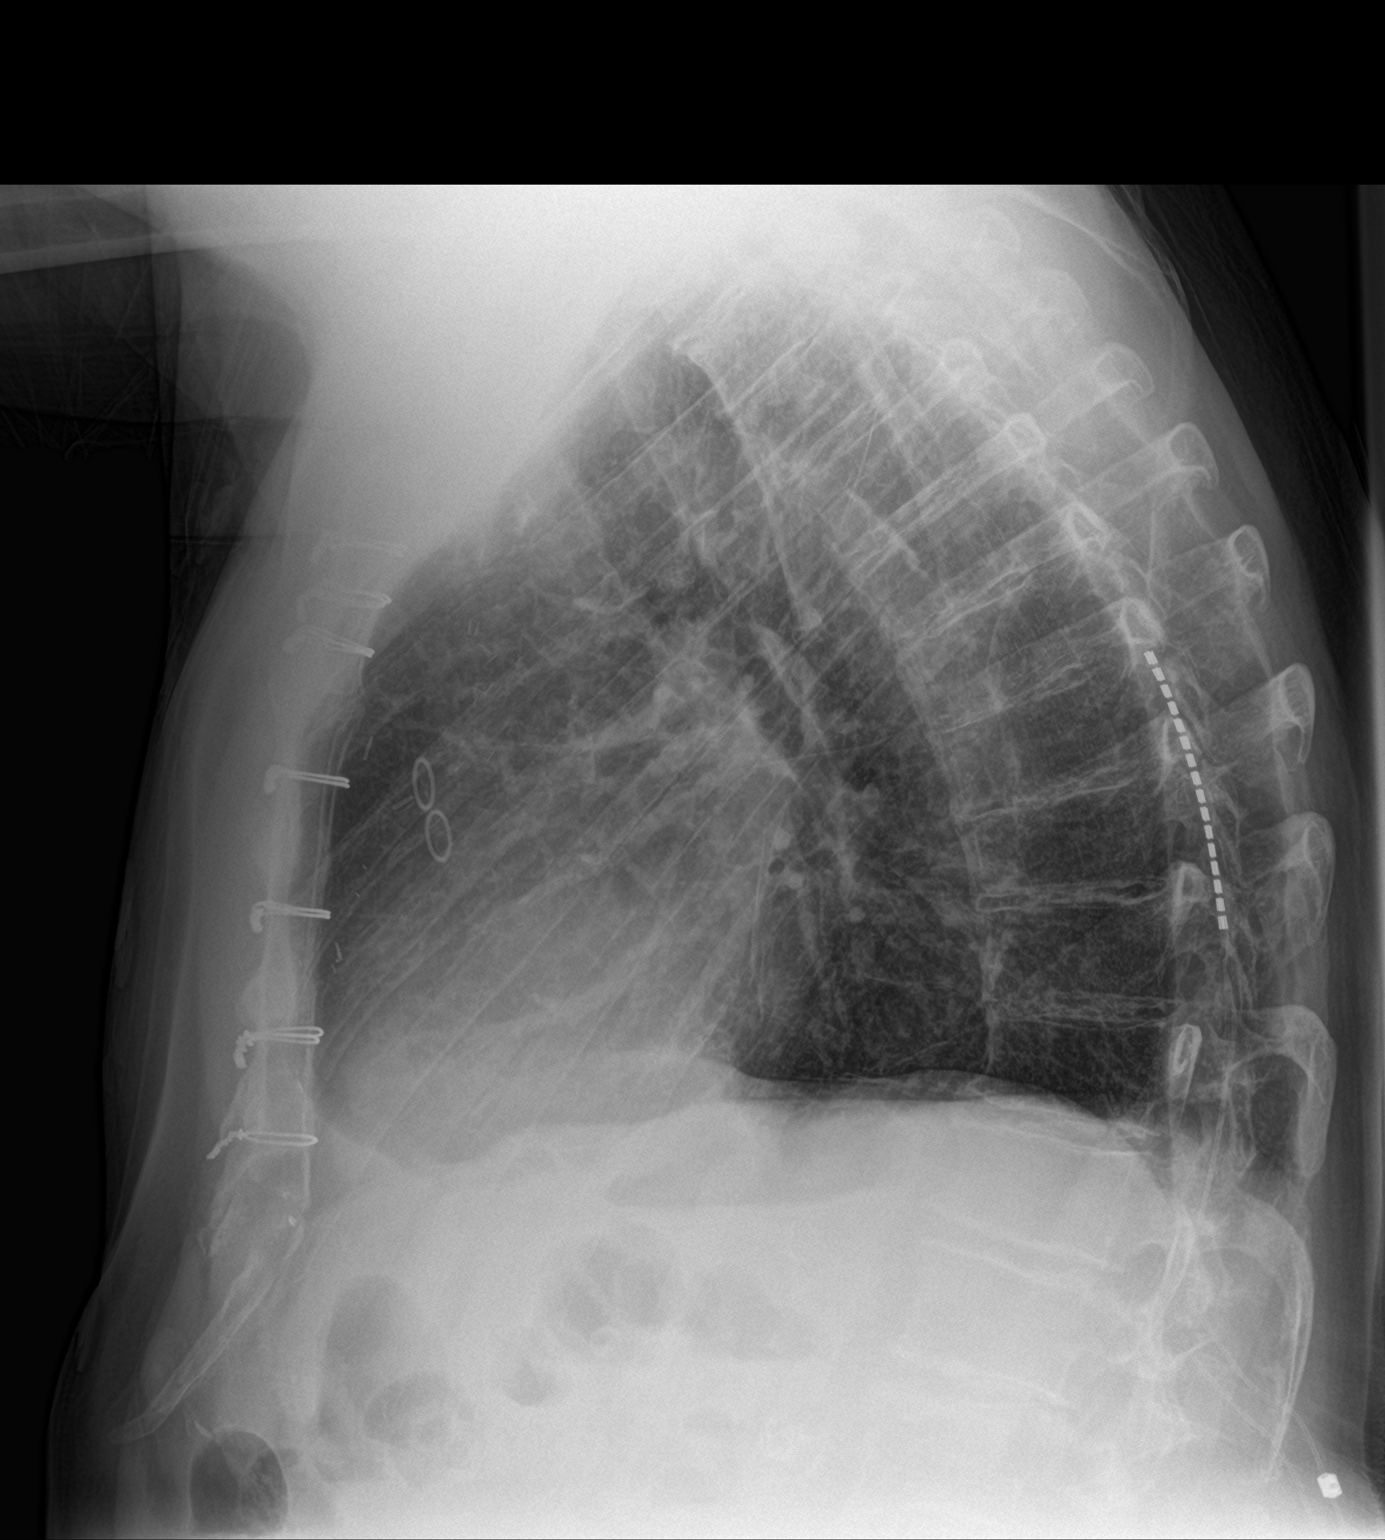

[2 of 2 positions shown; findings below may reference images not displayed]

FINDINGS: No new consolidation or edema. Mild scarring at the left lung base.
No pleural effusion or pneumothorax. Stable cardiomediastinal
contours. Interval placement of spinal stimulator.
IMPRESSION: No acute process in the chest.

## 2020-04-30 ENCOUNTER — Other Ambulatory Visit: Payer: Self-pay | Admitting: Primary Care

## 2020-04-30 DIAGNOSIS — N400 Enlarged prostate without lower urinary tract symptoms: Secondary | ICD-10-CM

## 2020-05-02 NOTE — Telephone Encounter (Signed)
Ok to refill 

## 2020-05-02 NOTE — Telephone Encounter (Signed)
Refills sent to pharmacy. 

## 2020-05-10 ENCOUNTER — Other Ambulatory Visit: Payer: Self-pay

## 2020-05-10 ENCOUNTER — Other Ambulatory Visit (INDEPENDENT_AMBULATORY_CARE_PROVIDER_SITE_OTHER): Payer: Medicare Other

## 2020-05-10 DIAGNOSIS — E038 Other specified hypothyroidism: Secondary | ICD-10-CM

## 2020-05-10 LAB — TSH: TSH: 8.25 u[IU]/mL — ABNORMAL HIGH (ref 0.35–4.50)

## 2020-05-11 ENCOUNTER — Other Ambulatory Visit: Payer: Self-pay | Admitting: Oncology

## 2020-05-11 ENCOUNTER — Other Ambulatory Visit: Payer: Self-pay | Admitting: Primary Care

## 2020-05-11 ENCOUNTER — Other Ambulatory Visit: Payer: Self-pay | Admitting: Physician Assistant

## 2020-05-11 DIAGNOSIS — E785 Hyperlipidemia, unspecified: Secondary | ICD-10-CM

## 2020-05-13 ENCOUNTER — Other Ambulatory Visit: Payer: Medicare Other

## 2020-05-14 DIAGNOSIS — E038 Other specified hypothyroidism: Secondary | ICD-10-CM

## 2020-05-15 MED ORDER — LEVOTHYROXINE SODIUM 100 MCG PO TABS: ORAL_TABLET | ORAL | 0 refills | Status: DC

## 2020-05-20 ENCOUNTER — Telehealth: Payer: Self-pay | Admitting: Cardiovascular Disease

## 2020-05-20 NOTE — Telephone Encounter (Signed)
STAT if HR is under 50 or over 120 (normal HR is 60-100 beats per minute)  1) What is your heart rate? 48  2) Do you have a log of your heart rate readings (document readings)? 48, 54   3) Do you have any other symptoms? Asymptomatic.   Home Health Nurse did a visit with the patient today. Patient was not having sx but HHN wanted to make the office aware of the low HR. Patient is scheduled for f/u visit soon

## 2020-05-20 NOTE — Telephone Encounter (Signed)
Leonard Berg is calling back requesting the patient be worked in for a sooner appt when calling back due to this. Please advise.

## 2020-05-21 NOTE — Telephone Encounter (Signed)
Please hold metoprolol until seen by Sharee Pimple. thx

## 2020-05-21 NOTE — Telephone Encounter (Signed)
Spoke with the patient's son, Leonard Berg ([place]), who states the patient's [facility] RN is concerned due to low heart rate (48-54). DPR states the patient feels fine and denies dizziness, lightheadedness, fatigue, and fainting.  He does not recall BP readings.  The patient is taking his medications as directed.   Scheduled the patient for overdue visit with Kathyrn Drown next week.  Informed the patient's son he'd be called if there are recommendations prior to that visit.  Attempted to call [facility] RN to obtain more vital signs but was on hold for several minutes with no option to leave message.

## 2020-05-21 NOTE — Telephone Encounter (Signed)
Instructed DPR to have patient STOP METOPROLOL.  Medications will be readdressed at [facility] visit next week. DPR and the patient were grateful for call and agrees with plan.

## 2020-05-26 NOTE — Progress Notes (Deleted)
Cardiology Office Note   Date:  05/26/2020   ID:  Leonard [name], DOB 12-20-1929, MRN [id number]  PCP:  [name], NP  Cardiologist:  Dr. Burt Knack, MD   No chief complaint on file.     History of Present Illness: Leonard CASIMIR Sr. is a 87 y.o. male who presents for follow-up of bradycardia, seen for Dr. Burt Knack.   Leonard Berg has a history of PAF, CAD s/p CABG 2011, PAD with renal artery stenting in 2012, carotid disease with history of bilateral carotid endarterectomies in 2013, recurrent pulmonary emboli on long-term oral anticoagulation with Xarelto, HLD and thyroid disease seen today for follow-up.  Patient was last seen by Dr. Burt Knack 02/17/2019 for fatigue found to have new onset atrial fibrillation.  Toprol was increased to 50 mg daily.  Echocardiogram 02/28/2019 showed LVEF at 30 to 35% with moderate diastolic dysfunction, and mildly reduced RV function.  Patient was then seen in follow-up at which time amiodarone was added to his regimen.  He underwent successful DCCV with a single 150 J biphasic shock 03/15/2019 to NSR.  He was last seen in follow-up 03/31/2019 at which time he was maintaining NSR with improved energy.  He was tolerating Xarelto without any acute bleeding.  He was continued on Toprol-XL and amiodarone 200 daily.  Lisinopril was added to his regimen due to chronic combined CHF.  He was continued on Lasix.  Patient then called the office 03/19/2021 with reports of bradycardia with HR 48 bpm however patient was asymptomatic.  This was found by home health RN.  Plan was to hold metoprolol with close cardiology follow-up.  He is here today  1. Bradycardia:    1. Paroxysmal atrial fibrillation with slow ventricular rate - S/p succesful cardioversion 12/1.  Maintaining sinus rhythm at rate of 50.  He has not noted much difference after cardioversion.  This could be due to slow heart rate.  Also deconditioning.  Will reduce Toprol-XL to 25 mg  daily.  Continue amiodarone 200 mg daily.  Compliant with Leonard Berg. No bleeding issue.   2. Chronic combined CHF -Euvolemic.  Likely due to unknown duration of afib. Echo 02/28/2019 showed LVEF of 30-35% with moderate diastolic dysfunction. Mildly reduce RV function. Normal LA size.If no improvement of his symptoms after restoration of sinus rhythm >> may consider ischemic evaluation. -Reduce beta-blocker as above.  Add lisinopril. BMET in 2 weeks.  - Continue Lasix.   3. HTN -Medication changes as above. -Reduce Toprol-XL to 25 mg, add lisinopril 5 mg.  Stop amlodipine  4. CAD - No chest pain. Continue ASA, statin and BB.  5. Recurrent pulmonary emboli(on long-term oral anticoagulation with rivaroxaban) - compliant      Past Medical History:  Diagnosis Date  . Anemia   . Anxiety   . Arrhythmia    PAROXYSMAL ATRIAL FIBRILLATION  . Arthritis    OSTEOARTHRITIS  . Cataracts, bilateral   . Chronic back pain   . Coronary artery disease 8/11   CABG...LM EMERGENT WITH IABP sees Dr. Legrand Como cooper  . DJD (degenerative joint disease)   . DVT (deep venous thrombosis) ([place])   . Dyspnea   . GERD (gastroesophageal reflux disease)   . History of BPH   . Hyperlipidemia   . Hypertension   . Hypothyroidism   . Iron deficiency anemia due to chronic blood loss 06/29/2019  . Lumbar post-laminectomy syndrome   . Myocardial infarction ([place])   . Pulmonary embolism ([place]) 2009  hx of  . RBBB (right bundle branch block)   . Renal artery stenosis ([place])   . Thrombocytopenia ([place])   . Thyroid disease    HYPOTHYROIDISM  . Type 2 diabetes mellitus without complication, without long-term current use of insulin ([place]) 07/28/2019  . Unstable angina ([place])    NONE IN LONG TIME    Past Surgical History:  Procedure Laterality Date  . Blood clot  Feb.20,  2016   Pulmonary Embolism  . CARDIOVERSION N/A 03/15/2019   Procedure: CARDIOVERSION;  Surgeon: Pixie Casino, MD;  Location: [facility]  ENDOSCOPY;  Service: Cardiovascular;  Laterality: N/A;  . CAROTID ENDARTERECTOMY Right Oct. 17, 2013   CE  . CAROTID ENDARTERECTOMY Left Dec. 3, 2016   CE  . CATARACT EXTRACTION  05/2014,06/2014  . COLONOSCOPY WITH PROPOFOL N/A 05/20/2019   Procedure: COLONOSCOPY WITH PROPOFOL;  Surgeon: Rush Landmark Telford Nab., MD;  Location: [place];  Service: Gastroenterology;  Laterality: N/A;  . CORONARY ARTERY BYPASS GRAFT  11-21-09   EMERGENT X 3 GRAFTING. ..[name], MD, cc: [name]  . ENDARTERECTOMY  01/28/2012   Procedure: ENDARTERECTOMY CAROTID;  Surgeon: [name], MD;  Location: [place];  Service: Vascular;  Laterality: Right;  with Primary Closure of Artery  . ENDARTERECTOMY  03/15/2012   Procedure: ENDARTERECTOMY CAROTID;  Surgeon: [name], MD;  Location: [place];  Service: Vascular;  Laterality: Left;  . ENTEROSCOPY N/A 05/23/2019   Procedure: ENTEROSCOPY;  Surgeon: [name], MD;  Location: [place];  Service: Endoscopy;  Laterality: N/A;  . ESOPHAGOGASTRODUODENOSCOPY N/A 05/20/2019   Procedure: ESOPHAGOGASTRODUODENOSCOPY (EGD);  Surgeon: [name]., MD;  Location: [place];  Service: Gastroenterology;  Laterality: N/A;  . ESOPHAGOGASTRODUODENOSCOPY (EGD) WITH PROPOFOL N/A 05/19/2019   Procedure: ESOPHAGOGASTRODUODENOSCOPY (EGD) WITH PROPOFOL;  Surgeon: [name], MD;  Location: [place];  Service: Endoscopy;  Laterality: N/A;  . FOOT SURGERY    . GIVENS CAPSULE STUDY N/A 05/20/2019   Procedure: GIVENS CAPSULE STUDY;  Surgeon: [name]., MD;  Location: [place];  Service: Gastroenterology;  Laterality: N/A;  . HOT HEMOSTASIS N/A 05/23/2019   Procedure: HOT HEMOSTASIS (ARGON PLASMA COAGULATION/BICAP);  Surgeon: [name], MD;  Location: [place];  Service: Endoscopy;  Laterality: N/A;  . MANDIBLE SURGERY    . MAXIMUM ACCESS (MAS)POSTERIOR LUMBAR INTERBODY FUSION (PLIF) 2 LEVEL N/A 08/08/2015   Procedure:  Lumbar Four-Five, Lumbar Five-Sacral One Maximum access posterior lumbar interbody fusion;  Surgeon: [name], MD;  Location: [place] NEURO ORS;  Service: Neurosurgery;  Laterality: N/A;  LUMBAR FOUR-FIVE ,LUMBAR FIVE -SACRAL Maximum access posterior lumbar interbody fusion  . POLYPECTOMY  05/20/2019   Procedure: POLYPECTOMY;  Surgeon: [name], Telford Nab., MD;  [facility] END[facility]COPY;  Service: Gastroenterology;;  . RENAL ARTERY STENT  2010  . REPLACEMENT TOTAL KNEE  2006   RIGHT  . SPINAL CORD STIMULATOR INSERTION N/A 06/18/2017   Procedure: LUMBAR SPINAL CORD STIMULATOR INSERTION;  Surgeon: [name], MD;  Location: [place];  Service: Neurosurgery;  Laterality: N/A;  LUMBAR SPINAL CORD STIMULATOR INSERTION  . TOTAL HIP ARTHROPLASTY     right     Current Outpatient Medications  Medication Sig Dispense Refill  . amiodarone (PACERONE) 200 MG tablet TAKE 1 TABLET BY MOUTH DAILY. Please make yearly appt with Dr. Burt Knack for January 2022 for future refills. Thank you 1st attempt 30 tablet 1  . cetirizine (ZYRTEC) 10 MG tablet TAKE 1 TABLET BY MOUTH ONCE DAILY 30 tablet 5  . docusate sodium (  COLACE) 100 MG capsule Take 2 capsules (200 mg total) by mouth daily. 10 capsule 0  . furosemide (LASIX) 40 MG tablet Take 1 tablet (40 mg total) by mouth daily. (Patient taking differently: Take 40 mg by mouth 2 (two) times daily.) 90 tablet 1  . guaiFENesin (MUCINEX) 600 MG 12 hr tablet Take 600 mg by mouth 2 (two) times daily as needed for cough or to loosen phlegm.    Marland Kitchen levothyroxine (SYNTHROID) 100 MCG tablet Take 1 tablet by mouth on empty stomach with water only on Sunday through Friday.  Take 1 and 1/2 tablet on Saturday only. No food or other medicine for 30 minutes. 94 tablet 0  . LINZESS 145 MCG CAPS capsule Take 1 capsule (145 mcg total) by mouth daily before breakfast. 90 capsule 0  . lubiprostone (AMITIZA) 24 MCG capsule     . mometasone (NASONEX) 50 MCG/ACT nasal spray Place 2 sprays into the  nose daily as needed (for allergies).    . Multiple Vitamin (MULTIVITAMIN WITH MINERALS) TABS tablet Take 1 tablet by mouth daily.    . Rivaroxaban (XARELTO) 15 MG TABS tablet Take 1 tablet (15 mg total) by mouth daily with supper. 90 tablet 3  . rosuvastatin (CRESTOR) 10 MG tablet TAKE ONE TABLET BY MOUTH IN THE EVENING FOR CHOLESTEROL. 90 tablet 0  . tamsulosin (FLOMAX) 0.4 MG CAPS capsule Take 1 capsule (0.4 mg total) by mouth daily. For urine flow. 90 capsule 1  . triamcinolone cream (KENALOG) 0.1 % Apply 1 application topically 2 (two) times daily as needed (affected area(s) of ankle.).    Marland Kitchen vitamin B-12 (CYANOCOBALAMIN) 1000 MCG tablet TAKE 1 TABLET BY MOUTH ONCE DAILY 90 tablet 1   No current facility-administered medications for this visit.    Allergies:   Patient has no known allergies.    Social History:  The patient  reports that he quit smoking about 46 years ago. His smoking use included cigarettes. He quit smokeless tobacco use about 18 years ago.  His smokeless tobacco use included chew. He reports previous alcohol use of about 2.0 standard drinks of alcohol per week. He reports that he does not use drugs.   Family History:  The patient's ***family history includes Cancer in his sister; Cancer (age of onset: 1) in his father; Diabetes in his brother; Heart attack (age of onset: 58) in his brother; Hyperlipidemia in his mother; Liver cancer (age of onset: 54) in his sister; Lung cancer (age of onset: 30) in his father; Stroke (age of onset: 28) in his mother.    ROS:  Please see the history of present illness.   Otherwise, review of systems are positive for {NONE DEFAULTED:18576::"none"}.   All other systems are reviewed and negative.    PHYSICAL EXAM: VS:  There were no vitals taken for this visit. , BMI There is no height or weight on file to calculate BMI. GEN: Well nourished, well developed, in no acute distress HEENT: normal Neck: no JVD, carotid bruits, or  masses Cardiac: ***RRR; no murmurs, rubs, or gallops,no edema  Respiratory:  clear to auscultation bilaterally, normal work of breathing GI: soft, nontender, nondistended, + BS MS: no deformity or atrophy Skin: warm and dry, no rash Neuro:  Strength and sensation are intact Psych: euthymic mood, full affect   EKG:  EKG {ACTION; IS/IS [id number]} ordered today. The ekg ordered today demonstrates ***   Recent Labs: 01/31/2020: BUN 20; Creatinine 1.7; Hemoglobin 14.6; Platelets 165; Potassium 4.0; Sodium 140 05/10/2020:  TSH 8.25    Lipid Panel    Component Value Date/Time   CHOL 129 07/28/2019 0844   TRIG 93.0 07/28/2019 0844   HDL 40.50 07/28/2019 0844   CHOLHDL 3 07/28/2019 0844   VLDL 18.6 07/28/2019 0844   LDLCALC 70 07/28/2019 0844   LDLDIRECT 100.8 08/12/2010 1523      Wt Readings from Last 3 Encounters:  04/22/20 231 lb (104.8 kg)  03/11/20 231 lb (104.8 kg)  11/29/19 224 lb 8 oz (101.8 kg)      Other studies Reviewed: Additional studies/ records that were reviewed today include: ***. Review of the above records demonstrates: ***   ASSESSMENT AND PLAN:  1.  ***   Current medicines are reviewed at length with the patient today.  The patient {ACTIONS; HAS/DOES NOT HAVE:19233} concerns regarding medicines.  The following changes have been made:  {PLAN; NO CHANGE:13088:s}  Labs/ tests ordered today include: *** No orders of the defined types were placed in this encounter.    Disposition:   FU with *** in {gen number 3-81:840375} {Days to years:10300}  Signed, Kathyrn Drown, NP  05/26/2020 7:07 AM    [facility] [place], [place], [place]  [address] Phone: [phone]; Fax: [phone]

## 2020-05-28 ENCOUNTER — Ambulatory Visit: Payer: Medicare Other | Admitting: Cardiology

## 2020-05-30 ENCOUNTER — Other Ambulatory Visit: Payer: Self-pay | Admitting: Primary Care

## 2020-05-30 ENCOUNTER — Other Ambulatory Visit: Payer: Self-pay | Admitting: Cardiovascular Disease

## 2020-05-30 DIAGNOSIS — J309 Allergic rhinitis, unspecified: Secondary | ICD-10-CM

## 2020-06-05 ENCOUNTER — Encounter: Payer: Self-pay | Admitting: Cardiovascular Disease

## 2020-06-05 ENCOUNTER — Ambulatory Visit: Payer: Medicare Other | Admitting: Cardiovascular Disease

## 2020-06-05 ENCOUNTER — Other Ambulatory Visit: Payer: Self-pay

## 2020-06-05 VITALS — BP 136/70 | HR 73 | Ht 73.0 in | Wt 226.6 lb

## 2020-06-05 DIAGNOSIS — I5022 Chronic systolic (congestive) heart failure: Secondary | ICD-10-CM

## 2020-06-05 DIAGNOSIS — I2510 Atherosclerotic heart disease of native coronary artery without angina pectoris: Secondary | ICD-10-CM | POA: Diagnosis not present

## 2020-06-05 DIAGNOSIS — I480 Paroxysmal atrial fibrillation: Secondary | ICD-10-CM | POA: Diagnosis not present

## 2020-06-05 DIAGNOSIS — I10 Essential (primary) hypertension: Secondary | ICD-10-CM

## 2020-06-05 NOTE — Patient Instructions (Signed)
Medication Instructions:  Your provider recommends that you continue on your current medications as directed. Please refer to the Current Medication list given to you today.   *If you need a refill on your cardiac medications before your next appointment, please call your pharmacy*  Testing/Procedures: Your provider has requested that you have an echocardiogram. Echocardiography is a painless test that uses sound waves to create images of your heart. It provides your doctor with information about the size and shape of your heart and how well your heart's chambers and valves are working. This procedure takes approximately one hour. There are no restrictions for this procedure.  Follow-Up: At [facility], you and your health needs are our priority.  As part of our continuing mission to provide you with exceptional heart care, we have created designated Provider Care Teams.  These Care Teams include your primary Cardiologist (physician) and Advanced Practice Providers (APPs -  Physician Assistants and Nurse Practitioners) who all work together to provide you with the care you need, when you need it. Your next appointment:   6 month(s) The format for your next appointment:   In Person Provider:   You will see one of the following Advanced Practice Providers on your designated Care Team:    Richardson Dopp, PA-C  [place], [place]

## 2020-06-05 NOTE — Progress Notes (Unsigned)
Cardiology Office Note:    Date:  06/06/2020   ID:  Leonard [name], DOB Jun 13, 1929, MRN [id number]  PCP:  [name], NP   [place]  Cardiologist:  [name], MD  Advanced Practice Provider:  No care team member to display Electrophysiologist:  None       Referring MD: [name], NP   Chief Complaint  Patient presents with  . Coronary Artery Disease   History of Present Illness:    Leonard BENNEY Sr. is a 87 y.o. male with a hx of coronary artery disease status post CABG in 2011, peripheral arterial disease, carotid stenosis with history of bilateral carotid endarterectomy, recurrent pulmonary emboli maintained on long-term anticoagulation, and paroxysmal atrial fibrillation.  The patient was last seen in our office May 03, 2019 for routine follow-up.  Shortly thereafter, he presented to the hospital with progressive weakness and was found to have GI blood loss anemia requiring blood cell transfusion.  He has stabilized since then and is doing okay.  His wife passed away in 05/10/2022 of this year after 21 years of marriage.  He states that she became sick and died within about a week.  He is not doing much anymore and is much less active than he was 3 or 4 years ago.  Feels like he is losing his energy.  He has no specific complaints of chest pain or shortness of breath with activity.  He denies orthopnea, PND, heart palpitations, or leg swelling.  Past Medical History:  Diagnosis Date  . Anemia   . Anxiety   . Arrhythmia    PAROXYSMAL ATRIAL FIBRILLATION  . Arthritis    OSTEOARTHRITIS  . Cataracts, bilateral   . Chronic back pain   . Coronary artery disease 8/11   CABG...LM EMERGENT WITH IABP sees Dr. Legrand Como cooper  . DJD (degenerative joint disease)   . DVT (deep venous thrombosis) ([place])   . Dyspnea   . GERD (gastroesophageal reflux disease)   . History of BPH   . Hyperlipidemia   . Hypertension   .  Hypothyroidism   . Iron deficiency anemia due to chronic blood loss 06/29/2019  . Lumbar post-laminectomy syndrome   . Myocardial infarction ([place])   . Pulmonary embolism ([place]) 2009   hx of  . RBBB (right bundle branch block)   . Renal artery stenosis ([place])   . Thrombocytopenia ([place])   . Thyroid disease    HYPOTHYROIDISM  . Type 2 diabetes mellitus without complication, without long-term current use of insulin ([place]) 07/28/2019  . Unstable angina ([place])    NONE IN LONG TIME    Past Surgical History:  Procedure Laterality Date  . Blood clot  Feb.20,  2016   Pulmonary Embolism  . CARDIOVERSION N/A 03/15/2019   Procedure: CARDIOVERSION;  Surgeon: Pixie Casino, MD;  Location[facility] ENDOSCOPY;  Service: Cardiovascular;  Laterality: N/A;  . CAROTID ENDARTERECTOMY Right Oct. 17, 2013   CE  . CAROTID ENDARTERECTOMY Left Dec. 3, 2016   CE  . CATARACT EXTRACTION  05/2014,06/2014  . COLONOSCOPY WITH PROPOFOL N/A 05/20/2019   Procedure: COLONOSCOPY WITH PROPOFOL;  Surgeon: Rush Landmark Telford Nab., MD;  Location: [place];  Service: Gastroenterology;  Laterality: N/A;  . CORONARY ARTERY BYPASS GRAFT  11-21-09   EMERGENT X 3 GRAFTING. ..[name], MD, cc: [name]  . ENDARTERECTOMY  01/28/2012   Procedure: ENDARTERECTOMY CAROTID;  Surgeon: [name], MD;  Location: [place];  Service: Vascular;  Laterality: Right;  with Primary Closure of Artery  . ENDARTERECTOMY  03/15/2012   Procedure: ENDARTERECTOMY CAROTID;  Surgeon: [name], MD;  Location: [place];  Service: Vascular;  Laterality: Left;  . ENTEROSCOPY N/A 05/23/2019   Procedure: ENTEROSCOPY;  Surgeon: [name], MD;  Location: [place];  Service: Endoscopy;  Laterality: N/A;  . ESOPHAGOGASTRODUODENOSCOPY N/A 05/20/2019   Procedure: ESOPHAGOGASTRODUODENOSCOPY (EGD);  Surgeon: [name]., MD;  Location: [place];  Service: Gastroenterology;  Laterality: N/A;  . ESOPHAGOGASTRODUODENOSCOPY  (EGD) WITH PROPOFOL N/A 05/19/2019   Procedure: ESOPHAGOGASTRODUODENOSCOPY (EGD) WITH PROPOFOL;  Surgeon: [name], MD;  Location: [place];  Service: Endoscopy;  Laterality: N/A;  . FOOT SURGERY    . GIVENS CAPSULE STUDY N/A 05/20/2019   Procedure: GIVENS CAPSULE STUDY;  Surgeon: [name]., MD;  Location: [place];  Service: Gastroenterology;  Laterality: N/A;  . HOT HEMOSTASIS N/A 05/23/2019   Procedure: HOT HEMOSTASIS (ARGON PLASMA COAGULATION/BICAP);  Surgeon: [name], MD;  Location: [place];  Service: Endoscopy;  Laterality: N/A;  . MANDIBLE SURGERY    . MAXIMUM ACCESS (MAS)POSTERIOR LUMBAR INTERBODY FUSION (PLIF) 2 LEVEL N/A 08/08/2015   Procedure: Lumbar Four-Five, Lumbar Five-Sacral One Maximum access posterior lumbar interbody fusion;  Surgeon: [name], MD;  Location: [place] NEURO ORS;  Service: Neurosurgery;  Laterality: N/A;  LUMBAR FOUR-FIVE ,LUMBAR FIVE -SACRAL Maximum access posterior lumbar interbody fusion  . POLYPECTOMY  05/20/2019   Procedure: POLYPECTOMY;  Surgeon: [name], Telford Nab., MD;  Location: [facility] ENDOSCOPY;  Service: Gastroenterology;;  . RENAL ARTERY STENT  2010  . REPLACEMENT TOTAL KNEE  2006   RIGHT  . SPINAL CORD STIMULATOR INSERTION N/A 06/18/2017   Procedure: LUMBAR SPINAL CORD STIMULATOR INSERTION;  Surgeon: [name], MD;  Location: [place];  Service: Neurosurgery;  Laterality: N/A;  LUMBAR SPINAL CORD STIMULATOR INSERTION  . TOTAL HIP ARTHROPLASTY     right    Current Medications: Current Meds  Medication Sig  . amiodarone (PACERONE) 200 MG tablet TAKE 1 TABLET BY MOUTH DAILY. Please make yearly appt with Dr. Burt Knack for January 2022 for future refills. Thank you 1st attempt  . cetirizine (ZYRTEC) 10 MG tablet Take 1 tablet (10 mg total) by mouth daily. For allergies.  Marland Kitchen docusate sodium (COLACE) 100 MG capsule Take 2 capsules (200 mg total) by mouth daily.  Marland Kitchen guaiFENesin (MUCINEX) 600 MG 12 hr tablet Take 600 mg by mouth 2  (two) times daily as needed for cough or to loosen phlegm.  Marland Kitchen levothyroxine (SYNTHROID) 100 MCG tablet Take 1 tablet by mouth on empty stomach with water only on Sunday through Friday.  Take 1 and 1/2 tablet on Saturday only. No food or other medicine for 30 minutes.  Marland Kitchen LINZESS 145 MCG CAPS capsule Take 1 capsule (145 mcg total) by mouth daily before breakfast.  . lubiprostone (AMITIZA) 24 MCG capsule   . mometasone (NASONEX) 50 MCG/ACT nasal spray Place 2 sprays into the nose daily as needed (for allergies).  . Multiple Vitamin (MULTIVITAMIN WITH MINERALS) TABS tablet Take 1 tablet by mouth daily.  . rosuvastatin (CRESTOR) 10 MG tablet TAKE ONE TABLET BY MOUTH IN THE EVENING FOR CHOLESTEROL.  Marland Kitchen tamsulosin (FLOMAX) 0.4 MG CAPS capsule Take 1 capsule (0.4 mg total) by mouth daily. For urine flow.  . triamcinolone cream (KENALOG) 0.1 % Apply 1 application topically 2 (two) times daily as needed (affected area(s) of ankle.).  Marland Kitchen vitamin B-12 (CYANOCOBALAMIN) 1000 MCG tablet TAKE 1 TABLET BY  MOUTH ONCE DAILY     Allergies:   Patient has no known allergies.   Social History   Socioeconomic History  . Marital status: Married    Spouse name: Not on file  . Number of children: 2  . Years of education: Not on file  . Highest education level: Not on file  Occupational History  . 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 Smoking status: Former Smoker    Types: Cigarettes    Quit date: 04/13/1974    Years since quitting: 46.1  . Smokeless tobacco: Former Systems developer    Types: Chew    Quit date: 01/26/2002  . Tobacco comment: He has smoked about 27-pack-year hx   Vaping Use  . Vaping Use: Never used  Substance and Sexual Activity  . Alcohol use: Not Currently    Alcohol/week: 2.0 standard drinks    Types: 2 Cans of beer per week    Comment: He used to drink more heavily , but rarely drinks at the current time  . Drug use:  No  . Sexual activity: Not on file  Other Topics Concern  . Not on file  Social History Narrative   LIVES IN [place] WITH HIS WIFE.   HE HAS 2 GROWN CHILDREN   HE IS RETIRED PRISON FIRM SUPERINTENDENT.   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   HE GOES TO THE [employer] 3 X WEEKLY.   Social Determinants of Health   Financial Resource Strain: Low Risk   . Difficulty of Paying Living Expenses: Not hard at all  Food Insecurity: No Food Insecurity  . Worried About Charity fundraiser in the Last Year: Never true  . Ran Out of Food in the Last Year: Never true  Transportation Needs: No Transportation Needs  . Lack of Transportation (Medical): No  . Lack of Transportation (Non-Medical): No  Physical Activity: Inactive  . Days of Exercise per Week: 0 days  . Minutes of Exercise per Session: 0 min  Stress: No Stress Concern Present  . Feeling of Stress : Not at all  Social Connections: Not on file     Family History: The patient's family history includes Cancer in his sister; Cancer (age of onset: 49) in his father; Diabetes in his brother; Heart attack (age of onset: 50) in his brother; Hyperlipidemia in his mother; Liver cancer (age of onset: 57) in his sister; Lung cancer (age of onset: 54) in his father; Stroke (age of onset: 95) in his mother.  ROS:   Please see the history of present illness.    All other systems reviewed and are negative.  EKGs/Labs/Other Studies Reviewed:    The following studies were reviewed today: Echo 02/28/2019: IMPRESSIONS    1. Left ventricular ejection fraction, by visual estimation, is 30 to  35%. The left ventricle has moderately decreased function. There is mildly  increased left ventricular hypertrophy. Global hypokinesis.  2. Left ventricular diastolic parameters are indeterminate.  3. Global right ventricle has mildly reduced systolic function.The right  ventricular size is mildly enlarged.  4.  Left atrial size was normal.  5. Right atrial size was mildly dilated.  6. The mitral valve is normal in structure. Trace mitral valve  regurgitation.  7. The tricuspid valve is normal in structure. Tricuspid valve  regurgitation is trivial.  8. The aortic valve is  tricuspid. Aortic valve regurgitation is not  visualized. Mild aortic valve sclerosis without stenosis.  9. The pulmonic valve was not well visualized. Pulmonic valve  regurgitation is not visualized.  10. There is dilatation of the ascending aorta measuring 39 mm.   EKG:  EKG is ordered today.  The ekg ordered today demonstrates normal sinus rhythm 73 bpm, right bundle branch block, no significant change from prior tracings.  Recent Labs: 01/31/2020: BUN 20; Creatinine 1.7; Hemoglobin 14.6; Platelets 165; Potassium 4.0; Sodium 140 05/10/2020: TSH 8.25  Recent Lipid Panel    Component Value Date/Time   CHOL 129 07/28/2019 0844   TRIG 93.0 07/28/2019 0844   HDL 40.50 07/28/2019 0844   CHOLHDL 3 07/28/2019 0844   VLDL 18.6 07/28/2019 0844   LDLCALC 70 07/28/2019 0844   LDLDIRECT 100.8 08/12/2010 1523     Risk Assessment/Calculations:    CHA2DS2-VASc Score = 5  This indicates a 7.2% annual risk of stroke. The patient's score is based upon: CHF History: Yes HTN History: Yes Diabetes History: No Stroke History: No Vascular Disease History: Yes Age Score: 2 Gender Score: 0      Physical Exam:    VS:  BP 136/70   Pulse 73   Ht 6\' 1"  (1.854 m)   Wt 226 lb 9.6 oz (102.8 kg)   SpO2 95%   BMI 29.90 kg/m     Wt Readings from Last 3 Encounters:  06/05/20 226 lb 9.6 oz (102.8 kg)  04/22/20 231 lb (104.8 kg)  03/11/20 231 lb (104.8 kg)     GEN:  Well nourished, well developed elderly male in no acute distress HEENT: Normal, hard of hearing NECK: No JVD; No carotid bruits LYMPHATICS: No lymphadenopathy CARDIAC: RRR, no murmurs, rubs, gallops RESPIRATORY:  Clear to auscultation without rales, wheezing  or rhonchi  ABDOMEN: Soft, non-tender, non-distended MUSCULOSKELETAL:  No edema; No deformity  SKIN: Warm and dry NEUROLOGIC:  Alert and oriented x 3 PSYCHIATRIC: Flat affect   ASSESSMENT:    1. Chronic systolic heart failure ([place])   2. PAF (paroxysmal atrial fibrillation) ([place])   3. Coronary artery disease involving native coronary artery of native heart without angina pectoris   4. Essential hypertension    PLAN:    In order of problems listed above:  1. Appears euvolemic on exam.  No longer taking loop diuretics.  He is not really on any specific medical therapy at this point.  His LVEF was depressed in the setting of atrial fibrillation.  I have recommended a repeat echocardiogram to try to guide treatment. 2. Maintaining sinus rhythm.  Today's EKG is reviewed.  He continues on dose adjusted rivaroxaban 15 mg daily.  He continues on amiodarone.  Labs are followed by his primary care physician and I have reviewed these today.  His levothyroxine was recently adjusted. 3. Stable without angina.  No antiplatelet therapy in the setting of oral anticoagulation. 4. Blood pressure well controlled at present.  Medication Adjustments/Labs and Tests Ordered: Current medicines are reviewed at length with the patient today.  Concerns regarding medicines are outlined above.  Orders Placed This Encounter  Procedures  . EKG 12-Lead  . ECHOCARDIOGRAM COMPLETE   No orders of the defined types were placed in this encounter.   Patient Instructions  Medication Instructions:  Your provider recommends that you continue on your current medications as directed. Please refer to the Current Medication list given to you today.   *If you need a refill on your cardiac medications before  your next appointment, please call your pharmacy*  Testing/Procedures: Your provider has requested that you have an echocardiogram. Echocardiography is a painless test that uses sound waves to create images of your  heart. It provides your doctor with information about the size and shape of your heart and how well your heart's chambers and valves are working. This procedure takes approximately one hour. There are no restrictions for this procedure.  Follow-Up: At [facility], you and your health needs are our priority.  As part of our continuing mission to provide you with exceptional heart care, we have created designated Provider Care Teams.  These Care Teams include your primary Cardiologist (physician) and Advanced Practice Providers (APPs -  Physician Assistants and Nurse Practitioners) who all work together to provide you with the care you need, when you need it. Your next appointment:   6 month(s) The format for your next appointment:   In Person Provider:   You will see one of the following Advanced Practice Providers on your designated Care Team:    Richardson Dopp, PA-C  Robbie Lis, [place]    Signed, [name], MD  06/06/2020 5:51 AM    [place]

## 2020-06-06 ENCOUNTER — Encounter: Payer: Self-pay | Admitting: Cardiovascular Disease

## 2020-06-26 ENCOUNTER — Other Ambulatory Visit: Payer: Self-pay

## 2020-06-26 ENCOUNTER — Ambulatory Visit (HOSPITAL_COMMUNITY): Payer: Medicare Other | Attending: Cardiology

## 2020-06-26 DIAGNOSIS — I5022 Chronic systolic (congestive) heart failure: Secondary | ICD-10-CM | POA: Diagnosis not present

## 2020-06-26 LAB — ECHOCARDIOGRAM COMPLETE
Area-P 1/2: 1.65 cm2
S' Lateral: 3.8 cm

## 2020-06-26 MED ORDER — PERFLUTREN LIPID MICROSPHERE
3.0000 mL | INTRAVENOUS | Status: AC | PRN
  Administered 2020-06-26: 3 mL via INTRAVENOUS

## 2020-06-27 ENCOUNTER — Ambulatory Visit: Payer: Medicare Other | Admitting: Cardiology

## 2020-06-28 ENCOUNTER — Telehealth: Payer: Self-pay

## 2020-06-28 MED ORDER — AMIODARONE HCL 200 MG PO TABS: 200.0000 mg | ORAL_TABLET | Freq: Every day | ORAL | 3 refills | Status: DC

## 2020-06-28 NOTE — Telephone Encounter (Signed)
Reviewed results with patient's DPR who verbalized understanding.   He states the patient is out of amiodarone and hasn't taken for a 3-4 weeks.  Confirmed with Dr. Marlou Porch no loading period is needed. Refills sent.

## 2020-06-28 NOTE — Telephone Encounter (Signed)
-----   Message from [name], MD sent at 06/28/2020 11:36 AM EDT ----- This study looks good. Normal LV function, no significant valvular disease. There are some age-related changes to the valves but no major narrowing or leaks. Continue medical treatment, no changes recommended. thanks

## 2020-07-31 ENCOUNTER — Other Ambulatory Visit (INDEPENDENT_AMBULATORY_CARE_PROVIDER_SITE_OTHER): Payer: Medicare Other

## 2020-07-31 ENCOUNTER — Other Ambulatory Visit: Payer: Self-pay

## 2020-07-31 DIAGNOSIS — E038 Other specified hypothyroidism: Secondary | ICD-10-CM | POA: Diagnosis not present

## 2020-07-31 LAB — TSH: TSH: 0.69 u[IU]/mL (ref 0.35–4.50)

## 2020-08-17 ENCOUNTER — Other Ambulatory Visit: Payer: Self-pay | Admitting: Cardiovascular Disease

## 2020-08-17 ENCOUNTER — Other Ambulatory Visit: Payer: Self-pay | Admitting: Primary Care

## 2020-08-17 DIAGNOSIS — I4891 Unspecified atrial fibrillation: Secondary | ICD-10-CM

## 2020-08-17 DIAGNOSIS — E785 Hyperlipidemia, unspecified: Secondary | ICD-10-CM

## 2020-08-27 ENCOUNTER — Other Ambulatory Visit: Payer: Self-pay | Admitting: Primary Care

## 2020-08-27 DIAGNOSIS — J309 Allergic rhinitis, unspecified: Secondary | ICD-10-CM

## 2020-09-19 ENCOUNTER — Telehealth: Payer: Self-pay | Admitting: Cardiovascular Disease

## 2020-09-19 ENCOUNTER — Other Ambulatory Visit: Payer: Self-pay | Admitting: Primary Care

## 2020-09-19 DIAGNOSIS — E038 Other specified hypothyroidism: Secondary | ICD-10-CM

## 2020-09-19 NOTE — Telephone Encounter (Signed)
   Pt c/o BP issue: STAT if pt c/o blurred vision, one-sided weakness or slurred speech  1. What are your last 5 BP readings? 184/92, 186/90  2. Are you having any other symptoms (ex. Dizziness, headache, blurred vision, passed out)? Asymptomatic   3. What is your BP issue?  NP Hassan Rowan with [facility] calling, she said pt been having elevated BP, she wanted to get Dr. Antionette Char recommendations if they need to make adjustment on pt's meds

## 2020-09-19 NOTE — Telephone Encounter (Signed)
Spoke with NP who states the patient's BP was 180s/90s today and she wants to know if meds should be adjusted. She reports he has not complained of dizziness or lightheadedness but has had several recent falls (she confirmed he has not had head trauma). She is getting a hip xray tomorrow because of his pain.  Called and spoke with the patient's son (DPR). Instructed him to have the patient limit salt and attempt to control pain and his BP should come down. He will find the patient's BP cuff and monitor BP throughout the week.   Will call next week to update.  They will call prior to that time if VA changes occur or if he experiences HA. Spoke with Jinny Blossom, PharmD. Will initiate amlodipine 5 mg daily if BP if BP still elevated.

## 2020-09-20 ENCOUNTER — Emergency Department (HOSPITAL_COMMUNITY): Payer: Medicare Other

## 2020-09-20 ENCOUNTER — Inpatient Hospital Stay (HOSPITAL_COMMUNITY)
Admission: EM | Admit: 2020-09-20 | Discharge: 2020-09-27 | DRG: 536 | Disposition: A | Discharge status: Skilled Nursing Facility | Payer: Medicare Other | Attending: Internal Medicine | Admitting: Internal Medicine

## 2020-09-20 ENCOUNTER — Other Ambulatory Visit: Payer: Self-pay

## 2020-09-20 ENCOUNTER — Telehealth: Payer: Self-pay

## 2020-09-20 DIAGNOSIS — N1831 Chronic kidney disease, stage 3a: Secondary | ICD-10-CM | POA: Diagnosis present

## 2020-09-20 DIAGNOSIS — R488 Other symbolic dysfunctions: Secondary | ICD-10-CM | POA: Diagnosis not present

## 2020-09-20 DIAGNOSIS — W19XXXA Unspecified fall, initial encounter: Secondary | ICD-10-CM | POA: Diagnosis not present

## 2020-09-20 DIAGNOSIS — S72111A Displaced fracture of greater trochanter of right femur, initial encounter for closed fracture: Principal | ICD-10-CM | POA: Diagnosis present

## 2020-09-20 DIAGNOSIS — K219 Gastro-esophageal reflux disease without esophagitis: Secondary | ICD-10-CM | POA: Diagnosis present

## 2020-09-20 DIAGNOSIS — Z87891 Personal history of nicotine dependence: Secondary | ICD-10-CM

## 2020-09-20 DIAGNOSIS — I129 Hypertensive chronic kidney disease with stage 1 through stage 4 chronic kidney disease, or unspecified chronic kidney disease: Secondary | ICD-10-CM | POA: Diagnosis not present

## 2020-09-20 DIAGNOSIS — Z96641 Presence of right artificial hip joint: Secondary | ICD-10-CM | POA: Diagnosis not present

## 2020-09-20 DIAGNOSIS — M6281 Muscle weakness (generalized): Secondary | ICD-10-CM | POA: Diagnosis not present

## 2020-09-20 DIAGNOSIS — S7001XA Contusion of right hip, initial encounter: Secondary | ICD-10-CM

## 2020-09-20 DIAGNOSIS — Z79890 Hormone replacement therapy (postmenopausal): Secondary | ICD-10-CM

## 2020-09-20 DIAGNOSIS — G319 Degenerative disease of nervous system, unspecified: Secondary | ICD-10-CM | POA: Diagnosis not present

## 2020-09-20 DIAGNOSIS — E785 Hyperlipidemia, unspecified: Secondary | ICD-10-CM | POA: Diagnosis not present

## 2020-09-20 DIAGNOSIS — Z8249 Family history of ischemic heart disease and other diseases of the circulatory system: Secondary | ICD-10-CM

## 2020-09-20 DIAGNOSIS — J320 Chronic maxillary sinusitis: Secondary | ICD-10-CM | POA: Diagnosis not present

## 2020-09-20 DIAGNOSIS — Z833 Family history of diabetes mellitus: Secondary | ICD-10-CM

## 2020-09-20 DIAGNOSIS — E782 Mixed hyperlipidemia: Secondary | ICD-10-CM | POA: Diagnosis not present

## 2020-09-20 DIAGNOSIS — E119 Type 2 diabetes mellitus without complications: Secondary | ICD-10-CM | POA: Diagnosis not present

## 2020-09-20 DIAGNOSIS — S72113A Displaced fracture of greater trochanter of unspecified femur, initial encounter for closed fracture: Secondary | ICD-10-CM | POA: Diagnosis present

## 2020-09-20 DIAGNOSIS — E1122 Type 2 diabetes mellitus with diabetic chronic kidney disease: Secondary | ICD-10-CM | POA: Diagnosis present

## 2020-09-20 DIAGNOSIS — G8929 Other chronic pain: Secondary | ICD-10-CM | POA: Diagnosis present

## 2020-09-20 DIAGNOSIS — M25551 Pain in right hip: Secondary | ICD-10-CM | POA: Diagnosis not present

## 2020-09-20 DIAGNOSIS — I252 Old myocardial infarction: Secondary | ICD-10-CM | POA: Diagnosis not present

## 2020-09-20 DIAGNOSIS — E039 Hypothyroidism, unspecified: Secondary | ICD-10-CM | POA: Diagnosis not present

## 2020-09-20 DIAGNOSIS — Z20822 Contact with and (suspected) exposure to covid-19: Secondary | ICD-10-CM | POA: Diagnosis not present

## 2020-09-20 DIAGNOSIS — Z7401 Bed confinement status: Secondary | ICD-10-CM | POA: Diagnosis not present

## 2020-09-20 DIAGNOSIS — N400 Enlarged prostate without lower urinary tract symptoms: Secondary | ICD-10-CM | POA: Diagnosis present

## 2020-09-20 DIAGNOSIS — I4510 Unspecified right bundle-branch block: Secondary | ICD-10-CM | POA: Diagnosis present

## 2020-09-20 DIAGNOSIS — J302 Other seasonal allergic rhinitis: Secondary | ICD-10-CM | POA: Diagnosis not present

## 2020-09-20 DIAGNOSIS — M1990 Unspecified osteoarthritis, unspecified site: Secondary | ICD-10-CM | POA: Diagnosis present

## 2020-09-20 DIAGNOSIS — D696 Thrombocytopenia, unspecified: Secondary | ICD-10-CM | POA: Diagnosis not present

## 2020-09-20 DIAGNOSIS — Z736 Limitation of activities due to disability: Secondary | ICD-10-CM | POA: Diagnosis not present

## 2020-09-20 DIAGNOSIS — S72001A Fracture of unspecified part of neck of right femur, initial encounter for closed fracture: Secondary | ICD-10-CM | POA: Diagnosis not present

## 2020-09-20 DIAGNOSIS — Z66 Do not resuscitate: Secondary | ICD-10-CM | POA: Diagnosis present

## 2020-09-20 DIAGNOSIS — Z7901 Long term (current) use of anticoagulants: Secondary | ICD-10-CM

## 2020-09-20 DIAGNOSIS — M79604 Pain in right leg: Secondary | ICD-10-CM | POA: Diagnosis not present

## 2020-09-20 DIAGNOSIS — S0081XA Abrasion of other part of head, initial encounter: Secondary | ICD-10-CM | POA: Diagnosis not present

## 2020-09-20 DIAGNOSIS — F419 Anxiety disorder, unspecified: Secondary | ICD-10-CM | POA: Diagnosis not present

## 2020-09-20 DIAGNOSIS — D509 Iron deficiency anemia, unspecified: Secondary | ICD-10-CM | POA: Diagnosis not present

## 2020-09-20 DIAGNOSIS — R531 Weakness: Secondary | ICD-10-CM | POA: Diagnosis not present

## 2020-09-20 DIAGNOSIS — Z79899 Other long term (current) drug therapy: Secondary | ICD-10-CM

## 2020-09-20 DIAGNOSIS — M961 Postlaminectomy syndrome, not elsewhere classified: Secondary | ICD-10-CM | POA: Diagnosis not present

## 2020-09-20 DIAGNOSIS — I4891 Unspecified atrial fibrillation: Secondary | ICD-10-CM | POA: Diagnosis present

## 2020-09-20 DIAGNOSIS — R102 Pelvic and perineal pain: Secondary | ICD-10-CM | POA: Diagnosis not present

## 2020-09-20 DIAGNOSIS — D309 Benign neoplasm of urinary organ, unspecified: Secondary | ICD-10-CM | POA: Diagnosis not present

## 2020-09-20 DIAGNOSIS — E875 Hyperkalemia: Secondary | ICD-10-CM | POA: Diagnosis not present

## 2020-09-20 DIAGNOSIS — Z981 Arthrodesis status: Secondary | ICD-10-CM

## 2020-09-20 DIAGNOSIS — Z86711 Personal history of pulmonary embolism: Secondary | ICD-10-CM

## 2020-09-20 DIAGNOSIS — I2510 Atherosclerotic heart disease of native coronary artery without angina pectoris: Secondary | ICD-10-CM | POA: Diagnosis not present

## 2020-09-20 DIAGNOSIS — Z83438 Family history of other disorder of lipoprotein metabolism and other lipidemia: Secondary | ICD-10-CM | POA: Diagnosis not present

## 2020-09-20 DIAGNOSIS — Z96651 Presence of right artificial knee joint: Secondary | ICD-10-CM | POA: Diagnosis not present

## 2020-09-20 DIAGNOSIS — I10 Essential (primary) hypertension: Secondary | ICD-10-CM | POA: Diagnosis not present

## 2020-09-20 DIAGNOSIS — Z951 Presence of aortocoronary bypass graft: Secondary | ICD-10-CM

## 2020-09-20 DIAGNOSIS — E038 Other specified hypothyroidism: Secondary | ICD-10-CM | POA: Diagnosis not present

## 2020-09-20 DIAGNOSIS — I480 Paroxysmal atrial fibrillation: Secondary | ICD-10-CM | POA: Diagnosis present

## 2020-09-20 DIAGNOSIS — M9701XA Periprosthetic fracture around internal prosthetic right hip joint, initial encounter: Secondary | ICD-10-CM | POA: Diagnosis not present

## 2020-09-20 DIAGNOSIS — I639 Cerebral infarction, unspecified: Secondary | ICD-10-CM | POA: Diagnosis not present

## 2020-09-20 DIAGNOSIS — K582 Mixed irritable bowel syndrome: Secondary | ICD-10-CM | POA: Diagnosis not present

## 2020-09-20 DIAGNOSIS — M9701XD Periprosthetic fracture around internal prosthetic right hip joint, subsequent encounter: Secondary | ICD-10-CM | POA: Diagnosis not present

## 2020-09-20 NOTE — Telephone Encounter (Signed)
[name], patient's son, called asking for RX for walker and that [facility] reached out to him about this. There is a scanned document in media section from [place] from January 2022 that has number and fax number on there. [name] does not know the phone number or fax number to them. Please review.  [facility] [phone]

## 2020-09-20 NOTE — Telephone Encounter (Signed)
NP, Theadora Rama, went out to see pt yesterday for an urgent visit at pt's request because he had fallen yesterday. He has felt unbalanced 1-2 weeks. He has had several falls in the last month. They are asking if a walker could be ordered. They will have son call us about getting a rx. I advised her it is best for them to get the rx and take it where he wants to go to get it. We do not have a preference.

## 2020-09-20 NOTE — Telephone Encounter (Signed)
No problem and I'm happy to help. Do they want the regular walker or Rolator with a seat?  Okay to order and send in.   Dx: Recurrent falls.

## 2020-09-20 NOTE — ED Triage Notes (Signed)
Pt arrives from home for eval of R hip pain after fall yesterday. Home health came out to the house and did x rays but results will not be back until after 5 and they do not work after 5. Pt reports worsening balance issues over the last month. Fell when bending over to pick something up a few days [place].

## 2020-09-20 NOTE — ED Provider Notes (Signed)
Emergency Medicine Provider Triage Evaluation Note  Leonard [name] , a 87 y.o. male  was evaluated in triage.  Pt complains of fall that occurred a few days ago. Son at bedside states pt fell after tripping outside. He hit his head and is c/o right hip pain. Per son pt is in his normal states of health and has not been any more confused than he normally is  Review of Systems  Positive: Head injury, right hip pain Negative: fever  Physical Exam  There were no vitals taken for this visit. Gen:   Awake, no distress   Resp:  Normal effort  MSK:   Moves extremities without difficulty  Other:  Clear speech, no facial droop, moving all extremities. Rle has some weakness secondary to pain  Medical Decision Making  Medically screening exam initiated at 5:28 PM.  Appropriate orders placed.  Leonard Back Trudo Sr. was informed that the remainder of the evaluation will be completed by another provider, this initial triage assessment does not replace that evaluation, and the importance of remaining in the ED until their evaluation is complete.    Rodney Booze, PA-C 09/20/20 1731    Orpah Greek, MD 09/21/20 [phone]

## 2020-09-21 ENCOUNTER — Observation Stay (HOSPITAL_COMMUNITY): Payer: Medicare Other

## 2020-09-21 DIAGNOSIS — E038 Other specified hypothyroidism: Secondary | ICD-10-CM

## 2020-09-21 DIAGNOSIS — M79604 Pain in right leg: Secondary | ICD-10-CM | POA: Diagnosis not present

## 2020-09-21 DIAGNOSIS — W19XXXA Unspecified fall, initial encounter: Secondary | ICD-10-CM | POA: Diagnosis not present

## 2020-09-21 DIAGNOSIS — S72113A Displaced fracture of greater trochanter of unspecified femur, initial encounter for closed fracture: Secondary | ICD-10-CM | POA: Diagnosis present

## 2020-09-21 DIAGNOSIS — E782 Mixed hyperlipidemia: Secondary | ICD-10-CM

## 2020-09-21 DIAGNOSIS — I480 Paroxysmal atrial fibrillation: Secondary | ICD-10-CM | POA: Diagnosis not present

## 2020-09-21 DIAGNOSIS — E119 Type 2 diabetes mellitus without complications: Secondary | ICD-10-CM

## 2020-09-21 DIAGNOSIS — Z96651 Presence of right artificial knee joint: Secondary | ICD-10-CM | POA: Diagnosis not present

## 2020-09-21 DIAGNOSIS — M25551 Pain in right hip: Secondary | ICD-10-CM | POA: Diagnosis not present

## 2020-09-21 DIAGNOSIS — R102 Pelvic and perineal pain: Secondary | ICD-10-CM | POA: Diagnosis not present

## 2020-09-21 DIAGNOSIS — S72111A Displaced fracture of greater trochanter of right femur, initial encounter for closed fracture: Secondary | ICD-10-CM | POA: Diagnosis not present

## 2020-09-21 DIAGNOSIS — D696 Thrombocytopenia, unspecified: Secondary | ICD-10-CM

## 2020-09-21 LAB — CBC
HCT: 43.9 % (ref 39.0–52.0)
Hemoglobin: 13.9 g/dL (ref 13.0–17.0)
MCH: 32.3 pg (ref 26.0–34.0)
MCHC: 31.7 g/dL (ref 30.0–36.0)
MCV: 101.9 fL — ABNORMAL HIGH (ref 80.0–100.0)
Platelets: 141 10*3/uL — ABNORMAL LOW (ref 150–400)
RBC: 4.31 MIL/uL (ref 4.22–5.81)
RDW: 12.4 % (ref 11.5–15.5)
WBC: 7.5 10*3/uL (ref 4.0–10.5)
nRBC: 0 % (ref 0.0–0.2)

## 2020-09-21 LAB — BASIC METABOLIC PANEL
Anion gap: 7 (ref 5–15)
BUN: 18 mg/dL (ref 8–23)
CO2: 25 mmol/L (ref 22–32)
Calcium: 8.8 mg/dL — ABNORMAL LOW (ref 8.9–10.3)
Chloride: 106 mmol/L (ref 98–111)
Creatinine, Ser: 1.55 mg/dL — ABNORMAL HIGH (ref 0.61–1.24)
GFR, Estimated: 43 mL/min — ABNORMAL LOW (ref >60)
Glucose, Bld: 110 mg/dL — ABNORMAL HIGH (ref 70–99)
Potassium: 4.1 mmol/L (ref 3.5–5.1)
Sodium: 138 mmol/L (ref 135–145)

## 2020-09-21 LAB — CK: Total CK: 72 U/L (ref 49–397)

## 2020-09-21 LAB — RESP PANEL BY RT-PCR (FLU A&B, COVID) ARPGX2
Influenza A by PCR: NEGATIVE
Influenza B by PCR: NEGATIVE
SARS Coronavirus 2 by RT PCR: NEGATIVE

## 2020-09-21 MED ORDER — ALBUTEROL SULFATE (2.5 MG/3ML) 0.083% IN NEBU: 2.5000 mg | INHALATION_SOLUTION | Freq: Four times a day (QID) | RESPIRATORY_TRACT | Status: DC | PRN

## 2020-09-21 MED ORDER — LEVOTHYROXINE SODIUM 75 MCG PO TABS: 150.0000 ug | ORAL_TABLET | ORAL | Status: DC

## 2020-09-21 MED ORDER — ONDANSETRON HCL 4 MG PO TABS: 4.0000 mg | ORAL_TABLET | Freq: Four times a day (QID) | ORAL | Status: DC | PRN

## 2020-09-21 MED ORDER — FUROSEMIDE 40 MG PO TABS
40.0000 mg | ORAL_TABLET | Freq: Every morning | ORAL | Status: DC
  Administered 2020-09-22 – 2020-09-27 (×6): 40 mg via ORAL
  Filled 2020-09-21 (×6): qty 1

## 2020-09-21 MED ORDER — HYDROCODONE-ACETAMINOPHEN 5-325 MG PO TABS
1.0000 | ORAL_TABLET | Freq: Four times a day (QID) | ORAL | Status: DC | PRN
  Administered 2020-09-21 – 2020-09-23 (×3): 1 via ORAL
  Filled 2020-09-21 (×3): qty 1

## 2020-09-21 MED ORDER — ONDANSETRON HCL 4 MG/2ML IJ SOLN
4.0000 mg | Freq: Once | INTRAMUSCULAR | Status: AC
  Administered 2020-09-21: 4 mg via INTRAVENOUS
  Filled 2020-09-21: qty 2

## 2020-09-21 MED ORDER — ONDANSETRON HCL 4 MG/2ML IJ SOLN: 4.0000 mg | Freq: Four times a day (QID) | INTRAMUSCULAR | Status: DC | PRN

## 2020-09-21 MED ORDER — ACETAMINOPHEN 325 MG PO TABS
650.0000 mg | ORAL_TABLET | Freq: Four times a day (QID) | ORAL | Status: DC | PRN
  Administered 2020-09-25 – 2020-09-26 (×2): 650 mg via ORAL
  Filled 2020-09-21 (×2): qty 2

## 2020-09-21 MED ORDER — LEVOTHYROXINE SODIUM 100 MCG PO TABS
100.0000 ug | ORAL_TABLET | ORAL | Status: DC
  Administered 2020-09-22 – 2020-09-26 (×4): 100 ug via ORAL
  Filled 2020-09-21 (×5): qty 1

## 2020-09-21 MED ORDER — ACETAMINOPHEN 650 MG RE SUPP: 650.0000 mg | Freq: Four times a day (QID) | RECTAL | Status: DC | PRN

## 2020-09-21 MED ORDER — AMIODARONE HCL 200 MG PO TABS
200.0000 mg | ORAL_TABLET | Freq: Every morning | ORAL | Status: DC
  Administered 2020-09-22 – 2020-09-27 (×6): 200 mg via ORAL
  Filled 2020-09-21 (×6): qty 1

## 2020-09-21 MED ORDER — ROSUVASTATIN CALCIUM 5 MG PO TABS
10.0000 mg | ORAL_TABLET | Freq: Every evening | ORAL | Status: DC
  Administered 2020-09-21 – 2020-09-26 (×6): 10 mg via ORAL
  Filled 2020-09-21 (×6): qty 2

## 2020-09-21 MED ORDER — SODIUM CHLORIDE 0.9% FLUSH
3.0000 mL | Freq: Two times a day (BID) | INTRAVENOUS | Status: DC
  Administered 2020-09-21 – 2020-09-26 (×11): 3 mL via INTRAVENOUS

## 2020-09-21 MED ORDER — RIVAROXABAN 15 MG PO TABS
15.0000 mg | ORAL_TABLET | Freq: Every day | ORAL | Status: DC
  Administered 2020-09-21: 15 mg via ORAL
  Filled 2020-09-21: qty 1

## 2020-09-21 MED ORDER — MORPHINE SULFATE (PF) 4 MG/ML IV SOLN
4.0000 mg | Freq: Once | INTRAVENOUS | Status: AC
  Administered 2020-09-21: 4 mg via INTRAVENOUS
  Filled 2020-09-21: qty 1

## 2020-09-21 NOTE — ED Notes (Signed)
MRI cancelled d/t ineligible. Pending PT assessment, paged out at this time. Resting comfortably.

## 2020-09-21 NOTE — ED Notes (Signed)
Patient transported to CT 

## 2020-09-21 NOTE — Evaluation (Signed)
Physical Therapy Evaluation Patient Details Name: Leonard CISLO Sr. MRN: [id number] DOB: May 12, 1929 Today's Date: 09/21/2020   History of Present Illness  87 y.o. male presented to ED 09/20/20  Pt complains of fall that occurred a few days ago. Son at bedside states pt fell after tripping outside. He hit his head and is c/o right hip pain. Per son pt is in his normal states of health and has not been any more confused than he normally is.  Clinical Impression   Pt admitted secondary to problem above with deficits below. PTA patient was having frequent falls at home. Walking with no device indoors and living alone. [place]. Pt currently requires min assist for transfers from an elevated ED stretcher and only able to walk 12 ft with pt self-selecting to only be TDWB to NWB on RLE due to groin and lateral knee pain. Patient cannot ascend 3 steps into his home in current state. He will need more time with therapies prior to returning home.  Will continue to follow acutely to maximize functional mobility independence and safety.       Follow Up Recommendations SNF;Supervision/Assistance - 24 hour    Equipment Recommendations  Rolling walker with 5" wheels    Recommendations for Other Services OT consult     Precautions / Restrictions Precautions Precautions: Fall Precaution Comments: pt has frequent falls at home (per pt) Restrictions Weight Bearing Restrictions: No Other Position/Activity Restrictions: pt self-limits WB'g on RLE due to Rt hip and knee pain      Mobility  Bed Mobility Overal bed mobility: Needs Assistance Bed Mobility: Supine to Sit;Sit to Supine     Supine to sit: Min guard;HOB elevated (on ED stretcher) Sit to supine: HOB elevated;Mod assist (on ED stretcher)   General bed mobility comments: pt required assist with bil LEs as he had sharp rt groin pain when trying to lift either leg onto bed    Transfers Overall transfer  level: Needs assistance Equipment used: Rolling walker (2 wheeled) Transfers: Sit to/from Stand Sit to Stand: Min assist;From elevated surface (ED stretcher; likely mod from regular/standard height surface)         General transfer comment: pt puts no weight on RLE and holds it out in front of him with knee extended; incr time and cues for sequencing hands up to RW from surface  Ambulation/Gait Ambulation/Gait assistance: Min assist Gait Distance (Feet): 12 Feet Assistive device: Rolling walker (2 wheeled) Gait Pattern/deviations: Step-to pattern;Antalgic Gait velocity: very slow   General Gait Details: heavy reliance on UEs via RW; unable to bear more than TDWB on RLE  Stairs            Wheelchair Mobility    Modified Rankin (Stroke Patients Only)       Balance Overall balance assessment: Needs assistance                                           Pertinent Vitals/Pain Pain Assessment: 0-10 Pain Score: 10-Worst pain ever Pain Location: rt inner groin and rt lateral knee Pain Descriptors / Indicators: Shooting Pain Intervention(s): Limited activity within patient's tolerance;Monitored during session;Premedicated before session;Repositioned    Home Living Family/patient expects to be discharged to:: Skilled nursing facility Living Arrangements: Alone Available Help at Discharge: Family;Available PRN/intermittently Type of Home: House Home Access: Stairs to enter Entrance Stairs-Rails:  Right;Left Entrance Stairs-Number of Steps: 3 Home Layout: Two level;Able to live on main level with bedroom/bathroom Home Equipment: Walker - 4 wheels;Cane - single point;Shower seat      Prior Function Level of Independence: Independent with assistive device(s)         Comments: but having multiple falls; has been using rollator inside since fell and hurt his leg (normally uses no device inside and rollator outside)     Hand Dominance         Extremity/Trunk Assessment   Upper Extremity Assessment Upper Extremity Assessment: Overall WFL for tasks assessed    Lower Extremity Assessment Lower Extremity Assessment: RLE deficits/detail RLE Deficits / Details: AROM rt knee to 80 degrees (in sitting) with incr pain; rt hip flexion 10 degrees; unable to tolerate putting weight through RLE and heavily relies on bil UEs on walker RLE: Unable to fully assess due to pain RLE Sensation: WNL    Cervical / Trunk Assessment Cervical / Trunk Assessment: Normal  Communication   Communication: HOH  Cognition Arousal/Alertness: Awake/alert Behavior During Therapy: WFL for tasks assessed/performed Overall Cognitive Status: History of cognitive impairments - at baseline (per son's report to MD (see note))                                 General Comments: repeats himself and doesn't realize he already told you      General Comments      Exercises     Assessment/Plan    PT Assessment Patient needs continued PT services  PT Problem List Decreased range of motion;Decreased activity tolerance;Decreased balance;Decreased mobility;Decreased cognition;Decreased knowledge of use of DME;Pain       PT Treatment Interventions DME instruction;Gait training;Stair training;Functional mobility training;Therapeutic activities;Therapeutic exercise;Balance training;Cognitive remediation;Patient/family education    PT Goals (Current goals can be found in the Care Plan section)  Acute Rehab PT Goals Patient Stated Goal: go somewhere in [place] to get help.Marland KitchenMarland KitchenI can't go up my steps like this PT Goal Formulation: With patient Time For Goal Achievement: 10/05/20 Potential to Achieve Goals: Good    Frequency Min 5X/week   Barriers to discharge Decreased caregiver support      Co-evaluation               AM-PAC PT "6 Clicks" Mobility  Outcome Measure Help needed turning from your back to your side while in a flat bed  without using bedrails?: A Little Help needed moving from lying on your back to sitting on the side of a flat bed without using bedrails?: A Little Help needed moving to and from a bed to a chair (including a wheelchair)?: A Lot Help needed standing up from a chair using your arms (e.g., wheelchair or bedside chair)?: A Lot Help needed to walk in hospital room?: A Little Help needed climbing 3-5 steps with a railing? : Total 6 Click Score: 14    End of Session Equipment Utilized During Treatment: Gait belt Activity Tolerance: Patient limited by pain Patient left: in bed;with call bell/phone within reach (back on ED stretcher) Nurse Communication: Mobility status;Other (comment) (will need more therapy) PT Visit Diagnosis: Other abnormalities of gait and mobility (R26.89);Pain Pain - Right/Left: Right Pain - part of body: Hip;Knee    Time: 1914-7829 PT Time Calculation (min) (ACUTE ONLY): 30 min   Charges:               Arby Barrette, PT Pager [phone])  [place] Ronya [place] 09/21/2020, 1:13 PM

## 2020-09-21 NOTE — H&P (Addendum)
History and Physical    SHO SALGUERO Sr. IHK:[id number] DOB: January 09, 1930 DOA: 09/20/2020  Referring MD/NP/PA: Joseph Berkshire, MD PCP: [name], NP  Patient coming from: home  Chief Complaint: right hip pain  I have personally briefly reviewed patient's old medical records in [place]   HPI: LONG BRIMAGE Sr. is a 87 y.o. male with medical history significant of hypertension, hyperlipidemia, paroxysmal atrial fibrillation on chronic anticoagulation, hypothyroidism, chronic thrombocytopenia, and DVT presents with complaints of right hip pain after a fall at home.  He was outside 4 to 5 days ago and was bending over picking [place] twine [place] the farm when he lost his balance falling forward hitting his head.  Denied any loss of consciousness.  He was able to get back up, but sustained abrasions to his forehead.  Then 2-3 days ago, fell again in while trying to open latch to a gate on the farm. He landed on his right hip and did not hit his head or lose consciousness.  Since that time he has been unable to bear weight on the right leg.  He had been complaining of pain running down his thigh whenever he tries to rotate his right leg.  Patient had previously had hip replacement on that side by Dr. [name] several years ago.  He denies having any confusion and is on anticoagulation for paroxysmal atrial fibrillation.  ED Course: Upon admission into the emergency department bradycardia was seen to be afebrile, blood pressures elevated up to 184/80, and all other vital signs maintained.  Labs significant for MCV 101.9, platelets 141, BUN 18, and creatinine 1.55.  CT scan of the head showed no acute abnormality.  X-rays of the pelvis showed no acute fractures.  Patient was evaluated by physical therapy with was unable to stand.  He had been given morphine for pain.  The plan was for patient to have MRI of his pelvis, but this was unable to be performed as patient has a stimulator in  place.  Influenza and COVID-19 screening were negative.  TRH called to admit.  Review of Systems  Constitutional:  Negative for chills and fever.  HENT:  Positive for hearing loss. Negative for nosebleeds.   Eyes:  Negative for photophobia and pain.  Respiratory:  Negative for cough and shortness of breath.   Cardiovascular:  Negative for chest pain and leg swelling.  Gastrointestinal:  Negative for abdominal pain.  Genitourinary:  Negative for dysuria and hematuria.  Musculoskeletal:  Positive for falls.  Neurological:  Negative for focal weakness and loss of consciousness.       Patient reports being off balance intermittently  Psychiatric/Behavioral:  Negative for substance abuse.    Past Medical History:  Diagnosis Date   Anemia    Anxiety    Arrhythmia    PAROXYSMAL ATRIAL FIBRILLATION   Arthritis    OSTEOARTHRITIS   Cataracts, bilateral    Chronic back pain    Coronary artery disease 8/11   CABG...LM EMERGENT WITH IABP sees Dr. Legrand Como cooper   DJD (degenerative joint disease)    DVT (deep venous thrombosis) (HCC)    Dyspnea    GERD (gastroesophageal reflux disease)    History of BPH    Hyperlipidemia    Hypertension    Hypothyroidism    Iron deficiency anemia due to chronic blood loss 06/29/2019   Lumbar post-laminectomy syndrome    Myocardial infarction [facility])    Pulmonary embolism ([place]) 2009   hx of  RBBB (right bundle branch block)    Renal artery stenosis (HCC)    Thrombocytopenia (HCC)    Thyroid disease    HYPOTHYROIDISM   Type 2 diabetes mellitus without complication, without long-term current use of insulin ([place]) 07/28/2019   Unstable angina ([place])    NONE IN LONG TIME    Past Surgical History:  Procedure Laterality Date   Blood clot  Feb.20,  2016   Pulmonary Embolism   CARDIOVERSION N/A 03/15/2019   Procedure: CARDIOVERSION;  Surgeon: Pixie Casino, MD;  Location: [place];  Service: Cardiovascular;  Laterality: N/A;   CAROTID ENDARTERECTOMY  Right Oct. 17, 2013   CE   CAROTID ENDARTERECTOMY Left Dec. 3, 2016   CE   CATARACT EXTRACTION  05/2014,06/2014   COLONOSCOPY WITH PROPOFOL N/A 05/20/2019   Procedure: COLONOSCOPY WITH PROPOFOL;  Surgeon: [name]., MD;  Location: [place];  Service: Gastroenterology;  Laterality: N/A;   CORONARY ARTERY BYPASS GRAFT  11-21-09   EMERGENT X 3 GRAFTING. ..[name], MD, cc: [name]   ENDARTERECTOMY  01/28/2012   Procedure: ENDARTERECTOMY CAROTID;  Surgeon: [name], MD;  Location: [facility] [facility] [employer];  Service: Vascular;  Laterality: Right;  with Primary Closure of Artery   ENDARTERECTOMY  03/15/2012   Procedure: ENDARTERECTOMY CAROTID;  Surgeon: [name], MD;  Location: [place];  Service: Vascular;  Laterality: Left;   ENTEROSCOPY N/A 05/23/2019   Procedure: ENTEROSCOPY;  Surgeon: [name], MD;  Location: [place];  Service: Endoscopy;  Laterality: N/A;   ESOPHAGOGASTRODUODENOSCOPY N/A 05/20/2019   Procedure: ESOPHAGOGASTRODUODENOSCOPY (EGD);  Surgeon: [name]., MD;  Location: [place];  Service: Gastroenterology;  Laterality: N/A;   ESOPHAGOGASTRODUODENOSCOPY (EGD) WITH PROPOFOL N/A 05/19/2019   Procedure: ESOPHAGOGASTRODUODENOSCOPY (EGD) WITH PROPOFOL;  Surgeon: [name], MD;  Location: [place];  Service: Endoscopy;  Laterality: N/A;   FOOT SURGERY     GIVENS CAPSULE STUDY N/A 05/20/2019   Procedure: GIVENS CAPSULE STUDY;  Surgeon: [name]., MD;  Location: [place];  Service: Gastroenterology;  Laterality: N/A;   HOT HEMOSTASIS N/A 05/23/2019   Procedure: HOT HEMOSTASIS (ARGON PLASMA COAGULATION/BICAP);  Surgeon: [name], MD;  Location: [place];  Service: Endoscopy;  Laterality: N/A;   MANDIBLE SURGERY     MAXIMUM ACCESS (MAS)POSTERIOR LUMBAR INTERBODY FUSION (PLIF) 2 LEVEL N/A 08/08/2015   Procedure: Lumbar Four-Five, Lumbar Five-Sacral One Maximum access posterior lumbar interbody fusion;   Surgeon: [name], MD;  Location: [place] [facility];  Service: Neurosurgery;  Laterality: N/A;  LUMBAR FOUR-FIVE ,LUMBAR FIVE -SACRAL Maximum access posterior lumbar interbody fusion   POLYPECTOMY  05/20/2019   Procedure: POLYPECTOMY;  Surgeon: [name], Telford Nab., MD;  Location: [place];  Service: Gastroenterology;;   RENAL ARTERY STENT  2010   REPLACEMENT TOTAL KNEE  2006   RIGHT   SPINAL CORD STIMULATOR INSERTION N/A 06/18/2017   Procedure: LUMBAR SPINAL CORD STIMULATOR INSERTION;  Surgeon: [name], MD;  Location: [place];  Service: Neurosurgery;  Laterality: N/A;  LUMBAR SPINAL CORD STIMULATOR INSERTION   TOTAL HIP ARTHROPLASTY     right     reports that he quit smoking about 46 years ago. His smoking use included cigarettes. He quit smokeless tobacco use about 18 years ago.  His smokeless tobacco use included chew. He reports previous alcohol use of about 2.0 standard drinks of alcohol per week. He reports that he does not use drugs.  No Known Allergies  Family History  Problem Relation Age of Onset  Lung cancer Father 36       deceased    Cancer Father 44       LUNG   Stroke Mother 91       DECEASED    Hyperlipidemia Mother    Heart attack Brother 26       deceased   Diabetes Brother    Liver cancer Sister 33       deceased    Cancer Sister     Prior to Admission medications   Medication Sig Start Date End Date Taking? Authorizing Provider  amiodarone (PACERONE) 200 MG tablet Take 1 tablet (200 mg total) by mouth daily. Patient taking differently: Take 200 mg by mouth in the morning. 06/28/20 06/23/21 Yes [name], MD  cetirizine (ZYRTEC) 10 MG tablet TAKE 1 TABLET BY MOUTH ONCE DAILY FOR ALLERGIES. 08/28/20  Yes [name], NP  ferrous sulfate 324 MG TBEC Take 324 mg by mouth at bedtime.   Yes [provider]  furosemide (LASIX) 40 MG tablet Take 40 mg by mouth in the morning. 07/30/20  Yes [provider]  levothyroxine (SYNTHROID)  100 MCG tablet TAKE 1 TABLET BY MOUTH ON EMPTY STOMACH WITH WATER ONLY ON SUNDAY THROUGH FRIDAY. TAKE 1 & 1/2 TABS SATURDAY ONLY.NO FOOD OR MEDS FOR 30 MINUTES AFTER. Patient taking differently: TAKE 1 TABLET BY MOUTH ON EMPTY STOMACH WITH WATER ONLY ON SUNDAY THROUGH FRIDAY. TAKE 1 & 1/2 TABS Saturday ONLY.NO FOOD OR MEDS FOR 30 MINUTES AFTER. 09/19/20  Yes [name], NP  Rivaroxaban (XARELTO) 15 MG TABS tablet Take 1 tablet (15 mg total) by mouth daily with supper. 08/19/20  Yes [name], NP  rosuvastatin (CRESTOR) 10 MG tablet TAKE ONE TABLET BY MOUTH IN THE EVENING FOR CHOLESTEROL. 08/19/20  Yes [name], NP  vitamin B-12 (CYANOCOBALAMIN) 1000 MCG tablet TAKE 1 TABLET BY MOUTH ONCE DAILY 05/13/20  Yes Earlie Server, MD  docusate sodium (COLACE) 100 MG capsule Take 2 capsules (200 mg total) by mouth daily. Patient not taking: Reported on 09/21/2020 05/25/19   Barb Merino, MD  LINZESS 145 MCG CAPS capsule Take 1 capsule (145 mcg total) by mouth daily before breakfast. Patient not taking: No sig reported 12/22/19   [name], NP  tamsulosin (FLOMAX) 0.4 MG CAPS capsule Take 1 capsule (0.4 mg total) by mouth daily. For urine flow. Patient not taking: Reported on 09/21/2020 05/02/20   [name], NP    Physical Exam:  Constitutional: Elderly male currently in no acute distress Vitals:   09/21/20 1145 09/21/20 1200 09/21/20 1215 09/21/20 1230  BP: (!) 144/65 (!) 145/65 (!) 147/71 (!) 184/80  Pulse: 66 67 67 74  Resp:      Temp:      TempSrc:      SpO2: 97% 97% 97% 98%   Eyes: PERRL, lids and conjunctivae normal ENMT: Mucous membranes are moist. Posterior pharynx clear of any exudate or lesions.hard of hearing. Neck: normal, supple, no masses, no thyromegaly Respiratory: clear to auscultation bilaterally, no wheezing, no crackles. Normal respiratory effort. No accessory muscle use.  Cardiovascular: Regular rate and rhythm, no murmurs / rubs / gallops. No  extremity edema. 2+ pedal pulses. No carotid bruits.  Abdomen: no tenderness, no masses palpated. No hepatosplenomegaly. Bowel sounds positive.  Musculoskeletal: no clubbing / cyanosis.  Tenderness to palpation of the right hip with decreased range of motion.  No gross deformity appreciated. Skin: Dried blood from abrasions of forehead appreciated  Neurologic: CN 2-12 grossly intact. Sensation intact, DTR normal. Strength 5/5 in all 4.  Psychiatric: Normal judgment and insight. Alert and oriented x 3. Normal mood.     Labs on Admission: I have personally reviewed following labs and imaging studies  CBC: Recent Labs  Lab 09/21/20 0834  WBC 7.5  HGB 13.9  HCT 43.9  MCV 101.9*  PLT 740*   Basic Metabolic Panel: Recent Labs  Lab 09/21/20 0834  NA 138  K 4.1  CL 106  CO2 25  GLUCOSE 110*  BUN 18  CREATININE 1.55*  CALCIUM 8.8*   GFR: CrCl cannot be calculated (Unknown ideal weight.). Liver Function Tests: No results for input(s): AST, ALT, ALKPHOS, BILITOT, PROT, ALBUMIN in the last 168 hours. No results for input(s): LIPASE, AMYLASE in the last 168 hours. No results for input(s): AMMONIA in the last 168 hours. Coagulation Profile: No results for input(s): INR, PROTIME in the last 168 hours. Cardiac Enzymes: No results for input(s): CKTOTAL, CKMB, CKMBINDEX, TROPONINI in the last 168 hours. BNP (last 3 results) No results for input(s): PROBNP in the last 8760 hours. HbA1C: No results for input(s): HGBA1C in the last 72 hours. CBG: No results for input(s): GLUCAP in the last 168 hours. Lipid Profile: No results for input(s): CHOL, HDL, LDLCALC, TRIG, CHOLHDL, LDLDIRECT in the last 72 hours. Thyroid Function Tests: No results for input(s): TSH, T4TOTAL, FREET4, T3FREE, THYROIDAB in the last 72 hours. Anemia Panel: No results for input(s): VITAMINB12, FOLATE, FERRITIN, TIBC, IRON, RETICCTPCT in the last 72 hours. Urine analysis:    Component Value Date/Time    COLORURINE YELLOW 03/03/2012 [place] 03/03/2012 1317   LABSPEC 1.026 03/03/2012 1317   PHURINE 5.0 03/03/2012 1317   GLUCOSEU NEGATIVE 03/03/2012 1317   HGBUR NEGATIVE 03/03/2012 1317   BILIRUBINUR neg 04/22/2020 1347   KETONESUR 15 (A) 03/03/2012 1317   PROTEINUR Negative 04/22/2020 1347   PROTEINUR NEGATIVE 03/03/2012 1317   UROBILINOGEN 0.2 04/22/2020 1347   UROBILINOGEN 0.2 03/03/2012 1317   NITRITE neg 04/22/2020 1347   NITRITE NEGATIVE 03/03/2012 1317   LEUKOCYTESUR Negative 04/22/2020 1347   Sepsis Labs: No results found for this or any previous visit (from the past 240 hour(s)).   Radiological Exams on Admission: CT Head Wo Contrast  Result Date: 09/20/2020 CLINICAL DATA:  Worsening balance issues over the past months. EXAM: CT HEAD WITHOUT CONTRAST TECHNIQUE: Contiguous axial images were obtained from the base of the skull through the vertex without intravenous contrast. COMPARISON:  None. FINDINGS: Brain: Advanced cerebral and cerebellar atrophy. Left larger than right remote cerebellar hemispheric infarcts. Cortically based hypoattenuation within the right occipital lobe including on 22/3 is without significant mass effect, favored to represent a remote infarct. No mass lesion, hemorrhage, hydrocephalus, acute infarct, intra-axial, or extra-axial fluid collection. Vascular: Intracranial atherosclerosis. Skull: No significant soft tissue swelling.  No skull fracture. Sinuses/Orbits: Normal imaged portions of the orbits and globes. The left maxillary sinus is expanded and filled with heterogeneously hyperattenuating material, likely due to chronic sinusitis and inspissated secretions. This extends into the left ethmoid air cells. This was present but progressive compared to the CTA of the neck of 01/26/2012. Clear mastoid air cells. Other: None. IMPRESSION: 1.  No acute intracranial abnormality. 2. Bilateral cerebellar lacunar infarcts. Right occipital hypoattenuation  is favored to relate to a remote PCA infarct. 3. Advanced cerebral and cerebellar atrophy. 4. Progressive chronic sinusitis involving the left maxillary sinus. Electronically Signed   By: Abigail Miyamoto  M.D.   On: 09/20/2020 18:41   DG Hip Unilat W or Wo Pelvis 2-3 Views Right  Result Date: 09/20/2020 CLINICAL DATA:  Several falls this week. Right hip pain. EXAM: DG HIP (WITH OR WITHOUT PELVIS) 2-3V RIGHT COMPARISON:  None. FINDINGS: The right hip prosthesis is intact. No periprosthetic fracture is identified. The pubic symphysis and SI joints are intact. No pelvic fractures. The left hip is intact. Lumbar spine surgical hardware noted. Extensive vascular calcifications. IMPRESSION: No acute bony findings. Electronically Signed   By: Marijo Sanes M.D.   On: 09/20/2020 18:30    Telemetry: Independently reviewed.  Patient appears to be in sinus rhythm at this time.  Assessment/Plan Right hip pain 2/2 falls: Patient had recently fallen twice at home in the last week.  Denies any loss of consciousness with either fall.  CT scan of the head showed no acute intercranial abnormality and x-ray of the pelvis showed no acute fracture.  MRI was unable to be obtained.  Patient previously had right hip replacement several years ago by Dr. [name]. -Admit to medical telemetry bed -Hydrocodone as needed for pain -Check right femur x-rays -Check CT scan of the pelvis -PT/OT to eval and treat -Transitions of care consulted for possible need of rehab/home health -Touch base with Ortho depending imaging findings.  Paroxysmal atrial fibrillation on chronic anticoagulation: Patient appears to be in sinus rhythm at this time.  He is on Xarelto.   -Continue amiodarone and Xarelto -Check EKG -Follow-up telemetry  Hypothyroidism: TSH was 0.69 on 07/31/2020 -Continue levothyroxine  Chronic kidney disease stage IIIa: Stable E1.55 which appears around patient's baseline. -Recheck kidney function in a.m.  Diet-controlled  diabetes mellitus type 2: On admission glucose 110.  Was 110.  Patient appears to be diet controlled.  Last hemoglobin A1c was 5.9 in 02/2020. -Carb modified diet -Continue to monitor and consider placing on sliding scale of insulin if needed  Hyperlipidemia -Continue Crestor  Thrombocytopenia: Chronic. -Continue to monitor  DNR: Patient confirmed DNR status  DVT prophylaxis: Xarelto Code Status: DNR Family Communication: Son updated over the phone Disposition Plan: To be determined Consults called:None Admission status: Observation  Norval Morton MD [facility]   If 7PM-7AM, please contact night-coverage   09/21/2020, 3:43 PM

## 2020-09-21 NOTE — ED Provider Notes (Signed)
[facility] EMERGENCY DEPARTMENT Provider Note   CSN: [id number] Arrival date & time: 09/20/20  1719     History Chief Complaint  Patient presents with   Leonard Primas Francom Sr. is a 87 y.o. male.  Patient presents to the emergency department for evaluation of right hip pain from a fall.  Patient fell 3 days ago.  He was initially able to walk but now cannot put any weight on the leg.  He reports that it it feels like it will not hold him.  Pain worsens with trying to bear weight.  Pain is in the posterior aspect of the hip.      Past Medical History:  Diagnosis Date   Anemia    Anxiety    Arrhythmia    PAROXYSMAL ATRIAL FIBRILLATION   Arthritis    OSTEOARTHRITIS   Cataracts, bilateral    Chronic back pain    Coronary artery disease 8/11   CABG...LM EMERGENT WITH IABP sees Dr. Legrand Como cooper   DJD (degenerative joint disease)    DVT (deep venous thrombosis) (HCC)    Dyspnea    GERD (gastroesophageal reflux disease)    History of BPH    Hyperlipidemia    Hypertension    Hypothyroidism    Iron deficiency anemia due to chronic blood loss 06/29/2019   Lumbar post-laminectomy syndrome    Myocardial infarction [facility])    Pulmonary embolism ([place]) 2009   hx of   RBBB (right bundle branch block)    Renal artery stenosis (HCC)    Thrombocytopenia ([place])    Thyroid disease    HYPOTHYROIDISM   Type 2 diabetes mellitus without complication, without long-term current use of insulin ([place]) 07/28/2019   Unstable angina ([place])    NONE IN LONG TIME    Patient Active Problem List   Diagnosis Date Noted   Right hip pain 09/21/2020   Dark brown urine 04/22/2020   Type 2 diabetes mellitus without complication, without long-term current use of insulin ([place]) 07/28/2019   Iron deficiency anemia due to chronic blood loss 06/29/2019   GIB (gastrointestinal bleeding) 05/18/2019   Constipation 04/19/2018   Medicare annual wellness visit, subsequent 11/19/2015    Spondylolisthesis of lumbar region 08/08/2015   Renal failure 05/23/2015   Pulmonary embolism ([place]) 06/03/2014   Occlusion and stenosis of carotid artery without mention of cerebral infarction 01/27/2012   Renal artery atherosclerosis ([place]) 07/16/2010   CAROTID BRUIT 02/11/2010   Mixed hyperlipidemia 12/17/2009   Hypothyroidism 12/12/2009   ANEMIA 12/12/2009   THROMBOCYTOPENIA 12/12/2009   Essential hypertension 12/12/2009   UNSTABLE ANGINA 12/12/2009   Coronary atherosclerosis of native coronary artery 12/12/2009   RIGHT BUNDLE BRANCH BLOCK 12/12/2009   PAROXYSMAL ATRIAL FIBRILLATION 12/12/2009   Osteoarthritis 12/12/2009   BPH (benign prostatic hyperplasia) 12/12/2009    Past Surgical History:  Procedure Laterality Date   Blood clot  Feb.20,  2016   Pulmonary Embolism   CARDIOVERSION N/A 03/15/2019   Procedure: CARDIOVERSION;  Surgeon: Pixie Casino, MD;  Location: [place];  Service: Cardiovascular;  Laterality: N/A;   CAROTID ENDARTERECTOMY Right Oct. 17, 2013   CE   CAROTID ENDARTERECTOMY Left Dec. 3, 2016   CE   CATARACT EXTRACTION  05/2014,06/2014   COLONOSCOPY WITH PROPOFOL N/A 05/20/2019   Procedure: COLONOSCOPY WITH PROPOFOL;  Surgeon: [name]., MD;  Location: [facility] ENDOSCOPY;  Service: Gastroenterology;  Laterality: N/A;   CORONARY ARTERY BYPASS GRAFT  11-21-09   EMERGENT X 3  GRAFTING. ..[name], MD, cc: [name]   ENDARTERECTOMY  01/28/2012   Procedure: ENDARTERECTOMY CAROTID;  Surgeon: [name], MD;  Location: [place];  Service: Vascular;  Laterality: Right;  with Primary Closure of Artery   ENDARTERECTOMY  03/15/2012   Procedure: ENDARTERECTOMY CAROTID;  Surgeon: [name], MD;  Location: [place];  Service: Vascular;  Laterality: Left;   ENTEROSCOPY N/A 05/23/2019   Procedure: ENTEROSCOPY;  Surgeon: [name], MD;  Location: [place];  Service: Endoscopy;  Laterality: N/A;   ESOPHAGOGASTRODUODENOSCOPY N/A  05/20/2019   Procedure: ESOPHAGOGASTRODUODENOSCOPY (EGD);  Surgeon: [name]., MD;  Location: [place];  Service: Gastroenterology;  Laterality: N/A;   ESOPHAGOGASTRODUODENOSCOPY (EGD) WITH PROPOFOL N/A 05/19/2019   Procedure: ESOPHAGOGASTRODUODENOSCOPY (EGD) WITH PROPOFOL;  Surgeon: [name], MD;  Location: [place];  Service: Endoscopy;  Laterality: N/A;   FOOT SURGERY     GIVENS CAPSULE STUDY N/A 05/20/2019   Procedure: GIVENS CAPSULE STUDY;  Surgeon: [name]., MD;  Location: [place];  Service: Gastroenterology;  Laterality: N/A;   HOT HEMOSTASIS N/A 05/23/2019   Procedure: HOT HEMOSTASIS (ARGON PLASMA COAGULATION/BICAP);  Surgeon: [name], MD;  Location: [place];  Service: Endoscopy;  Laterality: N/A;   MANDIBLE SURGERY     MAXIMUM ACCESS (MAS)POSTERIOR LUMBAR INTERBODY FUSION (PLIF) 2 LEVEL N/A 08/08/2015   Procedure: Lumbar Four-Five, Lumbar Five-Sacral One Maximum access posterior lumbar interbody fusion;  Surgeon: [name], MD;  Location: [place] NEURO ORS;  Service: Neurosurgery;  Laterality: N/A;  LUMBAR FOUR-FIVE ,LUMBAR FIVE -SACRAL Maximum access posterior lumbar interbody fusion   POLYPECTOMY  05/20/2019   Procedure: POLYPECTOMY;  Surgeon: [name], Telford Nab., MD;  Location: [place];  Service: Gastroenterology;;   RENAL ARTERY STENT  2010   REPLACEMENT TOTAL KNEE  2006   RIGHT   SPINAL CORD STIMULATOR INSERTION N/A 06/18/2017   Procedure: LUMBAR SPINAL CORD STIMULATOR INSERTION;  Surgeon: [name], MD;  Location: [place];  Service: Neurosurgery;  Laterality: N/A;  LUMBAR SPINAL CORD STIMULATOR INSERTION   TOTAL HIP ARTHROPLASTY     right       Family History  Problem Relation Age of Onset   Lung cancer Father 98       deceased    Cancer Father 69       LUNG   Stroke Mother 39       DECEASED    Hyperlipidemia Mother    Heart attack Brother 1       deceased   Diabetes Brother    Liver cancer Sister 76        deceased    Cancer Sister     Social History   Tobacco Use   Smoking status: Former    Pack years: 0.00    Types: Cigarettes    Quit date: 04/13/1974    Years since quitting: 46.4   Smokeless tobacco: Former    Types: Chew    Quit date: 01/26/2002   Tobacco comments:    He has smoked about 27-pack-year hx   Vaping Use   Vaping Use: Never used  Substance Use Topics   Alcohol use: Not Currently    Alcohol/week: 2.0 standard drinks    Types: 2 Cans of beer per week    Comment: He used to drink more heavily , but rarely drinks at the current time   Drug use: No    Home Medications Prior to Admission medications   Medication Sig Start Date End Date Taking? Authorizing  Provider  amiodarone (PACERONE) 200 MG tablet Take 1 tablet (200 mg total) by mouth daily. Patient taking differently: Take 200 mg by mouth in the morning. 06/28/20 06/23/21 Yes [name], MD  cetirizine (ZYRTEC) 10 MG tablet TAKE 1 TABLET BY MOUTH ONCE DAILY FOR ALLERGIES. 08/28/20  Yes [name], NP  ferrous sulfate 324 MG TBEC Take 324 mg by mouth at bedtime.   Yes [provider]  furosemide (LASIX) 40 MG tablet Take 40 mg by mouth in the morning. 07/30/20  Yes [provider]  levothyroxine (SYNTHROID) 100 MCG tablet TAKE 1 TABLET BY MOUTH ON EMPTY STOMACH WITH WATER ONLY ON SUNDAY THROUGH FRIDAY. TAKE 1 & 1/2 TABS SATURDAY ONLY.NO FOOD OR MEDS FOR 30 MINUTES AFTER. Patient taking differently: TAKE 1 TABLET BY MOUTH ON EMPTY STOMACH WITH WATER ONLY ON SUNDAY THROUGH FRIDAY. TAKE 1 & 1/2 TABS Saturday ONLY.NO FOOD OR MEDS FOR 30 MINUTES AFTER. 09/19/20  Yes [name], NP  Rivaroxaban (XARELTO) 15 MG TABS tablet Take 1 tablet (15 mg total) by mouth daily with supper. 08/19/20  Yes [name], NP  rosuvastatin (CRESTOR) 10 MG tablet TAKE ONE TABLET BY MOUTH IN THE EVENING FOR CHOLESTEROL. 08/19/20  Yes [name], NP  vitamin B-12 (CYANOCOBALAMIN) 1000 MCG tablet TAKE 1  TABLET BY MOUTH ONCE DAILY 05/13/20  Yes Earlie Server, MD  docusate sodium (COLACE) 100 MG capsule Take 2 capsules (200 mg total) by mouth daily. Patient not taking: Reported on 09/21/2020 05/25/19   Barb Merino, MD  LINZESS 145 MCG CAPS capsule Take 1 capsule (145 mcg total) by mouth daily before breakfast. Patient not taking: No sig reported 12/22/19   [name], NP  tamsulosin (FLOMAX) 0.4 MG CAPS capsule Take 1 capsule (0.4 mg total) by mouth daily. For urine flow. Patient not taking: Reported on 09/21/2020 05/02/20   [name], NP    Allergies    Patient has no known allergies.  Review of Systems   Review of Systems  Musculoskeletal:  Positive for arthralgias.  All other systems reviewed and are negative.  Physical Exam Updated Vital Signs BP 135/85 (BP Location: Right Arm)   Pulse 78   Temp 97.8 F (36.6 C) (Oral)   Resp 16   SpO2 98%   Physical Exam Vitals and nursing note reviewed.  Constitutional:      General: He is not in acute distress.    Appearance: Normal appearance. He is well-developed.  HENT:     Head: Normocephalic and atraumatic.     Right Ear: Hearing normal.     Left Ear: Hearing normal.     Nose: Nose normal.  Eyes:     Conjunctiva/sclera: Conjunctivae normal.     Pupils: Pupils are equal, round, and reactive to light.  Cardiovascular:     Rate and Rhythm: Regular rhythm.     Heart sounds: S1 normal and S2 normal. No murmur heard.   No friction rub. No gallop.  Pulmonary:     Effort: Pulmonary effort is normal. No respiratory distress.     Breath sounds: Normal breath sounds.  Chest:     Chest wall: No tenderness.  Abdominal:     General: Bowel sounds are normal.     Palpations: Abdomen is soft.     Tenderness: There is no abdominal tenderness. There is no guarding or rebound. Negative signs include Murphy's sign and McBurney's sign.     Hernia: No hernia is present.  Musculoskeletal:  Cervical back: Normal range of motion and  neck supple.     Right hip: No deformity. Decreased range of motion.  Skin:    General: Skin is warm and dry.     Findings: No rash.  Neurological:     Mental Status: He is alert and oriented to person, place, and time.     GCS: GCS eye subscore is 4. GCS verbal subscore is 5. GCS motor subscore is 6.     Cranial Nerves: No cranial nerve deficit.     Sensory: No sensory deficit.     Coordination: Coordination normal.  Psychiatric:        Speech: Speech normal.        Behavior: Behavior normal.        Thought Content: Thought content normal.    ED Results / Procedures / Treatments   Labs (all labs ordered are listed, but only abnormal results are displayed) Labs Reviewed  CBC - Abnormal; Notable for the following components:      Result Value   MCV 101.9 (*)    Platelets 141 (*)    All other components within normal limits  BASIC METABOLIC PANEL - Abnormal; Notable for the following components:   Glucose, Bld 110 (*)    Creatinine, Ser 1.55 (*)    Calcium 8.8 (*)    GFR, Estimated 43 (*)    All other components within normal limits  RESP PANEL BY RT-PCR (FLU A&B, COVID) ARPGX2    EKG None  Radiology CT Head Wo Contrast  Result Date: 09/20/2020 CLINICAL DATA:  Worsening balance issues over the past months. EXAM: CT HEAD WITHOUT CONTRAST TECHNIQUE: Contiguous axial images were obtained from the base of the skull through the vertex without intravenous contrast. COMPARISON:  None. FINDINGS: Brain: Advanced cerebral and cerebellar atrophy. Left larger than right remote cerebellar hemispheric infarcts. Cortically based hypoattenuation within the right occipital lobe including on 22/3 is without significant mass effect, favored to represent a remote infarct. No mass lesion, hemorrhage, hydrocephalus, acute infarct, intra-axial, or extra-axial fluid collection. Vascular: Intracranial atherosclerosis. Skull: No significant soft tissue swelling.  No skull fracture. Sinuses/Orbits:  Normal imaged portions of the orbits and globes. The left maxillary sinus is expanded and filled with heterogeneously hyperattenuating material, likely due to chronic sinusitis and inspissated secretions. This extends into the left ethmoid air cells. This was present but progressive compared to the CTA of the neck of 01/26/2012. Clear mastoid air cells. Other: None. IMPRESSION: 1.  No acute intracranial abnormality. 2. Bilateral cerebellar lacunar infarcts. Right occipital hypoattenuation is favored to relate to a remote PCA infarct. 3. Advanced cerebral and cerebellar atrophy. 4. Progressive chronic sinusitis involving the left maxillary sinus. Electronically Signed   By: Abigail Miyamoto M.D.   On: 09/20/2020 18:41   DG Hip Unilat W or Wo Pelvis 2-3 Views Right  Result Date: 09/20/2020 CLINICAL DATA:  Several falls this week. Right hip pain. EXAM: DG HIP (WITH OR WITHOUT PELVIS) 2-3V RIGHT COMPARISON:  None. FINDINGS: The right hip prosthesis is intact. No periprosthetic fracture is identified. The pubic symphysis and SI joints are intact. No pelvic fractures. The left hip is intact. Lumbar spine surgical hardware noted. Extensive vascular calcifications. IMPRESSION: No acute bony findings. Electronically Signed   By: Marijo Sanes M.D.   On: 09/20/2020 18:30    Procedures Procedures   Medications Ordered in ED Medications  amiodarone (PACERONE) tablet 200 mg (has no administration in time range)  Rivaroxaban (XARELTO) tablet 15 mg (  has no administration in time range)  furosemide (LASIX) tablet 40 mg (has no administration in time range)  sodium chloride flush (NS) 0.9 % injection 3 mL (has no administration in time range)  ondansetron (ZOFRAN) tablet 4 mg (has no administration in time range)    Or  ondansetron (ZOFRAN) injection 4 mg (has no administration in time range)  acetaminophen (TYLENOL) tablet 650 mg (has no administration in time range)    Or  acetaminophen (TYLENOL) suppository 650 mg  (has no administration in time range)  albuterol (PROVENTIL) (2.5 MG/3ML) 0.083% nebulizer solution 2.5 mg (has no administration in time range)  rosuvastatin (CRESTOR) tablet 10 mg (has no administration in time range)  levothyroxine (SYNTHROID) tablet 100-150 mcg (has no administration in time range)  HYDROcodone-acetaminophen (NORCO/VICODIN) 5-325 MG per tablet 1 tablet (has no administration in time range)  morphine 4 MG/ML injection 4 mg (4 mg Intravenous Given 09/21/20 0836)  ondansetron (ZOFRAN) injection 4 mg (4 mg Intravenous Given 09/21/20 0998)    ED Course  I have reviewed the triage vital signs and the nursing notes.  Pertinent labs & imaging results that were available during my care of the patient were reviewed by me and considered in my medical decision making (see chart for details).    MDM Rules/Calculators/A&P                          Patient presents to the emergency department for evaluation of right hip pain after a fall.  Fall actually occurred 2 days ago.  Initially he was able to walk but now cannot bear any weight.  X-ray of hip is negative.  CT head unremarkable.  I do not feel that CT will be helpful because of the significant artifact scatter from the hip replacement.  I did attempt to perform MRI to further evaluate soft tissue and hip, but he cannot get an MRI because of his spinal stimulator.  Physical therapy has evaluated the patient and recommend admission.  Final Clinical Impression(s) / ED Diagnoses Final diagnoses:  Contusion of right hip, initial encounter    Rx / DC Orders ED Discharge Orders     None        Orpah Greek, MD 09/21/20 1617

## 2020-09-21 NOTE — Progress Notes (Signed)
Pt has stimulator [id number] with leads [id number]. Based off of the manufacture recommendations pt would be eligible for a head scan only in a T/R head coil, if his leads were 50cm. Hoever, his leads are 70cm which make him ineligible for any MRIs. Dr. Betsey Holiday is aware. OK to cancel MRI orders.

## 2020-09-22 DIAGNOSIS — E1122 Type 2 diabetes mellitus with diabetic chronic kidney disease: Secondary | ICD-10-CM | POA: Diagnosis present

## 2020-09-22 DIAGNOSIS — I252 Old myocardial infarction: Secondary | ICD-10-CM | POA: Diagnosis not present

## 2020-09-22 DIAGNOSIS — S72111A Displaced fracture of greater trochanter of right femur, initial encounter for closed fracture: Secondary | ICD-10-CM | POA: Diagnosis present

## 2020-09-22 DIAGNOSIS — E782 Mixed hyperlipidemia: Secondary | ICD-10-CM | POA: Diagnosis present

## 2020-09-22 DIAGNOSIS — I4510 Unspecified right bundle-branch block: Secondary | ICD-10-CM | POA: Diagnosis present

## 2020-09-22 DIAGNOSIS — I2510 Atherosclerotic heart disease of native coronary artery without angina pectoris: Secondary | ICD-10-CM | POA: Diagnosis present

## 2020-09-22 DIAGNOSIS — Z8249 Family history of ischemic heart disease and other diseases of the circulatory system: Secondary | ICD-10-CM | POA: Diagnosis not present

## 2020-09-22 DIAGNOSIS — I480 Paroxysmal atrial fibrillation: Secondary | ICD-10-CM | POA: Diagnosis present

## 2020-09-22 DIAGNOSIS — W19XXXA Unspecified fall, initial encounter: Secondary | ICD-10-CM | POA: Diagnosis present

## 2020-09-22 DIAGNOSIS — I129 Hypertensive chronic kidney disease with stage 1 through stage 4 chronic kidney disease, or unspecified chronic kidney disease: Secondary | ICD-10-CM | POA: Diagnosis present

## 2020-09-22 DIAGNOSIS — E039 Hypothyroidism, unspecified: Secondary | ICD-10-CM | POA: Diagnosis present

## 2020-09-22 DIAGNOSIS — Z83438 Family history of other disorder of lipoprotein metabolism and other lipidemia: Secondary | ICD-10-CM | POA: Diagnosis not present

## 2020-09-22 DIAGNOSIS — K219 Gastro-esophageal reflux disease without esophagitis: Secondary | ICD-10-CM | POA: Diagnosis present

## 2020-09-22 DIAGNOSIS — D696 Thrombocytopenia, unspecified: Secondary | ICD-10-CM | POA: Diagnosis present

## 2020-09-22 DIAGNOSIS — N400 Enlarged prostate without lower urinary tract symptoms: Secondary | ICD-10-CM | POA: Diagnosis present

## 2020-09-22 DIAGNOSIS — M25551 Pain in right hip: Secondary | ICD-10-CM | POA: Diagnosis present

## 2020-09-22 DIAGNOSIS — Z951 Presence of aortocoronary bypass graft: Secondary | ICD-10-CM | POA: Diagnosis not present

## 2020-09-22 DIAGNOSIS — E038 Other specified hypothyroidism: Secondary | ICD-10-CM | POA: Diagnosis not present

## 2020-09-22 DIAGNOSIS — N1831 Chronic kidney disease, stage 3a: Secondary | ICD-10-CM | POA: Diagnosis present

## 2020-09-22 DIAGNOSIS — S0081XA Abrasion of other part of head, initial encounter: Secondary | ICD-10-CM | POA: Diagnosis present

## 2020-09-22 DIAGNOSIS — Z66 Do not resuscitate: Secondary | ICD-10-CM | POA: Diagnosis present

## 2020-09-22 DIAGNOSIS — M961 Postlaminectomy syndrome, not elsewhere classified: Secondary | ICD-10-CM | POA: Diagnosis present

## 2020-09-22 DIAGNOSIS — Z86711 Personal history of pulmonary embolism: Secondary | ICD-10-CM | POA: Diagnosis not present

## 2020-09-22 DIAGNOSIS — M1990 Unspecified osteoarthritis, unspecified site: Secondary | ICD-10-CM | POA: Diagnosis present

## 2020-09-22 DIAGNOSIS — Z20822 Contact with and (suspected) exposure to covid-19: Secondary | ICD-10-CM | POA: Diagnosis present

## 2020-09-22 DIAGNOSIS — F419 Anxiety disorder, unspecified: Secondary | ICD-10-CM | POA: Diagnosis present

## 2020-09-22 DIAGNOSIS — G8929 Other chronic pain: Secondary | ICD-10-CM | POA: Diagnosis present

## 2020-09-22 LAB — CBC
HCT: 40.8 % (ref 39.0–52.0)
Hemoglobin: 13 g/dL (ref 13.0–17.0)
MCH: 32.4 pg (ref 26.0–34.0)
MCHC: 31.9 g/dL (ref 30.0–36.0)
MCV: 101.7 fL — ABNORMAL HIGH (ref 80.0–100.0)
Platelets: 141 10*3/uL — ABNORMAL LOW (ref 150–400)
RBC: 4.01 MIL/uL — ABNORMAL LOW (ref 4.22–5.81)
RDW: 12.5 % (ref 11.5–15.5)
WBC: 6.3 10*3/uL (ref 4.0–10.5)
nRBC: 0.3 % — ABNORMAL HIGH (ref 0.0–0.2)

## 2020-09-22 LAB — BASIC METABOLIC PANEL
Anion gap: 12 (ref 5–15)
BUN: 20 mg/dL (ref 8–23)
CO2: 21 mmol/L — ABNORMAL LOW (ref 22–32)
Calcium: 8.4 mg/dL — ABNORMAL LOW (ref 8.9–10.3)
Chloride: 106 mmol/L (ref 98–111)
Creatinine, Ser: 1.5 mg/dL — ABNORMAL HIGH (ref 0.61–1.24)
GFR, Estimated: 45 mL/min — ABNORMAL LOW (ref >60)
Glucose, Bld: 88 mg/dL (ref 70–99)
Potassium: 5.8 mmol/L — ABNORMAL HIGH (ref 3.5–5.1)
Sodium: 139 mmol/L (ref 135–145)

## 2020-09-22 LAB — VITAMIN B12: Vitamin B-12: 561 pg/mL (ref 180–914)

## 2020-09-22 LAB — SURGICAL PCR SCREEN
MRSA, PCR: NEGATIVE
Staphylococcus aureus: NEGATIVE

## 2020-09-22 LAB — POTASSIUM: Potassium: 3.6 mmol/L (ref 3.5–5.1)

## 2020-09-22 MED ORDER — MUPIROCIN 2 % EX OINT: 1.0000 "application " | TOPICAL_OINTMENT | Freq: Two times a day (BID) | CUTANEOUS | Status: DC

## 2020-09-22 MED ORDER — SODIUM ZIRCONIUM CYCLOSILICATE 10 G PO PACK
10.0000 g | PACK | Freq: Once | ORAL | Status: AC
  Administered 2020-09-22: 10 g via ORAL
  Filled 2020-09-22: qty 1

## 2020-09-22 NOTE — [place] FL2 (Signed)
[place] MEDICAID FL2 LEVEL OF CARE SCREENING TOOL     IDENTIFICATION  Patient Name: Leonard GERTNER Sr. Birthdate: Feb 14, 1930 Sex: male Admission Date (Current Location): 09/20/2020  [facility] and [place] Number:  Herbalist and Address:  [facility] [place], [place] [address], [place], [place] [address]      Provider Number: [id number]  Attending Physician Name and Address:  Shelly Coss, MD  Relative Name and Phone Number:       Current Level of Care: Hospital Recommended Level of Care: [place] Prior Approval Number:    Date Approved/Denied:   PASRR Number: [id number]  Discharge Plan: SNF    Current Diagnoses: Patient Active Problem List   Diagnosis Date Noted   Right hip pain 09/21/2020   Falls, initial encounter 09/21/2020   Dark brown urine 04/22/2020   Type 2 diabetes mellitus without complication, without long-term current use of insulin ([place]) 07/28/2019   Iron deficiency anemia due to chronic blood loss 06/29/2019   GIB (gastrointestinal bleeding) 05/18/2019   Constipation 04/19/2018   Medicare annual wellness visit, subsequent 11/19/2015   Spondylolisthesis of lumbar region 08/08/2015   Renal failure 05/23/2015   Pulmonary embolism ([place]) 06/03/2014   Occlusion and stenosis of carotid artery without mention of cerebral infarction 01/27/2012   Renal artery atherosclerosis ([place]) 07/16/2010   CAROTID BRUIT 02/11/2010   Mixed hyperlipidemia 12/17/2009   Hypothyroidism 12/12/2009   ANEMIA 12/12/2009   THROMBOCYTOPENIA 12/12/2009   Essential hypertension 12/12/2009   UNSTABLE ANGINA 12/12/2009   Coronary atherosclerosis of native coronary artery 12/12/2009   RIGHT BUNDLE BRANCH BLOCK 12/12/2009   PAROXYSMAL ATRIAL FIBRILLATION 12/12/2009   Osteoarthritis 12/12/2009   BPH (benign prostatic hyperplasia) 12/12/2009    Orientation RESPIRATION BLADDER Height & Weight     Self, Time, Situation, Place     Continent Weight: 216 lb 7.9 oz (98.2 kg) Height:  6\' 2"  (188 cm)  BEHAVIORAL SYMPTOMS/MOOD NEUROLOGICAL BOWEL NUTRITION STATUS      Continent Diet (See DC summary)  AMBULATORY STATUS COMMUNICATION OF NEEDS Skin   Extensive Assist   Normal                       Personal Care Assistance Level of Assistance  Bathing, Feeding, Dressing Bathing Assistance: Maximum assistance Feeding assistance: Limited assistance Dressing Assistance: Maximum assistance     Functional Limitations Info             SPECIAL CARE FACTORS FREQUENCY  OT (By licensed OT), PT (By licensed PT)     PT Frequency: 5x a week OT Frequency: 5x a week            Contractures Contractures Info: Not present    Additional Factors Info  Code Status, Allergies Code Status Info: DNR Allergies Info: NKA           Current Medications (09/22/2020):  This is the current hospital active medication list Current Facility-Administered Medications  Medication Dose Route Frequency Provider Last Rate Last Admin   acetaminophen (TYLENOL) tablet 650 mg  650 mg Oral Q6H PRN Norval Morton, MD       Or   acetaminophen (TYLENOL) suppository 650 mg  650 mg Rectal Q6H PRN Fuller Plan A, MD       albuterol (PROVENTIL) (2.5 MG/3ML) 0.083% nebulizer solution 2.5 mg  2.5 mg Nebulization Q6H PRN Smith, Rondell A, MD       amiodarone (PACERONE) tablet 200 mg  200 mg  Oral q AM Fuller Plan A, MD   200 mg at 09/22/20 0853   furosemide (LASIX) tablet 40 mg  40 mg Oral q AM Fuller Plan A, MD   40 mg at 09/22/20 0853   HYDROcodone-acetaminophen (NORCO/VICODIN) 5-325 MG per tablet 1 tablet  1 tablet Oral Q6H PRN Norval Morton, MD   1 tablet at 09/22/20 0234   levothyroxine (SYNTHROID) tablet 100 mcg  100 mcg Oral Once per day on Sun Mon Tue Wed Thu Fri Fuller Plan A, MD   100 mcg at 09/22/20 0523   [START ON 09/28/2020] levothyroxine (SYNTHROID) tablet 150 mcg  150 mcg Oral Once per day on Sat Chen, Lydia D, RPH        ondansetron [facility]) tablet 4 mg  4 mg Oral Q6H PRN Norval Morton, MD       Or   ondansetron (ZOFRAN) injection 4 mg  4 mg Intravenous Q6H PRN Fuller Plan A, MD       rosuvastatin (CRESTOR) tablet 10 mg  10 mg Oral QPM Smith, Rondell A, MD   10 mg at 09/21/20 1759   sodium chloride flush (NS) 0.9 % injection 3 mL  3 mL Intravenous Q12H Fuller Plan A, MD   3 mL at 09/22/20 0855     Discharge Medications: Please see discharge summary for a list of discharge medications.  Relevant Imaging Results:  Relevant Lab Results:   Additional Information SSN; [id number], covid vaccinated with booster  Emeterio Reeve, LCSWA

## 2020-09-22 NOTE — Progress Notes (Signed)
PT Cancellation Note  Patient Details Name: Leonard Berg Sr. MRN: [id number] DOB: Aug 15, 1929   Cancelled Treatment:    Reason Eval/Treat Not Completed: Other (comment). Pt with CT scan that is now showing a fx of R LE, will await clarification on mobility and WB'ing prior to mobilizing pt further.   Moishe Spice, PT, DPT Acute Rehabilitation Services  Pager: [phone] Office: [place] 09/22/2020, 11:29 AM

## 2020-09-22 NOTE — Progress Notes (Signed)
OT Cancellation Note  Patient Details Name: Leonard WEISENBURGER Sr. MRN: [id number] DOB: 1930-02-16   Cancelled Treatment:     Pt with CT scan that is now showing a fx of RLE, will await clarification on mobility and WB'ing prior to mobilizing pt further.  [place], OTR/L Acute Rehab Services Pager [phone] Office [phone]    Almon Register 09/22/2020, 9:43 AM

## 2020-09-22 NOTE — Progress Notes (Signed)
Patient arrived to 6N10. AX0X4,able to make needs known. No acute distress noted. No SOB/DOB 02sat 94% on room air. VS BP121/ 58 T97.7 RR16 PR56. Has c/o 2/10 Rleg/knee pain- medicated prior transfer. Patient was oriented to call bell and surroundings. Call bell within reach and will continue to monitor

## 2020-09-22 NOTE — TOC Initial Note (Signed)
Transition of Care (TOC) - Initial/Assessment Note    Patient Details  Name: Leonard SCHOLLMEYER Sr. MRN: [id number] Date of Birth: 30-Jan-1930  Transition of Care [facility]) CM/SW Contact:    Emeterio Reeve, [place] Phone Number: 09/22/2020, 2:26 PM  Clinical Narrative:                  CSW met with pt at bedside. CSW introduced self and explained her role at the hospital.  Pt reports he is home alone. [place] Pt reports he was independent with mobility and ADL's but uses a rollator on occasion. Pt reports he is covid vaccinated with booster.   CSW gave pt recommendation of SNF by pt/ot. Pt reports him and his son [name] has already discussed SNF and prefers [facility]. T gave csw permission to fax to other facilities in the hub.   TOC will follow.  Expected Discharge Plan: Skilled Nursing Facility Barriers to Discharge: Continued Medical Work up   Patient Goals and CMS Choice Patient states their goals for this hospitalization and ongoing recovery are:: To get better CMS Medicare.gov Compare Post Acute Care list provided to:: Patient Choice offered to / list presented to : Patient  Expected Discharge Plan and Services Expected Discharge Plan: [place] arrangements for the past 2 months: Single Family Home                                      Prior Living Arrangements/Services Living arrangements for the past 2 months: Single Family Home Lives with:: Self Patient language and need for interpreter reviewed:: Yes Do you feel safe going back to the place where you live?: Yes      Need for Family Participation in Patient Care: Yes (Comment) Care giver support system in place?: Yes (comment)   Criminal Activity/Legal Involvement Pertinent to Current Situation/Hospitalization: No - Comment as needed  Activities of Daily Living      Permission Sought/Granted Permission sought to share information with : Facility  Art therapist granted to share information with : Yes, Verbal Permission Granted     Permission granted to share info w AGENCY: SNF        Emotional Assessment Appearance:: Appears stated age Attitude/Demeanor/Rapport: Engaged Affect (typically observed): Appropriate Orientation: : Oriented to Place, Oriented to Self, Oriented to  Time, Oriented to Situation   Psych Involvement: No (comment)  Admission diagnosis:  Right hip pain [M25.551] Contusion of right hip, initial encounter [S70.01XA] Patient Active Problem List   Diagnosis Date Noted   Right hip pain 09/21/2020   Falls, initial encounter 09/21/2020   Dark brown urine 04/22/2020   Type 2 diabetes mellitus without complication, without long-term current use of insulin ([place]) 07/28/2019   Iron deficiency anemia due to chronic blood loss 06/29/2019   GIB (gastrointestinal bleeding) 05/18/2019   Constipation 04/19/2018   Medicare annual wellness visit, subsequent 11/19/2015   Spondylolisthesis of lumbar region 08/08/2015   Renal failure 05/23/2015   Pulmonary embolism ([place]) 06/03/2014   Occlusion and stenosis of carotid artery without mention of cerebral infarction 01/27/2012   Renal artery atherosclerosis ([place]) 07/16/2010   CAROTID BRUIT 02/11/2010   Mixed hyperlipidemia 12/17/2009   Hypothyroidism 12/12/2009   ANEMIA 12/12/2009   THROMBOCYTOPENIA 12/12/2009   Essential hypertension 12/12/2009   UNSTABLE ANGINA 12/12/2009   Coronary atherosclerosis of native  coronary artery 12/12/2009   RIGHT BUNDLE BRANCH BLOCK 12/12/2009   PAROXYSMAL ATRIAL FIBRILLATION 12/12/2009   Osteoarthritis 12/12/2009   BPH (benign prostatic hyperplasia) 12/12/2009   PCP:  [name], NP Pharmacy:   [facility], [place] [address] Phone: [phone] Fax: [phone]     Social Determinants of Health (SDOH) Interventions    Readmission  Risk Interventions No flowsheet data found.  Emeterio Reeve, Latanya Presser, [place] Social Worker [phone]

## 2020-09-22 NOTE — Progress Notes (Signed)
PROGRESS NOTE    Leonard WHEDBEE Sr.  UUV:[id number] DOB: Sep 09, 1929 DOA: 09/20/2020 PCP: [name], NP   Chief Complain: Right knee pain  Brief Narrative: Patient is 87 year old male with history of hypertension, hyperlipidemia, paroxysmal A. fib on anticoagulation, hypothyroidism, chronic thrombocytopenia, DVT who presented with complaints of right leg pain after a fall at home.  There is no report of head injury or loss of consciousness.  He had history of hip replacement on the right side by Dr. [name] several years ago.  On presentation he was hypertensive.  X-ray of the pelvis did not show any fracture but CT scan of the pelvis showed right trochanteric fracture.  Orthopedics consulted today.  Assessment & Plan:   Principal Problem:   Right hip pain Active Problems:   Hypothyroidism   Mixed hyperlipidemia   THROMBOCYTOPENIA   PAROXYSMAL ATRIAL FIBRILLATION   Type 2 diabetes mellitus without complication, without long-term current use of insulin (HCC)   Falls, initial encounter  Right hip fracture: Presented with severe right hip pain after fall at home.  CT showed fracture extending from the right femoral greater trochanter into the proximal posterior right femoral diaphysis.  Continue pain management, supportive care.  SCD for DVT prophylaxis for now.  Xarelto will be held.  Orthopedics consulted.  PT/OT consultation after surgery.  Might need a skilled nursing facility on discharge.  Patient lives by himself.  He uses a walker for ambulation Patient fell at home, no history of loss of consciousness or head injury.  Paroxysmal A. fib: On normal sinus rhythm at present.  On anticoagulation with Xarelto.Also on amiodarone  Hypothyroidism: On Synthyroid  CKD stage IIIa: Currently kidney function at baseline  Hyperkalemia: Given a dose of Lokelma.  Blood sample could be hemolyzed.  We will recheck potassium level later today.  Hyperlipidemia: On  Crestor  Thrombocytopenia: Chronic.  Stable          DVT prophylaxis:SCD Code Status: Full Family Communication:  Status is: Observation    Dispo: The patient is from: Home              Anticipated d/c is to: SNF              Patient currently is not medically stable to d/c.   Difficult to place patient No     Consultants: Orthopedics  Procedures:None  Antimicrobials:  Anti-infectives (From admission, onward)    None       Subjective:  Patient seen and examined at the bedside this afternoon.  Hemodynamically stable.  Overall comfortable.  Eating his lunch.  Complains of pain on the right hip only when he moves.   Objective: Vitals:   09/21/20 1547 09/21/20 1741 09/21/20 1931 09/22/20 0346  BP: 135/85 (!) 147/67 (!) 119/57 (!) 121/58  Pulse: 78 63 63 (!) 56  Resp: 16 14 17 16   Temp: 97.8 F (36.6 C) 98 F (36.7 C) 98.1 F (36.7 C) 97.7 F (36.5 C)  TempSrc: Oral Oral Oral Oral  SpO2: 98% 99% 93% 94%  Weight:   98.2 kg   Height:   6\' 2"  (1.88 m)     Intake/Output Summary (Last 24 hours) at 09/22/2020 1154 Last data filed at 09/22/2020 1000 Gross per 24 hour  Intake 340 ml  Output 475 ml  Net -135 ml   Filed Weights   09/21/20 1931  Weight: 98.2 kg    Examination:  General exam: Appears calm and comfortable ,Not in distress, pleasant elderly male  HEENT:PERRL,Oral mucosa moist, Ear/Nose normal on gross exam Respiratory system: Bilateral equal air entry, normal vesicular breath sounds, no wheezes or crackles  Cardiovascular system: S1 & S2 heard, RRR. No JVD, murmurs, rubs, gallops or clicks. No pedal edema. Gastrointestinal system: Abdomen is nondistended, soft and nontender. No organomegaly or masses felt. Normal bowel sounds heard. Central nervous system: Alert and oriented. No focal neurological deficits. Extremities: No edema, no clubbing ,no cyanosis, tenderness on the right hip skin: No rashes, lesions or ulcers,no icterus ,no  pallor   Data Reviewed: I have personally reviewed following labs and imaging studies  CBC: Recent Labs  Lab 09/21/20 0834 09/22/20 0156  WBC 7.5 6.3  HGB 13.9 13.0  HCT 43.9 40.8  MCV 101.9* 101.7*  PLT 141* 664*   Basic Metabolic Panel: Recent Labs  Lab 09/21/20 0834 09/22/20 0156  NA 138 139  K 4.1 5.8*  CL 106 106  CO2 25 21*  GLUCOSE 110* 88  BUN 18 20  CREATININE 1.55* 1.50*  CALCIUM 8.8* 8.4*   GFR: Estimated Creatinine Clearance: 39.6 mL/min (A) (by C-G formula based on SCr of 1.5 mg/dL (H)). Liver Function Tests: No results for input(s): AST, ALT, ALKPHOS, BILITOT, PROT, ALBUMIN in the last 168 hours. No results for input(s): LIPASE, AMYLASE in the last 168 hours. No results for input(s): AMMONIA in the last 168 hours. Coagulation Profile: No results for input(s): INR, PROTIME in the last 168 hours. Cardiac Enzymes: Recent Labs  Lab 09/21/20 1916  CKTOTAL 72   BNP (last 3 results) No results for input(s): PROBNP in the last 8760 hours. HbA1C: No results for input(s): HGBA1C in the last 72 hours. CBG: No results for input(s): GLUCAP in the last 168 hours. Lipid Profile: No results for input(s): CHOL, HDL, LDLCALC, TRIG, CHOLHDL, LDLDIRECT in the last 72 hours. Thyroid Function Tests: No results for input(s): TSH, T4TOTAL, FREET4, T3FREE, THYROIDAB in the last 72 hours. Anemia Panel: Recent Labs    09/22/20 0156  VITAMINB12 561   Sepsis Labs: No results for input(s): PROCALCITON, LATICACIDVEN in the last 168 hours.  Recent Results (from the past 240 hour(s))  Resp Panel by RT-PCR (Flu A&B, Covid) Nasopharyngeal Swab     Status: None   Collection Time: 09/21/20 12:58 PM   Specimen: Nasopharyngeal Swab; Nasopharyngeal(NP) swabs in vial transport medium  Result Value Ref Range Status   SARS Coronavirus 2 by RT PCR NEGATIVE NEGATIVE Final    Comment: (NOTE) SARS-CoV-2 target nucleic acids are NOT DETECTED.  The SARS-CoV-2 RNA is generally  detectable in upper respiratory specimens during the acute phase of infection. The lowest concentration of SARS-CoV-2 viral copies this assay can detect is 138 copies/mL. A negative result does not preclude SARS-Cov-2 infection and should not be used as the sole basis for treatment or other patient management decisions. A negative result may occur with  improper specimen collection/handling, submission of specimen other than nasopharyngeal swab, presence of viral mutation(s) within the areas targeted by this assay, and inadequate number of viral copies(<138 copies/mL). A negative result must be combined with clinical observations, patient history, and epidemiological information. The expected result is Negative.  Fact Sheet for Patients:  EntrepreneurPulse.com.au  Fact Sheet for Healthcare Providers:  IncredibleEmployment.be  This test is no t yet approved or cleared by the [place] FDA and  has been authorized for detection and/or diagnosis of SARS-CoV-2 by FDA under an Emergency Use Authorization (EUA). This EUA will remain  in effect (meaning this test can be  used) for the duration of the COVID-19 declaration under Section 564(b)(1) of the Act, 21 U.S.C.section 360bbb-3(b)(1), unless the authorization is terminated  or revoked sooner.       Influenza A by PCR NEGATIVE NEGATIVE Final   Influenza B by PCR NEGATIVE NEGATIVE Final    Comment: (NOTE) The Xpert Xpress SARS-CoV-2/FLU/RSV plus assay is intended as an aid in the diagnosis of influenza from Nasopharyngeal swab specimens and should not be used as a sole basis for treatment. Nasal washings and aspirates are unacceptable for Xpert Xpress SARS-CoV-2/FLU/RSV testing.  Fact Sheet for Patients: EntrepreneurPulse.com.au  Fact Sheet for Healthcare Providers: IncredibleEmployment.be  This test is not yet approved or cleared by the [place] FDA  and has been authorized for detection and/or diagnosis of SARS-CoV-2 by FDA under an Emergency Use Authorization (EUA). This EUA will remain in effect (meaning this test can be used) for the duration of the COVID-19 declaration under Section 564(b)(1) of the Act, 21 U.S.C. section 360bbb-3(b)(1), unless the authorization is terminated or revoked.  Performed at [facility], [place] [address] [place], [place] [address]          Radiology Studies: CT Head Wo Contrast  Result Date: 09/20/2020 CLINICAL DATA:  Worsening balance issues over the past months. EXAM: CT HEAD WITHOUT CONTRAST TECHNIQUE: Contiguous axial images were obtained from the base of the skull through the vertex without intravenous contrast. COMPARISON:  None. FINDINGS: Brain: Advanced cerebral and cerebellar atrophy. Left larger than right remote cerebellar hemispheric infarcts. Cortically based hypoattenuation within the right occipital lobe including on 22/3 is without significant mass effect, favored to represent a remote infarct. No mass lesion, hemorrhage, hydrocephalus, acute infarct, intra-axial, or extra-axial fluid collection. Vascular: Intracranial atherosclerosis. Skull: No significant soft tissue swelling.  No skull fracture. Sinuses/Orbits: Normal imaged portions of the orbits and globes. The left maxillary sinus is expanded and filled with heterogeneously hyperattenuating material, likely due to chronic sinusitis and inspissated secretions. This extends into the left ethmoid air cells. This was present but progressive compared to the CTA of the neck of 01/26/2012. Clear mastoid air cells. Other: None. IMPRESSION: 1.  No acute intracranial abnormality. 2. Bilateral cerebellar lacunar infarcts. Right occipital hypoattenuation is favored to relate to a remote PCA infarct. 3. Advanced cerebral and cerebellar atrophy. 4. Progressive chronic sinusitis involving the left maxillary sinus. Electronically Signed   By: Abigail Miyamoto M.D.   On: 09/20/2020 18:41   CT PELVIS WO CONTRAST  Result Date: 09/21/2020 CLINICAL DATA:  Pelvic pain.  Fracture suspected. EXAM: CT PELVIS WITHOUT CONTRAST TECHNIQUE: Multidetector CT imaging of the pelvis was performed following the standard protocol without intravenous contrast. COMPARISON:  X-ray FINDINGS: Urinary Tract:  No abnormality visualized. Bowel:  Unremarkable visualized pelvic bowel loops. Vascular/Lymphatic: Calcified atherosclerosis in the distal abdominal aorta, iliac vessels, and femoral vessels. No adenopathy identified. Reproductive:  No mass or other significant abnormality Other:  None. Musculoskeletal: There is a fracture involving the right femoral greater trochanter. Evaluation of the fracture is somewhat limited due to streak artifact off the right hip replacement. The fracture extends into the proximal femoral diaphysis posteriorly as seen on series 10, image 45. Pedicle rods and screws are seen in the lumbosacral spine. IMPRESSION: 1. There is a fracture extending from the right femoral greater trochanter into the proximal posterior right femoral diaphysis. 2. No other acute abnormalities. Electronically Signed   By: Dorise Bullion III M.D   On: 09/21/2020 18:28   DG Hip Unilat  W or Wo Pelvis 2-3 Views Right  Result Date: 09/20/2020 CLINICAL DATA:  Several falls this week. Right hip pain. EXAM: DG HIP (WITH OR WITHOUT PELVIS) 2-3V RIGHT COMPARISON:  None. FINDINGS: The right hip prosthesis is intact. No periprosthetic fracture is identified. The pubic symphysis and SI joints are intact. No pelvic fractures. The left hip is intact. Lumbar spine surgical hardware noted. Extensive vascular calcifications. IMPRESSION: No acute bony findings. Electronically Signed   By: Marijo Sanes M.D.   On: 09/20/2020 18:30   DG FEMUR, MIN 2 VIEWS RIGHT  Result Date: 09/21/2020 CLINICAL DATA:  Right hip and leg pain. EXAM: RIGHT FEMUR 2 VIEWS COMPARISON:  Right hip radiographs,  09/20/2020. FINDINGS: The right hip prosthesis is intact. Evidence of remote trauma involving the right femoral shaft with a remote healed fracture. Right knee prosthesis is noted. There is of the/subtle lucency involving the greater trochanter. CT may be helpful for further evaluation and to exclude a fracture. IMPRESSION: 1. Intact right hip prosthesis. 2. Possible subtle lucency involving the greater trochanter. CT may be helpful for further evaluation and to exclude a fracture. 3. Remote posttraumatic changes involving the right femoral shaft. No acute femur fracture. Electronically Signed   By: Marijo Sanes M.D.   On: 09/21/2020 17:04        Scheduled Meds:  amiodarone  200 mg Oral q AM   furosemide  40 mg Oral q AM   levothyroxine  100 mcg Oral Once per day on Sun Mon Tue Wed Thu Fri   [START ON 09/28/2020] levothyroxine  150 mcg Oral Once per day on Sat   Rivaroxaban  15 mg Oral Q supper   rosuvastatin  10 mg Oral QPM   sodium chloride flush  3 mL Intravenous Q12H   Continuous Infusions:   LOS: 0 days    Time spent:25 mins. More than 50% of that time was spent in counseling and/or coordination of care.      Shelly Coss, MD [facility] P6/03/2021, 11:54 AM

## 2020-09-23 DIAGNOSIS — M25551 Pain in right hip: Secondary | ICD-10-CM | POA: Diagnosis not present

## 2020-09-23 LAB — BASIC METABOLIC PANEL
Anion gap: 7 (ref 5–15)
BUN: 17 mg/dL (ref 8–23)
CO2: 26 mmol/L (ref 22–32)
Calcium: 8.2 mg/dL — ABNORMAL LOW (ref 8.9–10.3)
Chloride: 104 mmol/L (ref 98–111)
Creatinine, Ser: 1.67 mg/dL — ABNORMAL HIGH (ref 0.61–1.24)
GFR, Estimated: 39 mL/min — ABNORMAL LOW (ref >60)
Glucose, Bld: 139 mg/dL — ABNORMAL HIGH (ref 70–99)
Potassium: 3.9 mmol/L (ref 3.5–5.1)
Sodium: 137 mmol/L (ref 135–145)

## 2020-09-23 MED ORDER — RIVAROXABAN 15 MG PO TABS
15.0000 mg | ORAL_TABLET | Freq: Every day | ORAL | Status: DC
  Administered 2020-09-23 – 2020-09-26 (×4): 15 mg via ORAL
  Filled 2020-09-23 (×4): qty 1

## 2020-09-23 MED ORDER — POLYETHYLENE GLYCOL 3350 17 G PO PACK
17.0000 g | PACK | Freq: Every day | ORAL | Status: DC
  Administered 2020-09-24 – 2020-09-27 (×4): 17 g via ORAL
  Filled 2020-09-23 (×4): qty 1

## 2020-09-23 MED ORDER — OXYCODONE HCL 5 MG PO TABS
5.0000 mg | ORAL_TABLET | Freq: Four times a day (QID) | ORAL | Status: DC | PRN
  Administered 2020-09-23: 5 mg via ORAL
  Filled 2020-09-23: qty 1

## 2020-09-23 NOTE — Progress Notes (Signed)
ANTICOAGULATION CONSULT NOTE - Initial Consult  Pharmacy Consult for Xarelto Indication: atrial fibrillation and DVT  No Known Allergies  Patient Measurements: Height: 6\' 2"  (188 cm) Weight: 98.2 kg (216 lb 7.9 oz) IBW/kg (Calculated) : 82.2   Vital Signs: Temp: 98.4 F (36.9 C) (06/13 0418) Temp Source: Oral (06/13 0418) BP: 136/73 (06/13 0905) Pulse Rate: 62 (06/13 0905)  Labs: Recent Labs    09/21/20 0834 09/21/20 1916 09/22/20 0156 09/23/20 0248  HGB 13.9  --  13.0  --   HCT 43.9  --  40.8  --   PLT 141*  --  141*  --   CREATININE 1.55*  --  1.50* 1.67*  CKTOTAL  --  72  --   --     Estimated Creatinine Clearance: 35.5 mL/min (A) (by C-G formula based on SCr of 1.67 mg/dL (H)).   Medical History: Past Medical History:  Diagnosis Date   Anemia    Anxiety    Arrhythmia    PAROXYSMAL ATRIAL FIBRILLATION   Arthritis    OSTEOARTHRITIS   Cataracts, bilateral    Chronic back pain    Coronary artery disease 8/11   CABG...LM EMERGENT WITH IABP sees Dr. Legrand Como cooper   DJD (degenerative joint disease)    DVT (deep venous thrombosis) (HCC)    Dyspnea    GERD (gastroesophageal reflux disease)    History of BPH    Hyperlipidemia    Hypertension    Hypothyroidism    Iron deficiency anemia due to chronic blood loss 06/29/2019   Lumbar post-laminectomy syndrome    Myocardial infarction [facility])    Pulmonary embolism ([place]) 2009   hx of   RBBB (right bundle branch block)    Renal artery stenosis (HCC)    Thrombocytopenia (HCC)    Thyroid disease    HYPOTHYROIDISM   Type 2 diabetes mellitus without complication, without long-term current use of insulin ([place]) 07/28/2019   Unstable angina (HCC)    NONE IN LONG TIME    Assessment: 87 yo admitted with a hip fracture, which will be medically managed. On Xarelto PTA for a fib and DVT history. Pharmacy consulted to restart Xarelto.   Plan:   Restart home dose of Xarelto 15 mg po daily Monitor for signs and  symptoms of bleeding.  Alanda Slim, PharmD, [facility] Clinical Pharmacist Please see AMION for all Pharmacists' Contact Phone Numbers 09/23/2020, 10:22 AM

## 2020-09-23 NOTE — Consult Note (Signed)
Reason for Consult: right periprosthetic greater trochanteric proximal femur fracture Referring Physician: Johnnette Gourd, MD  Leonard [name] is an 87 y.o. male.  HPI: Patient is 87 year old male with history of hypertension, hyperlipidemia, paroxysmal A. fib on anticoagulation, hypothyroidism, chronic thrombocytopenia, DVT who presented with complaints of right leg pain after a fall at home.  There is no report of head injury or loss of consciousness.   He was admitted for pain control.  Given inability to mobilize a CT scan was ordered as plain films were otherwise unremarkable.  CT scan reveals nondisplaced fracture involving his right greater trochanter adjacent to his right femoral component.  He has a history of right total hip replacement by myself several years ago.    Past Medical History:  Diagnosis Date   Anemia    Anxiety    Arrhythmia    PAROXYSMAL ATRIAL FIBRILLATION   Arthritis    OSTEOARTHRITIS   Cataracts, bilateral    Chronic back pain    Coronary artery disease 8/11   CABG...LM EMERGENT WITH IABP sees Dr. Legrand Como cooper   DJD (degenerative joint disease)    DVT (deep venous thrombosis) (HCC)    Dyspnea    GERD (gastroesophageal reflux disease)    History of BPH    Hyperlipidemia    Hypertension    Hypothyroidism    Iron deficiency anemia due to chronic blood loss 06/29/2019   Lumbar post-laminectomy syndrome    Myocardial infarction [facility])    Pulmonary embolism ([place]) 2009   hx of   RBBB (right bundle branch block)    Renal artery stenosis (HCC)    Thrombocytopenia ([place])    Thyroid disease    HYPOTHYROIDISM   Type 2 diabetes mellitus without complication, without long-term current use of insulin ([place]) 07/28/2019   Unstable angina ([place])    NONE IN LONG TIME    Past Surgical History:  Procedure Laterality Date   Blood clot  Feb.20,  2016   Pulmonary Embolism   CARDIOVERSION N/A 03/15/2019   Procedure: CARDIOVERSION;  Surgeon: Pixie Casino, MD;   Location: [place];  Service: Cardiovascular;  Laterality: N/A;   CAROTID ENDARTERECTOMY Right Oct. 17, 2013   CE   CAROTID ENDARTERECTOMY Left Dec. 3, 2016   CE   CATARACT EXTRACTION  05/2014,06/2014   COLONOSCOPY WITH PROPOFOL N/A 05/20/2019   Procedure: COLONOSCOPY WITH PROPOFOL;  Surgeon: [name]., MD;  Location[facility] ENDOSCOPY;  Service: Gastroenterology;  Laterality: N/A;   CORONARY ARTERY BYPASS GRAFT  11-21-09   EMERGENT X 3 GRAFTING. ..[name], MD, cc: [name]   ENDARTERECTOMY  01/28/2012   Procedure: ENDARTERECTOMY CAROTID;  Surgeon: [name], MD;  Location: [place];  Service: Vascular;  Laterality: Right;  with Primary Closure of Artery   ENDARTERECTOMY  03/15/2012   Procedure: ENDARTERECTOMY CAROTID;  Surgeon: [name], MD;  Location: [place];  Service: Vascular;  Laterality: Left;   ENTEROSCOPY N/A 05/23/2019   Procedure: ENTEROSCOPY;  Surgeon: [name], MD;  Location: [place];  Service: Endoscopy;  Laterality: N/A;   ESOPHAGOGASTRODUODENOSCOPY N/A 05/20/2019   Procedure: ESOPHAGOGASTRODUODENOSCOPY (EGD);  Surgeon: [name]., MD;  Location: [place];  Service: Gastroenterology;  Laterality: N/A;   ESOPHAGOGASTRODUODENOSCOPY (EGD) WITH PROPOFOL N/A 05/19/2019   Procedure: ESOPHAGOGASTRODUODENOSCOPY (EGD) WITH PROPOFOL;  Surgeon: [name], MD;  Location: [place];  Service: Endoscopy;  Laterality: N/A;   FOOT SURGERY     GIVENS CAPSULE STUDY N/A 05/20/2019   Procedure: GIVENS CAPSULE  STUDY;  Surgeon: [name]., MD;  Location: [place];  Service: Gastroenterology;  Laterality: N/A;   HOT HEMOSTASIS N/A 05/23/2019   Procedure: HOT HEMOSTASIS (ARGON PLASMA COAGULATION/BICAP);  Surgeon: [name], MD;  Location: [place];  Service: Endoscopy;  Laterality: N/A;   MANDIBLE SURGERY     MAXIMUM ACCESS (MAS)POSTERIOR LUMBAR INTERBODY FUSION (PLIF) 2 LEVEL N/A 08/08/2015   Procedure:  Lumbar Four-Five, Lumbar Five-Sacral One Maximum access posterior lumbar interbody fusion;  Surgeon: [name], MD;  Location: [place] NEURO ORS;  Service: Neurosurgery;  Laterality: N/A;  LUMBAR FOUR-FIVE ,LUMBAR FIVE -SACRAL Maximum access posterior lumbar interbody fusion   POLYPECTOMY  05/20/2019   Procedure: POLYPECTOMY;  Surgeon: [name], Telford Nab., MD;  Location: [place];  Service: Gastroenterology;;   RENAL ARTERY STENT  2010   REPLACEMENT TOTAL KNEE  2006   RIGHT   SPINAL CORD STIMULATOR INSERTION N/A 06/18/2017   Procedure: LUMBAR SPINAL CORD STIMULATOR INSERTION;  Surgeon: [name], MD;  Location: [place];  Service: Neurosurgery;  Laterality: N/A;  LUMBAR SPINAL CORD STIMULATOR INSERTION   TOTAL HIP ARTHROPLASTY     right    Family History  Problem Relation Age of Onset   Lung cancer Father 35       deceased    Cancer Father 52       LUNG   Stroke Mother 11       DECEASED    Hyperlipidemia Mother    Heart attack Brother 71       deceased   Diabetes Brother    Liver cancer Sister 30       deceased    Cancer Sister     Social History:  reports that he quit smoking about 46 years ago. His smoking use included cigarettes. He quit smokeless tobacco use about 18 years ago.  His smokeless tobacco use included chew. He reports previous alcohol use of about 2.0 standard drinks of alcohol per week. He reports that he does not use drugs.  Allergies: No Known Allergies  Medications: I have reviewed the patient's current medications. Scheduled:  amiodarone  200 mg Oral q AM   furosemide  40 mg Oral q AM   levothyroxine  100 mcg Oral Once per day on Sun Mon Tue Wed Thu Fri   [START ON 09/28/2020] levothyroxine  150 mcg Oral Once per day on Sat   rosuvastatin  10 mg Oral QPM   sodium chloride flush  3 mL Intravenous Q12H    Results for orders placed or performed during the hospital encounter of 09/20/20 (from the past 24 hour(s))  Potassium     Status: None   Collection  Time: 09/22/20  1:30 PM  Result Value Ref Range   Potassium 3.6 3.5 - 5.1 mmol/L  Surgical PCR screen     Status: None   Collection Time: 09/22/20  7:40 PM   Specimen: Nasal Mucosa; Nasal Swab  Result Value Ref Range   MRSA, PCR NEGATIVE NEGATIVE   Staphylococcus aureus NEGATIVE NEGATIVE  Basic metabolic panel     Status: Abnormal   Collection Time: 09/23/20  2:48 AM  Result Value Ref Range   Sodium 137 135 - 145 mmol/L   Potassium 3.9 3.5 - 5.1 mmol/L   Chloride 104 98 - 111 mmol/L   CO2 26 22 - 32 mmol/L   Glucose, Bld 139 (H) 70 - 99 mg/dL   BUN 17 8 - 23 mg/dL   Creatinine, Ser 1.67 (H) 0.61 - 1.24  mg/dL   Calcium 8.2 (L) 8.9 - 10.3 mg/dL   GFR, Estimated 39 (L) >60 mL/min   Anion gap 7 5 - 15     X-ray:  CLINICAL DATA:  Right hip and leg pain.   EXAM: RIGHT FEMUR 2 VIEWS   COMPARISON:  Right hip radiographs, 09/20/2020.   FINDINGS: The right hip prosthesis is intact. Evidence of remote trauma involving the right femoral shaft with a remote healed fracture. Right knee prosthesis is noted. There is of the/subtle lucency involving the greater trochanter. CT may be helpful for further evaluation and to exclude a fracture.   IMPRESSION: 1. Intact right hip prosthesis. 2. Possible subtle lucency involving the greater trochanter. CT may be helpful for further evaluation and to exclude a fracture. 3. Remote posttraumatic changes involving the right femoral shaft. No acute femur fracture.     Electronically Signed   By: Marijo Sanes M.D.   CLINICAL DATA:  Pelvic pain.  Fracture suspected.   EXAM: CT PELVIS WITHOUT CONTRAST   TECHNIQUE: Multidetector CT imaging of the pelvis was performed following the standard protocol without intravenous contrast.   COMPARISON:  X-ray   FINDINGS: Urinary Tract:  No abnormality visualized.   Bowel:  Unremarkable visualized pelvic bowel loops.   Vascular/Lymphatic: Calcified atherosclerosis in the distal abdominal  aorta, iliac vessels, and femoral vessels. No adenopathy identified.   Reproductive:  No mass or other significant abnormality   Other:  None.   Musculoskeletal: There is a fracture involving the right femoral greater trochanter. Evaluation of the fracture is somewhat limited due to streak artifact off the right hip replacement. The fracture extends into the proximal femoral diaphysis posteriorly as seen on series 10, image 45.   Pedicle rods and screws are seen in the lumbosacral spine.   IMPRESSION: 1. There is a fracture extending from the right femoral greater trochanter into the proximal posterior right femoral diaphysis. 2. No other acute abnormalities.     Electronically Signed   By: Dorise Bullion III M.D  ROS As related in HPI  Blood pressure 130/67, pulse (!) 55, temperature 98.4 F (36.9 C), temperature source Oral, resp. rate 18, height 6\' 2"  (1.88 m), weight 98.2 kg, SpO2 96 %.  Physical Exam: General exam: Appears calm and comfortable ,Not in distress, pleasant elderly male HEENT:PERRL,Oral mucosa moist, Ear/Nose normal on gross exam Respiratory system: Bilateral equal air entry, normal vesicular breath sounds, no wheezes or crackles Cardiovascular system: S1 & S2 heard, RRR. No JVD, murmurs, rubs, gallops or clicks. No pedal edema. Gastrointestinal system: Abdomen is nondistended, soft and nontender. No organomegaly or masses felt. Normal bowel sounds heard. Central nervous system: Alert and oriented. No focal neurological deficits. Extremities: No edema, no clubbing ,no cyanosis, tenderness on the right hip skin: No rashes, lesions or ulcers,no icterus ,no pallor  Right hip exam: tender to palpation right hip.  Pain with passive and active ROM of hip right hip  Assessment/Plan: Non displaced right periprosthetic fracture involving the greater trochanter adjacent to femoral prosthesis with stable femoral prosthesis without evidence of loosening  Plan: This  fracture will be treated non operatively PWB RLE no active abduction.  PT orders placed RTC in 2 weeks for clinical and radiographic follow up Pain control  Mauri Pole 09/23/2020, 6:34 AM

## 2020-09-23 NOTE — Progress Notes (Signed)
Physical Therapy Treatment Patient Details Name: Leonard CAVANAH Sr. MRN: [id number] DOB: 1930-03-23 Today's Date: 09/23/2020    History of Present Illness 87 y.o. male presented to ED 09/20/20 s/p fall that occurred a few days ago after tripping outside, CT showing fracture extending from the right femoral greater trochanter into the proximal posterior right femoral diaphysis, non operative. He hit his head and is c/o right hip pain. Per son pt is in his normal states of health and has not been any more confused than he normally is. Pt with history of hypertension, hyperlipidemia, paroxysmal A. fib on anticoagulation, hypothyroidism, chronic thrombocytopenia, DVT, and R hip replacement several years ago.    PT Comments    The pt was able to demo goof progression of mobility this session, completing multiple bouts of ambulation in the room with minA-minG and use of RW to maintain WB status. The pt is currently utilizing TDWB/NWB due to pain, but was able to complete multiple sit-stands and 5-8 min standing at sink for self-care. The pt is most limited by need for min-mod physical assist to complete all sit-stand transfers at this time, as well as decreased insight to need for assist, safety, and how to avoid falls (pt reports frequent falls PTA). The pt will benefit from skilled PT to progress safety and mobility, but may need short stint of SNF rehab if family unable to provide assistance/supervision for safety when pt medically ready to d/c. Will continue to assess safety and possibility of progression to d/c home with HHPT.    Follow Up Recommendations  SNF;Supervision/Assistance - 24 hour (vs HHPT pending family ability to provide 24/7 supervision at d/c)     Equipment Recommendations  Rolling walker with 5" wheels    Recommendations for Other Services       Precautions / Restrictions Precautions Precautions: Fall Precaution Comments: pt has frequent falls at home (per  pt) Restrictions Weight Bearing Restrictions: Yes RLE Weight Bearing: Partial weight bearing RLE Partial Weight Bearing Percentage or Pounds: 50 Other Position/Activity Restrictions: no active R hip abduction    Mobility  Bed Mobility Overal bed mobility: Needs Assistance Bed Mobility: Supine to Sit     Supine to sit: Min guard;HOB elevated     General bed mobility comments: pt able to scoop RLE with LLE, then use HOB elevation and bed rails to bring BLE to EOB to L. increased time and effort but no assist    Transfers Overall transfer level: Needs assistance Equipment used: Rolling walker (2 wheeled) Transfers: Sit to/from Stand Sit to Stand: Min assist;Mod assist;From elevated surface         General transfer comment: minA from elevated bed (pt very tall), modA from lower surfaces such as recliner. cues to extend RLE with lower to sitting to reduce WB  Ambulation/Gait Ambulation/Gait assistance: Min assist;Min guard Gait Distance (Feet): 6 Feet (+ 10 ft + 10 ft) Assistive device: Rolling walker (2 wheeled) Gait Pattern/deviations: Step-to pattern;Antalgic Gait velocity: very slow Gait velocity interpretation: <1.31 ft/sec, indicative of household ambulator General Gait Details: heavy reliance on BUE, maintaining TDWB to NWB for RLE due to pain. slowed but steady gait      Balance Overall balance assessment: Needs assistance Sitting-balance support: No upper extremity supported;Feet supported Sitting balance-Leahy Scale: Good     Standing balance support: During functional activity;Single extremity supported Standing balance-Leahy Scale: Fair Standing balance comment: able to static stand with single UE supported while completing self-care tasks at sink, BUE support on RW  to maintain WB status with gait                            Cognition Arousal/Alertness: Awake/alert Behavior During Therapy: WFL for tasks assessed/performed Overall Cognitive  Status: No family/caregiver present to determine baseline cognitive functioning Area of Impairment: Memory;Safety/judgement;Problem solving                     Memory: Decreased recall of precautions;Decreased short-term memory   Safety/Judgement: Decreased awareness of safety;Decreased awareness of deficits   Problem Solving: Requires verbal cues General Comments: pt stating he has frequent falls and also that he often does not heed advice from children in regards to safety with mobility at home. decreased comprehension of impact of need for assistance for safety      Exercises      General Comments General comments (skin integrity, edema, etc.): VSS on RA      Pertinent Vitals/Pain Pain Assessment: Faces Faces Pain Scale: Hurts little more Pain Location: R hip with any mobility Pain Descriptors / Indicators: Shooting;Sore Pain Intervention(s): Limited activity within patient's tolerance;Monitored during session;Repositioned     PT Goals (current goals can now be found in the care plan section) Acute Rehab PT Goals Patient Stated Goal: go somewhere in [place] to get help.Marland KitchenMarland KitchenI can't go up my steps like this PT Goal Formulation: With patient Time For Goal Achievement: 10/05/20 Potential to Achieve Goals: Good Progress towards PT goals: Progressing toward goals    Frequency    Min 5X/week      PT Plan Current plan remains appropriate    Co-evaluation PT/OT/SLP Co-Evaluation/Treatment: Yes Reason for Co-Treatment: For patient/therapist safety;To address functional/ADL transfers PT goals addressed during session: Mobility/safety with mobility;Balance;Proper use of DME;Strengthening/ROM        AM-PAC PT "6 Clicks" Mobility   Outcome Measure  Help needed turning from your back to your side while in a flat bed without using bedrails?: A Little Help needed moving from lying on your back to sitting on the side of a flat bed without using bedrails?: A  Little Help needed moving to and from a bed to a chair (including a wheelchair)?: A Little Help needed standing up from a chair using your arms (e.g., wheelchair or bedside chair)?: A Lot Help needed to walk in hospital room?: A Little Help needed climbing 3-5 steps with a railing? : A Lot 6 Click Score: 16    End of Session Equipment Utilized During Treatment: Gait belt Activity Tolerance: Patient limited by pain Patient left: in chair;with call bell/phone within reach;with chair alarm set Nurse Communication: Mobility status (pt needing breakfast/food order) PT Visit Diagnosis: Other abnormalities of gait and mobility (R26.89);Pain Pain - Right/Left: Right Pain - part of body: Hip;Knee     Time: 1010-1047 PT Time Calculation (min) (ACUTE ONLY): 37 min  Charges:  $Gait Training: 8-22 mins                     Karma Ganja, PT, DPT   Acute Rehabilitation Department Pager #: [phone]   Otho Bellows 09/23/2020, 11:23 AM

## 2020-09-23 NOTE — Progress Notes (Signed)
PROGRESS NOTE    Leonard CHESTNUT Sr.  ONG:[id number] DOB: 1929-05-13 DOA: 09/20/2020 PCP: [name], NP   Chief Complain: Right knee pain  Brief Narrative: Patient is 87 year old male with history of hypertension, hyperlipidemia, paroxysmal A. fib on anticoagulation, hypothyroidism, chronic thrombocytopenia, DVT who presented with complaints of right leg pain after a fall at home.  There is no report of head injury or loss of consciousness.  He had history of hip replacement on the right side by Dr. [name] several years ago.  On presentation he was hypertensive.  X-ray of the pelvis did not show any fracture but CT scan of the pelvis showed right trochanteric fracture.  Orthopedics consulted today.  Assessment & Plan:   Principal Problem:   Right hip pain Active Problems:   Hypothyroidism   Mixed hyperlipidemia   THROMBOCYTOPENIA   PAROXYSMAL ATRIAL FIBRILLATION   Type 2 diabetes mellitus without complication, without long-term current use of insulin (HCC)   Falls, initial encounter  Right hip fracture: Presented with severe right hip pain after fall at home.  CT showed fracture extending from the right femoral greater trochanter into the proximal posterior right femoral diaphysis.  Continue pain management, supportive care.  Ortho has evaluated, non-operative approach - pain control w/ oxy. Miralax ordered - pt/ot - TOC involved, will need SNF  Paroxysmal A. fib: On normal sinus rhythm at present.  On anticoagulation with Xarelto.Also on amiodarone - resume home meds  Hypothyroidism: On Synthyroid  CKD stage IIIa: Currently kidney function at baseline  Hyperkalemia: Resolved - monitor  Hyperlipidemia: On Crestor  Thrombocytopenia: Chronic.  Stable          DVT prophylaxis: rivaroxaban Code Status: Full Family Communication: none @ bedside Status is: inpt    Dispo: The patient is from: Home              Anticipated d/c is to: SNF               Patient currently is not medically stable to d/c.   Difficult to place patient No     Consultants: Orthopedics  Procedures:None  Antimicrobials:  Anti-infectives (From admission, onward)    None       Subjective:  Patient seen and examined at the bedside this afternoon.  Hemodynamically stable.  Overall comfortable except when moves leg. Tolerating diet.   Objective: Vitals:   09/22/20 1315 09/22/20 2119 09/23/20 0418 09/23/20 0905  BP: 130/76 136/67 130/67 136/73  Pulse: 61 (!) 56 (!) 55 62  Resp: 18 18 18    Temp: 98.3 F (36.8 C) 98.2 F (36.8 C) 98.4 F (36.9 C)   TempSrc: Oral Oral Oral   SpO2: 100% 98% 96% 94%  Weight:      Height:        Intake/Output Summary (Last 24 hours) at 09/23/2020 0950 Last data filed at 09/23/2020 0700 Gross per 24 hour  Intake 1280 ml  Output 1050 ml  Net 230 ml   Filed Weights   09/21/20 1931  Weight: 98.2 kg    Examination:  General exam: Appears calm and comfortable ,Not in distress, pleasant elderly male HEENT:PERRL,Oral mucosa moist, Ear/Nose normal on gross exam Respiratory system: Bilateral equal air entry, normal vesicular breath sounds, no wheezes or crackles  Cardiovascular system: S1 & S2 heard, RRR. No JVD, murmurs, rubs, gallops or clicks. No pedal edema. Gastrointestinal system: Abdomen is nondistended, soft and nontender. No organomegaly or masses felt. Normal bowel sounds heard. Central nervous system: Alert  and oriented. No focal neurological deficits. Extremities: No edema, no clubbing ,no cyanosis, tenderness on the right hip skin: No rashes, lesions or ulcers,no icterus ,no pallor   Data Reviewed: I have personally reviewed following labs and imaging studies  CBC: Recent Labs  Lab 09/21/20 0834 09/22/20 0156  WBC 7.5 6.3  HGB 13.9 13.0  HCT 43.9 40.8  MCV 101.9* 101.7*  PLT 141* 254*   Basic Metabolic Panel: Recent Labs  Lab 09/21/20 0834 09/22/20 0156 09/22/20 1330 09/23/20 0248  NA  138 139  --  137  K 4.1 5.8* 3.6 3.9  CL 106 106  --  104  CO2 25 21*  --  26  GLUCOSE 110* 88  --  139*  BUN 18 20  --  17  CREATININE 1.55* 1.50*  --  1.67*  CALCIUM 8.8* 8.4*  --  8.2*   GFR: Estimated Creatinine Clearance: 35.5 mL/min (A) (by C-G formula based on SCr of 1.67 mg/dL (H)). Liver Function Tests: No results for input(s): AST, ALT, ALKPHOS, BILITOT, PROT, ALBUMIN in the last 168 hours. No results for input(s): LIPASE, AMYLASE in the last 168 hours. No results for input(s): AMMONIA in the last 168 hours. Coagulation Profile: No results for input(s): INR, PROTIME in the last 168 hours. Cardiac Enzymes: Recent Labs  Lab 09/21/20 1916  CKTOTAL 72   BNP (last 3 results) No results for input(s): PROBNP in the last 8760 hours. HbA1C: No results for input(s): HGBA1C in the last 72 hours. CBG: No results for input(s): GLUCAP in the last 168 hours. Lipid Profile: No results for input(s): CHOL, HDL, LDLCALC, TRIG, CHOLHDL, LDLDIRECT in the last 72 hours. Thyroid Function Tests: No results for input(s): TSH, T4TOTAL, FREET4, T3FREE, THYROIDAB in the last 72 hours. Anemia Panel: Recent Labs    09/22/20 0156  VITAMINB12 561   Sepsis Labs: No results for input(s): PROCALCITON, LATICACIDVEN in the last 168 hours.  Recent Results (from the past 240 hour(s))  Resp Panel by RT-PCR (Flu A&B, Covid) Nasopharyngeal Swab     Status: None   Collection Time: 09/21/20 12:58 PM   Specimen: Nasopharyngeal Swab; Nasopharyngeal(NP) swabs in vial transport medium  Result Value Ref Range Status   SARS Coronavirus 2 by RT PCR NEGATIVE NEGATIVE Final    Comment: (NOTE) SARS-CoV-2 target nucleic acids are NOT DETECTED.  The SARS-CoV-2 RNA is generally detectable in upper respiratory specimens during the acute phase of infection. The lowest concentration of SARS-CoV-2 viral copies this assay can detect is 138 copies/mL. A negative result does not preclude SARS-Cov-2 infection and  should not be used as the sole basis for treatment or other patient management decisions. A negative result may occur with  improper specimen collection/handling, submission of specimen other than nasopharyngeal swab, presence of viral mutation(s) within the areas targeted by this assay, and inadequate number of viral copies(<138 copies/mL). A negative result must be combined with clinical observations, patient history, and epidemiological information. The expected result is Negative.  Fact Sheet for Patients:  EntrepreneurPulse.com.au  Fact Sheet for Healthcare Providers:  IncredibleEmployment.be  This test is no t yet approved or cleared by the [place] FDA and  has been authorized for detection and/or diagnosis of SARS-CoV-2 by FDA under an Emergency Use Authorization (EUA). This EUA will remain  in effect (meaning this test can be used) for the duration of the COVID-19 declaration under Section 564(b)(1) of the Act, 21 U.S.C.section 360bbb-3(b)(1), unless the authorization is terminated  or revoked sooner.  Influenza A by PCR NEGATIVE NEGATIVE Final   Influenza B by PCR NEGATIVE NEGATIVE Final    Comment: (NOTE) The Xpert Xpress SARS-CoV-2/FLU/RSV plus assay is intended as an aid in the diagnosis of influenza from Nasopharyngeal swab specimens and should not be used as a sole basis for treatment. Nasal washings and aspirates are unacceptable for Xpert Xpress SARS-CoV-2/FLU/RSV testing.  Fact Sheet for Patients: EntrepreneurPulse.com.au  Fact Sheet for Healthcare Providers: IncredibleEmployment.be  This test is not yet approved or cleared by the [place] FDA and has been authorized for detection and/or diagnosis of SARS-CoV-2 by FDA under an Emergency Use Authorization (EUA). This EUA will remain in effect (meaning this test can be used) for the duration of the COVID-19 declaration  under Section 564(b)(1) of the Act, 21 U.S.C. section 360bbb-3(b)(1), unless the authorization is terminated or revoked.  Performed at [facility], [place] [address], [place], [place] [address]   Surgical PCR screen     Status: None   Collection Time: 09/22/20  7:40 PM   Specimen: Nasal Mucosa; Nasal Swab  Result Value Ref Range Status   MRSA, PCR NEGATIVE NEGATIVE Final   Staphylococcus aureus NEGATIVE NEGATIVE Final    Comment: (NOTE) The Xpert SA Assay (FDA approved for NASAL specimens in patients 96 years of age and older), is one component of a comprehensive surveillance program. It is not intended to diagnose infection nor to guide or monitor treatment. Performed at [facility], [place] [address] [place], [place] [address]          Radiology Studies: CT PELVIS WO CONTRAST  Result Date: 09/21/2020 CLINICAL DATA:  Pelvic pain.  Fracture suspected. EXAM: CT PELVIS WITHOUT CONTRAST TECHNIQUE: Multidetector CT imaging of the pelvis was performed following the standard protocol without intravenous contrast. COMPARISON:  X-ray FINDINGS: Urinary Tract:  No abnormality visualized. Bowel:  Unremarkable visualized pelvic bowel loops. Vascular/Lymphatic: Calcified atherosclerosis in the distal abdominal aorta, iliac vessels, and femoral vessels. No adenopathy identified. Reproductive:  No mass or other significant abnormality Other:  None. Musculoskeletal: There is a fracture involving the right femoral greater trochanter. Evaluation of the fracture is somewhat limited due to streak artifact off the right hip replacement. The fracture extends into the proximal femoral diaphysis posteriorly as seen on series 10, image 45. Pedicle rods and screws are seen in the lumbosacral spine. IMPRESSION: 1. There is a fracture extending from the right femoral greater trochanter into the proximal posterior right femoral diaphysis. 2. No other acute abnormalities. Electronically Signed   By: Dorise Bullion III M.D   On: 09/21/2020 18:28   DG FEMUR, MIN 2 VIEWS RIGHT  Result Date: 09/21/2020 CLINICAL DATA:  Right hip and leg pain. EXAM: RIGHT FEMUR 2 VIEWS COMPARISON:  Right hip radiographs, 09/20/2020. FINDINGS: The right hip prosthesis is intact. Evidence of remote trauma involving the right femoral shaft with a remote healed fracture. Right knee prosthesis is noted. There is of the/subtle lucency involving the greater trochanter. CT may be helpful for further evaluation and to exclude a fracture. IMPRESSION: 1. Intact right hip prosthesis. 2. Possible subtle lucency involving the greater trochanter. CT may be helpful for further evaluation and to exclude a fracture. 3. Remote posttraumatic changes involving the right femoral shaft. No acute femur fracture. Electronically Signed   By: Marijo Sanes M.D.   On: 09/21/2020 17:04        Scheduled Meds:  amiodarone  200 mg Oral q AM   furosemide  40  mg Oral q AM   levothyroxine  100 mcg Oral Once per day on Sun Mon Tue Wed Thu Fri   [START ON 09/28/2020] levothyroxine  150 mcg Oral Once per day on Sat   rosuvastatin  10 mg Oral QPM   sodium chloride flush  3 mL Intravenous Q12H   Continuous Infusions:   LOS: 1 day    Time spent:25 mins. More than 50% of that time was spent in counseling and/or coordination of care.      Desma Maxim, MD [facility] P6/13/2022, 9:50 AM

## 2020-09-23 NOTE — Progress Notes (Signed)
Occupational Therapy Evaluation Patient Details Name: Leonard FONTAN Sr. MRN: [id number] DOB: [birth year] Today's Date: 09/23/2020    History of Present Illness 87 y.o. male presented to ED 09/20/20 s/p fall that occurred a few days ago after tripping outside, CT showing fracture extending from the right femoral greater trochanter into the proximal posterior right femoral diaphysis, non operative. He hit his head and is c/o right hip pain. Per son pt is in his normal states of health and has not been any more confused than he normally is. Pt with history of hypertension, hyperlipidemia, paroxysmal A. fib on anticoagulation, hypothyroidism, chronic thrombocytopenia, DVT, and R hip replacement several years ago.   Clinical Impression   Leonard Berg was evaluated s/p the above fall and non operative fx. PTA pt lives alone in a 1 level home with 3 STE, his son provides intermittent assistance. 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 Upon evaluation pt was supervision for bed mobility; mod A +2 for initial boosting into stand, and min A/min guard for functional ambulation with rw. Pt progressed to +1 min guard quickly and benefits from sitting on elevated surfaces due to his height. Pt maintained WB and no abduct throughout session. He stood at sink with rw and groomed and bathed without LOB. Pt benefits from continued OT acutely to progress indep in all ADLs and mobility. Recommend d/c home with 24/7 supervision; however is pt is unable to obtain 24/7 assistance, recommend short SNF stay.    Follow Up Recommendations  Home health OT;Supervision/Assistance - 24 hour (Recommend HHOT - pendign pt's ability to have 24/7 support once d/c home. If not able to have assistacne in the home, consider rec SNF.)    Equipment Recommendations  3 in 1 bedside commode;Other (comment) (rw wtih 2 wheels)       Precautions / Restrictions  Precautions Precautions: Fall Precaution Comments: pt has frequent falls at home (per pt) Restrictions Weight Bearing Restrictions: Yes RLE Weight Bearing: Partial weight bearing RLE Partial Weight Bearing Percentage or Pounds: 50 Other Position/Activity Restrictions: no active R hip abduction      Mobility Bed Mobility Overal bed mobility: Needs Assistance Bed Mobility: Supine to Sit     Supine to sit: Min guard;HOB elevated     General bed mobility comments: pt able to scoop RLE with LLE, then use HOB elevation and bed rails to bring BLE to EOB to L. increased time and effort but no assist    Transfers Overall transfer level: Needs assistance Equipment used: Rolling walker (2 wheeled) Transfers: Sit to/from Stand Sit to Stand: Min assist;Mod assist;From elevated surface         General transfer comment: minA from elevated bed (pt very tall), modA from lower surfaces such as recliner. cues to extend RLE with lower to sitting to reduce WB    Balance Overall balance assessment: Needs assistance Sitting-balance support: No upper extremity supported;Feet supported Sitting balance-Leahy Scale: Good     Standing balance support: During functional activity;Single extremity supported Standing balance-Leahy Scale: Fair Standing balance comment: able to static stand with single UE supported while completing self-care tasks at sink, BUE support on RW to maintain WB status with gait                           ADL either performed or assessed with clinical judgement   ADL Overall  ADL's : Needs assistance/impaired Eating/Feeding: Independent;Sitting   Grooming: Wash/dry hands;Wash/dry face;Oral care;Applying deodorant;Min guard;Standing Grooming Details (indicate cue type and reason): Pt groomed while standing at sink Upper Body Bathing: Min guard;Standing Upper Body Bathing Details (indicate cue type and reason): at sink Lower Body Bathing: Minimal assistance;Sit  to/from stand Lower Body Bathing Details (indicate cue type and reason): pt bathed rear perineal while standign at the sink; no LOB Upper Body Dressing : Set up;Sitting   Lower Body Dressing: Minimal assistance;Sit to/from stand   Toilet Transfer: Min guard;Comfort height toilet;Grab bars;Ambulation;RW   Toileting- Water quality scientist and Hygiene: Min guard;Sit to/from stand       Functional mobility during ADLs: Min guard;+2 for safety/equipment;Rolling walker;Cueing for safety General ADL Comments: min guard throughout for safety; +2 for increased ambulation for safety initally, pt can progress to +1. Assistance for threading LB clothing      Pertinent Vitals/Pain Pain Assessment: Faces Faces Pain Scale: Hurts little more Pain Location: R hip with any mobility Pain Descriptors / Indicators: Shooting;Sore Pain Intervention(s): Limited activity within patient's tolerance     Hand Dominance Right   Extremity/Trunk Assessment Upper Extremity Assessment Upper Extremity Assessment: Overall WFL for tasks assessed   Lower Extremity Assessment Lower Extremity Assessment: Defer to PT evaluation   Cervical / Trunk Assessment Cervical / Trunk Assessment: Normal   Communication Communication Communication: HOH   Cognition Arousal/Alertness: Awake/alert Behavior During Therapy: WFL for tasks assessed/performed Overall Cognitive Status: No family/caregiver present to determine baseline cognitive functioning Area of Impairment: Memory;Safety/judgement;Problem solving                     Memory: Decreased recall of precautions;Decreased short-term memory   Safety/Judgement: Decreased awareness of safety;Decreased awareness of deficits   Problem Solving: Requires verbal cues General Comments: pt stating he has frequent falls and also that he often does not heed advice from children in regards to safety with mobility at home. decreased comprehension of impact of need for  assistance for safety   General Comments  VSS on RA     Home Living Family/patient expects to be discharged to:: Skilled nursing facility Living Arrangements: Alone Available Help at Discharge: Family;Available PRN/intermittently Type of Home: House Home Access: Stairs to enter [employer] of Steps: 3 Entrance Stairs-Rails: Right;Left Home Layout: Two level;Able to live on main level with bedroom/bathroom     Bathroom Shower/Tub: Occupational psychologist: Standard     Home Equipment: Environmental consultant - 4 wheels;Cane - single point;Shower seat          Prior Functioning/Environment Level of Independence: Independent with assistive device(s)        Comments: but having multiple falls; has been using rollator inside since fell and hurt his leg (normally uses no device inside and rollator outside); still drives; 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        OT Problem List: Decreased strength;Decreased range of motion;Decreased activity tolerance;Impaired balance (sitting and/or standing);Decreased safety awareness;Decreased knowledge of use of DME or AE;Decreased knowledge of precautions;Pain      OT Treatment/Interventions: Self-care/ADL training;Therapeutic exercise;Therapeutic activities;Balance training;Patient/family education    OT Goals(Current goals can be found in the care plan section) Acute Rehab OT Goals Patient Stated Goal: to stop falling OT Goal Formulation: With patient Time For Goal Achievement: 09/23/20 Potential to Achieve Goals: Good ADL Goals Pt Will Perform Upper Body Bathing: with modified independence;standing;sitting Pt Will Perform Lower Body Bathing: with modified independence;sit to/from stand Pt Will Perform Upper  Body Dressing: with modified independence Pt Will Perform Lower Body Dressing: with modified independence;sit to/from stand Pt Will Transfer to Toilet: with modified independence;ambulating Pt Will Perform  Toileting - Clothing Manipulation and hygiene: with modified independence;sit to/from stand Pt Will Perform Tub/Shower Transfer: with supervision;ambulating;grab bars;rolling walker Additional ADL Goal #1: Pt will independently verbalize at least 3 fall prevention strategies to prepare for safe transition into the home environment  OT Frequency: Min 2X/week   Barriers to D/C: Decreased caregiver support  Pt lives alone, unsure if can have 24/7 support at home       Co-evaluation PT/OT/SLP Co-Evaluation/Treatment: Yes Reason for Co-Treatment: Complexity of the patient's impairments (multi-system involvement) PT goals addressed during session: Mobility/safety with mobility;Balance;Proper use of DME;Strengthening/ROM OT goals addressed during session: ADL's and self-care;Other (comment) (functional transfers)      AM-PAC OT "6 Clicks" Daily Activity     Outcome Measure Help from another person eating meals?: None Help from another person taking care of personal grooming?: A Little Help from another person toileting, which includes using toliet, bedpan, or urinal?: A Little Help from another person bathing (including washing, rinsing, drying)?: A Little Help from another person to put on and taking off regular upper body clothing?: A Little Help from another person to put on and taking off regular lower body clothing?: A Little 6 Click Score: 19   End of Session Equipment Utilized During Treatment: Gait belt;Rolling walker Nurse Communication: Mobility status  Activity Tolerance: Patient tolerated treatment well Patient left: in chair;with call bell/phone within reach;with chair alarm set  OT Visit Diagnosis: Other abnormalities of gait and mobility (R26.89);Repeated falls (R29.6);History of falling (Z91.81);Pain Pain - Right/Left: Right Pain - part of body: Hip                Time: 1010-1047 OT Time Calculation (min): 37 min Charges:  OT General Charges $OT Visit: 1 Visit OT  Evaluation $OT Eval Moderate Complexity: 1 Mod OT Treatments $Self Care/Home Management : 8-22 mins   Carmichael Burdette A Reola Buckles 09/23/2020, 1:18 PM

## 2020-09-23 NOTE — TOC Progression Note (Signed)
Transition of Care (TOC) - Progression Note    Patient Details  Name: Leonard WICKSTROM Sr. MRN: [id number] Date of Birth: 1929-07-01  Transition of Care [facility]) CM/SW [place], [place] Phone Number: 09/23/2020, 4:12 PM  Clinical Narrative:     CSW spoke to pts son Leonard Berg by phone. CSW gave bed offers. Leonard Berg asked for CSW to fax out to [facility]. Leonard Berg stated he will review list with the patient.   Expected Discharge Plan: [place] Barriers to Discharge: Continued Medical Work up  Expected Discharge Plan and Services Expected Discharge Plan: [place] arrangements for the past 2 months: Single Family Home                                       Social Determinants of Health (SDOH) Interventions    Readmission Risk Interventions No flowsheet data found.  Emeterio Reeve, Latanya Presser, [place] Social Worker [phone]

## 2020-09-24 NOTE — Progress Notes (Signed)
Physical Therapy Treatment Patient Details Name: Leonard KUCHER Sr. MRN: [id number] DOB: 23-Jul-1929 Today's Date: 09/24/2020    History of Present Illness 87 y.o. male presented to ED 09/20/20 s/p fall that occurred a few days ago after tripping outside, CT showing fracture extending from the right femoral greater trochanter into the proximal posterior right femoral diaphysis, non operative. Pt with history of hypertension, hyperlipidemia, paroxysmal A. fib on anticoagulation, hypothyroidism, chronic thrombocytopenia, DVT, and R hip replacement several years ago.    PT Comments    Pt making steady progress with all mobility. Continue to recommend ST-SNF at dc.    Follow Up Recommendations  SNF;Supervision/Assistance - 24 hour     Equipment Recommendations  Rolling walker with 5" wheels    Recommendations for Other Services       Precautions / Restrictions Precautions Precautions: Fall Precaution Comments: pt has frequent falls at home (per pt) Restrictions Weight Bearing Restrictions: Yes RLE Weight Bearing: Partial weight bearing RLE Partial Weight Bearing Percentage or Pounds: 50 Other Position/Activity Restrictions: no active R hip abduction    Mobility  Bed Mobility Overal bed mobility: Needs Assistance Bed Mobility: Supine to Sit     Supine to sit: Min guard;HOB elevated     General bed mobility comments: Uses LLE to pick up RLE and move it off of bed. Incr time and effort but no assistance    Transfers Overall transfer level: Needs assistance Equipment used: Rolling walker (2 wheeled) Transfers: Sit to/from Stand Sit to Stand: Min assist;From elevated surface         General transfer comment: Assist to bring hips up and for balance especially with transitioning hands from bed to walker  Ambulation/Gait Ambulation/Gait assistance: Min guard Gait Distance (Feet): 60 Feet Assistive device: Rolling walker (2 wheeled) Gait Pattern/deviations: Step-to  pattern;Antalgic;Decreased stance time - right Gait velocity: decr Gait velocity interpretation: <1.31 ft/sec, indicative of household ambulator General Gait Details: Assist for safety. Heavy reliance on UE's   Stairs             Wheelchair Mobility    Modified Rankin (Stroke Patients Only)       Balance Overall balance assessment: Needs assistance Sitting-balance support: No upper extremity supported;Feet supported Sitting balance-Leahy Scale: Good     Standing balance support: During functional activity;Single extremity supported Standing balance-Leahy Scale: Poor Standing balance comment: walker and min guard for static standing                            Cognition Arousal/Alertness: Awake/alert Behavior During Therapy: WFL for tasks assessed/performed Overall Cognitive Status: History of cognitive impairments - at baseline                                 General Comments: Pt reports baseline memory deficits      Exercises      General Comments        Pertinent Vitals/Pain Pain Assessment: Faces Faces Pain Scale: Hurts little more Pain Location: R hip with any mobility Pain Descriptors / Indicators: Sore;Grimacing;Guarding Pain Intervention(s): Limited activity within patient's tolerance;Monitored during session;Repositioned    Home Living Family/patient expects to be discharged to:: Skilled nursing facility Living Arrangements: Alone                  Prior Function            PT Goals (current  goals can now be found in the care plan section) Acute Rehab PT Goals Patient Stated Goal: go to rehab and then home Progress towards PT goals: Progressing toward goals    Frequency    Min 3X/week      PT Plan Current plan remains appropriate;Frequency needs to be updated    Co-evaluation              AM-PAC PT "6 Clicks" Mobility   Outcome Measure  Help needed turning from your back to your side while  in a flat bed without using bedrails?: A Little Help needed moving from lying on your back to sitting on the side of a flat bed without using bedrails?: A Little Help needed moving to and from a bed to a chair (including a wheelchair)?: A Little Help needed standing up from a chair using your arms (e.g., wheelchair or bedside chair)?: A Lot Help needed to walk in hospital room?: A Little Help needed climbing 3-5 steps with a railing? : A Lot 6 Click Score: 16    End of Session Equipment Utilized During Treatment: Gait belt Activity Tolerance: Patient tolerated treatment well Patient left: in chair;with call bell/phone within reach;with chair alarm set Nurse Communication: Mobility status PT Visit Diagnosis: Other abnormalities of gait and mobility (R26.89);Pain Pain - Right/Left: Right Pain - part of body: Hip;Knee     Time: 9373-4287 PT Time Calculation (min) (ACUTE ONLY): 19 min  Charges:  $Gait Training: 8-22 mins                     [place] Pager [phone] Office [place] 09/24/2020, 12:53 PM

## 2020-09-24 NOTE — Telephone Encounter (Signed)
Yes, okay to wait, especially since he's going to rehab. At the time of the initial call patient was not hospitalized.

## 2020-09-24 NOTE — Telephone Encounter (Signed)
Called son, he is in hospital now and will go to rehab after. No surgery needed at this time. Would like [place] to go forward with [facility]. Only questions for me is if he is going to rehab do we need to hold off to get their recommendations? It might be covered better if they order? Im not sure

## 2020-09-24 NOTE — TOC Progression Note (Signed)
Transition of Care (TOC) - Progression Note    Patient Details  Name: Leonard HAYNESWORTH Sr. MRN: [id number] Date of Birth: 04/21/1929  Transition of Care [facility]) CM/SW [place], [place] Phone Number: 09/24/2020, 1:48 PM  Clinical Narrative:     CSW reached out to [place] resources to review pt in the hub. CSW waiting for reply.   Expected Discharge Plan: [place] Barriers to Discharge: Continued Medical Work up  Expected Discharge Plan and Services Expected Discharge Plan: [place] arrangements for the past 2 months: Single Family Home                                       Social Determinants of Health (SDOH) Interventions    Readmission Risk Interventions No flowsheet data found.  Emeterio Reeve, Latanya Presser, [place] Social Worker [phone]

## 2020-09-24 NOTE — Progress Notes (Signed)
PROGRESS NOTE    Leonard Berg Sr.  OVZ:[id number] DOB: 17-May-1929 DOA: 09/20/2020 PCP: [name], NP     Brief Narrative: Patient is 87 year old male with history of hypertension, hyperlipidemia, paroxysmal A. fib on anticoagulation, hypothyroidism, chronic thrombocytopenia, DVT who presented with complaints of right leg pain after a fall at home.  There is no report of head injury or loss of consciousness.  He had history of hip replacement on the right side by Dr. [name] several years ago.  On presentation he was hypertensive.  X-ray of the pelvis did not show any fracture but CT scan of the pelvis showed right trochanteric fracture.  Orthopedics consulted, non-operative approach recommended.    Assessment & Plan:   Principal Problem:   Right hip pain Active Problems:   Hypothyroidism   Mixed hyperlipidemia   THROMBOCYTOPENIA   PAROXYSMAL ATRIAL FIBRILLATION   Type 2 diabetes mellitus without complication, without long-term current use of insulin (HCC)   Falls, initial encounter   Right hip fracture CT showed fracture extending from the right femoral greater trochanter into the proximal posterior right femoral diaphysis Ortho has evaluated, non-operative approach Continue pain management, supportive care   PT/OT rec SNF, TOC on board (patient lives alone, and will require SNF for rehab)  Paroxysmal A. Fib Rate controlled Continue with amiodarone and Xarelto  Hypothyroidism Continue Synthroid  CKD stage IIIa Currently kidney function at baseline  Hyperlipidemia On Crestor  Thrombocytopenia Chronic.  Stable   DVT prophylaxis: Rivaroxaban Code Status: DNR Family Communication: None Status is: inpt    Dispo: The patient is from: Home              Anticipated d/c is to: SNF              Patient currently is medically stable to d/c.   Difficult to place patient No     Consultants: Orthopedics  Procedures:None  Antimicrobials:   Anti-infectives (From admission, onward)    None       Subjective: Patient seen and examined at bedside, reports some pain on the right hip on ambulation, denies any other new complaints.  Patient lives alone and would require SNF for rehab.    Objective: Vitals:   09/23/20 1501 09/23/20 2112 09/24/20 0448 09/24/20 1320  BP: (!) 141/70 (!) 128/57 131/63 (!) 118/52  Pulse: 60 60 (!) 57 61  Resp: 18 16 17 20   Temp: 97.7 F (36.5 C) 97.9 F (36.6 C) 98.2 F (36.8 C) 98.1 F (36.7 C)  TempSrc: Oral  Oral Oral  SpO2: 98% 96% 96% 96%  Weight:      Height:        Intake/Output Summary (Last 24 hours) at 09/24/2020 1523 Last data filed at 09/24/2020 0900 Gross per 24 hour  Intake 580 ml  Output 600 ml  Net -20 ml   Filed Weights   09/21/20 1931  Weight: 98.2 kg    Examination: General: NAD  Cardiovascular: S1, S2 present Respiratory: CTAB Abdomen: Soft, nontender, nondistended, bowel sounds present Musculoskeletal: No bilateral pedal edema noted Skin: Normal Psychiatry: Normal mood     Data Reviewed: I have personally reviewed following labs and imaging studies  CBC: Recent Labs  Lab 09/21/20 0834 09/22/20 0156  WBC 7.5 6.3  HGB 13.9 13.0  HCT 43.9 40.8  MCV 101.9* 101.7*  PLT 141* 412*   Basic Metabolic Panel: Recent Labs  Lab 09/21/20 0834 09/22/20 0156 09/22/20 1330 09/23/20 0248  NA 138 139  --  137  K 4.1 5.8* 3.6 3.9  CL 106 106  --  104  CO2 25 21*  --  26  GLUCOSE 110* 88  --  139*  BUN 18 20  --  17  CREATININE 1.55* 1.50*  --  1.67*  CALCIUM 8.8* 8.4*  --  8.2*   GFR: Estimated Creatinine Clearance: 35.5 mL/min (A) (by C-G formula based on SCr of 1.67 mg/dL (H)). Liver Function Tests: No results for input(s): AST, ALT, ALKPHOS, BILITOT, PROT, ALBUMIN in the last 168 hours. No results for input(s): LIPASE, AMYLASE in the last 168 hours. No results for input(s): AMMONIA in the last 168 hours. Coagulation Profile: No results for  input(s): INR, PROTIME in the last 168 hours. Cardiac Enzymes: Recent Labs  Lab 09/21/20 1916  CKTOTAL 72   BNP (last 3 results) No results for input(s): PROBNP in the last 8760 hours. HbA1C: No results for input(s): HGBA1C in the last 72 hours. CBG: No results for input(s): GLUCAP in the last 168 hours. Lipid Profile: No results for input(s): CHOL, HDL, LDLCALC, TRIG, CHOLHDL, LDLDIRECT in the last 72 hours. Thyroid Function Tests: No results for input(s): TSH, T4TOTAL, FREET4, T3FREE, THYROIDAB in the last 72 hours. Anemia Panel: Recent Labs    09/22/20 0156  VITAMINB12 561   Sepsis Labs: No results for input(s): PROCALCITON, LATICACIDVEN in the last 168 hours.  Recent Results (from the past 240 hour(s))  Resp Panel by RT-PCR (Flu A&B, Covid) Nasopharyngeal Swab     Status: None   Collection Time: 09/21/20 12:58 PM   Specimen: Nasopharyngeal Swab; Nasopharyngeal(NP) swabs in vial transport medium  Result Value Ref Range Status   SARS Coronavirus 2 by RT PCR NEGATIVE NEGATIVE Final    Comment: (NOTE) SARS-CoV-2 target nucleic acids are NOT DETECTED.  The SARS-CoV-2 RNA is generally detectable in upper respiratory specimens during the acute phase of infection. The lowest concentration of SARS-CoV-2 viral copies this assay can detect is 138 copies/mL. A negative result does not preclude SARS-Cov-2 infection and should not be used as the sole basis for treatment or other patient management decisions. A negative result may occur with  improper specimen collection/handling, submission of specimen other than nasopharyngeal swab, presence of viral mutation(s) within the areas targeted by this assay, and inadequate number of viral copies(<138 copies/mL). A negative result must be combined with clinical observations, patient history, and epidemiological information. The expected result is Negative.  Fact Sheet for Patients:  EntrepreneurPulse.com.au  Fact  Sheet for Healthcare Providers:  IncredibleEmployment.be  This test is no t yet approved or cleared by the [place] FDA and  has been authorized for detection and/or diagnosis of SARS-CoV-2 by FDA under an Emergency Use Authorization (EUA). This EUA will remain  in effect (meaning this test can be used) for the duration of the COVID-19 declaration under Section 564(b)(1) of the Act, 21 U.S.C.section 360bbb-3(b)(1), unless the authorization is terminated  or revoked sooner.       Influenza A by PCR NEGATIVE NEGATIVE Final   Influenza B by PCR NEGATIVE NEGATIVE Final    Comment: (NOTE) The Xpert Xpress SARS-CoV-2/FLU/RSV plus assay is intended as an aid in the diagnosis of influenza from Nasopharyngeal swab specimens and should not be used as a sole basis for treatment. Nasal washings and aspirates are unacceptable for Xpert Xpress SARS-CoV-2/FLU/RSV testing.  Fact Sheet for Patients: EntrepreneurPulse.com.au  Fact Sheet for Healthcare Providers: IncredibleEmployment.be  This test is not yet approved or cleared by the [place]  FDA and has been authorized for detection and/or diagnosis of SARS-CoV-2 by FDA under an Emergency Use Authorization (EUA). This EUA will remain in effect (meaning this test can be used) for the duration of the COVID-19 declaration under Section 564(b)(1) of the Act, 21 U.S.C. section 360bbb-3(b)(1), unless the authorization is terminated or revoked.  Performed at [facility], [place] [address] [place], [place] [address]   Surgical PCR screen     Status: None   Collection Time: 09/22/20  7:40 PM   Specimen: Nasal Mucosa; Nasal Swab  Result Value Ref Range Status   MRSA, PCR NEGATIVE NEGATIVE Final   Staphylococcus aureus NEGATIVE NEGATIVE Final    Comment: (NOTE) The Xpert SA Assay (FDA approved for NASAL specimens in patients 31 years of age and older), is one component of a  comprehensive surveillance program. It is not intended to diagnose infection nor to guide or monitor treatment. Performed at [facility], [place] [address] [place],  [address]          Radiology Studies: No results found.      Scheduled Meds:  amiodarone  200 mg Oral q AM   furosemide  40 mg Oral q AM   levothyroxine  100 mcg Oral Once per day on Sun Mon Tue Wed Thu Fri   [START ON 09/28/2020] levothyroxine  150 mcg Oral Once per day on Sat   polyethylene glycol  17 g Oral Daily   Rivaroxaban  15 mg Oral Q supper   rosuvastatin  10 mg Oral QPM   sodium chloride flush  3 mL Intravenous Q12H   Continuous Infusions:   LOS: 2 days     Alma Friendly, MD [facility] 09/24/2020, 3:23 PM

## 2020-09-25 LAB — SARS CORONAVIRUS 2 (TAT 6-24 HRS): SARS Coronavirus 2: NEGATIVE

## 2020-09-25 MED ORDER — POLYETHYLENE GLYCOL 3350 17 G PO PACK: 17.0000 g | PACK | Freq: Every day | ORAL | 0 refills | Status: DC

## 2020-09-25 NOTE — TOC Progression Note (Signed)
Transition of Care (TOC) - Progression Note    Patient Details  Name: Leonard CATHEY Sr. MRN: [id number] Date of Birth: 01-04-1930  Transition of Care [facility]) CM/SW [place], [place] Phone Number: 09/25/2020, 1:05 PM  Clinical Narrative:     CSW reached out to [facility] multiple times concerning bed offer. CSW started insurance auth with [employer]. CSW requested covid test. MD estimates pt will be ready to DC tomorrow. Pt and son are aware of tomorrows pending discharge.  Expected Discharge Plan: [place] Barriers to Discharge: Continued Medical Work up  Expected Discharge Plan and Services Expected Discharge Plan: [place] arrangements for the past 2 months: Single Family Home Expected Discharge Date: 09/25/20                                     Social Determinants of Health (SDOH) Interventions    Readmission Risk Interventions No flowsheet data found.  Emeterio Reeve, Latanya Presser, [place] Social Worker [phone]

## 2020-09-25 NOTE — Progress Notes (Signed)
Occupational Therapy Treatment Patient Details Name: Leonard LOHR Sr. MRN: [id number] DOB: 08-20-1929 Today's Date: 09/25/2020    History of present illness 87 y.o. male presented to ED 09/20/20 s/p fall that occurred a few days ago after tripping outside, CT showing fracture extending from the right femoral greater trochanter into the proximal posterior right femoral diaphysis, non operative. Pt with history of hypertension, hyperlipidemia, paroxysmal A. fib on anticoagulation, hypothyroidism, chronic thrombocytopenia, DVT, and R hip replacement several years ago.   OT comments  Pt making progress towards OT goals. Pt requested to ambulate this session after toileting and grooming at the sink. Pt working on placing R heel on the ground with mobility to increase amount of weight pt is placing on his RLE. At this time pt can only tolerate touch down weight bearing in the RLE, reporting it sends his pain up to a 10/10. Pt was able to ambulate over 100 ft this session, holding most of his weight in BUE when walking. Acute OT will continue to follow to assist with progressing pt's goals.    Follow Up Recommendations  SNF;Supervision/Assistance - 24 hour    Equipment Recommendations  3 in 1 bedside commode (RW)    Recommendations for Other Services      Precautions / Restrictions Precautions Precautions: Fall;Other (comment) (Hip fx) Restrictions Weight Bearing Restrictions: Yes RLE Weight Bearing: Partial weight bearing RLE Partial Weight Bearing Percentage or Pounds: 50 Other Position/Activity Restrictions: no active R hip abduction       Mobility Bed Mobility Overal bed mobility: Needs Assistance Bed Mobility: Supine to Sit;Sit to Supine     Supine to sit: Min guard;HOB elevated Sit to supine: Min guard;HOB elevated   General bed mobility comments: Uses LLE to pick up RLE and move it off of bed. Incr time and effort but no assistance    Transfers Overall transfer level:  Needs assistance Equipment used: Rolling walker (2 wheeled) Transfers: Sit to/from Stand Sit to Stand: Min guard;From elevated surface         General transfer comment: Bed elevated where pt was able to stand with no assist.    Balance Overall balance assessment: Mild deficits observed, not formally tested                                         ADL either performed or assessed with clinical judgement   ADL Overall ADL's : Needs assistance/impaired     Grooming: Wash/dry hands;Supervision/safety;Standing Grooming Details (indicate cue type and reason): Washed hands at sink                 Toilet Transfer: Supervision/safety;Ambulation;Comfort height toilet           Functional mobility during ADLs: Supervision/safety;Rolling walker General ADL Comments: Supervision for safety     Vision       Perception     Praxis      Cognition Arousal/Alertness: Awake/alert Behavior During Therapy: WFL for tasks assessed/performed Overall Cognitive Status: History of cognitive impairments - at baseline                                 General Comments: Pt reports baseline memory deficits        Exercises     Shoulder Instructions       General Comments VSS on  RA    Pertinent Vitals/ Pain       Pain Assessment: 0-10 Pain Score: 3  Pain Location: R hip with any mobility Pain Descriptors / Indicators: Sore;Grimacing;Guarding Pain Intervention(s): Monitored during session;Repositioned  Home Living                                          Prior Functioning/Environment              Frequency  Min 2X/week        Progress Toward Goals  OT Goals(current goals can now be found in the care plan section)  Progress towards OT goals: Progressing toward goals  Acute Rehab OT Goals Patient Stated Goal: go to rehab and then home OT Goal Formulation: With patient Time For Goal Achievement:  10/07/20 Potential to Achieve Goals: Good ADL Goals Pt Will Perform Upper Body Bathing: with modified independence;standing;sitting Pt Will Perform Lower Body Bathing: with modified independence;sit to/from stand Pt Will Perform Upper Body Dressing: with modified independence Pt Will Perform Lower Body Dressing: with modified independence;sit to/from stand Pt Will Transfer to Toilet: with modified independence;ambulating Pt Will Perform Toileting - Clothing Manipulation and hygiene: with modified independence;sit to/from stand Pt Will Perform Tub/Shower Transfer: with supervision;ambulating;grab bars;rolling walker Additional ADL Goal #1: Pt will independently verbalize at least 3 fall prevention strategies to prepare for safe transition into the home environment  Plan Discharge plan remains appropriate;Frequency remains appropriate    Co-evaluation                 AM-PAC OT "6 Clicks" Daily Activity     Outcome Measure   Help from another person eating meals?: None Help from another person taking care of personal grooming?: A Little Help from another person toileting, which includes using toliet, bedpan, or urinal?: A Little Help from another person bathing (including washing, rinsing, drying)?: A Little Help from another person to put on and taking off regular upper body clothing?: A Little Help from another person to put on and taking off regular lower body clothing?: A Little 6 Click Score: 19    End of Session Equipment Utilized During Treatment: Rolling walker  OT Visit Diagnosis: Other abnormalities of gait and mobility (R26.89);Repeated falls (R29.6);History of falling (Z91.81);Pain Pain - Right/Left: Right Pain - part of body: Hip   Activity Tolerance Patient tolerated treatment well   Patient Left in chair;with call bell/phone within reach;with chair alarm set   Nurse Communication Mobility status        Time: 0102-7253 OT Time Calculation (min): 31  min  Charges: OT General Charges $OT Visit: 1 Visit OT Treatments $Self Care/Home Management : 8-22 mins $Therapeutic Activity: 8-22 mins  Aijah Lattner H., OTR/L Acute Rehabilitation   Leanny Moeckel Elane Yolanda Bonine 09/25/2020, 5:34 PM

## 2020-09-25 NOTE — Plan of Care (Signed)
  Problem: Education: Goal: Knowledge of General Education information will improve Description Including pain rating scale, medication(s)/side effects and non-pharmacologic comfort measures Outcome: Progressing   Problem: Health Behavior/Discharge Planning: Goal: Ability to manage health-related needs will improve Outcome: Progressing   

## 2020-09-25 NOTE — Discharge Summary (Addendum)
Discharge Summary  Leonard Berg Sr. POL:[id number] DOB: 02-27-1930  PCP: Leonard Koch, NP  Admit date: 09/20/2020 Discharge date: 09/27/2020  Discharge summary prepared by Dr Leonard Berg on 6/15, patient was waiting for a snf bed, no interval changes, there is a bed offer today on 6/17, he is discharge to snf.  Time spent: 40 mins  Recommendations for Outpatient Follow-up:  Orthopedics as scheduled PCP in 1 week  Discharge Diagnoses:  Active Hospital Problems   Diagnosis Date Noted   Right hip pain 09/21/2020   Falls, initial encounter 09/21/2020   Type 2 diabetes mellitus without complication, without long-term current use of insulin ([place]) 07/28/2019   Mixed hyperlipidemia 12/17/2009   THROMBOCYTOPENIA 12/12/2009   PAROXYSMAL ATRIAL FIBRILLATION 12/12/2009   Hypothyroidism 12/12/2009    Resolved Hospital Problems  No resolved problems to display.    Discharge Condition: Stable  Diet recommendation: As tolerated   Vitals:   09/26/20 2216 09/27/20 0430  BP: (!) 152/62 134/72  Pulse: 65 (!) 57  Resp: 18 18  Temp: 97.9 F (36.6 C) 98.6 F (37 C)  SpO2: 97% 97%    History of present illness:  Patient is 87 year old male with history of hypertension, hyperlipidemia, paroxysmal A. fib on anticoagulation, hypothyroidism, chronic thrombocytopenia, DVT who presented with complaints of right leg pain after a fall at home.  There is no report of head injury or loss of consciousness.  He had history of hip replacement on the right side by Dr. [name] several years ago.  On presentation he was hypertensive.  X-ray of the pelvis did not show any fracture but CT scan of the pelvis showed right trochanteric fracture.  Orthopedics consulted, non-operative approach recommended. Pt admitted for further management.      Today, patient denies any new complaints, stable for discharge to SNF for further rehab needs.     Hospital Course:  Principal Problem:   Right hip  pain Active Problems:   Hypothyroidism   Mixed hyperlipidemia   THROMBOCYTOPENIA   PAROXYSMAL ATRIAL FIBRILLATION   Type 2 diabetes mellitus without complication, without long-term current use of insulin ([facility])   Falls, initial encounter   Right hip fracture CT showed fracture extending from the right femoral greater trochanter into the proximal posterior right femoral diaphysis Ortho has evaluated, non-operative approach Continue pain management, supportive care   PT/OT rec SNF, TOC on board (patient lives alone, and will require SNF for rehab) Follow-up with orthopedics as an outpatient as scheduled   Paroxysmal A. Fib Rate controlled Continue with amiodarone and Xarelto   Hypothyroidism Continue Synthroid   CKD stage IIIa Currently kidney function at baseline   Hyperlipidemia On Crestor   Thrombocytopenia Chronic.  Stable    Estimated body mass index is 27.8 kg/m as calculated from the following:   Height as of this encounter: 6\' 2"  (1.88 m).   Weight as of this encounter: 98.2 kg.    Procedures: None  Consultations: Orthopedics  Discharge Exam: BP 134/72 (BP Location: Left Arm)   Pulse (!) 57   Temp 98.6 F (37 C) (Oral)   Resp 18   Ht 6\' 2"  (1.88 m)   Wt 98.2 kg   SpO2 97%   BMI 27.80 kg/m   General: NAD  Cardiovascular: S1, S2 present Respiratory: CTAB Abdomen: Soft, nontender, nondistended, bowel sounds present Musculoskeletal: No bilateral pedal edema noted Skin: Normal Psychiatry: Normal mood    Discharge Instructions You were cared for by a hospitalist during your hospital stay. If  you have any questions about your discharge medications or the care you received while you were in the hospital after you are discharged, you can call the unit and asked to speak with the hospitalist on call if the hospitalist that took care of you is not available. Once you are discharged, your primary care physician will handle any further medical issues.  Please note that NO REFILLS for any discharge medications will be authorized once you are discharged, as it is imperative that you return to your primary care physician (or establish a relationship with a primary care physician if you do not have one) for your aftercare needs so that they can reassess your need for medications and monitor your lab values.  Discharge Instructions     Diet - low sodium heart healthy   Complete by: As directed    Increase activity slowly   Complete by: As directed       Allergies as of 09/27/2020   No Known Allergies      Medication List     TAKE these medications    amiodarone 200 MG tablet Commonly known as: PACERONE Take 1 tablet (200 mg total) by mouth daily. What changed: when to take this   cetirizine 10 MG tablet Commonly known as: ZYRTEC TAKE 1 TABLET BY MOUTH ONCE DAILY FOR ALLERGIES.   ferrous sulfate 324 MG Tbec Take 324 mg by mouth at bedtime.   furosemide 40 MG tablet Commonly known as: LASIX Take 40 mg by mouth in the morning.   levothyroxine 100 MCG tablet Commonly known as: SYNTHROID TAKE 1 TABLET BY MOUTH ON EMPTY STOMACH WITH WATER ONLY ON SUNDAY THROUGH FRIDAY. TAKE 1 & 1/2 TABS SATURDAY ONLY.NO FOOD OR MEDS FOR 30 MINUTES AFTER.   polyethylene glycol 17 g packet Commonly known as: MIRALAX / GLYCOLAX Take 17 g by mouth daily.   Rivaroxaban 15 MG Tabs tablet Commonly known as: Xarelto Take 1 tablet (15 mg total) by mouth daily with supper.   rosuvastatin 10 MG tablet Commonly known as: CRESTOR TAKE ONE TABLET BY MOUTH IN THE EVENING FOR CHOLESTEROL.   tamsulosin 0.4 MG Caps capsule Commonly known as: FLOMAX Take 1 capsule (0.4 mg total) by mouth daily. For urine flow.   vitamin B-12 1000 MCG tablet Commonly known as: CYANOCOBALAMIN TAKE 1 TABLET BY MOUTH ONCE DAILY       ASK your doctor about these medications    Linzess 145 MCG Caps capsule Generic drug: linaclotide Take 1 capsule (145 mcg total) by  mouth daily before breakfast.       No Known Allergies  Follow-up Information     [name], MD. Schedule an appointment as soon as possible for a visit in 2 week(s).   Specialty: Orthopedic Surgery Why: to follow up clinically and radiographically his right hip peri-prosthetic fracture involving the greater trochanter Contact information: [address] [place] [address] [phone]         Leonard Koch, NP Follow up.   Specialty: Internal Medicine Contact information: [place] [address] [phone]         [name], MD .   Specialty: Cardiology Contact information: [address] [place] [address] [phone]                  The results of significant diagnostics from this hospitalization (including imaging, microbiology, ancillary and laboratory) are listed below for reference.    Significant Diagnostic  Studies: CT Head Wo Contrast  Result Date: 09/20/2020 CLINICAL DATA:  Worsening balance issues over the past months. EXAM: CT HEAD WITHOUT CONTRAST TECHNIQUE: Contiguous axial images were obtained from the base of the skull through the vertex without intravenous contrast. COMPARISON:  None. FINDINGS: Brain: Advanced cerebral and cerebellar atrophy. Left larger than right remote cerebellar hemispheric infarcts. Cortically based hypoattenuation within the right occipital lobe including on 22/3 is without significant mass effect, favored to represent a remote infarct. No mass lesion, hemorrhage, hydrocephalus, acute infarct, intra-axial, or extra-axial fluid collection. Vascular: Intracranial atherosclerosis. Skull: No significant soft tissue swelling.  No skull fracture. Sinuses/Orbits: Normal imaged portions of the orbits and globes. The left maxillary sinus is expanded and filled with heterogeneously hyperattenuating material, likely due to chronic sinusitis and inspissated  secretions. This extends into the left ethmoid air cells. This was present but progressive compared to the CTA of the neck of 01/26/2012. Clear mastoid air cells. Other: None. IMPRESSION: 1.  No acute intracranial abnormality. 2. Bilateral cerebellar lacunar infarcts. Right occipital hypoattenuation is favored to relate to a remote PCA infarct. 3. Advanced cerebral and cerebellar atrophy. 4. Progressive chronic sinusitis involving the left maxillary sinus. Electronically Signed   By: Abigail Miyamoto M.D.   On: 09/20/2020 18:41   CT PELVIS WO CONTRAST  Result Date: 09/21/2020 CLINICAL DATA:  Pelvic pain.  Fracture suspected. EXAM: CT PELVIS WITHOUT CONTRAST TECHNIQUE: Multidetector CT imaging of the pelvis was performed following the standard protocol without intravenous contrast. COMPARISON:  X-ray FINDINGS: Urinary Tract:  No abnormality visualized. Bowel:  Unremarkable visualized pelvic bowel loops. Vascular/Lymphatic: Calcified atherosclerosis in the distal abdominal aorta, iliac vessels, and femoral vessels. No adenopathy identified. Reproductive:  No mass or other significant abnormality Other:  None. Musculoskeletal: There is a fracture involving the right femoral greater trochanter. Evaluation of the fracture is somewhat limited due to streak artifact off the right hip replacement. The fracture extends into the proximal femoral diaphysis posteriorly as seen on series 10, image 45. Pedicle rods and screws are seen in the lumbosacral spine. IMPRESSION: 1. There is a fracture extending from the right femoral greater trochanter into the proximal posterior right femoral diaphysis. 2. No other acute abnormalities. Electronically Signed   By: Dorise Bullion III M.D   On: 09/21/2020 18:28   DG Hip Unilat W or Wo Pelvis 2-3 Views Right  Result Date: 09/20/2020 CLINICAL DATA:  Several falls this week. Right hip pain. EXAM: DG HIP (WITH OR WITHOUT PELVIS) 2-3V RIGHT COMPARISON:  None. FINDINGS: The right hip  prosthesis is intact. No periprosthetic fracture is identified. The pubic symphysis and SI joints are intact. No pelvic fractures. The left hip is intact. Lumbar spine surgical hardware noted. Extensive vascular calcifications. IMPRESSION: No acute bony findings. Electronically Signed   By: Marijo Sanes M.D.   On: 09/20/2020 18:30   DG FEMUR, MIN 2 VIEWS RIGHT  Result Date: 09/21/2020 CLINICAL DATA:  Right hip and leg pain. EXAM: RIGHT FEMUR 2 VIEWS COMPARISON:  Right hip radiographs, 09/20/2020. FINDINGS: The right hip prosthesis is intact. Evidence of remote trauma involving the right femoral shaft with a remote healed fracture. Right knee prosthesis is noted. There is of the/subtle lucency involving the greater trochanter. CT may be helpful for further evaluation and to exclude a fracture. IMPRESSION: 1. Intact right hip prosthesis. 2. Possible subtle lucency involving the greater trochanter. CT may be helpful for further evaluation and to exclude a fracture. 3. Remote posttraumatic changes involving the right femoral  shaft. No acute femur fracture. Electronically Signed   By: Marijo Sanes M.D.   On: 09/21/2020 17:04    Microbiology: Recent Results (from the past 240 hour(s))  Resp Panel by RT-PCR (Flu A&B, Covid) Nasopharyngeal Swab     Status: None   Collection Time: 09/21/20 12:58 PM   Specimen: Nasopharyngeal Swab; Nasopharyngeal(NP) swabs in vial transport medium  Result Value Ref Range Status   SARS Coronavirus 2 by RT PCR NEGATIVE NEGATIVE Final    Comment: (NOTE) SARS-CoV-2 target nucleic acids are NOT DETECTED.  The SARS-CoV-2 RNA is generally detectable in upper respiratory specimens during the acute phase of infection. The lowest concentration of SARS-CoV-2 viral copies this assay can detect is 138 copies/mL. A negative result does not preclude SARS-Cov-2 infection and should not be used as the sole basis for treatment or other patient management decisions. A negative result may  occur with  improper specimen collection/handling, submission of specimen other than nasopharyngeal swab, presence of viral mutation(s) within the areas targeted by this assay, and inadequate number of viral copies(<138 copies/mL). A negative result must be combined with clinical observations, patient history, and epidemiological information. The expected result is Negative.  Fact Sheet for Patients:  EntrepreneurPulse.com.au  Fact Sheet for Healthcare Providers:  IncredibleEmployment.be  This test is no t yet approved or cleared by the [place] FDA and  has been authorized for detection and/or diagnosis of SARS-CoV-2 by FDA under an Emergency Use Authorization (EUA). This EUA will remain  in effect (meaning this test can be used) for the duration of the COVID-19 declaration under Section 564(b)(1) of the Act, 21 U.S.C.section 360bbb-3(b)(1), unless the authorization is terminated  or revoked sooner.       Influenza A by PCR NEGATIVE NEGATIVE Final   Influenza B by PCR NEGATIVE NEGATIVE Final    Comment: (NOTE) The Xpert Xpress SARS-CoV-2/FLU/RSV plus assay is intended as an aid in the diagnosis of influenza from Nasopharyngeal swab specimens and should not be used as a sole basis for treatment. Nasal washings and aspirates are unacceptable for Xpert Xpress SARS-CoV-2/FLU/RSV testing.  Fact Sheet for Patients: EntrepreneurPulse.com.au  Fact Sheet for Healthcare Providers: IncredibleEmployment.be  This test is not yet approved or cleared by the [place] FDA and has been authorized for detection and/or diagnosis of SARS-CoV-2 by FDA under an Emergency Use Authorization (EUA). This EUA will remain in effect (meaning this test can be used) for the duration of the COVID-19 declaration under Section 564(b)(1) of the Act, 21 U.S.C. section 360bbb-3(b)(1), unless the authorization is terminated  or revoked.  Performed at [facility], [place] [address], [place], [place] [address]   Surgical PCR screen     Status: None   Collection Time: 09/22/20  7:40 PM   Specimen: Nasal Mucosa; Nasal Swab  Result Value Ref Range Status   MRSA, PCR NEGATIVE NEGATIVE Final   Staphylococcus aureus NEGATIVE NEGATIVE Final    Comment: (NOTE) The Xpert SA Assay (FDA approved for NASAL specimens in patients 48 years of age and older), is one component of a comprehensive surveillance program. It is not intended to diagnose infection nor to guide or monitor treatment. Performed at [facility], [place] [address], [place], [place] [address]   SARS CORONAVIRUS 2 (TAT 6-24 HRS) Nasopharyngeal Nasopharyngeal Swab     Status: None   Collection Time: 09/25/20 11:36 AM   Specimen: Nasopharyngeal Swab  Result Value Ref Range Status   SARS Coronavirus 2 NEGATIVE NEGATIVE Final  Comment: (NOTE) SARS-CoV-2 target nucleic acids are NOT DETECTED.  The SARS-CoV-2 RNA is generally detectable in upper and lower respiratory specimens during the acute phase of infection. Negative results do not preclude SARS-CoV-2 infection, do not rule out co-infections with other pathogens, and should not be used as the sole basis for treatment or other patient management decisions. Negative results must be combined with clinical observations, patient history, and epidemiological information. The expected result is Negative.  Fact Sheet for Patients: SugarRoll.be  Fact Sheet for Healthcare Providers: https://www.woods-mathews.com/  This test is not yet approved or cleared by the [place] FDA and  has been authorized for detection and/or diagnosis of SARS-CoV-2 by FDA under an Emergency Use Authorization (EUA). This EUA will remain  in effect (meaning this test can be used) for the duration of the COVID-19 declaration under Se ction 564(b)(1) of the Act, 21  U.S.C. section 360bbb-3(b)(1), unless the authorization is terminated or revoked sooner.  Performed at [facility], [place] [address] [place], [place] [address]      Labs: Basic Metabolic Panel: Recent Labs  Lab 09/21/20 0834 09/22/20 0156 09/22/20 1330 09/23/20 0248  NA 138 139  --  137  K 4.1 5.8* 3.6 3.9  CL 106 106  --  104  CO2 25 21*  --  26  GLUCOSE 110* 88  --  139*  BUN 18 20  --  17  CREATININE 1.55* 1.50*  --  1.67*  CALCIUM 8.8* 8.4*  --  8.2*   Liver Function Tests: No results for input(s): AST, ALT, ALKPHOS, BILITOT, PROT, ALBUMIN in the last 168 hours. No results for input(s): LIPASE, AMYLASE in the last 168 hours. No results for input(s): AMMONIA in the last 168 hours. CBC: Recent Labs  Lab 09/21/20 0834 09/22/20 0156  WBC 7.5 6.3  HGB 13.9 13.0  HCT 43.9 40.8  MCV 101.9* 101.7*  PLT 141* 141*   Cardiac Enzymes: Recent Labs  Lab 09/21/20 1916  CKTOTAL 72   BNP: BNP (last 3 results) No results for input(s): BNP in the last 8760 hours.  ProBNP (last 3 results) No results for input(s): PROBNP in the last 8760 hours.  CBG: No results for input(s): GLUCAP in the last 168 hours.     Signed:  Florencia Reasons, MD [facility] 09/27/2020, 9:53 AM

## 2020-09-26 DIAGNOSIS — S72001A Fracture of unspecified part of neck of right femur, initial encounter for closed fracture: Secondary | ICD-10-CM

## 2020-09-26 MED ORDER — DOCUSATE SODIUM 100 MG PO CAPS
100.0000 mg | ORAL_CAPSULE | Freq: Two times a day (BID) | ORAL | Status: DC
  Administered 2020-09-26 – 2020-09-27 (×3): 100 mg via ORAL
  Filled 2020-09-26 (×3): qty 1

## 2020-09-26 NOTE — Telephone Encounter (Signed)
Called and spoke with son will let us know when they are getting out of rehab to see if patient needs special walker. He will let us know.

## 2020-09-26 NOTE — Progress Notes (Signed)
PROGRESS NOTE  Leonard MELONE Sr. CNO:[id number] DOB: 04/25/1929 DOA: 09/20/2020 PCP: Leonard Koch, NP   LOS: 4 days   Brief narrative:  Patient is 87 year old male with past medical history of hypertension, hyperlipidemia, paroxysmal atrial fibrillation on anticoagulant, hypothyroidism, chronic thrombocytopenia and DVT.  Presented to the hospital after fall and right leg pain.  Patient was noted to have right trochanteric fracture.  Orthopedics was consulted.  Patient does have history of hip replacement in the right side as well.  Nonoperative approach was then recommended.  At this time, patient is awaiting for skilled nursing facility placement.  Assessment/Plan:  Principal Problem:   Right hip pain Active Problems:   Hypothyroidism   Mixed hyperlipidemia   THROMBOCYTOPENIA   PAROXYSMAL ATRIAL FIBRILLATION   Type 2 diabetes mellitus without complication, without long-term current use of insulin (HCC)   Falls, initial encounter   Right hip fracture CT showed fracture extending from the right femoral greater trochanter into the proximal posterior right femoral diaphysis.  Orthopedics on board.  Nonoperative treatment at this time.  Physical therapy recommending skilled nursing facility placement on discharge.  Continue pain management.   Paroxysmal A. Fib Rate controlled, continue amiodarone and Xarelto.   Hypothyroidism Continue Synthroid   CKD stage IIIa Monitor BMP.  Check BMP in AM.   Hyperlipidemia On Crestor   Thrombocytopenia Chronic and stable  DVT prophylaxis: Place and maintain sequential compression device Start: 09/22/20 1200 Rivaroxaban (XARELTO) tablet 15 mg    Code Status: DNR  Family Communication: None  Status is: Inpatient  Remains inpatient appropriate because:IV treatments appropriate due to intensity of illness or inability to take PO and Inpatient level of care appropriate due to severity of illness  Dispo: The patient is from:  Home              Anticipated d/c is to: SNF likely tomorrow as per transition of care.              Patient currently is medically stable to d/c.   Difficult to place patient No   Consultants: Orthopedics  Procedures: None  Anti-infectives:  None  Anti-infectives (From admission, onward)    None       Subjective: Today, patient was seen and examined at bedside.  Patient denies any pain, nausea, vomiting, shortness of breath or fever.  Objective: Vitals:   09/25/20 2025 09/26/20 0440  BP: (!) 118/57 (!) 144/64  Pulse: 63 (!) 54  Resp: 20 18  Temp: 97.8 F (36.6 C) (!) 97.5 F (36.4 C)  SpO2: 96% 96%    Intake/Output Summary (Last 24 hours) at 09/26/2020 1117 Last data filed at 09/26/2020 0316 Gross per 24 hour  Intake 270 ml  Output 900 ml  Net -630 ml   Filed Weights   09/21/20 1931  Weight: 98.2 kg   Body mass index is 27.8 kg/m.   Physical Exam: GENERAL: Patient is alert awake and oriented. Not in obvious distress. HENT: No scleral pallor or icterus. Pupils equally reactive to light. Oral mucosa is moist NECK: is supple, no gross swelling noted. CHEST: Clear to auscultation. No crackles or wheezes.  Diminished breath sounds bilaterally. CVS: S1 and S2 heard, no murmur. Regular rate and rhythm.  ABDOMEN: Soft, non-tender, bowel sounds are present. EXTREMITIES: No edema.  Right hip tenderness on palpation. CNS: Cranial nerves are intact. No focal motor deficits. SKIN: warm and dry without rashes.  Data Review: I have personally reviewed the following laboratory data and studies,  CBC: Recent Labs  Lab 09/21/20 0834 09/22/20 0156  WBC 7.5 6.3  HGB 13.9 13.0  HCT 43.9 40.8  MCV 101.9* 101.7*  PLT 141* 124*   Basic Metabolic Panel: Recent Labs  Lab 09/21/20 0834 09/22/20 0156 09/22/20 1330 09/23/20 0248  NA 138 139  --  137  K 4.1 5.8* 3.6 3.9  CL 106 106  --  104  CO2 25 21*  --  26  GLUCOSE 110* 88  --  139*  BUN 18 20  --  17   CREATININE 1.55* 1.50*  --  1.67*  CALCIUM 8.8* 8.4*  --  8.2*   Liver Function Tests: No results for input(s): AST, ALT, ALKPHOS, BILITOT, PROT, ALBUMIN in the last 168 hours. No results for input(s): LIPASE, AMYLASE in the last 168 hours. No results for input(s): AMMONIA in the last 168 hours. Cardiac Enzymes: Recent Labs  Lab 09/21/20 1916  CKTOTAL 72   BNP (last 3 results) No results for input(s): BNP in the last 8760 hours.  ProBNP (last 3 results) No results for input(s): PROBNP in the last 8760 hours.  CBG: No results for input(s): GLUCAP in the last 168 hours. Recent Results (from the past 240 hour(s))  Resp Panel by RT-PCR (Flu A&B, Covid) Nasopharyngeal Swab     Status: None   Collection Time: 09/21/20 12:58 PM   Specimen: Nasopharyngeal Swab; Nasopharyngeal(NP) swabs in vial transport medium  Result Value Ref Range Status   SARS Coronavirus 2 by RT PCR NEGATIVE NEGATIVE Final    Comment: (NOTE) SARS-CoV-2 target nucleic acids are NOT DETECTED.  The SARS-CoV-2 RNA is generally detectable in upper respiratory specimens during the acute phase of infection. The lowest concentration of SARS-CoV-2 viral copies this assay can detect is 138 copies/mL. A negative result does not preclude SARS-Cov-2 infection and should not be used as the sole basis for treatment or other patient management decisions. A negative result may occur with  improper specimen collection/handling, submission of specimen other than nasopharyngeal swab, presence of viral mutation(s) within the areas targeted by this assay, and inadequate number of viral copies(<138 copies/mL). A negative result must be combined with clinical observations, patient history, and epidemiological information. The expected result is Negative.  Fact Sheet for Patients:  EntrepreneurPulse.com.au  Fact Sheet for Healthcare Providers:  IncredibleEmployment.be  This test is no t yet  approved or cleared by the [place] FDA and  has been authorized for detection and/or diagnosis of SARS-CoV-2 by FDA under an Emergency Use Authorization (EUA). This EUA will remain  in effect (meaning this test can be used) for the duration of the COVID-19 declaration under Section 564(b)(1) of the Act, 21 U.S.C.section 360bbb-3(b)(1), unless the authorization is terminated  or revoked sooner.       Influenza A by PCR NEGATIVE NEGATIVE Final   Influenza B by PCR NEGATIVE NEGATIVE Final    Comment: (NOTE) The Xpert Xpress SARS-CoV-2/FLU/RSV plus assay is intended as an aid in the diagnosis of influenza from Nasopharyngeal swab specimens and should not be used as a sole basis for treatment. Nasal washings and aspirates are unacceptable for Xpert Xpress SARS-CoV-2/FLU/RSV testing.  Fact Sheet for Patients: EntrepreneurPulse.com.au  Fact Sheet for Healthcare Providers: IncredibleEmployment.be  This test is not yet approved or cleared by the [place] FDA and has been authorized for detection and/or diagnosis of SARS-CoV-2 by FDA under an Emergency Use Authorization (EUA). This EUA will remain in effect (meaning this test can be used) for the duration  of the COVID-19 declaration under Section 564(b)(1) of the Act, 21 U.S.C. section 360bbb-3(b)(1), unless the authorization is terminated or revoked.  Performed at [facility], [place] [address] [place], [place] [address]   Surgical PCR screen     Status: None   Collection Time: 09/22/20  7:40 PM   Specimen: Nasal Mucosa; Nasal Swab  Result Value Ref Range Status   MRSA, PCR NEGATIVE NEGATIVE Final   Staphylococcus aureus NEGATIVE NEGATIVE Final    Comment: (NOTE) The Xpert SA Assay (FDA approved for NASAL specimens in patients 19 years of age and older), is one component of a comprehensive surveillance program. It is not intended to diagnose infection nor to guide or monitor  treatment. Performed at [facility], [place] [address] [place], [place] [address]   SARS CORONAVIRUS 2 (TAT 6-24 HRS) Nasopharyngeal Nasopharyngeal Swab     Status: None   Collection Time: 09/25/20 11:36 AM   Specimen: Nasopharyngeal Swab  Result Value Ref Range Status   SARS Coronavirus 2 NEGATIVE NEGATIVE Final    Comment: (NOTE) SARS-CoV-2 target nucleic acids are NOT DETECTED.  The SARS-CoV-2 RNA is generally detectable in upper and lower respiratory specimens during the acute phase of infection. Negative results do not preclude SARS-CoV-2 infection, do not rule out co-infections with other pathogens, and should not be used as the sole basis for treatment or other patient management decisions. Negative results must be combined with clinical observations, patient history, and epidemiological information. The expected result is Negative.  Fact Sheet for Patients: SugarRoll.be  Fact Sheet for Healthcare Providers: https://www.woods-mathews.com/  This test is not yet approved or cleared by the [place] FDA and  has been authorized for detection and/or diagnosis of SARS-CoV-2 by FDA under an Emergency Use Authorization (EUA). This EUA will remain  in effect (meaning this test can be used) for the duration of the COVID-19 declaration under Se ction 564(b)(1) of the Act, 21 U.S.C. section 360bbb-3(b)(1), unless the authorization is terminated or revoked sooner.  Performed at [facility], [place] [address] [place], [place] [address]      Studies: No results found.    Flora Lipps, MD  [facility] 09/26/2020  If 7PM-7AM, please contact night-coverage

## 2020-09-26 NOTE — TOC Progression Note (Signed)
Transition of Care (TOC) - Progression Note    Patient Details  Name: NIKOLI NASSER Sr. MRN: [id number] Date of Birth: 1929-10-23  Transition of Care [facility]) CM/SW [place], [place] Phone Number: 09/26/2020, 1:34 PM  Clinical Narrative:     CSW spoke to admission coordinator at [facility]. They stated that they are unable to take the pt today due to patients moving rooms but will be able to accept him Friday morning. CSW updated pt and family.   Pts Josem Kaufmann was approved. Dates are approved for 6/15-6/20. Care coordinator is [place] [facility].   Pts covid test was completed 6/15 and is negative. [facility] confirmed pt will not need a new covid test before discharge.   Expected Discharge Plan: [place] Barriers to Discharge: Continued Medical Work up  Expected Discharge Plan and Services Expected Discharge Plan: [place] arrangements for the past 2 months: Single Family Home Expected Discharge Date: 09/25/20                                     Social Determinants of Health (SDOH) Interventions    Readmission Risk Interventions No flowsheet data found.  Emeterio Reeve, Latanya Presser, [place] Social Worker [phone]

## 2020-09-26 NOTE — Progress Notes (Signed)
Physical Therapy Treatment Patient Details Name: Leonard SELMER Sr. MRN: [id number] DOB: 12/31/1929 Today's Date: 09/26/2020    History of Present Illness 87 y.o. male presented to ED 09/20/20 s/p fall that occurred a few days ago after tripping outside, CT showing fracture extending from the right femoral greater trochanter into the proximal posterior right femoral diaphysis, non operative. Pt with history of hypertension, hyperlipidemia, paroxysmal A. fib on anticoagulation, hypothyroidism, chronic thrombocytopenia, DVT, and R hip replacement several years ago.    PT Comments    Pt was able to progress gait distance further down the hallway, however, needed a boost from an elevated bed to get to standing. He was very stiff today reporting he had not been up yet.  Once he got going on his feet he required less assistance, but has a forward pitch to him.  He is a high fall risk and continues to be appropriate for SNF level rehab at discharge.  PT will continue to follow acutely for safe mobility progression.  Follow Up Recommendations  SNF     Equipment Recommendations  Rolling walker with 5" wheels    Recommendations for Other Services       Precautions / Restrictions Precautions Precautions: Fall Restrictions RLE Weight Bearing: Partial weight bearing RLE Partial Weight Bearing Percentage or Pounds: 50 Other Position/Activity Restrictions: no active R hip abduction    Mobility  Bed Mobility Overal bed mobility: Needs Assistance Bed Mobility: Supine to Sit     Supine to sit: Supervision     General bed mobility comments: Pt able to come to EOB with supervision.  HOB elevated and light use of rail.    Transfers Overall transfer level: Needs assistance Equipment used: Rolling walker (2 wheeled) Transfers: Sit to/from Stand Sit to Stand: Min assist;From elevated surface         General transfer comment: Min assist to come to standing from elevated  surfaces.  Ambulation/Gait Ambulation/Gait assistance: Min guard;Min assist Gait Distance (Feet): 90 Feet Assistive device: Rolling walker (2 wheeled) Gait Pattern/deviations: Step-to pattern;Antalgic     General Gait Details: Min assist initially due to pain and stiffness, but progressed to min guard assist.  Pt with flexed trunk, but reports this is baseline ("I hunch over".  Cues for safer LE sequencing and verbal cues for 50% PWB explained prior to standing up.  Pt looks like he is doing more TDWB than partial at this time, limited by pain and striking on his toes (also reports R leg is shorter than L leg and he wears and lift in his right shoe).   Stairs             Wheelchair Mobility    Modified Rankin (Stroke Patients Only)       Balance Overall balance assessment: Mild deficits observed, not formally tested Sitting-balance support: No upper extremity supported;Feet supported Sitting balance-Leahy Scale: Good     Standing balance support: Bilateral upper extremity supported Standing balance-Leahy Scale: Poor Standing balance comment: heavily reliant on bil UE and min to min guard assit from PT when standing.                            Cognition Arousal/Alertness: Awake/alert Behavior During Therapy: WFL for tasks assessed/performed Overall Cognitive Status: Within Functional Limits for tasks assessed  General Comments: Pt is HOH making comprehension difficult.  He admitts to poor memory at baseline.  Did have difficulty recalling WB status, but did accurately report that he should not move his leg out to the side.      Exercises Other Exercises Other Exercises: encouraged ankle pumps on R (20 times an hour) due to toe walking on this side and for antiembolic purposes.  He says he cannot do them on the left because his ankle is fused.    General Comments        Pertinent Vitals/Pain Pain Assessment:  Faces Faces Pain Scale: Hurts even more Pain Location: R hip with any mobility Pain Descriptors / Indicators: Sore;Grimacing;Guarding Pain Intervention(s): Limited activity within patient's tolerance;Monitored during session;Repositioned    Home Living                      Prior Function            PT Goals (current goals can now be found in the care plan section) Acute Rehab PT Goals Patient Stated Goal: go to rehab and then home Progress towards PT goals: Progressing toward goals    Frequency    Min 3X/week      PT Plan Current plan remains appropriate;Frequency needs to be updated    Co-evaluation              AM-PAC PT "6 Clicks" Mobility   Outcome Measure  Help needed turning from your back to your side while in a flat bed without using bedrails?: A Little Help needed moving from lying on your back to sitting on the side of a flat bed without using bedrails?: A Little Help needed moving to and from a bed to a chair (including a wheelchair)?: A Little Help needed standing up from a chair using your arms (e.g., wheelchair or bedside chair)?: A Little Help needed to walk in hospital room?: A Little Help needed climbing 3-5 steps with a railing? : Total 6 Click Score: 16    End of Session Equipment Utilized During Treatment: Gait belt Activity Tolerance: Patient limited by pain Patient left: in chair;with call bell/phone within reach;with chair alarm set   PT Visit Diagnosis: Other abnormalities of gait and mobility (R26.89);Pain Pain - Right/Left: Right Pain - part of body: Hip;Knee     Time: 9509-3267 PT Time Calculation (min) (ACUTE ONLY): 29 min  Charges:  $Gait Training: 23-37 mins                     Verdene Lennert, PT, DPT  Acute Rehabilitation Ortho Tech Supervisor [phone] pager [phone]) [phone] office

## 2020-09-26 NOTE — Telephone Encounter (Signed)
Spoke with the patient's son (DPR). After speaking last week, the patient had leg xray and went to the ER for broken femur.  He said his BP has been consistently about 150/70 since ER. He will continue to check BP and call if systolic BP is consistently over 140 in about a week or two when his pain is better controlled. He was grateful for follow-up and agrees with plan.

## 2020-09-27 DIAGNOSIS — Z7401 Bed confinement status: Secondary | ICD-10-CM | POA: Diagnosis not present

## 2020-09-27 DIAGNOSIS — R531 Weakness: Secondary | ICD-10-CM | POA: Diagnosis not present

## 2020-09-27 DIAGNOSIS — K582 Mixed irritable bowel syndrome: Secondary | ICD-10-CM | POA: Diagnosis not present

## 2020-09-27 DIAGNOSIS — M6281 Muscle weakness (generalized): Secondary | ICD-10-CM | POA: Diagnosis not present

## 2020-09-27 DIAGNOSIS — Z736 Limitation of activities due to disability: Secondary | ICD-10-CM | POA: Diagnosis not present

## 2020-09-27 DIAGNOSIS — I10 Essential (primary) hypertension: Secondary | ICD-10-CM | POA: Diagnosis not present

## 2020-09-27 DIAGNOSIS — J302 Other seasonal allergic rhinitis: Secondary | ICD-10-CM | POA: Diagnosis not present

## 2020-09-27 DIAGNOSIS — H04129 Dry eye syndrome of unspecified lacrimal gland: Secondary | ICD-10-CM | POA: Diagnosis not present

## 2020-09-27 DIAGNOSIS — D309 Benign neoplasm of urinary organ, unspecified: Secondary | ICD-10-CM | POA: Diagnosis not present

## 2020-09-27 DIAGNOSIS — M25551 Pain in right hip: Secondary | ICD-10-CM | POA: Diagnosis not present

## 2020-09-27 DIAGNOSIS — N189 Chronic kidney disease, unspecified: Secondary | ICD-10-CM | POA: Diagnosis not present

## 2020-09-27 DIAGNOSIS — M9701XD Periprosthetic fracture around internal prosthetic right hip joint, subsequent encounter: Secondary | ICD-10-CM | POA: Diagnosis not present

## 2020-09-27 DIAGNOSIS — S72114A Nondisplaced fracture of greater trochanter of right femur, initial encounter for closed fracture: Secondary | ICD-10-CM | POA: Diagnosis not present

## 2020-09-27 DIAGNOSIS — E039 Hypothyroidism, unspecified: Secondary | ICD-10-CM | POA: Diagnosis not present

## 2020-09-27 DIAGNOSIS — R488 Other symbolic dysfunctions: Secondary | ICD-10-CM | POA: Diagnosis not present

## 2020-09-27 DIAGNOSIS — E785 Hyperlipidemia, unspecified: Secondary | ICD-10-CM | POA: Diagnosis not present

## 2020-09-27 DIAGNOSIS — D509 Iron deficiency anemia, unspecified: Secondary | ICD-10-CM | POA: Diagnosis not present

## 2020-09-27 DIAGNOSIS — I480 Paroxysmal atrial fibrillation: Secondary | ICD-10-CM | POA: Diagnosis not present

## 2020-09-27 NOTE — Progress Notes (Addendum)
Discharge summary is done by Dr. [name] on 6/15.  Details please refer to d/c summary. There is no interval changes. Patient has been medically stable to discharge, awaiting for bed availability at  skilled nursing facility placement.  I was informed by social worker this morning that SNF is able to take him today,  he is seen and examined prior to discharge, no interval changes, vital signs are stable, he denies pain, he desires to SNF placement.  He is to follow up with ortho Dr [name] for right hip fracture, per Dr [name], conservative management , PWB RLE no active abduction.  continue PT , RTC in 2 weeks for clinical and radiographic follow up, Pain control. Son updated over the phone, he is in agreement with snf placement today on 6/17.

## 2020-09-27 NOTE — TOC Transition Note (Signed)
Transition of Care [facility]) - CM/SW Discharge Note   Patient Details  Name: Leonard COSSEY Sr. MRN: [id number] Date of Birth: 1929/08/31  Transition of Care [facility]) CM/SW Contact:  Bethann Berkshire, [place] Phone Number: 09/27/2020, 10:51 AM   Clinical Narrative:     Patient will DC to: [facility] [place] Anticipated DC date: 09/27/20 Family notified: Pearson Forster, son Transport by: Corey Harold   Per MD patient ready for DC to [facility]. RN, patient, patient's family, and facility notified of DC. Discharge Summary and FL2 sent to facility. RN to call report prior to discharge [phone] Room 712). DC packet on chart. Ambulance transport requested for patient.   CSW will sign off for now as social work intervention is no longer needed. Please consult [place] again if new needs arise.     Barriers to Discharge: No Barriers Identified   Patient Goals and CMS Choice Patient states their goals for this hospitalization and ongoing recovery are:: To get better CMS Medicare.gov Compare Post Acute Care list provided to:: Patient Choice offered to / list presented to : Patient  Discharge Placement              Patient chooses bed at: [facility]  Patient to be transferred to facility by: [place] Name of family member notified: Pearson Forster Son Patient and family notified of of transfer: 09/27/20  Discharge Plan and Services                                     Social Determinants of Health (SDOH) Interventions     Readmission Risk Interventions No flowsheet data found.

## 2020-09-30 ENCOUNTER — Ambulatory Visit: Payer: Medicare Other | Admitting: Oncology

## 2020-09-30 ENCOUNTER — Telehealth: Payer: Self-pay

## 2020-09-30 ENCOUNTER — Inpatient Hospital Stay: Payer: Medicare Other

## 2020-09-30 DIAGNOSIS — D500 Iron deficiency anemia secondary to blood loss (chronic): Secondary | ICD-10-CM

## 2020-09-30 DIAGNOSIS — E538 Deficiency of other specified B group vitamins: Secondary | ICD-10-CM

## 2020-09-30 NOTE — Telephone Encounter (Signed)
Patient did not come for lab appt on 6/20 and is scheudled to see Dr. Tasia Catchings on 6/21.  Please call pt to r/s the lab (1 day prior)/MD.

## 2020-10-01 ENCOUNTER — Encounter: Payer: Self-pay | Admitting: Oncology

## 2020-10-01 ENCOUNTER — Inpatient Hospital Stay: Payer: Medicare Other | Admitting: Oncology

## 2020-10-02 DIAGNOSIS — I10 Essential (primary) hypertension: Secondary | ICD-10-CM | POA: Diagnosis not present

## 2020-10-02 DIAGNOSIS — I480 Paroxysmal atrial fibrillation: Secondary | ICD-10-CM | POA: Diagnosis not present

## 2020-10-02 DIAGNOSIS — M25551 Pain in right hip: Secondary | ICD-10-CM | POA: Diagnosis not present

## 2020-10-02 DIAGNOSIS — N189 Chronic kidney disease, unspecified: Secondary | ICD-10-CM | POA: Diagnosis not present

## 2020-10-07 DIAGNOSIS — S72114A Nondisplaced fracture of greater trochanter of right femur, initial encounter for closed fracture: Secondary | ICD-10-CM | POA: Diagnosis not present

## 2020-10-07 DIAGNOSIS — M25551 Pain in right hip: Secondary | ICD-10-CM | POA: Diagnosis not present

## 2020-10-08 DIAGNOSIS — M25551 Pain in right hip: Secondary | ICD-10-CM | POA: Diagnosis not present

## 2020-10-08 DIAGNOSIS — I480 Paroxysmal atrial fibrillation: Secondary | ICD-10-CM | POA: Diagnosis not present

## 2020-10-08 DIAGNOSIS — H04129 Dry eye syndrome of unspecified lacrimal gland: Secondary | ICD-10-CM | POA: Diagnosis not present

## 2020-10-08 DIAGNOSIS — I10 Essential (primary) hypertension: Secondary | ICD-10-CM | POA: Diagnosis not present

## 2020-10-11 DIAGNOSIS — M25551 Pain in right hip: Secondary | ICD-10-CM | POA: Diagnosis not present

## 2020-10-11 DIAGNOSIS — I480 Paroxysmal atrial fibrillation: Secondary | ICD-10-CM | POA: Diagnosis not present

## 2020-10-11 DIAGNOSIS — N189 Chronic kidney disease, unspecified: Secondary | ICD-10-CM | POA: Diagnosis not present

## 2020-10-11 DIAGNOSIS — I10 Essential (primary) hypertension: Secondary | ICD-10-CM | POA: Diagnosis not present

## 2020-10-16 DIAGNOSIS — M6281 Muscle weakness (generalized): Secondary | ICD-10-CM | POA: Diagnosis not present

## 2020-10-18 DIAGNOSIS — E1122 Type 2 diabetes mellitus with diabetic chronic kidney disease: Secondary | ICD-10-CM | POA: Diagnosis not present

## 2020-10-18 DIAGNOSIS — M9701XD Periprosthetic fracture around internal prosthetic right hip joint, subsequent encounter: Secondary | ICD-10-CM | POA: Diagnosis not present

## 2020-10-18 DIAGNOSIS — H04129 Dry eye syndrome of unspecified lacrimal gland: Secondary | ICD-10-CM | POA: Diagnosis not present

## 2020-10-18 DIAGNOSIS — M961 Postlaminectomy syndrome, not elsewhere classified: Secondary | ICD-10-CM | POA: Diagnosis not present

## 2020-10-18 DIAGNOSIS — N400 Enlarged prostate without lower urinary tract symptoms: Secondary | ICD-10-CM | POA: Diagnosis not present

## 2020-10-18 DIAGNOSIS — G8929 Other chronic pain: Secondary | ICD-10-CM | POA: Diagnosis not present

## 2020-10-18 DIAGNOSIS — I129 Hypertensive chronic kidney disease with stage 1 through stage 4 chronic kidney disease, or unspecified chronic kidney disease: Secondary | ICD-10-CM | POA: Diagnosis not present

## 2020-10-18 DIAGNOSIS — E782 Mixed hyperlipidemia: Secondary | ICD-10-CM | POA: Diagnosis not present

## 2020-10-18 DIAGNOSIS — F419 Anxiety disorder, unspecified: Secondary | ICD-10-CM | POA: Diagnosis not present

## 2020-10-18 DIAGNOSIS — I480 Paroxysmal atrial fibrillation: Secondary | ICD-10-CM | POA: Diagnosis not present

## 2020-10-18 DIAGNOSIS — N1831 Chronic kidney disease, stage 3a: Secondary | ICD-10-CM | POA: Diagnosis not present

## 2020-10-18 DIAGNOSIS — I2510 Atherosclerotic heart disease of native coronary artery without angina pectoris: Secondary | ICD-10-CM | POA: Diagnosis not present

## 2020-10-18 DIAGNOSIS — I4510 Unspecified right bundle-branch block: Secondary | ICD-10-CM | POA: Diagnosis not present

## 2020-10-18 DIAGNOSIS — E039 Hypothyroidism, unspecified: Secondary | ICD-10-CM | POA: Diagnosis not present

## 2020-10-18 DIAGNOSIS — D509 Iron deficiency anemia, unspecified: Secondary | ICD-10-CM | POA: Diagnosis not present

## 2020-10-18 DIAGNOSIS — M1990 Unspecified osteoarthritis, unspecified site: Secondary | ICD-10-CM | POA: Diagnosis not present

## 2020-10-22 DIAGNOSIS — Z85828 Personal history of other malignant neoplasm of skin: Secondary | ICD-10-CM | POA: Diagnosis not present

## 2020-10-22 DIAGNOSIS — D2271 Melanocytic nevi of right lower limb, including hip: Secondary | ICD-10-CM | POA: Diagnosis not present

## 2020-10-22 DIAGNOSIS — D2262 Melanocytic nevi of left upper limb, including shoulder: Secondary | ICD-10-CM | POA: Diagnosis not present

## 2020-10-22 DIAGNOSIS — D2261 Melanocytic nevi of right upper limb, including shoulder: Secondary | ICD-10-CM | POA: Diagnosis not present

## 2020-11-07 ENCOUNTER — Other Ambulatory Visit: Payer: Self-pay

## 2020-11-07 ENCOUNTER — Ambulatory Visit (INDEPENDENT_AMBULATORY_CARE_PROVIDER_SITE_OTHER): Payer: Medicare Other | Admitting: Primary Care

## 2020-11-07 ENCOUNTER — Encounter: Payer: Self-pay | Admitting: Primary Care

## 2020-11-07 VITALS — BP 120/70 | HR 71 | Temp 98.0°F | Ht 74.0 in | Wt 211.0 lb

## 2020-11-07 DIAGNOSIS — D509 Iron deficiency anemia, unspecified: Secondary | ICD-10-CM | POA: Diagnosis not present

## 2020-11-07 DIAGNOSIS — E538 Deficiency of other specified B group vitamins: Secondary | ICD-10-CM | POA: Insufficient documentation

## 2020-11-07 DIAGNOSIS — K5900 Constipation, unspecified: Secondary | ICD-10-CM | POA: Diagnosis not present

## 2020-11-07 DIAGNOSIS — I2510 Atherosclerotic heart disease of native coronary artery without angina pectoris: Secondary | ICD-10-CM

## 2020-11-07 DIAGNOSIS — E038 Other specified hypothyroidism: Secondary | ICD-10-CM

## 2020-11-07 DIAGNOSIS — E785 Hyperlipidemia, unspecified: Secondary | ICD-10-CM

## 2020-11-07 DIAGNOSIS — E782 Mixed hyperlipidemia: Secondary | ICD-10-CM

## 2020-11-07 DIAGNOSIS — E119 Type 2 diabetes mellitus without complications: Secondary | ICD-10-CM | POA: Diagnosis not present

## 2020-11-07 DIAGNOSIS — S72114S Nondisplaced fracture of greater trochanter of right femur, sequela: Secondary | ICD-10-CM

## 2020-11-07 DIAGNOSIS — I10 Essential (primary) hypertension: Secondary | ICD-10-CM

## 2020-11-07 DIAGNOSIS — D500 Iron deficiency anemia secondary to blood loss (chronic): Secondary | ICD-10-CM

## 2020-11-07 LAB — IBC + FERRITIN
Ferritin: 169.6 ng/mL (ref 22.0–322.0)
Iron: 98 ug/dL (ref 42–165)
Saturation Ratios: 39.3 % (ref 20.0–50.0)
Transferrin: 178 mg/dL — ABNORMAL LOW (ref 212.0–360.0)

## 2020-11-07 LAB — CBC
HCT: 43.2 % (ref 39.0–52.0)
Hemoglobin: 14.3 g/dL (ref 13.0–17.0)
MCHC: 33.2 g/dL (ref 30.0–36.0)
MCV: 99.6 fl (ref 78.0–100.0)
Platelets: 159 10*3/uL (ref 150.0–400.0)
RBC: 4.34 Mil/uL (ref 4.22–5.81)
RDW: 13 % (ref 11.5–15.5)
WBC: 5.1 10*3/uL (ref 4.0–10.5)

## 2020-11-07 LAB — TSH: TSH: 2.13 u[IU]/mL (ref 0.35–5.50)

## 2020-11-07 LAB — HEMOGLOBIN A1C: Hgb A1c MFr Bld: 5.8 % (ref 4.6–6.5)

## 2020-11-07 LAB — VITAMIN B12: Vitamin B-12: 590 pg/mL (ref 211–911)

## 2020-11-07 MED ORDER — LINZESS 145 MCG PO CAPS: 145.0000 ug | ORAL_CAPSULE | ORAL | 1 refills | Status: DC

## 2020-11-07 NOTE — Assessment & Plan Note (Signed)
Not on medication therapy.  Repeat A1C pending.  Continue off treatment.

## 2020-11-07 NOTE — Assessment & Plan Note (Signed)
Doing well on Linzezz 145 mcg for which he is now taking every other day. Continue same. Refills provided.

## 2020-11-07 NOTE — Assessment & Plan Note (Signed)
Asymptomatic. Following with cardiology.  Continue lipid, diabetes, and BP control.

## 2020-11-07 NOTE — Assessment & Plan Note (Signed)
Repeat CBC, B12, and IBC panel pending. No follow up with hematology.  No rectal bleeding or hematuria.

## 2020-11-07 NOTE — Assessment & Plan Note (Signed)
Seems to be taking levothyroxine correctly for the most part. Repeat TSH pending. Continue levothyroxine 100 mcg daily except for Saturday when taking 150 mcg for now.

## 2020-11-07 NOTE — Assessment & Plan Note (Signed)
Controlled in the office today, continue furosemide 40 mg daily. Labs pending.

## 2020-11-07 NOTE — Patient Instructions (Signed)
Stop by the lab prior to leaving today. I will notify you of your results once received.   It was a pleasure to see you today!  

## 2020-11-07 NOTE — Assessment & Plan Note (Signed)
Compliant to daily B12 supplement. Repeat B12 level pending.

## 2020-11-07 NOTE — Assessment & Plan Note (Signed)
Right side. Non operative. Following with orthopedics in the outpatient setting.  Hospital notes, labs, imaging reviewed.

## 2020-11-07 NOTE — Progress Notes (Signed)
Subjective:    Patient ID: Leonard Brace Sr., male    DOB: [birth year], 87 y.o.   MRN: [id number]  HPI  Leonard Berg [facility] Sr. is a very pleasant 87 y.o. male with a history of hypothyroidism, hypertension, paroxysmal atrial fibrillation, GIB, osteoarthritis, PE, renal artery stenosis, right hip replacement, CKD who presents today with his son for hospital follow up and follow up of chronic conditions.  Hospital admission from 06/10-06/17 for right hip pain after sustaining a fall. History of right hip replacement to same side several years ago. CT pelvis with right trochanteric fracture. No operation recommended.   Admitted to [facility] for about 18 days for rehab, this was effective for balance and strength. Following with orthopedics, due for follow up visit soon. He is now visited by PT/OT several days weekly in his home. He denies falls since his hospital stay, is using his walker.  Overall not bothered by his right hip, is not taking anything for pain regularly.  Following with nephrology, scheduled for follow up in early August.   Doing well Linzess 145 mcg for constipation, is taking every other day and having bowel movements every three days.   Missed visit with hematologist given his hospital admission. Denies rectal bleeding. Compliant to Xarelto. Compliant to vitamin B12 daily.   Following with cardiology, last visit in February 2022, no changes made and was continued on amiodarone 200 mg and Xarelto 15 mg.   Compliant to levothyroxine 100 mcg and is taking correctly for the most part per his son. The patient will sometimes take his vitamin B12 in the morning with his levothyroxine.   BP Readings from Last 3 Encounters:  11/07/20 120/70  09/27/20 134/72  06/05/20 136/70      Review of Systems  Respiratory:  Negative for shortness of breath.   Cardiovascular:  Negative for chest pain.  Gastrointestinal:  Negative for constipation.  Musculoskeletal:   Positive for arthralgias.  Neurological:  Negative for dizziness.        Past Medical History:  Diagnosis Date   Anemia    Anxiety    Arrhythmia    PAROXYSMAL ATRIAL FIBRILLATION   Arthritis    OSTEOARTHRITIS   Cataracts, bilateral    Chronic back pain    Coronary artery disease 8/11   CABG...LM EMERGENT WITH IABP sees Dr. Legrand Como cooper   DJD (degenerative joint disease)    DVT (deep venous thrombosis) (HCC)    Dyspnea    GERD (gastroesophageal reflux disease)    History of BPH    Hyperlipidemia    Hypertension    Hypothyroidism    Iron deficiency anemia due to chronic blood loss 06/29/2019   Lumbar post-laminectomy syndrome    Myocardial infarction [facility])    Pulmonary embolism ([place]) 2009   hx of   RBBB (right bundle branch block)    Renal artery stenosis (HCC)    Thrombocytopenia (HCC)    Thyroid disease    HYPOTHYROIDISM   Type 2 diabetes mellitus without complication, without long-term current use of insulin ([place]) 07/28/2019   Unstable angina ([place])    NONE IN LONG TIME    Social History   Socioeconomic History   Marital status: Married    Spouse name: Not on file   Number of children: 2   Years of education: Not on file   Highest education level: Not on file  Occupational History   Occupation: RETIRED     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, [place]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  Tobacco Use   Smoking status: Former    Types: Cigarettes    Quit date: 04/13/1974    Years since quitting: 46.6   Smokeless tobacco: Former    Types: Chew    Quit date: 01/26/2002   Tobacco comments:    He has smoked about 27-pack-year hx   Vaping Use   Vaping Use: Never used  Substance and Sexual Activity   Alcohol use: Not Currently    Alcohol/week: 2.0 standard drinks    Types: 2 Cans of beer per week    Comment: He used to drink more heavily , but rarely drinks at the current time   Drug use: No   Sexual activity: Not on file  Other Topics Concern   Not on file   Social History Narrative   [place].   HE HAS 2 GROWN CHILDREN   HE IS RETIRED PRISON FIRM SUPERINTENDENT.   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   HE GOES TO THE [employer] 3 X WEEKLY.   Social Determinants of Health   Financial Resource Strain: Not on file  Food Insecurity: Not on file  Transportation Needs: Not on file  Physical Activity: Not on file  Stress: Not on file  Social Connections: Not on file  Intimate Partner Violence: Not on file    Past Surgical History:  Procedure Laterality Date   Blood clot  Feb.20,  2016   Pulmonary Embolism   CARDIOVERSION N/A 03/15/2019   Procedure: CARDIOVERSION;  Surgeon: Pixie Casino, MD;  Location: [place];  Service: Cardiovascular;  Laterality: N/A;   CAROTID ENDARTERECTOMY Right Oct. 17, 2013   CE   CAROTID ENDARTERECTOMY Left Dec. 3, 2016   CE   CATARACT EXTRACTION  05/2014,06/2014   COLONOSCOPY WITH PROPOFOL N/A 05/20/2019   Procedure: COLONOSCOPY WITH PROPOFOL;  Surgeon: [name]., MD;  Location: [place];  Service: Gastroenterology;  Laterality: N/A;   CORONARY ARTERY BYPASS GRAFT  11-21-09   EMERGENT X 3 GRAFTING. ..[name], MD, cc: [name]   ENDARTERECTOMY  01/28/2012   Procedure: ENDARTERECTOMY CAROTID;  Surgeon: [name], MD;  Location: [place];  Service: Vascular;  Laterality: Right;  with Primary Closure of Artery   ENDARTERECTOMY  03/15/2012   Procedure: ENDARTERECTOMY CAROTID;  Surgeon: [name], MD;  Location: [place];  Service: Vascular;  Laterality: Left;   ENTEROSCOPY N/A 05/23/2019   Procedure: ENTEROSCOPY;  Surgeon: [name], MD;  Location: [place];  Service: Endoscopy;  Laterality: N/A;   ESOPHAGOGASTRODUODENOSCOPY N/A 05/20/2019   Procedure: ESOPHAGOGASTRODUODENOSCOPY (EGD);  Surgeon: [name]., MD;  Location: [place];  Service: Gastroenterology;  Laterality: N/A;    ESOPHAGOGASTRODUODENOSCOPY (EGD) WITH PROPOFOL N/A 05/19/2019   Procedure: ESOPHAGOGASTRODUODENOSCOPY (EGD) WITH PROPOFOL;  Surgeon: [name], MD;  Location: [place];  Service: Endoscopy;  Laterality: N/A;   FOOT SURGERY     GIVENS CAPSULE STUDY N/A 05/20/2019   Procedure: GIVENS CAPSULE STUDY;  Surgeon: [name]., MD;  Location: [place];  Service: Gastroenterology;  Laterality: N/A;   HOT HEMOSTASIS N/A 05/23/2019   Procedure: HOT HEMOSTASIS (ARGON PLASMA COAGULATION/BICAP);  Surgeon: [name], MD;  Location: [place];  Service: Endoscopy;  Laterality: N/A;   MANDIBLE SURGERY     MAXIMUM ACCESS (MAS)POSTERIOR LUMBAR INTERBODY FUSION (PLIF) 2 LEVEL N/A 08/08/2015   Procedure: Lumbar Four-Five, Lumbar Five-Sacral One Maximum access posterior  lumbar interbody fusion;  Surgeon: [name], MD;  Location: [place] [facility];  Service: Neurosurgery;  Laterality: N/A;  LUMBAR FOUR-FIVE ,LUMBAR FIVE -SACRAL Maximum access posterior lumbar interbody fusion   POLYPECTOMY  05/20/2019   Procedure: POLYPECTOMY;  Surgeon: [name], Telford Nab., MD;  Location: [place];  Service: Gastroenterology;;   RENAL ARTERY STENT  2010   REPLACEMENT TOTAL KNEE  2006   RIGHT   SPINAL CORD STIMULATOR INSERTION N/A 06/18/2017   Procedure: LUMBAR SPINAL CORD STIMULATOR INSERTION;  Surgeon: [name], MD;  Location: [place];  Service: Neurosurgery;  Laterality: N/A;  LUMBAR SPINAL CORD STIMULATOR INSERTION   TOTAL HIP ARTHROPLASTY     right    Family History  Problem Relation Age of Onset   Lung cancer Father 20       deceased    Cancer Father 72       LUNG   Stroke Mother 6       DECEASED    Hyperlipidemia Mother    Heart attack Brother 42       deceased   Diabetes Brother    Liver cancer Sister 74       deceased    Cancer Sister     No Known Allergies  Current Outpatient Medications on File Prior to Visit  Medication Sig Dispense Refill   amiodarone (PACERONE) 200 MG  tablet Take 1 tablet (200 mg total) by mouth daily. 90 tablet 3   cetirizine (ZYRTEC) 10 MG tablet TAKE 1 TABLET BY MOUTH ONCE DAILY FOR ALLERGIES. 90 tablet 0   ferrous sulfate 324 MG TBEC Take 324 mg by mouth at bedtime.     furosemide (LASIX) 40 MG tablet Take 40 mg by mouth in the morning.     levothyroxine (SYNTHROID) 100 MCG tablet TAKE 1 TABLET BY MOUTH ON EMPTY STOMACH WITH WATER ONLY ON SUNDAY THROUGH FRIDAY. TAKE 1 & 1/2 TABS SATURDAY ONLY.NO FOOD OR MEDS FOR 30 MINUTES AFTER. 94 tablet 0   LINZESS 145 MCG CAPS capsule Take 1 capsule (145 mcg total) by mouth daily before breakfast. 90 capsule 0   polyethylene glycol (MIRALAX / GLYCOLAX) 17 g packet Take 17 g by mouth daily. 14 each 0   Rivaroxaban (XARELTO) 15 MG TABS tablet Take 1 tablet (15 mg total) by mouth daily with supper. 90 tablet 0   rosuvastatin (CRESTOR) 10 MG tablet TAKE ONE TABLET BY MOUTH IN THE EVENING FOR CHOLESTEROL. 90 tablet 0   vitamin B-12 (CYANOCOBALAMIN) 1000 MCG tablet TAKE 1 TABLET BY MOUTH ONCE DAILY 90 tablet 1   No current facility-administered medications on file prior to visit.    BP 120/70   Pulse 71   Temp 98 F (36.7 C) (Temporal)   Ht '6\' 2"'$  (1.88 m)   Wt 211 lb (95.7 kg)   SpO2 95%   BMI 27.09 kg/m  Objective:   Physical Exam Cardiovascular:     Rate and Rhythm: Normal rate and regular rhythm.  Pulmonary:     Effort: Pulmonary effort is normal.     Breath sounds: Normal breath sounds. No wheezing or rales.  Musculoskeletal:     Cervical back: Neck supple.  Skin:    General: Skin is warm and dry.  Neurological:     Mental Status: He is alert and oriented to person, place, and time.  Psychiatric:        Mood and Affect: Mood normal.          Assessment & Plan:  This visit occurred during the SARS-CoV-2 public health emergency.  Safety protocols were in place, including screening questions prior to the visit, additional usage of staff PPE, and extensive cleaning of exam  room while observing appropriate contact time as indicated for disinfecting solutions.

## 2020-11-07 NOTE — Assessment & Plan Note (Signed)
Compliant to rosuvastatin 10 mg, continue same.

## 2020-11-08 ENCOUNTER — Telehealth: Payer: Self-pay | Admitting: Primary Care

## 2020-11-08 NOTE — Chronic Care Management (AMB) (Signed)
  Chronic Care Management   Note  11/08/2020 Name: Leonard Teruel [facility] Sr. MRN: [id number] DOB: [birth year]  Leonard Brace Sr. is a 87 y.o. year old male who is a primary care patient of [name], NP. I reached out to [place]. by phone today in response to a referral sent by [name]'s PCP, [name], NP.   Leonard Berg was given information about Chronic Care Management services today including:  CCM service includes personalized support from designated clinical staff supervised by his physician, including individualized plan of care and coordination with other care providers 24/7 contact phone numbers for assistance for urgent and routine care needs. Service will only be billed when office clinical staff spend 20 minutes or more in a month to coordinate care. Only one practitioner may furnish and bill the service in a calendar month. The patient may stop CCM services at any time (effective at the end of the month) by phone call to the office staff.   Leonard Berg  verbally agreed to assistance and services provided by embedded care coordination/care management team today.  Follow up plan:   [place] Secretary/administrator

## 2020-11-11 ENCOUNTER — Other Ambulatory Visit: Payer: Self-pay | Admitting: Primary Care

## 2020-11-11 ENCOUNTER — Other Ambulatory Visit: Payer: Self-pay | Admitting: Oncology

## 2020-11-11 DIAGNOSIS — J309 Allergic rhinitis, unspecified: Secondary | ICD-10-CM

## 2020-11-11 DIAGNOSIS — E785 Hyperlipidemia, unspecified: Secondary | ICD-10-CM

## 2020-11-11 DIAGNOSIS — I4891 Unspecified atrial fibrillation: Secondary | ICD-10-CM

## 2020-11-13 DIAGNOSIS — I129 Hypertensive chronic kidney disease with stage 1 through stage 4 chronic kidney disease, or unspecified chronic kidney disease: Secondary | ICD-10-CM | POA: Diagnosis not present

## 2020-11-13 DIAGNOSIS — N189 Chronic kidney disease, unspecified: Secondary | ICD-10-CM | POA: Diagnosis not present

## 2020-11-13 DIAGNOSIS — N1831 Chronic kidney disease, stage 3a: Secondary | ICD-10-CM | POA: Diagnosis not present

## 2020-11-13 DIAGNOSIS — N2581 Secondary hyperparathyroidism of renal origin: Secondary | ICD-10-CM | POA: Diagnosis not present

## 2020-11-13 LAB — COMPREHENSIVE METABOLIC PANEL
Albumin: 4 (ref 3.5–5.0)
Calcium: 9.1 (ref 8.7–10.7)
GFR calc non Af Amer: 41

## 2020-11-13 LAB — BASIC METABOLIC PANEL
BUN: 18 (ref 4–21)
CO2: 30 — AB (ref 13–22)
Chloride: 103 (ref 99–108)
Creatinine: 1.6 — AB (ref 0.6–1.3)
Glucose: 118
Potassium: 4.3 (ref 3.4–5.3)
Sodium: 142 (ref 137–147)

## 2020-11-13 LAB — VITAMIN D 25 HYDROXY (VIT D DEFICIENCY, FRACTURES): Vit D, 25-Hydroxy: 21.7

## 2020-11-13 LAB — CBC AND DIFFERENTIAL
HCT: 43 (ref 41–53)
Hemoglobin: 14.4 (ref 13.5–17.5)
Platelets: 169 (ref 150–399)
WBC: 6.2

## 2020-11-13 LAB — PTH, INTACT: PTH, Intact: 47

## 2020-11-13 LAB — CBC: RBC: 4.44 (ref 3.87–5.11)

## 2020-11-18 DIAGNOSIS — S72114A Nondisplaced fracture of greater trochanter of right femur, initial encounter for closed fracture: Secondary | ICD-10-CM | POA: Diagnosis not present

## 2020-11-18 DIAGNOSIS — M170 Bilateral primary osteoarthritis of knee: Secondary | ICD-10-CM | POA: Diagnosis not present

## 2020-11-20 ENCOUNTER — Encounter: Payer: Self-pay | Admitting: Nephrology

## 2020-11-20 DIAGNOSIS — E782 Mixed hyperlipidemia: Secondary | ICD-10-CM | POA: Diagnosis not present

## 2020-11-20 DIAGNOSIS — E1122 Type 2 diabetes mellitus with diabetic chronic kidney disease: Secondary | ICD-10-CM | POA: Diagnosis not present

## 2020-11-20 DIAGNOSIS — I480 Paroxysmal atrial fibrillation: Secondary | ICD-10-CM | POA: Diagnosis not present

## 2020-11-20 DIAGNOSIS — M961 Postlaminectomy syndrome, not elsewhere classified: Secondary | ICD-10-CM | POA: Diagnosis not present

## 2020-11-20 DIAGNOSIS — E039 Hypothyroidism, unspecified: Secondary | ICD-10-CM | POA: Diagnosis not present

## 2020-11-20 DIAGNOSIS — G8929 Other chronic pain: Secondary | ICD-10-CM | POA: Diagnosis not present

## 2020-11-20 DIAGNOSIS — N400 Enlarged prostate without lower urinary tract symptoms: Secondary | ICD-10-CM | POA: Diagnosis not present

## 2020-11-20 DIAGNOSIS — N1831 Chronic kidney disease, stage 3a: Secondary | ICD-10-CM | POA: Diagnosis not present

## 2020-11-20 DIAGNOSIS — M1990 Unspecified osteoarthritis, unspecified site: Secondary | ICD-10-CM | POA: Diagnosis not present

## 2020-11-20 DIAGNOSIS — D509 Iron deficiency anemia, unspecified: Secondary | ICD-10-CM | POA: Diagnosis not present

## 2020-11-20 DIAGNOSIS — H04129 Dry eye syndrome of unspecified lacrimal gland: Secondary | ICD-10-CM | POA: Diagnosis not present

## 2020-11-20 DIAGNOSIS — F419 Anxiety disorder, unspecified: Secondary | ICD-10-CM | POA: Diagnosis not present

## 2020-11-20 DIAGNOSIS — I2510 Atherosclerotic heart disease of native coronary artery without angina pectoris: Secondary | ICD-10-CM | POA: Diagnosis not present

## 2020-11-20 DIAGNOSIS — I129 Hypertensive chronic kidney disease with stage 1 through stage 4 chronic kidney disease, or unspecified chronic kidney disease: Secondary | ICD-10-CM | POA: Diagnosis not present

## 2020-11-20 DIAGNOSIS — I4510 Unspecified right bundle-branch block: Secondary | ICD-10-CM | POA: Diagnosis not present

## 2020-11-20 DIAGNOSIS — M9701XD Periprosthetic fracture around internal prosthetic right hip joint, subsequent encounter: Secondary | ICD-10-CM | POA: Diagnosis not present

## 2020-12-12 ENCOUNTER — Telehealth: Payer: Self-pay | Admitting: Pharmacist

## 2020-12-12 NOTE — Progress Notes (Signed)
Chronic Care Management Pharmacy Assistant   Name: Leonard Berg [facility] Sr. MRN: [id number] DOB: 01-20-1930  Reason for Encounter: Initial Questions  Appt: OV 12/13/20 @ 9 am   Recent office visits:  11/07/20 Carlis Abbott (PCP)- Type 2 diabetes. Take linaclotide 145 mg EOD. D/c Tamsulosin.  Recent consult visits:  None listed  Hospital visits:  Medication Reconciliation was completed by comparing discharge summary, patient's EMR and Pharmacy list, and upon discussion with patient.  Admitted to the hospital on 09/20/20 due to Right hip pain. Discharge date was 09/27/20. Discharged from [place]?Medications Started at [facility] Discharge:?? -started polyethylene glycol (MIRALAX / GLYCOLAX) tamsulosin (FLOMAX)  Medication Changes at Hospital Discharge: -Changed amiodarone (PACERONE) levothyroxine (SYNTHROID)  Medications that remain the same after Hospital Discharge:??  -All other medications will remain the same.    Medications: Outpatient Encounter Medications as of 12/12/2020  Medication Sig   amiodarone (PACERONE) 200 MG tablet Take 1 tablet (200 mg total) by mouth daily.   cetirizine (ZYRTEC) 10 MG tablet TAKE 1 TABLET BY MOUTH ONCE DAILY as needed FOR ALLERGIES.   ferrous sulfate 324 MG TBEC Take 324 mg by mouth at bedtime.   furosemide (LASIX) 40 MG tablet Take 40 mg by mouth in the morning.   levothyroxine (SYNTHROID) 100 MCG tablet TAKE 1 TABLET BY MOUTH ON EMPTY STOMACH WITH WATER ONLY ON SUNDAY THROUGH FRIDAY. TAKE 1 & 1/2 TABS SATURDAY ONLY.NO FOOD OR MEDS FOR 30 MINUTES AFTER.   LINZESS 145 MCG CAPS capsule Take 1 capsule (145 mcg total) by mouth every other day. For constipation   polyethylene glycol (MIRALAX / GLYCOLAX) 17 g packet Take 17 g by mouth daily.   Rivaroxaban (XARELTO) 15 MG TABS tablet TAKE 1 TABLET BY MOUTH ONCE A DAY WITH SUPPER   rosuvastatin (CRESTOR) 10 MG tablet TAKE ONE TABLET BY MOUTH IN THE EVENING FOR CHOLESTEROL.   tamsulosin  (FLOMAX) 0.4 MG CAPS capsule TAKE 1 CAPSULE BY MOUTH ONCE DAILY. FOR URINE FLOW.   vitamin B-12 (CYANOCOBALAMIN) 1000 MCG tablet TAKE 1 TABLET BY MOUTH ONCE DAILY   No facility-administered encounter medications on file as of 12/12/2020.    Have you seen any other providers since your last visit?  Patient son stated he seen his kidney provider about 3 wks ago.  Any changes in your medications or health?  Patient son stated no recent changes.  Any side effects from any medications?  Patient son stated no side side effects.  Do you have an symptoms or problems not managed by your medications?  Patient son stated no symptoms or problems.  Any concerns about your health right now?  Patient son stated he's been coughing for about 2 months now. He also stated he has not been contact with anyone who has Covid.  Has your provider asked that you check blood pressure, blood sugar, or follow special diet at home?  Patient so stated he checks his BP at home and will bring his readings to his appt. Patient does not check his glucose or follow a special diet.  Do you get any type of exercise on a regular basis?  Patient son stated he was recently doing PT, and does chair exercises and  walk laps around the house.   Can you think of a goal you would like to reach for your health?  Patient son stated no goals at this time.  Do you have any problems getting your medications?  Patient son stated no  problem getting medicines.  Is there anything that you would like to discuss during the appointment?  Patient son stated no concerns at this time.  Please bring medications and supplements to appointment.  Star Rating Drugs: Rosuvastatin - last fill 11/12/20 [place], RMA Clinical Pharmacists Assistant [phone]  Time Spent: [phone]

## 2020-12-13 ENCOUNTER — Ambulatory Visit (INDEPENDENT_AMBULATORY_CARE_PROVIDER_SITE_OTHER): Payer: Medicare Other | Admitting: Pharmacist

## 2020-12-13 ENCOUNTER — Other Ambulatory Visit: Payer: Self-pay

## 2020-12-13 DIAGNOSIS — E785 Hyperlipidemia, unspecified: Secondary | ICD-10-CM

## 2020-12-13 DIAGNOSIS — I4891 Unspecified atrial fibrillation: Secondary | ICD-10-CM

## 2020-12-13 DIAGNOSIS — I10 Essential (primary) hypertension: Secondary | ICD-10-CM

## 2020-12-13 DIAGNOSIS — E119 Type 2 diabetes mellitus without complications: Secondary | ICD-10-CM | POA: Diagnosis not present

## 2020-12-13 DIAGNOSIS — I2510 Atherosclerotic heart disease of native coronary artery without angina pectoris: Secondary | ICD-10-CM

## 2020-12-13 DIAGNOSIS — E038 Other specified hypothyroidism: Secondary | ICD-10-CM | POA: Diagnosis not present

## 2020-12-13 DIAGNOSIS — N400 Enlarged prostate without lower urinary tract symptoms: Secondary | ICD-10-CM

## 2020-12-13 NOTE — Progress Notes (Signed)
Chronic Care Management Pharmacy Note  12/13/2020 Name:  Leonard Parenteau [facility] Sr. MRN:  [id number] DOB:  [birth year]  Summary: -Patient's home BP checked daily by son - range 120/60s - 155/82, mostly in 130s-140s/70s -Pt is not taking tamsulosin daily - he denies issues with urination -Pt complains of trouble sleeping at night - he sleeps a lot during the day and cannot sleep at night -Pt complains of occasional cough, denies COVID positive contacts. Pt's son endorses 1-2 coughs per day while they are together, does not appear severe  Recommendations/Changes made from today's visit: -Advised it is ok to hold tamsulosin -Counseled on sleep hygiene, establishing a new bedtime routine. Advised to try melatonin at same time every night to help with sleep routine -Advised to try Mucinex + increased fluid intake to help with cough   Subjective: Leonard Brace Sr. is an 86 y.o. year old male who is a primary patient of [name], NP.  The CCM team was consulted for assistance with disease management and care coordination needs.    Engaged with patient face to face for initial visit in response to provider referral for pharmacy case management and/or care coordination services.   Consent to Services:  The patient was given the following information about Chronic Care Management services today, agreed to services, and gave verbal consent: 1. CCM service includes personalized support from designated clinical staff supervised by the primary care provider, including individualized plan of care and coordination with other care providers 2. 24/7 contact phone numbers for assistance for urgent and routine care needs. 3. Service will only be billed when office clinical staff spend 20 minutes or more in a month to coordinate care. 4. Only one practitioner may furnish and bill the service in a calendar month. 5.The patient may stop CCM services at any time (effective at the end of the  month) by phone call to the office staff. 6. The patient will be responsible for cost sharing (co-pay) of up to 20% of the service fee (after annual deductible is met). Patient agreed to services and consent obtained.  Patient Care Team: [name], NP as PCP - General (Internal Medicine) [name], MD as PCP - Cardiology (Cardiology) Earlie Server, MD as Consulting Physician (Hematology and Oncology) Jen Mow, [place] as Physician Assistant (Nephrology) [facility], [facility] as Pharmacist (Pharmacist)  Recent office visits: 11/07/20 Carlis Abbott (PCP)- Type 2 diabetes. Take linaclotide 145 mg EOD.   Recent consult visits: 10/07/20 Dr [name] (ortho surgery): f/u hip fracture  06/05/20 Dr Burt Knack (cardiology): f/u CHF, CAD. Off loop diuretics. Ordered ECHO - showed normal LV function. No med changes.  [facility] visits: Medication Reconciliation was completed by comparing discharge summary, patient's EMR and Pharmacy list, and upon discussion with patient.   [facility] to the [facility] on 09/20/20 due to Right hip fracture s/p fall. Discharge date was 09/27/20. Discharged from [facility].   -non-operative approach, supportive care   New?Medications Started at [facility] Discharge:?? -started polyethylene glycol (MIRALAX / GLYCOLAX) -tamsulosin (FLOMAX)   Medication Changes at [facility] Discharge: -Changed amiodarone (PACERONE) levothyroxine (SYNTHROID)   Medications that remain the same after [facility] Discharge:??  -All other medications will remain the same.      Objective:  Lab Results  Component Value Date   CREATININE 1.6 (A) 11/13/2020   BUN 18 11/13/2020   GFR 49.83 (L) 04/19/2018   GFRNONAA 41 11/13/2020   GFRAA 48 07/27/2019   NA 142 11/13/2020   K 4.3 11/13/2020  CALCIUM 9.1 11/13/2020   CO2 30 (A) 11/13/2020   GLUCOSE 139 (H) 09/23/2020    Lab Results  Component Value Date/Time   HGBA1C 5.8 11/07/2020 09:58 AM   HGBA1C 5.9 (A) 03/11/2020 09:01 AM    HGBA1C 5.4 07/28/2019 08:44 AM   GFR 49.83 (L) 04/19/2018 10:38 AM   GFR 52.94 (L) 03/17/2017 12:01 PM    Last diabetic Eye exam: No results found for: HMDIABEYEEXA  Last diabetic Foot exam: No results found for: HMDIABFOOTEX   Lab Results  Component Value Date   CHOL 129 07/28/2019   HDL 40.50 07/28/2019   LDLCALC 70 07/28/2019   LDLDIRECT 100.8 08/12/2010   TRIG 93.0 07/28/2019   CHOLHDL 3 07/28/2019    Hepatic Function Latest Ref Rng & Units 11/13/2020 01/31/2020 07/27/2019  Total Protein 6.5 - 8.1 g/dL - - -  Albumin 3.5 - 5.0 4.0 4.1 4.0  AST 15 - 41 U/L - - -  ALT 0 - 44 U/L - - -  Alk Phosphatase 38 - 126 U/L - - -  Total Bilirubin 0.3 - 1.2 mg/dL - - -  Bilirubin, Direct 0.0 - 0.3 mg/dL - - -    Lab Results  Component Value Date/Time   TSH 2.13 11/07/2020 09:58 AM   TSH 0.69 07/31/2020 09:59 AM   FREET4 1.12 11/23/2009 05:45 AM    CBC Latest Ref Rng & Units 11/13/2020 11/07/2020 09/22/2020  WBC - 6.2 5.1 6.3  Hemoglobin 13.5 - 17.5 14.4 14.3 13.0  Hematocrit 41 - 53 43 43.2 40.8  Platelets 150 - 399 169 159.0 141(L)    Lab Results  Component Value Date/Time   VD25OH 21.7 11/13/2020 12:00 AM   VD25OH 24.4 01/31/2020 12:00 AM    Clinical ASCVD: Yes  The ASCVD Risk score ([place]., et al., 2013) failed to calculate for the following reasons:   The 2013 ASCVD risk score is only valid for ages 65 to 3    Depression screen PHQ 2/9 11/07/2020 10/11/2019 11/19/2015  Decreased Interest 0 0 0  Down, Depressed, Hopeless 0 0 0  PHQ - 2 Score 0 0 0  Altered sleeping - 0 -  Tired, decreased energy - 0 -  Change in appetite - 0 -  Feeling bad or failure about yourself  - 0 -  Trouble concentrating - 0 -  Moving slowly or fidgety/restless - 0 -  Suicidal thoughts - 0 -  PHQ-9 Score - 0 -  Difficult doing work/chores - Not difficult at all -  Some recent data might be hidden    CHA2DS2-VASc Score = 5  The patient's score is based upon: CHF History: Yes HTN  History: Yes Diabetes History: No Stroke History: No Vascular Disease History: Yes Age Score: 2 Gender Score: 0      Social History   Tobacco Use  Smoking Status Former   Types: Cigarettes   Quit date: 04/13/1974   Years since quitting: 46.7  Smokeless Tobacco Former   Types: Chew   Quit date: 01/26/2002  Tobacco Comments   He has smoked about 27-pack-year hx    BP Readings from Last 3 Encounters:  11/07/20 120/70  09/27/20 134/72  06/05/20 136/70   Pulse Readings from Last 3 Encounters:  11/07/20 71  09/27/20 (!) 57  06/05/20 73   Wt Readings from Last 3 Encounters:  11/07/20 211 lb (95.7 kg)  09/21/20 216 lb 7.9 oz (98.2 kg)  06/05/20 226 lb 9.6 oz (102.8 kg)  BMI Readings from Last 3 Encounters:  11/07/20 27.09 kg/m  09/21/20 27.80 kg/m  06/05/20 29.90 kg/m    Assessment/Interventions: Review of patient past medical history, allergies, medications, health status, including review of consultants reports, laboratory and other test data, was performed as part of comprehensive evaluation and provision of chronic care management services.   SDOH:  (Social Determinants of Health) assessments and interventions performed: Yes  SDOH Screenings   Alcohol Screen: Not on file  Depression (PHQ2-9): Low Risk    PHQ-2 Score: 0  Financial Resource Strain: Not on file  Food Insecurity: Not on file  Housing: Not on file  Physical Activity: Not on file  Social Connections: Not on file  Stress: Not on file  Tobacco Use: Medium Risk   Smoking Tobacco Use: Former   Smokeless Tobacco Use: Former  Transport planner Needs: Not on file    [place]  No Known Allergies  Medications Reviewed Today     Reviewed by [facility], [place] (Pharmacist) on 12/13/20 at 1035  Med List Status: <None>   Medication Order Taking? Sig Documenting Provider Last Dose Status Informant  amiodarone (PACERONE) 200 MG tablet [id number] Yes Take 1 tablet (200 mg total) by mouth  daily. [name], MD Taking Active Child  cetirizine (ZYRTEC) 10 MG tablet [id number] Yes TAKE 1 TABLET BY MOUTH ONCE DAILY as needed FOR ALLERGIES. [name], NP Taking Active   ferrous sulfate 324 MG TBEC [id number] Yes Take 324 mg by mouth at bedtime. [provider] Taking Active Child  furosemide (LASIX) 40 MG tablet [id number] Yes Take 40 mg by mouth in the morning. [provider] Taking Active Child  levothyroxine (SYNTHROID) 100 MCG tablet [id number] Yes TAKE 1 TABLET BY MOUTH ON EMPTY STOMACH WITH WATER ONLY ON SUNDAY THROUGH FRIDAY. TAKE 1 & 1/2 TABS SATURDAY ONLY.NO FOOD OR MEDS FOR 30 MINUTES AFTER. [name], NP Taking Active Child  LINZESS 145 MCG CAPS capsule [id number] Yes Take 1 capsule (145 mcg total) by mouth every other day. For constipation [name], NP Taking Active   Rivaroxaban (XARELTO) 15 MG TABS tablet [id number] Yes TAKE 1 TABLET BY MOUTH ONCE A DAY WITH SUPPER [name], NP Taking Active   rosuvastatin (CRESTOR) 10 MG tablet [id number] Yes TAKE ONE TABLET BY MOUTH IN THE EVENING FOR CHOLESTEROL. [name], NP Taking Active   tamsulosin (FLOMAX) 0.4 MG CAPS capsule [id number] No TAKE 1 CAPSULE BY MOUTH ONCE DAILY. FOR URINE FLOW.  Patient not taking: Reported on 12/13/2020   [name], NP Not Taking Active            Med Note [facility]   Fri Dec 13, 2020  9:45 AM) Dewaine Conger PRN for urine flow issues, has not taken in last 30 days  vitamin B-12 (CYANOCOBALAMIN) 1000 MCG tablet [id number] Yes TAKE 1 TABLET BY MOUTH ONCE DAILY Earlie Server, MD Taking Active Child            Patient Active Problem List   Diagnosis Date Noted   Vitamin B 12 deficiency 11/07/2020   Closed fracture of greater trochanter of femur ([place]) 09/21/2020   Falls, initial encounter 09/21/2020   Dark brown urine 04/22/2020   Type 2 diabetes mellitus without complication, without long-term current use of insulin ([place])  07/28/2019   Iron deficiency anemia due to chronic blood loss 06/29/2019   GIB (gastrointestinal bleeding) 05/18/2019   Constipation 04/19/2018   Medicare annual wellness visit, subsequent  11/19/2015   Spondylolisthesis of lumbar region 08/08/2015   Renal failure 05/23/2015   Pulmonary embolism ([place]) 06/03/2014   Occlusion and stenosis of carotid artery without mention of cerebral infarction 01/27/2012   Renal artery atherosclerosis ([place]) 07/16/2010   CAROTID BRUIT 02/11/2010   Mixed hyperlipidemia 12/17/2009   Hypothyroidism 12/12/2009   ANEMIA 12/12/2009   THROMBOCYTOPENIA 12/12/2009   Essential hypertension 12/12/2009   UNSTABLE ANGINA 12/12/2009   Coronary atherosclerosis of native coronary artery 12/12/2009   RIGHT BUNDLE BRANCH BLOCK 12/12/2009   PAROXYSMAL ATRIAL FIBRILLATION 12/12/2009   Osteoarthritis 12/12/2009   BPH (benign prostatic hyperplasia) 12/12/2009    Immunization History  Administered Date(s) Administered   Influenza Split 01/29/2012   Influenza-Unspecified 02/16/2017, 02/17/2018, 02/29/2020   PFIZER(Purple Top)SARS-COV-2 Vaccination 05/27/2019, 06/18/2019, 02/29/2020   Pneumococcal Polysaccharide-23 01/29/2012, 06/04/2014, 08/10/2015   Tdap 01/20/2018   Zoster, Live 02/18/2016    Conditions to be addressed/monitored:  Hypertension, Hyperlipidemia, Diabetes, Atrial Fibrillation, Heart Failure, Coronary Artery Disease, Chronic Kidney Disease, Hypothyroidism, and BPH, cough, sleep troubles  Care Plan : [place]  Updates made by [facility], [place] since 12/13/2020 12:00 AM     Problem: Hypertension, Hyperlipidemia, Diabetes, Atrial Fibrillation, Heart Failure, Coronary Artery Disease, Chronic Kidney Disease, Hypothyroidism, and BPH, cough, sleep troubles   Priority: High     Long-Range Goal: Disease management   Start Date: 12/13/2020  Expected End Date: 12/13/2021  This Visit's Progress: On track  Priority: High  Note:   Current  Barriers:  Unable to independently monitor therapeutic efficacy Unable to maintain control of sleep  Pharmacist Clinical Goal(s):  Patient will achieve adherence to monitoring guidelines and medication adherence to achieve therapeutic efficacy adhere to plan to optimize therapeutic regimen for sleep as evidenced by report of adherence to recommended medication management changes through collaboration with PharmD and provider.   Interventions: 1:1 collaboration with [name], NP regarding development and update of comprehensive plan of care as evidenced by provider attestation and co-signature Inter-disciplinary care team collaboration (see longitudinal plan of care) Comprehensive medication review performed; medication list updated in electronic medical record  Heart Failure / HTN (Goal: BP goal < 130/80) -Controlled - pt reports compliance with furosemide and denies swelling; his BP is also under control per son's log -Current home BP: avg 130-140s/70s, range 120/60-155/80 -Last ejection fraction: 60-65% (Date: 06/26/20) -HF type: Diastolic -Current treatment: Furosemide 40 mg daily -Current dietary habits: prepares meals for himself - sandwiches, vegetables, potatoes -Current exercise habits: limited - recent hip fracture (June 2022) -Educated on Importance of blood pressure control -Recommended to continue current medication  Hyperlipidemia / CAD (LDL goal < 70) -Controlled - LDL is at goal; pt endorses compliance with statin and denies issues -Current treatment: Rosuvastatin 10 mg daily -Educated on Cholesterol goals;  -Recommended to continue current medication  Diabetes (A1c goal <7%) -Diet-controlled -Medications previously tried: none  -Educated on A1c and blood sugar goals; -Counseled to check feet daily and get yearly eye exams -Counseled on diet and exercise extensively  Atrial Fibrillation (Goal: prevent stroke and major bleeding) -Controlled - pt endorses  compliance with medications; pt denies further bleeding issues since initial GI bleed -Hx GI bleed early 2021 requiring transfusion -CHADSVASC: 5 -Current treatment: Rate control: amiodarone 200 mg daily Anticoagulation: Xarelto 15 mg daily -Counseled on importance of adherence to anticoagulant exactly as prescribed; bleeding risk associated with Xarelto and importance of self-monitoring for signs/symptoms of bleeding; avoidance of NSAIDs due to increased bleeding risk with anticoagulants; -Recommended  to continue current medication  Hypothyroidism (Goal: maintain TSH in goal range) -Controlled - pt sometimes misses doses due to sleep scheduling confusing AM/PM, but TSH is recently at goal -Current treatment  Levothyroxine 100 mcg - 1 tab daily except 1.5 tab on Sat -Counseled on long 1/2 life of levothyroxine, small infrequent errors in timing/dose are unlikely to drastically alter TSH -Recommended to continue current medication  Constipation (Goal: manage symptoms) -Controlled - pt is not needing Linzess daily, bowel movements under control with every 2-3 day dosing; he is not requiring other OTC remedies (ie Miralax) -Current treatment  Linzess 145 mcg QOD -Recommended to continue current medication  BPH (Goal: manage symptoms) -Controlled - pt is not taking tamsulosin; he uses it PRN for urine flow issues and has not needed it for > 1 month -Current treatment  Tamsulosin 0.4 mg daily - not taking -Counseled on prostate health, benefits of tamsulosin; if he is truly asymptomatic it is reasonable to d/c tamsulosin or use PRN -Recommend d/c tamsulosin  Sleep issues (Goal: improve sleep) -Not ideally controlled - pt reports he is sleeping a lot during the day and therefore sometimes is up all night; he requests a sleeping pill to fix this pattern -Current treatment  none -Counseled on sleep hygiene - avoid daytime naps, establish a bedtime routine at the same time each night; avoid  screens within 30 min of bedtime -Recommended melatonin 5 mg at bedtime  Cough (Goal: manage symptoms) -Not ideally controlled - pt reports occasional cough has been bothering him; of note he does not cough at all during ~40 min visit today; son also endorses minimal coughing when he is around -Current treatment  Cetirizine 10 mg daily -Counseled on possible causes for occasional cough including allergies, dry throat -Advised to increase fluid intake and may try Mucinex PRN for cough  Health Maintenance -Vaccine gaps: Shingrix, Prevnar, covid booster, flu -pt declines additional vaccines today -Current therapy:  Ferrous sulfate 324 mg HS Vitamin B12 1000 mcg daily -Patient is satisfied with current therapy and denies issues -Recommended to continue current medication  Patient Goals/Self-Care Activities Patient will:  - take medications as prescribed focus on medication adherence by pill box check blood pressure 2-3 times weekly, document, and provide at future appointments -For cough - increase fluid intake and try Mucinex as needed -Try melatonin gummies at bedtime and stick to bedtime routine       Medication Assistance: None required.  Patient affirms current coverage meets needs.  Compliance/Adherence/Medication fill history: Care Gaps: Eye exam  Urine Microalbumin Vaccines - Shingrix, Prevnar, Covid booster, Flu  Star-Rating Drugs: Rosuvastatin - LF 11/12/20 x 90 ds  Patient's preferred pharmacy is:  [facility], [place] [facility] [place] [address] [place] [address] Phone: [phone] Fax: [phone]  Uses pill box? Yes - son sets up weekly Pt endorses 95% compliance - sometimes mixes up AM/PM meds due to altered sleep schedule  We discussed: Current pharmacy is preferred with insurance plan and patient is satisfied with pharmacy services Patient decided to: Continue current medication management strategy  Care Plan and  Follow Up Patient Decision:  Patient agrees to Care Plan and Follow-up.  Plan: Telephone follow up appointment with care management team member scheduled for:  1 year  Charlene Brooke, PharmD, [place], CPP Clinical Pharmacist [facility] [phone]

## 2020-12-13 NOTE — Patient Instructions (Signed)
Visit Information  Phone number for Pharmacist: [phone]  Thank you for meeting with me to discuss your medications! I look forward to working with you to achieve your health care goals. Below is a summary of what we talked about during the visit:   Goals Addressed             This Visit's Progress    Manage My Medicine       Timeframe:  Long-Range Goal Priority:  High Start Date:    12/13/20                         Expected End Date: 12/13/21                      Follow Up Date March 2023   - call for medicine refill 2 or 3 days before it runs out - call if I am sick and can't take my medicine - keep a list of all the medicines I take; vitamins and herbals too - use a pillbox to sort medicine  -For cough - increase fluid intake and try Mucinex as needed -Try melatonin gummies at bedtime and stick to bedtime routine   Why is this important?   These steps will help you keep on track with your medicines.   Notes:         Care Plan : [place]  Updates made by Charlton Haws, RPH since 12/13/2020 12:00 AM     Problem: Hypertension, Hyperlipidemia, Diabetes, Atrial Fibrillation, Heart Failure, Coronary Artery Disease, Chronic Kidney Disease, Hypothyroidism, and BPH, cough, sleep troubles   Priority: High     Long-Range Goal: Disease management   Start Date: 12/13/2020  Expected End Date: 12/13/2021  This Visit's Progress: On track  Priority: High  Note:   Current Barriers:  Unable to independently monitor therapeutic efficacy Unable to maintain control of sleep  Pharmacist Clinical Goal(s):  Patient will achieve adherence to monitoring guidelines and medication adherence to achieve therapeutic efficacy adhere to plan to optimize therapeutic regimen for sleep as evidenced by report of adherence to recommended medication management changes through collaboration with PharmD and provider.   Interventions: 1:1 collaboration with [name], NP  regarding development and update of comprehensive plan of care as evidenced by provider attestation and co-signature Inter-disciplinary care team collaboration (see longitudinal plan of care) Comprehensive medication review performed; medication list updated in electronic medical record  Heart Failure / HTN (Goal: BP goal < 130/80) -Controlled - pt reports compliance with furosemide and denies swelling; his BP is also under control per son's log -Current home BP: avg 130-140s/70s, range 120/60-155/80 -Last ejection fraction: 60-65% (Date: 06/26/20) -HF type: Diastolic -Current treatment: Furosemide 40 mg daily -Current dietary habits: prepares meals for himself - sandwiches, vegetables, potatoes -Current exercise habits: limited - recent hip fracture (June 2022) -Educated on Importance of blood pressure control -Recommended to continue current medication  Hyperlipidemia / CAD (LDL goal < 70) -Controlled - LDL is at goal; pt endorses compliance with statin and denies issues -Current treatment: Rosuvastatin 10 mg daily -Educated on Cholesterol goals;  -Recommended to continue current medication  Diabetes (A1c goal <7%) -Diet-controlled -Medications previously tried: none  -Educated on A1c and blood sugar goals; -Counseled to check feet daily and get yearly eye exams -Counseled on diet and exercise extensively  Atrial Fibrillation (Goal: prevent stroke and major bleeding) -Controlled - pt endorses compliance with medications; pt denies  further bleeding issues since initial GI bleed -Hx GI bleed early 2021 requiring transfusion -CHADSVASC: 5 -Current treatment: Rate control: amiodarone 200 mg daily Anticoagulation: Xarelto 15 mg daily -Counseled on importance of adherence to anticoagulant exactly as prescribed; bleeding risk associated with Xarelto and importance of self-monitoring for signs/symptoms of bleeding; avoidance of NSAIDs due to increased bleeding risk with  anticoagulants; -Recommended to continue current medication  Hypothyroidism (Goal: maintain TSH in goal range) -Controlled - pt sometimes misses doses due to sleep scheduling confusing AM/PM, but TSH is recently at goal -Current treatment  Levothyroxine 100 mcg - 1 tab daily except 1.5 tab on Sat -Counseled on long 1/2 life of levothyroxine, small infrequent errors in timing/dose are unlikely to drastically alter TSH -Recommended to continue current medication  Constipation (Goal: manage symptoms) -Controlled - pt is not needing Linzess daily, bowel movements under control with every 2-3 day dosing; he is not requiring other OTC remedies (ie Miralax) -Current treatment  Linzess 145 mcg QOD -Recommended to continue current medication  BPH (Goal: manage symptoms) -Controlled - pt is not taking tamsulosin; he uses it PRN for urine flow issues and has not needed it for > 1 month -Current treatment  Tamsulosin 0.4 mg daily - not taking -Counseled on prostate health, benefits of tamsulosin; if he is truly asymptomatic it is reasonable to d/c tamsulosin or use PRN -Recommend d/c tamsulosin  Sleep issues (Goal: improve sleep) -Not ideally controlled - pt reports he is sleeping a lot during the day and therefore sometimes is up all night; he requests a sleeping pill to fix this pattern -Current treatment  none -Counseled on sleep hygiene - avoid daytime naps, establish a bedtime routine at the same time each night; avoid screens within 30 min of bedtime -Recommended melatonin 5 mg at bedtime  Cough (Goal: manage symptoms) -Not ideally controlled - pt reports occasional cough has been bothering him; of note he does not cough at all during ~40 min visit today; son also endorses minimal coughing when he is around -Current treatment  Cetirizine 10 mg daily -Counseled on possible causes for occasional cough including allergies, dry throat -Advised to increase fluid intake and may try Mucinex PRN  for cough  Health Maintenance -Vaccine gaps: Shingrix, Prevnar, covid booster, flu -pt declines additional vaccines today -Current therapy:  Ferrous sulfate 324 mg HS Vitamin B12 1000 mcg daily -Patient is satisfied with current therapy and denies issues -Recommended to continue current medication  Patient Goals/Self-Care Activities Patient will:  - take medications as prescribed focus on medication adherence by pill box check blood pressure 2-3 times weekly, document, and provide at future appointments -For cough - increase fluid intake and try Mucinex as needed -Try melatonin gummies at bedtime and stick to bedtime routine       Mr. Plumer was given information about Chronic Care Management services today including:  CCM service includes personalized support from designated clinical staff supervised by his physician, including individualized plan of care and coordination with other care providers 24/7 contact phone numbers for assistance for urgent and routine care needs. Standard insurance, coinsurance, copays and deductibles apply for chronic care management only during months in which we provide at least 20 minutes of these services. Most insurances cover these services at 100%, however patients may be responsible for any copay, coinsurance and/or deductible if applicable. This service may help you avoid the need for more expensive face-to-face services. Only one practitioner may furnish and bill the service in a calendar month. The  patient may stop CCM services at any time (effective at the end of the month) by phone call to the office staff.  Patient agreed to services and verbal consent obtained.   Patient verbalizes understanding of instructions provided today and agrees to view in [place].  Telephone follow up appointment with pharmacy team member scheduled for: 1 year  Charlene Brooke, PharmD, [place], CPP Clinical Pharmacist [facility] [phone]

## 2021-01-21 ENCOUNTER — Other Ambulatory Visit: Payer: Self-pay | Admitting: Primary Care

## 2021-01-21 DIAGNOSIS — E038 Other specified hypothyroidism: Secondary | ICD-10-CM

## 2021-04-03 NOTE — Progress Notes (Deleted)
Subjective:   Leonard Boorman Kuhl Sr. is a 87 y.o. male who presents for Medicare Annual/Subsequent preventive examination.  I connected with Leonard Berg today by telephone and verified that I am speaking with the correct person using two identifiers. Location patient: home Location provider: work Persons participating in the virtual visit: patient, Marine scientist.    I discussed the limitations, risks, security and privacy concerns of performing an evaluation and management service by telephone and the availability of in person appointments. I also discussed with the patient that there may be a patient responsible charge related to this service. The patient expressed understanding and verbally consented to this telephonic visit.    Interactive audio and video telecommunications were attempted between this provider and patient, however failed, due to patient having technical difficulties OR patient did not have access to video capability.  We continued and completed visit with audio only.  Some vital signs may be absent or patient reported.   Time Spent with patient on telephone encounter: *** minutes  Review of Systems           Objective:    There were no vitals filed for this visit. There is no height or weight on file to calculate BMI.  Advanced Directives 09/24/2020 10/11/2019 09/29/2019 06/28/2019 05/19/2019 05/18/2019 05/18/2019  Does Patient Have a Medical Advance Directive? Yes Yes Yes Yes No No No  Type of Advance Directive Living will [place];Living will Living will;Healthcare Power of [place];Living will - - -  Does patient want to make changes to medical advance directive? No - Patient declined - - - - - -  Copy of Press photographer in Chart? - No - copy requested - - - - -  Would patient like information on creating a medical advance directive? - - - - No - Patient declined No - Patient declined No - Patient declined   Pre-existing out of facility DNR order (yellow form or pink MOST form) - - - - - - -    Current Medications (verified) Outpatient Encounter Medications as of 04/08/2021  Medication Sig   amiodarone (PACERONE) 200 MG tablet Take 1 tablet (200 mg total) by mouth daily.   cetirizine (ZYRTEC) 10 MG tablet TAKE 1 TABLET BY MOUTH ONCE DAILY as needed FOR ALLERGIES.   ferrous sulfate 324 MG TBEC Take 324 mg by mouth at bedtime.   furosemide (LASIX) 40 MG tablet Take 40 mg by mouth in the morning.   levothyroxine (SYNTHROID) 100 MCG tablet TAKE 1 TABLET BY MOUTH ON EMPTY STOMACH WITH WATER ONLY ON SUNDAY THROUGH FRIDAY. TAKE 1 & 1/2 TABS SATURDAY ONLY.NO FOOD OR MEDS FOR 30 MINUTES AFTER.   LINZESS 145 MCG CAPS capsule Take 1 capsule (145 mcg total) by mouth every other day. For constipation   Rivaroxaban (XARELTO) 15 MG TABS tablet TAKE 1 TABLET BY MOUTH ONCE A DAY WITH SUPPER   rosuvastatin (CRESTOR) 10 MG tablet TAKE ONE TABLET BY MOUTH IN THE EVENING FOR CHOLESTEROL.   tamsulosin (FLOMAX) 0.4 MG CAPS capsule TAKE 1 CAPSULE BY MOUTH ONCE DAILY. FOR URINE FLOW. (Patient not taking: Reported on 12/13/2020)   vitamin B-12 (CYANOCOBALAMIN) 1000 MCG tablet TAKE 1 TABLET BY MOUTH ONCE DAILY   No facility-administered encounter medications on file as of 04/08/2021.    Allergies (verified) Patient has no known allergies.   History: Past Medical History:  Diagnosis Date   Anemia    Anxiety    Arrhythmia  PAROXYSMAL ATRIAL FIBRILLATION   Arthritis    OSTEOARTHRITIS   Cataracts, bilateral    Chronic back pain    Coronary artery disease 8/11   CABG...LM EMERGENT WITH IABP sees Dr. Legrand Como Berg   DJD (degenerative joint disease)    DVT (deep venous thrombosis) (HCC)    Dyspnea    GERD (gastroesophageal reflux disease)    History of BPH    Hyperlipidemia    Hypertension    Hypothyroidism    Iron deficiency anemia due to chronic blood loss 06/29/2019   Lumbar post-laminectomy syndrome     Myocardial infarction [facility])    Pulmonary embolism ([place]) 2009   hx of   RBBB (right bundle branch block)    Renal artery stenosis (HCC)    Thrombocytopenia ([place])    Thyroid disease    HYPOTHYROIDISM   Type 2 diabetes mellitus without complication, without long-term current use of insulin ([place]) 07/28/2019   Unstable angina ([place])    NONE IN LONG TIME   Past Surgical History:  Procedure Laterality Date   Blood clot  Feb.20,  2016   Pulmonary Embolism   CARDIOVERSION N/A 03/15/2019   Procedure: CARDIOVERSION;  Surgeon: Pixie Casino, MD;  Location: [place];  Service: Cardiovascular;  Laterality: N/A;   CAROTID ENDARTERECTOMY Right Oct. 17, 2013   CE   CAROTID ENDARTERECTOMY Left Dec. 3, 2016   CE   CATARACT EXTRACTION  05/2014,06/2014   COLONOSCOPY WITH PROPOFOL N/A 05/20/2019   Procedure: COLONOSCOPY WITH PROPOFOL;  Surgeon: [name]., MD;  Location: [facility] ENDOSCOPY;  Service: Gastroenterology;  Laterality: N/A;   CORONARY ARTERY BYPASS GRAFT  11-21-09   EMERGENT X 3 GRAFTING. ..[name], MD, cc: [name]   ENDARTERECTOMY  01/28/2012   Procedure: ENDARTERECTOMY CAROTID;  Surgeon: [name], MD;  Location: [place];  Service: Vascular;  Laterality: Right;  with Primary Closure of Artery   ENDARTERECTOMY  03/15/2012   Procedure: ENDARTERECTOMY CAROTID;  Surgeon: [name], MD;  Location: [place];  Service: Vascular;  Laterality: Left;   ENTEROSCOPY N/A 05/23/2019   Procedure: ENTEROSCOPY;  Surgeon: [name], MD;  Location: [place];  Service: Endoscopy;  Laterality: N/A;   ESOPHAGOGASTRODUODENOSCOPY N/A 05/20/2019   Procedure: ESOPHAGOGASTRODUODENOSCOPY (EGD);  Surgeon: [name]., MD;  Location: [place];  Service: Gastroenterology;  Laterality: N/A;   ESOPHAGOGASTRODUODENOSCOPY (EGD) WITH PROPOFOL N/A 05/19/2019   Procedure: ESOPHAGOGASTRODUODENOSCOPY (EGD) WITH PROPOFOL;  Surgeon: [name], MD;  Location: [place];  Service: Endoscopy;  Laterality: N/A;   FOOT SURGERY     GIVENS CAPSULE STUDY N/A 05/20/2019   Procedure: GIVENS CAPSULE STUDY;  Surgeon: [name]., MD;  Location: [place];  Service: Gastroenterology;  Laterality: N/A;   HOT HEMOSTASIS N/A 05/23/2019   Procedure: HOT HEMOSTASIS (ARGON PLASMA COAGULATION/BICAP);  Surgeon: [name], MD;  Location: [place];  Service: Endoscopy;  Laterality: N/A;   MANDIBLE SURGERY     MAXIMUM ACCESS (MAS)POSTERIOR LUMBAR INTERBODY FUSION (PLIF) 2 LEVEL N/A 08/08/2015   Procedure: Lumbar Four-Five, Lumbar Five-Sacral One Maximum access posterior lumbar interbody fusion;  Surgeon: [name], MD;  Location: [place] NEURO ORS;  Service: Neurosurgery;  Laterality: N/A;  LUMBAR FOUR-FIVE ,LUMBAR FIVE -SACRAL Maximum access posterior lumbar interbody fusion   POLYPECTOMY  05/20/2019   Procedure: POLYPECTOMY;  Surgeon: [name], Telford Nab., MD;  Location: [place];  Service: Gastroenterology;;   RENAL ARTERY STENT  2010   REPLACEMENT TOTAL KNEE  2006   RIGHT  SPINAL CORD STIMULATOR INSERTION N/A 06/18/2017   Procedure: LUMBAR SPINAL CORD STIMULATOR INSERTION;  Surgeon: [name], MD;  Location: [place];  Service: Neurosurgery;  Laterality: N/A;  LUMBAR SPINAL CORD STIMULATOR INSERTION   TOTAL HIP ARTHROPLASTY     right   Family History  Problem Relation Age of Onset   Lung cancer Father 93       deceased    Cancer Father 39       LUNG   Stroke Mother 37       DECEASED    Hyperlipidemia Mother    Heart attack Brother 19       deceased   Diabetes Brother    Liver cancer Sister 78       deceased    Cancer Sister    Social History   Socioeconomic History   Marital status: Married    Spouse name: Not on file   Number of children: 2   Years of education: Not on file   Highest education level: Not on file  Occupational History   Occupation: RETIRED     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, [place]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  Tobacco Use   Smoking status: Former    Types: Cigarettes    Quit date: 04/13/1974    Years since quitting: 47.0   Smokeless tobacco: Former    Types: Chew    Quit date: 01/26/2002   Tobacco comments:    He has smoked about 27-pack-year hx   Vaping Use   Vaping Use: Never used  Substance and Sexual Activity   Alcohol use: Not Currently    Alcohol/week: 2.0 standard drinks    Types: 2 Cans of beer per week    Comment: He used to drink more heavily , but rarely drinks at the current time   Drug use: No   Sexual activity: Not on file  Other Topics Concern   Not on file  Social History Narrative   LIVES IN [place].   HE HAS 2 GROWN CHILDREN   HE IS RETIRED PRISON FIRM SUPERINTENDENT.   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   HE GOES TO THE [employer] 3 X WEEKLY.   Social Determinants of Health   Financial Resource Strain: Not on file  Food Insecurity: Not on file  Transportation Needs: Not on file  Physical Activity: Not on file  Stress: Not on file  Social Connections: Not on file    Tobacco Counseling Counseling given: Not Answered Tobacco comments: He has smoked about 27-pack-year hx    Clinical Intake:                 Diabetes:  Is the patient diabetic?  Yes  If diabetic, was a CBG obtained today?  No , visit completed over the phone. Did the patient bring in their glucometer from home?  No  How often do you monitor your CBG's? ***.   Financial Strains and Diabetes Management:  Are you having any financial strains with the device, your supplies or your medication? {YES/NO:21197}.  Does the patient want to be seen by Chronic Care Management for management of their diabetes?  {YES/NO:21197} Would the patient like to be referred to a Nutritionist or for Diabetic Management?  {YES/NO:21197}  Diabetic Exams:  Diabetic Eye Exam: Completed ***. Overdue for diabetic eye exam. Pt has been  advised about the importance in completing this exam. A  referral has been placed today. Message sent to referral coordinator for scheduling purposes. Advised pt to expect a call from our office re: appt.  Diabetic Foot Exam: Completed ***. Pt has been advised about the importance in completing this exam. Pt is scheduled for diabetic foot exam on ***.           Activities of Daily Living In your present state of health, do you have any difficulty performing the following activities: 09/24/2020  Hearing? Y  Vision? N  Difficulty concentrating or making decisions? N  Walking or climbing stairs? Y  Dressing or bathing? N  Doing errands, shopping? N  Some recent data might be hidden    Patient Care Team: [name], NP as PCP - General (Internal Medicine) [name], MD as PCP - Cardiology (Cardiology) Earlie Server, MD as Consulting Physician (Hematology and Oncology) Jen Mow, [place] as Physician Assistant (Nephrology) Charlton Haws, [facility] as Pharmacist (Pharmacist)  Indicate any recent Medical Services you may have received from other than [facility] providers in the past year (date may be approximate).     Assessment:   This is a routine wellness examination for [place].  Hearing/Vision screen No results found.  Dietary issues and exercise activities discussed:     Goals Addressed   None    Depression Screen PHQ 2/9 Scores 11/07/2020 10/11/2019 11/19/2015  PHQ - 2 Score 0 0 0  PHQ- 9 Score - 0 -    Fall Risk Fall Risk  03/11/2020 10/11/2019  Falls in the past year? 1 1  Comment - tripped over rock  Number falls in past yr: 1 0  Injury with Fall? 0 0  Risk for fall due to : Impaired balance/gait Medication side effect  Follow up - Falls evaluation completed;Falls prevention discussed    FALL RISK PREVENTION PERTAINING TO THE HOME:  Any stairs in or around the home? {YES/NO:21197} If so, are there any without handrails? {YES/NO:21197} Home free of  loose throw rugs in walkways, pet beds, electrical cords, etc? {YES/NO:21197} Adequate lighting in your home to reduce risk of falls? {YES/NO:21197}  ASSISTIVE DEVICES UTILIZED TO PREVENT FALLS:  Life alert? {YES/NO:21197} Use of a cane, walker or w/c? {YES/NO:21197} Grab bars in the bathroom? {YES/NO:21197} Shower chair or bench in shower? {YES/NO:21197} Elevated toilet seat or a handicapped toilet? {YES/NO:21197}  TIMED UP AND GO:  Was the test performed? No .    Cognitive Function: MMSE - Mini Mental State Exam 10/11/2019  Not completed: Unable to complete        Immunizations Immunization History  Administered Date(s) Administered   Influenza Split 01/29/2012   Influenza-Unspecified 02/16/2017, 02/17/2018, 02/29/2020   PFIZER(Purple Top)SARS-COV-2 Vaccination 05/27/2019, 06/18/2019, 02/29/2020   Pneumococcal Polysaccharide-23 01/29/2012, 06/04/2014, 08/10/2015   Tdap 01/20/2018   Zoster, Live 02/18/2016    TDAP status: Up to date  {Flu Vaccine status:2101806}  Pneumococcal vaccine status: Up to date  {Covid-19 vaccine status:2101808}  Qualifies for Shingles Vaccine? Yes   Zostavax completed Yes   {Shingrix Completed?:2101804}  Screening Tests Health Maintenance  Topic Date Due   OPHTHALMOLOGY EXAM  Never done   URINE MICROALBUMIN  Never done   Zoster Vaccines- Shingrix (1 of 2) Never done   Pneumonia Vaccine 66+ Years old (3 - PCV) 08/09/2016   COVID-19 Vaccine (4 - Booster for Pfizer series) 04/25/2020   INFLUENZA VACCINE  11/11/2020   FOOT EXAM  03/11/2021   HEMOGLOBIN A1C  05/10/2021   TETANUS/TDAP  01/21/2028   HPV VACCINES  Aged Out    Health Maintenance  Health Maintenance Due  Topic Date Due   OPHTHALMOLOGY EXAM  Never done   URINE MICROALBUMIN  Never done   Zoster Vaccines- Shingrix (1 of 2) Never done   Pneumonia Vaccine 71+ Years old (3 - PCV) 08/09/2016   COVID-19 Vaccine (4 - Booster for Pfizer series) 04/25/2020   INFLUENZA VACCINE   11/11/2020   FOOT EXAM  03/11/2021    Colorectal cancer screening: No longer required.   Lung Cancer Screening: (Low Dose CT Chest recommended if Age 65-80 years, 30 pack-year currently smoking OR have quit w/in 15years.) does not qualify.     Additional Screening:  Hepatitis C Screening: does not qualify  Vision Screening: Recommended annual ophthalmology exams for early detection of glaucoma and other disorders of the eye. Is the patient up to date with their annual eye exam?  {YES/NO:21197} Who is the provider or what is the name of the office in which the patient attends annual eye exams? *** If pt is not established with a provider, would they like to be referred to a provider to establish care? {YES/NO:21197}.   Dental Screening: Recommended annual dental exams for proper oral hygiene  Community Resource Referral / Chronic Care Management: CRR required this visit?  {YES/NO:21197}  CCM required this visit?  {YES/NO:21197}     Plan:     I have personally reviewed and noted the following in the patients chart:   Medical and social history Use of alcohol, tobacco or illicit drugs  Current medications and supplements including opioid prescriptions. {Opioid [phone]} Functional ability and status Nutritional status Physical activity Advanced directives List of other physicians Hospitalizations, surgeries, and ER visits in previous 12 months Vitals Screenings to include cognitive, depression, and falls Referrals and appointments  In addition, I have reviewed and discussed with patient certain preventive protocols, quality metrics, and best practice recommendations. A written personalized care plan for preventive services as well as general preventive health recommendations were provided to patient.   Due to this being a telephonic visit, the after visit summary with patients personalized plan was offered to patient via mail or my-chart. ***Patient  declined at this time./ Patient would like to access on my-chart/ per request, patient was mailed a copy of AVS./ Patient preferred to pick up at office at next visit.   Loma Messing, LPN   25/08/3974   Nurse Health Advisor  Nurse Notes: none

## 2021-04-08 ENCOUNTER — Ambulatory Visit: Payer: Medicare Other

## 2021-04-22 ENCOUNTER — Ambulatory Visit (INDEPENDENT_AMBULATORY_CARE_PROVIDER_SITE_OTHER): Payer: Medicare Other

## 2021-04-22 VITALS — Ht 74.0 in | Wt 211.0 lb

## 2021-04-22 DIAGNOSIS — Z0000 Encounter for general adult medical examination without abnormal findings: Secondary | ICD-10-CM | POA: Diagnosis not present

## 2021-04-22 NOTE — Patient Instructions (Signed)
Leonard Berg , Thank you for taking time to complete your Medicare Wellness Visit. I appreciate your ongoing commitment to your health goals. Please review the following plan we discussed and let me know if I can assist you in the future.   Screening recommendations/referrals: Colonoscopy: no longer required  Recommended yearly ophthalmology/optometry visit for glaucoma screening and checkup Recommended yearly dental visit for hygiene and checkup  Vaccinations: Influenza vaccine: up to date Pneumococcal vaccine: up to date Tdap vaccine: up to date, completed 01/20/18, due 01/21/28 Shingles vaccine: declined today, discuss with your local pharmacy if you change your mind   Covid-19: newest booster available at your local pharmacy  Advanced directives: Please bring a copy of Living Will and/or [place] for your chart.   Conditions/risks identified: see problem list   Next appointment: Follow up in one year for your annual wellness visit. 04/24/22 @ 9:00am, this will be a telephone visit  Preventive Care 39 Years and Older, Male Preventive care refers to lifestyle choices and visits with your health care provider that can promote health and wellness. What does preventive care include? A yearly physical exam. This is also called an annual well check. Dental exams once or twice a year. Routine eye exams. Ask your health care provider how often you should have your eyes checked. Personal lifestyle choices, including: Daily care of your teeth and gums. Regular physical activity. Eating a healthy diet. Avoiding tobacco and drug use. Limiting alcohol use. Practicing safe sex. Taking low doses of aspirin every day. Taking vitamin and mineral supplements as recommended by your health care provider. What happens during an annual well check? The services and screenings done by your health care provider during your annual well check will depend on your age, overall health,  lifestyle risk factors, and family history of disease. Counseling  Your health care provider may ask you questions about your: Alcohol use. Tobacco use. Drug use. Emotional well-being. Home and relationship well-being. Sexual activity. Eating habits. History of falls. Memory and ability to understand (cognition). Work and work Statistician. Screening  You may have the following tests or measurements: Height, weight, and BMI. Blood pressure. Lipid and cholesterol levels. These may be checked every 5 years, or more frequently if you are over 34 years old. Skin check. Lung cancer screening. You may have this screening every year starting at age 91 if you have a 30-pack-year history of smoking and currently smoke or have quit within the past 15 years. Fecal occult blood test (FOBT) of the stool. You may have this test every year starting at age 63. Flexible sigmoidoscopy or colonoscopy. You may have a sigmoidoscopy every 5 years or a colonoscopy every 10 years starting at age 81. Prostate cancer screening. Recommendations will vary depending on your family history and other risks. Hepatitis C blood test. Hepatitis B blood test. Sexually transmitted disease (STD) testing. Diabetes screening. This is done by checking your blood sugar (glucose) after you have not eaten for a while (fasting). You may have this done every 1-3 years. Abdominal aortic aneurysm (AAA) screening. You may need this if you are a current or former smoker. Osteoporosis. You may be screened starting at age 67 if you are at high risk. Talk with your health care provider about your test results, treatment options, and if necessary, the need for more tests. Vaccines  Your health care provider may recommend certain vaccines, such as: Influenza vaccine. This is recommended every year. Tetanus, diphtheria, and acellular pertussis (Tdap,  Td) vaccine. You may need a Td booster every 10 years. Zoster vaccine. You may need this  after age 61. Pneumococcal 13-valent conjugate (PCV13) vaccine. One dose is recommended after age 97. Pneumococcal polysaccharide (PPSV23) vaccine. One dose is recommended after age 35. Talk to your health care provider about which screenings and vaccines you need and how often you need them. This information is not intended to replace advice given to you by your health care provider. Make sure you discuss any questions you have with your health care provider. Document Released: 04/26/2015 Document Revised: 12/18/2015 Document Reviewed: 01/29/2015 Elsevier Interactive Patient Education  2017 [place] Prevention in the Home Falls can cause injuries. They can happen to people of all ages. There are many things you can do to make your home safe and to help prevent falls. What can I do on the outside of my home? Regularly fix the edges of walkways and driveways and fix any cracks. Remove anything that might make you trip as you walk through a door, such as a raised step or threshold. Trim any bushes or trees on the path to your home. Use bright outdoor lighting. Clear any walking paths of anything that might make someone trip, such as rocks or tools. Regularly check to see if handrails are loose or broken. Make sure that both sides of any steps have handrails. Any raised decks and porches should have guardrails on the edges. Have any leaves, snow, or ice cleared regularly. Use sand or salt on walking paths during winter. Clean up any spills in your garage right away. This includes oil or grease spills. What can I do in the bathroom? Use night lights. Install grab bars by the toilet and in the tub and shower. Do not use towel bars as grab bars. Use non-skid mats or decals in the tub or shower. If you need to sit down in the shower, use a plastic, non-slip stool. Keep the floor dry. Clean up any water that spills on the floor as soon as it happens. Remove soap buildup in the tub or  shower regularly. Attach bath mats securely with double-sided non-slip rug tape. Do not have throw rugs and other things on the floor that can make you trip. What can I do in the bedroom? Use night lights. Make sure that you have a light by your bed that is easy to reach. Do not use any sheets or blankets that are too big for your bed. They should not hang down onto the floor. Have a firm chair that has side arms. You can use this for support while you get dressed. Do not have throw rugs and other things on the floor that can make you trip. What can I do in the kitchen? Clean up any spills right away. Avoid walking on wet floors. Keep items that you use a lot in easy-to-reach places. If you need to reach something above you, use a strong step stool that has a grab bar. Keep electrical cords out of the way. Do not use floor polish or wax that makes floors slippery. If you must use wax, use non-skid floor wax. Do not have throw rugs and other things on the floor that can make you trip. What can I do with my stairs? Do not leave any items on the stairs. Make sure that there are handrails on both sides of the stairs and use them. Fix handrails that are broken or loose. Make sure that handrails are as  long as the stairways. Check any carpeting to make sure that it is firmly attached to the stairs. Fix any carpet that is loose or worn. Avoid having throw rugs at the top or bottom of the stairs. If you do have throw rugs, attach them to the floor with carpet tape. Make sure that you have a light switch at the top of the stairs and the bottom of the stairs. If you do not have them, ask someone to add them for you. What else can I do to help prevent falls? Wear shoes that: Do not have high heels. Have rubber bottoms. Are comfortable and fit you well. Are closed at the toe. Do not wear sandals. If you use a stepladder: Make sure that it is fully opened. Do not climb a closed stepladder. Make  sure that both sides of the stepladder are locked into place. Ask someone to hold it for you, if possible. Clearly mark and make sure that you can see: Any grab bars or handrails. First and last steps. Where the edge of each step is. Use tools that help you move around (mobility aids) if they are needed. These include: Canes. Walkers. Scooters. Crutches. Turn on the lights when you go into a dark area. Replace any light bulbs as soon as they burn out. Set up your furniture so you have a clear path. Avoid moving your furniture around. If any of your floors are uneven, fix them. If there are any pets around you, be aware of where they are. Review your medicines with your doctor. Some medicines can make you feel dizzy. This can increase your chance of falling. Ask your doctor what other things that you can do to help prevent falls. This information is not intended to replace advice given to you by your health care provider. Make sure you discuss any questions you have with your health care provider. Document Released: 01/24/2009 Document Revised: 09/05/2015 Document Reviewed: 05/04/2014 Elsevier Interactive Patient Education  2017 [employer].

## 2021-04-22 NOTE — Progress Notes (Signed)
Subjective:   Leonard Matters Yankey Sr. is a 88 y.o. male who presents for Medicare Annual/Subsequent preventive examination.  I connected with Leonard Berg today by telephone and verified that I am speaking with the correct person using two identifiers. Location patient: home Location provider: work Persons participating in the virtual visit: patient, son and Leonard Berg.    I discussed the limitations, risks, security and privacy concerns of performing an evaluation and management service by telephone and the availability of in person appointments. I also discussed with the patient that there may be a patient responsible charge related to this service. The patient expressed understanding and verbally consented to this telephonic visit.    Interactive audio and video telecommunications were attempted between this provider and patient, however failed, due to patient having technical difficulties OR patient did not have access to video capability.  We continued and completed visit with audio only.  Some vital signs may be absent or patient reported.   Time Spent with patient on telephone encounter: 25 minutes  Review of Systems     Cardiac Risk Factors include: advanced age (>77men, >41 women);diabetes mellitus;hypertension;dyslipidemia     Objective:    Today's Vitals   04/22/21 0946  Weight: 211 lb (95.7 kg)  Height: 6\' 2"  (1.88 m)   Body mass index is 27.09 kg/m.  Advanced Directives 04/22/2021 09/24/2020 10/11/2019 09/29/2019 06/28/2019 05/19/2019 05/18/2019  Does Patient Have a Medical Advance Directive? Yes Yes Yes Yes Yes No No  Type of Academic librarian Living will [place];Living will Living will;Healthcare Power of [place];Living will - -  Does patient want to make changes to medical advance directive? Yes (MAU/Ambulatory/Procedural Areas - Information given) No - Patient declined - - - - -   Copy of Healthcare Power of Attorney in Chart? - - No - copy requested - - - -  Would patient like information on creating a medical advance directive? - - - - - No - Patient declined No - Patient declined  Pre-existing out of facility DNR order (yellow form or pink MOST form) - - - - - - -    Current Medications (verified) Outpatient Encounter Medications as of 04/22/2021  Medication Sig   amiodarone (PACERONE) 200 MG tablet Take 1 tablet (200 mg total) by mouth daily.   cetirizine (ZYRTEC) 10 MG tablet TAKE 1 TABLET BY MOUTH ONCE DAILY as needed FOR ALLERGIES.   ferrous sulfate 324 MG TBEC Take 324 mg by mouth at bedtime.   furosemide (LASIX) 40 MG tablet Take 40 mg by mouth in the morning.   levothyroxine (SYNTHROID) 100 MCG tablet TAKE 1 TABLET BY MOUTH ON EMPTY STOMACH WITH WATER ONLY ON SUNDAY THROUGH FRIDAY. TAKE 1 & 1/2 TABS SATURDAY ONLY.NO FOOD OR MEDS FOR 30 MINUTES AFTER.   LINZESS 145 MCG CAPS capsule Take 1 capsule (145 mcg total) by mouth every other day. For constipation   Rivaroxaban (XARELTO) 15 MG TABS tablet TAKE 1 TABLET BY MOUTH ONCE A DAY WITH SUPPER   rosuvastatin (CRESTOR) 10 MG tablet TAKE ONE TABLET BY MOUTH IN THE EVENING FOR CHOLESTEROL.   tamsulosin (FLOMAX) 0.4 MG CAPS capsule TAKE 1 CAPSULE BY MOUTH ONCE DAILY. FOR URINE FLOW.   vitamin B-12 (CYANOCOBALAMIN) 1000 MCG tablet TAKE 1 TABLET BY MOUTH ONCE DAILY   No facility-administered encounter medications on file as of 04/22/2021.    Allergies (verified) Patient has no known allergies.   History: Past  Medical History:  Diagnosis Date   Anemia    Anxiety    Arrhythmia    PAROXYSMAL ATRIAL FIBRILLATION   Arthritis    OSTEOARTHRITIS   Cataracts, bilateral    Chronic back pain    Coronary artery disease 8/11   CABG...LM EMERGENT WITH IABP sees Dr. Legrand Como cooper   DJD (degenerative joint disease)    DVT (deep venous thrombosis) (HCC)    Dyspnea    GERD (gastroesophageal reflux disease)    History  of BPH    Hyperlipidemia    Hypertension    Hypothyroidism    Iron deficiency anemia due to chronic blood loss 06/29/2019   Lumbar post-laminectomy syndrome    Myocardial infarction [facility])    Pulmonary embolism ([place]) 2009   hx of   RBBB (right bundle branch block)    Renal artery stenosis (HCC)    Thrombocytopenia ([place])    Thyroid disease    HYPOTHYROIDISM   Type 2 diabetes mellitus without complication, without long-term current use of insulin ([place]) 07/28/2019   Unstable angina ([place])    NONE IN LONG TIME   Past Surgical History:  Procedure Laterality Date   Blood clot  Feb.20,  2016   Pulmonary Embolism   CARDIOVERSION N/A 03/15/2019   Procedure: CARDIOVERSION;  Surgeon: Pixie Casino, MD;  Location: [place];  Service: Cardiovascular;  Laterality: N/A;   CAROTID ENDARTERECTOMY Right Oct. 17, 2013   CE   CAROTID ENDARTERECTOMY Left Dec. 3, 2016   CE   CATARACT EXTRACTION  05/2014,06/2014   COLONOSCOPY WITH PROPOFOL N/A 05/20/2019   Procedure: COLONOSCOPY WITH PROPOFOL;  Surgeon: [name]., MD;  Location: [facility] ENDOSCOPY;  Service: Gastroenterology;  Laterality: N/A;   CORONARY ARTERY BYPASS GRAFT  11-21-09   EMERGENT X 3 GRAFTING. ..[name], MD, cc: [name]   ENDARTERECTOMY  01/28/2012   Procedure: ENDARTERECTOMY CAROTID;  Surgeon: [name], MD;  Location: [place];  Service: Vascular;  Laterality: Right;  with Primary Closure of Artery   ENDARTERECTOMY  03/15/2012   Procedure: ENDARTERECTOMY CAROTID;  Surgeon: [name], MD;  Location: [place];  Service: Vascular;  Laterality: Left;   ENTEROSCOPY N/A 05/23/2019   Procedure: ENTEROSCOPY;  Surgeon: [name], MD;  Location: [place];  Service: Endoscopy;  Laterality: N/A;   ESOPHAGOGASTRODUODENOSCOPY N/A 05/20/2019   Procedure: ESOPHAGOGASTRODUODENOSCOPY (EGD);  Surgeon: [name]., MD;  Location: [place];  Service: Gastroenterology;  Laterality: N/A;    ESOPHAGOGASTRODUODENOSCOPY (EGD) WITH PROPOFOL N/A 05/19/2019   Procedure: ESOPHAGOGASTRODUODENOSCOPY (EGD) WITH PROPOFOL;  Surgeon: [name], MD;  Location: [place];  Service: Endoscopy;  Laterality: N/A;   FOOT SURGERY     GIVENS CAPSULE STUDY N/A 05/20/2019   Procedure: GIVENS CAPSULE STUDY;  Surgeon: [name]., MD;  Location: [place];  Service: Gastroenterology;  Laterality: N/A;   HOT HEMOSTASIS N/A 05/23/2019   Procedure: HOT HEMOSTASIS (ARGON PLASMA COAGULATION/BICAP);  Surgeon: [name], MD;  Location: [place];  Service: Endoscopy;  Laterality: N/A;   MANDIBLE SURGERY     MAXIMUM ACCESS (MAS)POSTERIOR LUMBAR INTERBODY FUSION (PLIF) 2 LEVEL N/A 08/08/2015   Procedure: Lumbar Four-Five, Lumbar Five-Sacral One Maximum access posterior lumbar interbody fusion;  Surgeon: [name], MD;  Location: [place] NEURO ORS;  Service: Neurosurgery;  Laterality: N/A;  LUMBAR FOUR-FIVE ,LUMBAR FIVE -SACRAL Maximum access posterior lumbar interbody fusion   POLYPECTOMY  05/20/2019   Procedure: POLYPECTOMY;  Surgeon: [name], Telford Nab., MD;  Location: [place];  Service: Gastroenterology;;  RENAL ARTERY STENT  2010   REPLACEMENT TOTAL KNEE  2006   RIGHT   SPINAL CORD STIMULATOR INSERTION N/A 06/18/2017   Procedure: LUMBAR SPINAL CORD STIMULATOR INSERTION;  Surgeon: [name], MD;  Location: [place];  Service: Neurosurgery;  Laterality: N/A;  LUMBAR SPINAL CORD STIMULATOR INSERTION   TOTAL HIP ARTHROPLASTY     right   Family History  Problem Relation Age of Onset   Lung cancer Father 41       deceased    Cancer Father 55       LUNG   Stroke Mother 86       DECEASED    Hyperlipidemia Mother    Heart attack Brother 83       deceased   Diabetes Brother    Liver cancer Sister 55       deceased    Cancer Sister    Social History   Socioeconomic History   Marital status: Widowed    Spouse name: Not on file   Number of children: 2   Years of education: Not  on file   Highest education level: Not on file  Occupational History   Occupation: RETIRED     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, [place]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  Tobacco Use   Smoking status: Former    Types: Cigarettes    Quit date: 04/13/1974    Years since quitting: 47.0   Smokeless tobacco: Former    Types: Chew    Quit date: 01/26/2002   Tobacco comments:    He has smoked about 27-pack-year hx   Vaping Use   Vaping Use: Never used  Substance and Sexual Activity   Alcohol use: Not Currently    Alcohol/week: 2.0 standard drinks    Types: 2 Cans of beer per week    Comment: He used to drink more heavily , but rarely drinks at the current time   Drug use: No   Sexual activity: Not on file  Other Topics Concern   Not on file  Social History Narrative   [place].   HE HAS 2 GROWN CHILDREN   HE IS RETIRED PRISON FIRM SUPERINTENDENT.   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   HE GOES TO THE [employer] 3 X WEEKLY.   Social Determinants of Health   Financial Resource Strain: Low Risk    Difficulty of Paying Living Expenses: Not hard at all  Food Insecurity: No Food Insecurity   Worried About Charity fundraiser in the Last Year: Never true   [place] in the Last Year: Never true  Transportation Needs: No Transportation Needs   Lack of Transportation (Medical): No   Lack of Transportation (Non-Medical): No  Physical Activity: Insufficiently Active   Days of Exercise per Week: 7 days   Minutes of Exercise per Session: 20 min  Stress: No Stress Concern Present   Feeling of Stress : Only a little  Social Connections: Socially Isolated   Frequency of Communication with Friends and Family: Never   Frequency of Social Gatherings with Friends and Family: More than three times a week   Attends Religious Services: Never   Leonard Berg or Organizations: No   Attends Archivist Meetings:  Never   Marital Status: Widowed    Tobacco Counseling Counseling given: Not Answered Tobacco comments: He has smoked about 27-pack-year hx  Clinical Intake:  Pre-visit preparation completed: Yes  Pain : No/denies pain     BMI - recorded: 27.09 Nutritional Status: BMI 25 -29 Overweight Nutritional Risks: None Diabetes: Yes CBG done?: No Did pt. bring in CBG monitor from home?: No  How often do you need to have someone help you when you read instructions, pamphlets, or other written materials from your doctor or pharmacy?: 1 - Never  Diabetes:  Is the patient diabetic?  Yes  If diabetic, was a CBG obtained today?  No  Did the patient bring in their glucometer from home?  No  How often do you monitor your CBG's? Not currently monitoring.   Financial Strains and Diabetes Management:  Are you having any financial strains with the device, your supplies or your medication? No .  Does the patient want to be seen by Chronic Care Management for management of their diabetes?  No  Would the patient like to be referred to a Nutritionist or for Diabetic Management?  No   Diabetic Exams:  Diabetic Eye Exam: Overdue for diabetic eye exam. Pt has been advised about the importance in completing this exam.   Diabetic Foot Exam:  due, last completed  03/11/20   Interpreter Needed?: No  Information entered by :: Orrin Brigham LPN   Activities of Daily Living In your present state of health, do you have any difficulty performing the following activities: 04/22/2021 09/24/2020  Hearing? Tempie Donning  Vision? N N  Difficulty concentrating or making decisions? N N  Walking or climbing stairs? Y Y  Comment uses walker -  Dressing or bathing? N N  Doing errands, shopping? Y N  Comment son assist -  Conservation officer, nature and eating ? N -  Using the Toilet? N -  In the past six months, have you accidently leaked urine? N -  Do you have problems with loss of bowel control? N -  Managing your  Medications? Y -  Comment son assist -  Managing your Finances? N -  Housekeeping or managing your Housekeeping? N -  Some recent data might be hidden    Patient Care Team: [name], NP as PCP - General (Internal Medicine) [name], MD as PCP - Cardiology (Cardiology) Earlie Server, MD as Consulting Physician (Hematology and Oncology) Jen Mow, [place] as Physician Assistant (Nephrology) Charlton Haws, [facility] as Pharmacist (Pharmacist)  Indicate any recent Medical Services you may have received from other than [facility] providers in the past year (date may be approximate).     Assessment:   This is a routine wellness examination for [place].  Hearing/Vision screen Hearing Screening - Comments:: Has issues with hearing, patient has hearing aids Vision Screening - Comments:: Last exam over a year ago, plans to make an appointment  Dietary issues and exercise activities discussed: Current Exercise Habits: Home exercise routine, Type of exercise: walking, Time (Minutes): 20 (10 minutes 1-2 times per day), Frequency (Times/Week): 7, Weekly Exercise (Minutes/Week): 140   Goals Addressed             This Visit's Progress    Patient Stated       Would like to maintain current routine       Depression Screen PHQ 2/9 Scores 04/22/2021 11/07/2020 10/11/2019 11/19/2015  PHQ - 2 Score 1 0 0 0  PHQ- 9 Score - - 0 -    Fall Risk Fall Risk  04/22/2021 03/11/2020 10/11/2019  Falls in the past year? 1 1 1   Comment - -  tripped over rock  Number falls in past yr: 0 1 0  Injury with Fall? 1 0 0  Comment broke right femur - -  Risk for fall due to : Impaired balance/gait Impaired balance/gait Medication side effect  Follow up Falls prevention discussed - Falls evaluation completed;Falls prevention discussed    FALL RISK PREVENTION PERTAINING TO THE HOME:  Any stairs in or around the home? Yes  If so, are there any without handrails? No  Home free of loose throw rugs in  walkways, pet beds, electrical cords, etc? Yes  Adequate lighting in your home to reduce risk of falls? Yes   ASSISTIVE DEVICES UTILIZED TO PREVENT FALLS:  Life alert? No  Use of a cane, walker or w/c? Yes , walker  Grab bars in the bathroom? Yes  Shower chair or bench in shower? Yes  Elevated toilet seat or a handicapped toilet? No   TIMED UP AND GO:  Was the test performed? No .    Cognitive Function: Normal cognitive status assessed by  this Nurse Health Advisor. No abnormalities found.   MMSE - Mini Mental State Exam 10/11/2019  Not completed: Unable to complete        Immunizations Immunization History  Administered Date(s) Administered   Influenza Split 01/29/2012   Influenza, High Dose Seasonal PF 02/11/2021   Influenza-Unspecified 02/16/2017, 02/17/2018, 02/29/2020   PFIZER(Purple Top)SARS-COV-2 Vaccination 05/27/2019, 06/18/2019, 02/29/2020   Pneumococcal Polysaccharide-23 01/29/2012, 06/04/2014, 08/10/2015   Tdap 01/20/2018   Zoster, Live 02/18/2016    TDAP status: Up to date  Flu Vaccine status: Up to date  Pneumococcal vaccine status: Up to date  Covid-19 vaccine status: Declined, Education has been provided regarding the importance of this vaccine but patient still declined. Advised may receive this vaccine at local pharmacy or Health Dept.or vaccine clinic. Aware to provide a copy of the vaccination record if obtained from local pharmacy or Health Dept. Verbalized acceptance and understanding.  Qualifies for Shingles Vaccine? Yes   Zostavax completed Yes   Shingrix Completed?: No.    Education has been provided regarding the importance of this vaccine. Patient has been advised to call insurance company to determine out of pocket expense if they have not yet received this vaccine. Advised may also receive vaccine at local pharmacy or Health Dept. Verbalized acceptance and understanding.  Screening Tests Health Maintenance  Topic Date Due   OPHTHALMOLOGY  EXAM  Never done   URINE MICROALBUMIN  Never done   Zoster Vaccines- Shingrix (1 of 2) Never done   Pneumonia Vaccine 83+ Years old (3 - PCV) 08/09/2016   COVID-19 Vaccine (4 - Booster for Pfizer series) 04/25/2020   FOOT EXAM  03/11/2021   HEMOGLOBIN A1C  05/10/2021   TETANUS/TDAP  01/21/2028   INFLUENZA VACCINE  Completed   HPV VACCINES  Aged Out    Health Maintenance  Health Maintenance Due  Topic Date Due   OPHTHALMOLOGY EXAM  Never done   URINE MICROALBUMIN  Never done   Zoster Vaccines- Shingrix (1 of 2) Never done   Pneumonia Vaccine 68+ Years old (3 - PCV) 08/09/2016   COVID-19 Vaccine (4 - Booster for Pfizer series) 04/25/2020   FOOT EXAM  03/11/2021    Colorectal cancer screening: No longer required.   Lung Cancer Screening: (Low Dose CT Chest recommended if Age 3-80 years, 30 pack-year currently smoking OR have quit w/in 15years.) does not qualify.     Additional Screening:  Hepatitis C Screening: does not qualify  Vision  Screening: Recommended annual ophthalmology exams for early detection of glaucoma and other disorders of the eye. Is the patient up to date with their annual eye exam?  No  Who is the provider or what is the name of the office in which the patient attends annual eye exams? Provider information unavailable.   Dental Screening: Recommended annual dental exams for proper oral hygiene  Community Resource Referral / Chronic Care Management: CRR required this visit?  No   CCM required this visit?  No      Plan:     I have personally reviewed and noted the following in the patients chart:   Medical and social history Use of alcohol, tobacco or illicit drugs  Current medications and supplements including opioid prescriptions. Patient is not currently taking opioid prescriptions. Functional ability and status Nutritional status Physical activity Advanced directives List of other physicians Hospitalizations, surgeries, and ER visits in  previous 12 months Vitals Screenings to include cognitive, depression, and falls Referrals and appointments  In addition, I have reviewed and discussed with patient certain preventive protocols, quality metrics, and best practice recommendations. A written personalized care plan for preventive services as well as general preventive health recommendations were provided to patient.   Due to this being a telephonic visit, the after visit summary with patients personalized plan was offered to patient via mail or my-chart. Patient would like to access on my-chart     Loma Messing, LPN  [phone]   Nurse Health Advisor  Nurse Notes: none

## 2021-04-24 ENCOUNTER — Ambulatory Visit (INDEPENDENT_AMBULATORY_CARE_PROVIDER_SITE_OTHER): Payer: Medicare Other | Admitting: Primary Care

## 2021-04-24 ENCOUNTER — Other Ambulatory Visit: Payer: Self-pay

## 2021-04-24 ENCOUNTER — Encounter: Payer: Self-pay | Admitting: Primary Care

## 2021-04-24 VITALS — BP 134/78 | HR 79 | Temp 98.3°F | Ht 74.0 in | Wt 226.0 lb

## 2021-04-24 DIAGNOSIS — B351 Tinea unguium: Secondary | ICD-10-CM | POA: Diagnosis not present

## 2021-04-24 DIAGNOSIS — I10 Essential (primary) hypertension: Secondary | ICD-10-CM | POA: Diagnosis not present

## 2021-04-24 NOTE — Assessment & Plan Note (Signed)
Obvious on exam.  Referral placed to podiatry.

## 2021-04-24 NOTE — Patient Instructions (Addendum)
You will be contacted regarding your referral to Podiatry.  Please let us know if you have not been contacted within two weeks.   You are due to see your cardiologist in February 2023.  It was a pleasure to see you today!

## 2021-04-24 NOTE — Progress Notes (Signed)
Subjective:    Patient ID: Leonard Brace Sr., male    DOB: 10/25/1929, 88 y.o.   MRN: [id number]  HPI  Leonard Gales [facility] Sr. is a very pleasant 88 y.o. male with a history of hypertension, unstable angina, paroxysmal atrial fibrillation, renal artery atherosclerosis, CHF, carotid artery stenosis, PE, GIB, type 2 diabetes who presents today to discuss elevated blood pressure. His son is with him today who helps to provide information for HPI.  1) Hypertension: He was visited by his health insurance nurse on 04/08/21, BP was "high". His son has been tracking his BP over the last year and was seeing readings in the 130's/70's-80's. Over the last month readings have been ranging 140's-160's/70's-80's.   His BP will fluctuate throughout the day, usually comes down to below 222 systolic.   Managed on furosemide 40 mg in the morning for CHF. He is not taking Flomax.  He denies headaches, dizziness, chest pain, changes in his diet. He is compliant to his medications.   Wt Readings from Last 3 Encounters:  04/24/21 226 lb (102.5 kg)  04/22/21 211 lb (95.7 kg)  11/07/20 211 lb (95.7 kg)     BP Readings from Last 3 Encounters:  04/24/21 134/78  11/07/20 120/70  09/27/20 134/72   2) Toenail Fungus: Chronic for at least 6 months per patient, son thinks it has been much longer. Also with elongated, thickened great toenails for which his son has to cut.   He is requesting a podiatry referral.   Review of Systems  Eyes:  Negative for visual disturbance.  Respiratory:  Negative for shortness of breath.   Cardiovascular:  Negative for chest pain and leg swelling.  Skin:        Toe nail fungus Thick toenails  Neurological:  Negative for dizziness and headaches.        Past Medical History:  Diagnosis Date   Anemia    Anxiety    Arrhythmia    PAROXYSMAL ATRIAL FIBRILLATION   Arthritis    OSTEOARTHRITIS   Cataracts, bilateral    Chronic back pain    Coronary artery  disease 8/11   CABG...LM EMERGENT WITH IABP sees Dr. Legrand Como cooper   DJD (degenerative joint disease)    DVT (deep venous thrombosis) (HCC)    Dyspnea    GERD (gastroesophageal reflux disease)    History of BPH    Hyperlipidemia    Hypertension    Hypothyroidism    Iron deficiency anemia due to chronic blood loss 06/29/2019   Lumbar post-laminectomy syndrome    Myocardial infarction [facility])    Pulmonary embolism ([place]) 2009   hx of   RBBB (right bundle branch block)    Renal artery stenosis (HCC)    Thrombocytopenia (HCC)    Thyroid disease    HYPOTHYROIDISM   Type 2 diabetes mellitus without complication, without long-term current use of insulin ([place]) 07/28/2019   Unstable angina ([place])    NONE IN LONG TIME    Social History   Socioeconomic History   Marital status: Widowed    Spouse name: Not on file   Number of children: 2   Years of education: Not on file   Highest education level: Not on file  Occupational History   Occupation: RETIRED     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, [place]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  Tobacco Use   Smoking status: Former    Types: Cigarettes    Quit date: 04/13/1974    Years  since quitting: 47.0   Smokeless tobacco: Former    Types: Chew    Quit date: 01/26/2002   Tobacco comments:    He has smoked about 27-pack-year hx   Vaping Use   Vaping Use: Never used  Substance and Sexual Activity   Alcohol use: Not Currently    Alcohol/week: 2.0 standard drinks    Types: 2 Cans of beer per week    Comment: He used to drink more heavily , but rarely drinks at the current time   Drug use: No   Sexual activity: Not on file  Other Topics Concern   Not on file  Social History Narrative   [place].   HE HAS 2 GROWN CHILDREN   HE IS RETIRED PRISON FIRM SUPERINTENDENT.   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   HE GOES TO THE [employer] 3 X WEEKLY.   Social Determinants of Health    Financial Resource Strain: Low Risk    Difficulty of Paying Living Expenses: Not hard at all  Food Insecurity: No Food Insecurity   Worried About Charity fundraiser in the Last Year: Never true   [place] in the Last Year: Never true  Transportation Needs: No Transportation Needs   Lack of Transportation (Medical): No   Lack of Transportation (Non-Medical): No  Physical Activity: Insufficiently Active   Days of Exercise per Week: 7 days   Minutes of Exercise per Session: 20 min  Stress: No Stress Concern Present   Feeling of Stress : Only a little  Social Connections: Socially Isolated   Frequency of Communication with Friends and Family: Never   Frequency of Social Gatherings with Friends and Family: More than three times a week   Attends Religious Services: Never   Marine scientist or Organizations: No   Attends Archivist Meetings: Never   Marital Status: Widowed  Intimate Partner Violence: Not At Risk   Fear of Current or Ex-Partner: No   Emotionally Abused: No   Physically Abused: No   Sexually Abused: No    Past Surgical History:  Procedure Laterality Date   Blood clot  Feb.20,  2016   Pulmonary Embolism   CARDIOVERSION N/A 03/15/2019   Procedure: CARDIOVERSION;  Surgeon: Pixie Casino, MD;  Location: [facility] ENDOSCOPY;  Service: Cardiovascular;  Laterality: N/A;   CAROTID ENDARTERECTOMY Right Oct. 17, 2013   CE   CAROTID ENDARTERECTOMY Left Dec. 3, 2016   CE   CATARACT EXTRACTION  05/2014,06/2014   COLONOSCOPY WITH PROPOFOL N/A 05/20/2019   Procedure: COLONOSCOPY WITH PROPOFOL;  Surgeon: [name]., MD;  Location: [place];  Service: Gastroenterology;  Laterality: N/A;   CORONARY ARTERY BYPASS GRAFT  11-21-09   EMERGENT X 3 GRAFTING. ..[name], MD, cc: [name]   ENDARTERECTOMY  01/28/2012   Procedure: ENDARTERECTOMY CAROTID;  Surgeon: [name], MD;  Location: [place];  Service: Vascular;  Laterality:  Right;  with Primary Closure of Artery   ENDARTERECTOMY  03/15/2012   Procedure: ENDARTERECTOMY CAROTID;  Surgeon: [name], MD;  Location: [place];  Service: Vascular;  Laterality: Left;   ENTEROSCOPY N/A 05/23/2019   Procedure: ENTEROSCOPY;  Surgeon: [name], MD;  Location: [place];  Service: Endoscopy;  Laterality: N/A;   ESOPHAGOGASTRODUODENOSCOPY N/A 05/20/2019   Procedure: ESOPHAGOGASTRODUODENOSCOPY (EGD);  Surgeon: [name]., MD;  Location: [place];  Service: Gastroenterology;  Laterality: N/A;   ESOPHAGOGASTRODUODENOSCOPY (EGD) WITH PROPOFOL N/A 05/19/2019   Procedure: ESOPHAGOGASTRODUODENOSCOPY (EGD) WITH PROPOFOL;  Surgeon: [name], MD;  Location: [place];  Service: Endoscopy;  Laterality: N/A;   FOOT SURGERY     GIVENS CAPSULE STUDY N/A 05/20/2019   Procedure: GIVENS CAPSULE STUDY;  Surgeon: [name]., MD;  Location: [place];  Service: Gastroenterology;  Laterality: N/A;   HOT HEMOSTASIS N/A 05/23/2019   Procedure: HOT HEMOSTASIS (ARGON PLASMA COAGULATION/BICAP);  Surgeon: [name], MD;  Location: [place];  Service: Endoscopy;  Laterality: N/A;   MANDIBLE SURGERY     MAXIMUM ACCESS (MAS)POSTERIOR LUMBAR INTERBODY FUSION (PLIF) 2 LEVEL N/A 08/08/2015   Procedure: Lumbar Four-Five, Lumbar Five-Sacral One Maximum access posterior lumbar interbody fusion;  Surgeon: [name], MD;  Location: [place] NEURO ORS;  Service: Neurosurgery;  Laterality: N/A;  LUMBAR FOUR-FIVE ,LUMBAR FIVE -SACRAL Maximum access posterior lumbar interbody fusion   POLYPECTOMY  05/20/2019   Procedure: POLYPECTOMY;  Surgeon: [name], Telford Nab., MD;  Location: [place];  Service: Gastroenterology;;   RENAL ARTERY STENT  2010   REPLACEMENT TOTAL KNEE  2006   RIGHT   SPINAL CORD STIMULATOR INSERTION N/A 06/18/2017   Procedure: LUMBAR SPINAL CORD STIMULATOR INSERTION;  Surgeon: [name], MD;  Location: [place];  Service: Neurosurgery;   Laterality: N/A;  LUMBAR SPINAL CORD STIMULATOR INSERTION   TOTAL HIP ARTHROPLASTY     right    Family History  Problem Relation Age of Onset   Lung cancer Father 67       deceased    Cancer Father 26       LUNG   Stroke Mother 57       DECEASED    Hyperlipidemia Mother    Heart attack Brother 63       deceased   Diabetes Brother    Liver cancer Sister 33       deceased    Cancer Sister     No Known Allergies  Current Outpatient Medications on File Prior to Visit  Medication Sig Dispense Refill   amiodarone (PACERONE) 200 MG tablet Take 1 tablet (200 mg total) by mouth daily. 90 tablet 3   cetirizine (ZYRTEC) 10 MG tablet TAKE 1 TABLET BY MOUTH ONCE DAILY as needed FOR ALLERGIES. 90 tablet 3   ferrous sulfate 324 MG TBEC Take 324 mg by mouth at bedtime.     furosemide (LASIX) 40 MG tablet Take 40 mg by mouth in the morning.     levothyroxine (SYNTHROID) 100 MCG tablet TAKE 1 TABLET BY MOUTH ON EMPTY STOMACH WITH WATER ONLY ON SUNDAY THROUGH FRIDAY. TAKE 1 & 1/2 TABS SATURDAY ONLY.NO FOOD OR MEDS FOR 30 MINUTES AFTER. 94 tablet 1   LINZESS 145 MCG CAPS capsule Take 1 capsule (145 mcg total) by mouth every other day. For constipation 45 capsule 1   Rivaroxaban (XARELTO) 15 MG TABS tablet TAKE 1 TABLET BY MOUTH ONCE A DAY WITH SUPPER 90 tablet 3   rosuvastatin (CRESTOR) 10 MG tablet TAKE ONE TABLET BY MOUTH IN THE EVENING FOR CHOLESTEROL. 90 tablet 3   tamsulosin (FLOMAX) 0.4 MG CAPS capsule TAKE 1 CAPSULE BY MOUTH ONCE DAILY. FOR URINE FLOW. 90 capsule 3   vitamin B-12 (CYANOCOBALAMIN) 1000 MCG tablet TAKE 1 TABLET BY MOUTH ONCE DAILY 90 tablet 1   No current facility-administered medications on file prior to visit.    BP 134/78    Pulse 79    Temp 98.3 F (36.8 C) (  Temporal)    Ht 6\' 2"  (1.88 m)    Wt 226 lb (102.5 kg)    SpO2 94%    BMI 29.02 kg/m  Objective:   Physical Exam Cardiovascular:     Rate and Rhythm: Normal rate and regular rhythm.  Pulmonary:     Effort:  Pulmonary effort is normal.     Breath sounds: Normal breath sounds. No wheezing or rales.  Musculoskeletal:     Cervical back: Neck supple.  Skin:    General: Skin is warm and dry.     Comments: Thickened toenails with evidence of toe nail fungus to most toenails.   Neurological:     Mental Status: He is alert and oriented to person, place, and time.          Assessment & Plan:      This visit occurred during the SARS-CoV-2 public health emergency.  Safety protocols were in place, including screening questions prior to the visit, additional usage of staff PPE, and extensive cleaning of exam room while observing appropriate contact time as indicated for disinfecting solutions.

## 2021-04-24 NOTE — Assessment & Plan Note (Addendum)
Controlled in the office today with a manual BP reading, also on recheck.  Home BP readings are high, but according to son, come down to below 830 systolic. This is reassuring.   Long discussion with patient and his son regarding BP numbers. We agreed that his BP was stable and to forego additional treatment.  He will follow up with his cardiologist soon.  Continue furosemide 40 mg daily.

## 2021-05-01 ENCOUNTER — Other Ambulatory Visit: Payer: Self-pay

## 2021-05-01 ENCOUNTER — Ambulatory Visit: Payer: Medicare Other | Admitting: Podiatry

## 2021-05-01 DIAGNOSIS — E119 Type 2 diabetes mellitus without complications: Secondary | ICD-10-CM | POA: Diagnosis not present

## 2021-05-01 DIAGNOSIS — M79675 Pain in left toe(s): Secondary | ICD-10-CM

## 2021-05-01 DIAGNOSIS — B351 Tinea unguium: Secondary | ICD-10-CM

## 2021-05-01 DIAGNOSIS — M79674 Pain in right toe(s): Secondary | ICD-10-CM | POA: Diagnosis not present

## 2021-05-01 NOTE — Progress Notes (Signed)
°  Subjective:  Patient ID: Leonard Brace Sr., male    DOB: 03/11/1930,  MRN: [id number]  Chief Complaint  Patient presents with   Nail Problem    Possible nail fungus    88 y.o. male returns for the above complaint.  Patient presents with thickened elongated dystrophic toenails x10.  Mild pain on palpation.  Patient would like for me to debride down his not able to do himself.  He denies any other acute complaints he is a diet-controlled diabetic  Objective:  There were no vitals filed for this visit. Podiatric Exam: Vascular: dorsalis pedis and posterior tibial pulses are palpable bilateral. Capillary return is immediate. Temperature gradient is WNL. Skin turgor WNL  Sensorium: Normal Semmes Weinstein monofilament test. Normal tactile sensation bilaterally. Nail Exam: Pt has thick disfigured discolored nails with subungual debris noted bilateral entire nail hallux through fifth toenails.  Pain on palpation to the nails. Ulcer Exam: There is no evidence of ulcer or pre-ulcerative changes or infection. Orthopedic Exam: Muscle tone and strength are WNL. No limitations in general ROM. No crepitus or effusions noted.  Skin: No Porokeratosis. No infection or ulcers    Assessment & Plan:   1. Pain due to onychomycosis of toenails of both feet     Patient was evaluated and treated and all questions answered.  Onychomycosis with pain  -Nails palliatively debrided as below. -Educated on self-care  Procedure: Nail Debridement Rationale: pain  Type of Debridement: manual, sharp debridement. Instrumentation: Nail nipper, rotary burr. Number of Nails: 10  Procedures and Treatment: Consent by patient was obtained for treatment procedures. The patient understood the discussion of treatment and procedures well. All questions were answered thoroughly reviewed. Debridement of mycotic and hypertrophic toenails, 1 through 5 bilateral and clearing of subungual debris. No ulceration, no  infection noted.  Return Visit-Office Procedure: Patient instructed to return to the office for a follow up visit 3 months for continued evaluation and treatment.  Boneta Lucks, DPM    No follow-ups on file.

## 2021-05-21 DIAGNOSIS — I129 Hypertensive chronic kidney disease with stage 1 through stage 4 chronic kidney disease, or unspecified chronic kidney disease: Secondary | ICD-10-CM | POA: Diagnosis not present

## 2021-05-21 DIAGNOSIS — N189 Chronic kidney disease, unspecified: Secondary | ICD-10-CM | POA: Diagnosis not present

## 2021-05-21 DIAGNOSIS — N2581 Secondary hyperparathyroidism of renal origin: Secondary | ICD-10-CM | POA: Diagnosis not present

## 2021-05-21 DIAGNOSIS — N1831 Chronic kidney disease, stage 3a: Secondary | ICD-10-CM | POA: Diagnosis not present

## 2021-06-16 ENCOUNTER — Telehealth: Payer: Self-pay

## 2021-06-16 NOTE — Progress Notes (Signed)
? ? ?Chronic Care Management ?Pharmacy Assistant  ? ?Name: Leonard Berg [facility] Sr.  MRN: [id number] DOB: 26-Mar-1930 ? ?Reason for Encounter: CCM (Hypertension Disease State) ?  ?Recent office visits:  ?04/24/2021 - Leonard Friendly, NP - Patient presented for Onychomycosis. Referral to Podiatry.  ?04/22/2021 Leonard Brigham, LPN - Patient presented for Annual Wellness Visit.  ? ?Recent consult visits:  ?05/01/2021 - Leonard Berg, DPM - Patient presented for nail problem. Debridement of mycotic and hypertrophic toenails, 1 through 5 bilateral and clearing of subungual debris. ? ?Hospital visits:  ?None in previous 6 months ? ?Medications: ?Outpatient Encounter Medications as of 06/16/2021  ?Medication Sig Note  ? amiodarone (PACERONE) 200 MG tablet Take 1 tablet (200 mg total) by mouth daily.   ? cetirizine (ZYRTEC) 10 MG tablet TAKE 1 TABLET BY MOUTH ONCE DAILY as needed FOR ALLERGIES.   ? ferrous sulfate 324 MG TBEC Take 324 mg by mouth at bedtime.   ? furosemide (LASIX) 40 MG tablet Take 40 mg by mouth in the morning.   ? levothyroxine (SYNTHROID) 100 MCG tablet TAKE 1 TABLET BY MOUTH ON EMPTY STOMACH WITH WATER ONLY ON SUNDAY THROUGH FRIDAY. TAKE 1 & 1/2 TABS SATURDAY ONLY.NO FOOD OR MEDS FOR 30 MINUTES AFTER.   ? LINZESS 145 MCG CAPS capsule Take 1 capsule (145 mcg total) by mouth every other day. For constipation   ? Rivaroxaban (XARELTO) 15 MG TABS tablet TAKE 1 TABLET BY MOUTH ONCE A DAY WITH SUPPER   ? rosuvastatin (CRESTOR) 10 MG tablet TAKE ONE TABLET BY MOUTH IN THE EVENING FOR CHOLESTEROL.   ? tamsulosin (FLOMAX) 0.4 MG CAPS capsule TAKE 1 CAPSULE BY MOUTH ONCE DAILY. FOR URINE FLOW. 12/13/2020: Takes PRN for urine flow issues, has not taken in last 30 days  ? vitamin B-12 (CYANOCOBALAMIN) 1000 MCG tablet TAKE 1 TABLET BY MOUTH ONCE DAILY   ? ?No facility-administered encounter medications on file as of 06/16/2021.  ? ? ?Recent Office Vitals: ?BP Readings from Last 3 Encounters:  ?04/24/21 134/78  ?11/07/20  120/70  ?09/27/20 134/72  ? ?Pulse Readings from Last 3 Encounters:  ?04/24/21 79  ?11/07/20 71  ?09/27/20 (!) 57  ?  ?Wt Readings from Last 3 Encounters:  ?04/24/21 226 lb (102.5 kg)  ?04/22/21 211 lb (95.7 kg)  ?11/07/20 211 lb (95.7 kg)  ?  ? ?Kidney Function ?Lab Results  ?Component Value Date/Time  ? CREATININE 1.6 (A) 11/13/2020 12:00 AM  ? CREATININE 1.67 (H) 09/23/2020 02:48 AM  ? CREATININE 1.50 (H) 09/22/2020 01:56 AM  ? GFR 49.83 (L) 04/19/2018 10:38 AM  ? GFRNONAA 41 11/13/2020 12:00 AM  ? GFRNONAA 39 (L) 09/23/2020 02:48 AM  ? GFRAA 48 07/27/2019 12:00 AM  ? ? ?BMP Latest Ref Rng & Units 11/13/2020 09/23/2020 09/22/2020  ?Glucose 70 - 99 mg/dL - 139(H) -  ?BUN 4 - '21 18 17 '$ -  ?Creatinine 0.6 - 1.3 1.6(A) 1.67(H) -  ?BUN/Creat Ratio 10 - 24 - - -  ?Sodium 137 - 147 142 137 -  ?Potassium 3.4 - 5.3 4.3 3.9 3.6  ?Chloride 99 - 108 103 104 -  ?CO2 13 - 22 30(A) 26 -  ?Calcium 8.7 - 10.7 9.1 8.2(L) -  ? ?Contacted patient on 06/16/2021 to discuss hypertension disease state. I spoke with patient's son Leonard Berg) for the encounter.  ? ?Current antihypertensive regimen:  ?Furosemide 40 mg daily ? Patient verbally confirms he is taking the above medications as directed. Yes ? ?How often are you  checking your Blood Pressure? [name] take his blood pressure ever 3-4 days. ? ?Wrist or arm cuff: Arm cuff ?Caffeine intake: Patient drinks 1 cup of coffee and 1 soda daily ?Salt intake: Patient does not use salt ?Over the counter medications including pseudoephedrine or NSAIDs? Patient does not use anything OTC ? ?Any readings above 180/120? No ? ?What recent interventions/DTPs have been made by any provider to improve Blood Pressure control since last CPP Visit: No recent interventions ? ?Any recent hospitalizations or ED visits since last visit with CPP? No ? ?What diet changes have been made to improve Blood Pressure Control?  ?[name] states the patient has cut down on soft drinks drastically; Patient usually  eats sandwiches and salads. Patient also loves beans. Patient will eat oatmeal cookies and reese cups, but not a lot.  ? ?What exercise is being done to improve your Blood Pressure Control?  ? Walks around the house inside and outside, but that is all the exercise the patient will do. ? ?I have asked the patient's son to take his father's blood pressure daily starting tomorrow and keep a log. I advised him I would call back on 03/10 for the log. He understood and agreed. [name] stated his dad has a Cardiology appointment in April and he is going to being taking his blood pressure daily and keep a log for that appointment as well.  ? ?Adherence Review: ?Is the patient currently on ACE/ARB medication? No ?Does the patient have >5 day gap between last estimated fill dates? No ? ?Star Rating Drugs:  ?Medication:  Last Fill: Day Supply ?Rosuvastatin 10 mg 05/06/2021 90  ?Fill dates verified with [facility] ? ?Care Gaps: ?Annual wellness visit in last year? Yes 04/22/2021 ?Most Recent BP reading: 134/78 on 04/24/2021 ? ?If Diabetic: ?Most recent A1C reading: 5.8 on 11/07/2020 ?Last eye exam / retinopathy screening: Never done per care gap ?Last diabetic foot exam: Over due ? ?Upcoming appointments: ?No appointments scheduled within the next 30 days. ? ?Leonard Berg, CPP notified ? ?Leonard Berg, RMA ?Clinical Pharmacy Assistant ?[phone] ? ?Time Spent: 30 Minutes ? ? ? ? ? ?

## 2021-06-20 NOTE — Progress Notes (Signed)
Rickey Primus for his father's blood pressure log as previously discussed.  ? ?Date  Blood Pressure ?03/10  147/70 ?03/09  156/81 ?03/08  151/66 ?03/07  149/64 ? ?Charlene Brooke, CPP notified ? ?Marijean Niemann, RMA ?Clinical Pharmacy Assistant ?[phone] ? ? ? ?

## 2021-07-08 ENCOUNTER — Other Ambulatory Visit: Payer: Self-pay | Admitting: Primary Care

## 2021-07-08 DIAGNOSIS — E038 Other specified hypothyroidism: Secondary | ICD-10-CM

## 2021-07-31 DIAGNOSIS — I5032 Chronic diastolic (congestive) heart failure: Secondary | ICD-10-CM | POA: Insufficient documentation

## 2021-07-31 NOTE — Progress Notes (Signed)
?Cardiology Office Note:   ? ?Date:  08/01/2021  ? ?ID:  Leonard Brace Sr., DOB 1930-01-20, MRN [id number] ? ?PCP:  [name], NP  ?[facility] Providers ?Cardiologist:  [name], MD     ?Referring MD: [name], NP  ? ?Chief Complaint:  Follow-up for CAD, CHF, A-fib ?  ? ?Patient Profile: ?Coronary artery disease  ?S/p CABG in 2011 ?Paroxysmal atrial fibrillation  ?Rx - Amiodarone  ?HFimpEF (heart failure with improved ejection fraction)  ?Tachycardia induced CM  ?Echocardiogram 02/2019: EF 30-35 ?Echocardiogram 3/22: EF 60-65 ?Hypertension  ?Hyperlipidemia  ?Diabetes mellitus  ?Peripheral arterial disease  ?S/p RA stenting in 2012 ?Carotid artery disease ?S/p bilateral CEAs in 2013 ?Hx of recurrent pulmonary embolism on chronic anticoagulation  ?Hx of CVA ?CT in 6/22: bilateral cerebellar lacunar infarcts  ?Hx of GI bleed in 2021 req'd PRBCs  ?GERD ?Hypothyroidism  ?Right Bundle Branch Block  ? ?Prior CV Studies: ?Echocardiogram 06/26/20 ?EF 60-65, no RWMA, mild LVH, Gr 1 DD, mildly reduced RVSF, AV sclerosis w/o AS, mild dilation of aortic root (40 mm) ? ?Echocardiogram 02/28/2019 ?EF 30-35 ? ?Carotid US 09/22/17 ?Patent bilateral CEAs  ? ?History of Present Illness:   ?Leonard Trulson Sr. is a 88 y.o. male with the above problem list.  He was last seen by Dr. Burt Knack in February 2022.  A follow-up echocardiogram demonstrated improved LV function with normal EF.  He returns for follow-up.  He is here today with his son.  He notes issues with his balance.  He has fallen but has not hit his head.  He uses a walker.  He has not had exertional chest pain, shortness of breath.  He has not had syncope.  He has mild edema in his legs that is unchanged.  He has slept in a recliner for years.       ?   ?Past Medical History:  ?Diagnosis Date  ? Anemia   ? Anxiety   ? Arrhythmia   ? PAROXYSMAL ATRIAL FIBRILLATION  ? Arthritis   ? OSTEOARTHRITIS  ? Cataracts, bilateral   ? Chronic  back pain   ? Coronary artery disease 8/11  ? CABG...[facility] WITH IABP sees Dr. Legrand Como cooper  ? DJD (degenerative joint disease)   ? DVT (deep venous thrombosis) ([place])   ? Dyspnea   ? GERD (gastroesophageal reflux disease)   ? History of BPH   ? Hyperlipidemia   ? Hypertension   ? Hypothyroidism   ? Iron deficiency anemia due to chronic blood loss 06/29/2019  ? Lumbar post-laminectomy syndrome   ? Myocardial infarction [facility])   ? Pulmonary embolism ([place]) 2009  ? hx of  ? RBBB (right bundle branch block)   ? Renal artery stenosis ([place])   ? Thrombocytopenia ([place])   ? Thyroid disease   ? HYPOTHYROIDISM  ? Type 2 diabetes mellitus without complication, without long-term current use of insulin ([place]) 07/28/2019  ? Unstable angina ([facility])   ? NONE IN LONG TIME  ? ?Current Medications: ?Current Meds  ?Medication Sig  ? amiodarone (PACERONE) 200 MG tablet Take 1 tablet (200 mg total) by mouth daily.  ? cetirizine (ZYRTEC) 10 MG tablet TAKE 1 TABLET BY MOUTH ONCE DAILY as needed FOR ALLERGIES.  ? ferrous sulfate 324 MG TBEC Take 324 mg by mouth at bedtime.  ? furosemide (LASIX) 40 MG tablet Take 40 mg by mouth in the morning.  ? levothyroxine (SYNTHROID) 100 MCG tablet TAKE 1 TABLET BY MOUTH  ON EMPTY STOMACH WITH WATER ONLY ON SUNDAY THROUGH FRIDAY. TAKE 1 & 1/2 TABS SATURDAY ONLY.NO FOOD OR MEDS FOR 30 MINUTES AFTER.  ? LINZESS 145 MCG CAPS capsule Take 1 capsule (145 mcg total) by mouth every other day. For constipation  ? Rivaroxaban (XARELTO) 15 MG TABS tablet TAKE 1 TABLET BY MOUTH ONCE A DAY WITH SUPPER  ? rosuvastatin (CRESTOR) 10 MG tablet TAKE ONE TABLET BY MOUTH IN THE EVENING FOR CHOLESTEROL.  ? vitamin B-12 (CYANOCOBALAMIN) 1000 MCG tablet TAKE 1 TABLET BY MOUTH ONCE DAILY  ?  ?Allergies:   Patient has no known allergies.  ? ?Social History  ? ?Tobacco Use  ? Smoking status: Former  ?  Types: Cigarettes  ?  Quit date: 04/13/1974  ?  Years since quitting: 47.3  ? Smokeless tobacco: Former  ?  Types: Chew  ?  Quit  date: 01/26/2002  ? Tobacco comments:  ?  He has smoked about 27-pack-year hx   ?Vaping Use  ? Vaping Use: Never used  ?Substance Use Topics  ? Alcohol use: Not Currently  ?  Alcohol/week: 2.0 standard drinks  ?  Types: 2 Cans of beer per week  ?  Comment: He used to drink more heavily , but rarely drinks at the current time  ? Drug use: No  ?  ?Family Hx: ?The patient's family history includes Cancer in his sister; Cancer (age of onset: 86) in his father; Diabetes in his brother; Heart attack (age of onset: 67) in his brother; Hyperlipidemia in his mother; Liver cancer (age of onset: 12) in his sister; Lung cancer (age of onset: 7) in his father; Stroke (age of onset: 68) in his mother. ? ?Review of Systems  ?Gastrointestinal:  Positive for hematochezia.  ?Genitourinary:  Negative for hematuria.   ? ?EKGs/Labs/Other Test Reviewed:   ? ?EKG:  EKG is   ordered today.  The ekg ordered today demonstrates NSR, PACs, Right Bundle Branch Block, QTc 515 ms, no change from prior tracing ? ?Recent Labs: ?11/07/2020: TSH 2.13 ?11/13/2020: BUN 18; Creatinine 1.6; Hemoglobin 14.4; Platelets 169; Potassium 4.3; Sodium 142  ? ?Recent Lipid Panel ?No results for input(s): CHOL, TRIG, HDL, VLDL, LDLCALC, LDLDIRECT in the last 8760 hours.  ? ?Risk Assessment/Calculations:   ? ?CHA2DS2-VASc Score = 7  ? This indicates a 11.2% annual risk of stroke. ?The patient's score is based upon: ?CHF History: 1 ?HTN History: 1 ?Diabetes History: 0 ?Stroke History: 2 ?Vascular Disease History: 1 ?Age Score: 2 ?Gender Score: 0 ?  ? ?    ?Physical Exam:   ? ?VS:  BP 140/60   Pulse 73   Ht _0  (1.88 m)   Wt 225 lb 6.4 oz (102.2 kg)   SpO2 95%   BMI 28.94 kg/m?    ? ?Wt Readings from Last 3 Encounters:  ?08/01/21 225 lb 6.4 oz (102.2 kg)  ?04/24/21 226 lb (102.5 kg)  ?04/22/21 211 lb (95.7 kg)  ?  ?Constitutional:   ?   Appearance: Healthy appearance. Not in distress.  ?Neck:  ?   Vascular: JVD normal.  ?Pulmonary:  ?   Effort: Pulmonary effort  is normal.  ?   Breath sounds: No wheezing. No rales.  ?Cardiovascular:  ?   Normal rate. Regular rhythm. Normal S1. Normal S2.   ?   Murmurs: There is a grade 2/6 systolic murmur at the LLSB.  ?Edema: ?   Peripheral edema present. ?   Ankle: bilateral trace edema of the  ankle. ?Abdominal:  ?   Palpations: Abdomen is soft.  ?Skin: ?   General: Skin is warm and dry.  ?Neurological:  ?   General: No focal deficit present.  ?   Mental Status: Alert and oriented to person, place and time.  ?   Cranial Nerves: Cranial nerves are intact.  ?   ?    ?ASSESSMENT & PLAN:   ?Carotid artery disease ([place]) ?Status post bilateral CEA in 2013.  Ultrasound in 2019 demonstrated patent CEA sites.  We discussed whether or not to proceed with further testing.  He is okay with this.  Obtain follow-up carotid US.  If overall stable, consider as needed follow-up. ? ?Coronary artery disease involving native coronary artery of native heart without angina pectoris ?History of CABG in 2011.  He is doing well without anginal symptoms.  He is not on aspirin as he is on Xarelto.  Continue Crestor 10 mg daily. ? ?Essential hypertension ?Blood pressure well controlled. ? ?HFimpEF (heart failure with improved EF) ?Tachycardia induced cardiomyopathy.  EF was 30-35.  Echo in March 2022 demonstrated normal LV function with EF 60-65. ? ?PAF (paroxysmal atrial fibrillation) ([place]) ?He remains on amiodarone.  He is maintaining sinus rhythm.  Continue amiodarone 200 mg daily.  He notes recent history of rectal bleeding.  Obtain CBC, TSH, CMET.  If he has recurrent bleeding, he knows to follow-up with primary care.  Creatinine clearance 45.  Continue Xarelto 15 mg daily. ? ?History of pulmonary embolism ?He remains on chronic anticoagulation with Xarelto. ?   ?     ?   ?Dispo:  Return in about 1 year (around 08/02/2022) for Routine Follow Up, w/ Dr. Burt Knack, or Richardson Dopp, PA-C.  ? ?Medication Adjustments/Labs and Tests Ordered: ?Current medicines are  reviewed at length with the patient today.  Concerns regarding medicines are outlined above.  ?Tests Ordered: ?Orders Placed This Encounter  ?Procedures  ? TSH  ? CBC  ? Comp Met (CMET)  ? VAS US CAROTID  ? ?Medicat

## 2021-08-01 ENCOUNTER — Encounter: Payer: Self-pay | Admitting: Physician Assistant

## 2021-08-01 ENCOUNTER — Ambulatory Visit: Payer: Medicare Other | Admitting: Physician Assistant

## 2021-08-01 ENCOUNTER — Other Ambulatory Visit (INDEPENDENT_AMBULATORY_CARE_PROVIDER_SITE_OTHER): Payer: Medicare Other

## 2021-08-01 VITALS — BP 140/60 | HR 73 | Ht 74.0 in | Wt 225.4 lb

## 2021-08-01 DIAGNOSIS — I2510 Atherosclerotic heart disease of native coronary artery without angina pectoris: Secondary | ICD-10-CM

## 2021-08-01 DIAGNOSIS — I6523 Occlusion and stenosis of bilateral carotid arteries: Secondary | ICD-10-CM

## 2021-08-01 DIAGNOSIS — I480 Paroxysmal atrial fibrillation: Secondary | ICD-10-CM | POA: Diagnosis not present

## 2021-08-01 DIAGNOSIS — Z86711 Personal history of pulmonary embolism: Secondary | ICD-10-CM

## 2021-08-01 DIAGNOSIS — I5032 Chronic diastolic (congestive) heart failure: Secondary | ICD-10-CM

## 2021-08-01 DIAGNOSIS — I10 Essential (primary) hypertension: Secondary | ICD-10-CM

## 2021-08-01 DIAGNOSIS — E782 Mixed hyperlipidemia: Secondary | ICD-10-CM

## 2021-08-01 LAB — COMPREHENSIVE METABOLIC PANEL
ALT: 14 IU/L (ref 0–44)
AST: 21 IU/L (ref 0–40)
Albumin/Globulin Ratio: 1.5 (ref 1.2–2.2)
Albumin: 3.5 g/dL — ABNORMAL LOW (ref 3.6–4.6)
Alkaline Phosphatase: 91 IU/L (ref 44–121)
BUN/Creatinine Ratio: 10 (ref 10–24)
BUN: 17 mg/dL (ref 8–27)
Bilirubin Total: 0.5 mg/dL (ref 0.0–1.2)
CO2: 27 mmol/L (ref 20–29)
Calcium: 8.4 mg/dL — ABNORMAL LOW (ref 8.6–10.2)
Chloride: 102 mmol/L (ref 96–106)
Creatinine, Ser: 1.74 mg/dL — ABNORMAL HIGH (ref 0.76–1.27)
Globulin, Total: 2.4 g/dL (ref 1.5–4.5)
Glucose: 99 mg/dL (ref 70–99)
Potassium: 3.4 mmol/L — ABNORMAL LOW (ref 3.5–5.2)
Sodium: 142 mmol/L (ref 134–144)
Total Protein: 5.9 g/dL — ABNORMAL LOW (ref 6.0–8.5)
eGFR: 37 mL/min/{1.73_m2} — ABNORMAL LOW (ref >59)

## 2021-08-01 LAB — CBC
Hematocrit: 41.8 % (ref 37.5–51.0)
Hemoglobin: 13.7 g/dL (ref 13.0–17.7)
MCH: 32.4 pg (ref 26.6–33.0)
MCHC: 32.8 g/dL (ref 31.5–35.7)
MCV: 99 fL — ABNORMAL HIGH (ref 79–97)
Platelets: 154 10*3/uL (ref 150–450)
RBC: 4.23 x10E6/uL (ref 4.14–5.80)
RDW: 11.9 % (ref 11.6–15.4)
WBC: 6 10*3/uL (ref 3.4–10.8)

## 2021-08-01 LAB — TSH: TSH: 3.86 u[IU]/mL (ref 0.450–4.500)

## 2021-08-01 NOTE — Assessment & Plan Note (Signed)
He remains on chronic anticoagulation with Xarelto. ?

## 2021-08-01 NOTE — Assessment & Plan Note (Signed)
History of CABG in 2011.  He is doing well without anginal symptoms.  He is not on aspirin as he is on Xarelto.  Continue Crestor 10 mg daily. ?

## 2021-08-01 NOTE — Assessment & Plan Note (Signed)
He remains on amiodarone.  He is maintaining sinus rhythm.  Continue amiodarone 200 mg daily.  He notes recent history of rectal bleeding.  Obtain CBC, TSH, CMET.  If he has recurrent bleeding, he knows to follow-up with primary care.  Creatinine clearance 45.  Continue Xarelto 15 mg daily. ?

## 2021-08-01 NOTE — Assessment & Plan Note (Signed)
Tachycardia induced cardiomyopathy.  EF was 30-35.  Echo in March 2022 demonstrated normal LV function with EF 60-65. ?

## 2021-08-01 NOTE — Assessment & Plan Note (Signed)
Status post bilateral CEA in 2013.  Ultrasound in 2019 demonstrated patent CEA sites.  We discussed whether or not to proceed with further testing.  He is okay with this.  Obtain follow-up carotid US.  If overall stable, consider as needed follow-up. ?

## 2021-08-01 NOTE — Assessment & Plan Note (Signed)
Blood pressure well controlled

## 2021-08-01 NOTE — Patient Instructions (Addendum)
Medication Instructions:  ?Your physician recommends that you continue on your current medications as directed. Please refer to the Current Medication list given to you today. ? ?*If you need a refill on your cardiac medications before your next appointment, please call your pharmacy* ? ? ?Lab Work: ?TODAY:  BMET, CBC, & TSH ? ?If you have labs (blood work) drawn today and your tests are completely normal, you will receive your results only by: ?MyChart Message (if you have MyChart) OR ?A paper copy in the mail ?If you have any lab test that is abnormal or we need to change your treatment, we will call you to review the results. ? ? ?Testing/Procedures: ?Your physician has requested that you have a carotid duplex. This test is an ultrasound of the carotid arteries in your neck. It looks at blood flow through these arteries that supply the brain with blood. Allow one hour for this exam. There are no restrictions or special instructions. ? ? ? ?Follow-Up: ?At [facility], you and your health needs are our priority.  As part of our continuing mission to provide you with exceptional heart care, we have created designated Provider Care Teams.  These Care Teams include your primary Cardiologist (physician) and Advanced Practice Providers (APPs -  Physician Assistants and Nurse Practitioners) who all work together to provide you with the care you need, when you need it. ? ?We recommend signing up for the patient portal called "MyChart".  Sign up information is provided on this After Visit Summary.  MyChart is used to connect with patients for Virtual Visits (Telemedicine).  Patients are able to view lab/test results, encounter notes, upcoming appointments, etc.  Non-urgent messages can be sent to your provider as well.   ?To learn more about what you can do with MyChart, go to [email or web address].   ? ?Your next appointment:   ?12 month(s) ? ?The format for your next appointment:   ?In Person ? ?Provider:   ?[name], MD  or Richardson Dopp, PA-C       ? ? ?Other Instructions ? ? ?Important Information About [place] ? ? ? ? ?  ?

## 2021-08-02 ENCOUNTER — Other Ambulatory Visit: Payer: Self-pay | Admitting: Cardiovascular Disease

## 2021-08-02 ENCOUNTER — Other Ambulatory Visit: Payer: Self-pay | Admitting: Primary Care

## 2021-08-02 DIAGNOSIS — K5900 Constipation, unspecified: Secondary | ICD-10-CM

## 2021-08-04 ENCOUNTER — Telehealth: Payer: Self-pay | Admitting: Cardiovascular Disease

## 2021-08-04 DIAGNOSIS — Z79899 Other long term (current) drug therapy: Secondary | ICD-10-CM

## 2021-08-04 MED ORDER — POTASSIUM CHLORIDE ER 10 MEQ PO TBCR: 10.0000 meq | EXTENDED_RELEASE_TABLET | Freq: Every day | ORAL | 3 refills | Status: DC

## 2021-08-04 NOTE — Telephone Encounter (Signed)
[name], [name], transferred to Rico Junker, [place] for lab results.  ?

## 2021-08-04 NOTE — Telephone Encounter (Signed)
-----   Message from Liliane Shi, [place] sent at 08/03/2021  9:21 PM EDT ----- ?Hgb, TSH normal.  Creatinine stable.  K+ low.  LFTs normal.   ?PLAN:  ?-Start K+ 10 mEq once daily  ?-BMET 1 week  ?Richardson Dopp, PA-C    ?08/03/2021 9:16 PM   ?

## 2021-08-04 NOTE — Addendum Note (Signed)
Addended by: Briant Cedar on: 08/04/2021 12:12 PM ? ? Modules accepted: Orders ? ?

## 2021-08-12 ENCOUNTER — Other Ambulatory Visit: Payer: Medicare Other | Admitting: *Deleted

## 2021-08-12 DIAGNOSIS — Z79899 Other long term (current) drug therapy: Secondary | ICD-10-CM

## 2021-08-13 LAB — BASIC METABOLIC PANEL
BUN/Creatinine Ratio: 12 (ref 10–24)
BUN: 19 mg/dL (ref 8–27)
CO2: 24 mmol/L (ref 20–29)
Calcium: 8.5 mg/dL — ABNORMAL LOW (ref 8.6–10.2)
Chloride: 101 mmol/L (ref 96–106)
Creatinine, Ser: 1.57 mg/dL — ABNORMAL HIGH (ref 0.76–1.27)
Glucose: 105 mg/dL — ABNORMAL HIGH (ref 70–99)
Potassium: 4.4 mmol/L (ref 3.5–5.2)
Sodium: 139 mmol/L (ref 134–144)
eGFR: 42 mL/min/{1.73_m2} — ABNORMAL LOW (ref >59)

## 2021-08-14 ENCOUNTER — Ambulatory Visit: Payer: Medicare Other | Admitting: Podiatry

## 2021-08-28 ENCOUNTER — Ambulatory Visit (HOSPITAL_COMMUNITY)
Admission: RE | Admit: 2021-08-28 | Discharge: 2021-08-28 | Disposition: A | Discharge status: Home / Self Care | Payer: Medicare Other | Source: Ambulatory Visit | Attending: Internal Medicine | Admitting: Internal Medicine

## 2021-08-28 DIAGNOSIS — I10 Essential (primary) hypertension: Secondary | ICD-10-CM

## 2021-08-28 DIAGNOSIS — I480 Paroxysmal atrial fibrillation: Secondary | ICD-10-CM | POA: Diagnosis not present

## 2021-08-28 DIAGNOSIS — I5032 Chronic diastolic (congestive) heart failure: Secondary | ICD-10-CM | POA: Diagnosis not present

## 2021-08-28 DIAGNOSIS — I2510 Atherosclerotic heart disease of native coronary artery without angina pectoris: Secondary | ICD-10-CM | POA: Diagnosis not present

## 2021-08-28 DIAGNOSIS — I6523 Occlusion and stenosis of bilateral carotid arteries: Secondary | ICD-10-CM

## 2021-09-24 DIAGNOSIS — D2262 Melanocytic nevi of left upper limb, including shoulder: Secondary | ICD-10-CM | POA: Diagnosis not present

## 2021-09-24 DIAGNOSIS — D2261 Melanocytic nevi of right upper limb, including shoulder: Secondary | ICD-10-CM | POA: Diagnosis not present

## 2021-09-24 DIAGNOSIS — D0439 Carcinoma in situ of skin of other parts of face: Secondary | ICD-10-CM | POA: Diagnosis not present

## 2021-09-24 DIAGNOSIS — D2272 Melanocytic nevi of left lower limb, including hip: Secondary | ICD-10-CM | POA: Diagnosis not present

## 2021-09-24 DIAGNOSIS — Z85828 Personal history of other malignant neoplasm of skin: Secondary | ICD-10-CM | POA: Diagnosis not present

## 2021-10-02 ENCOUNTER — Ambulatory Visit: Payer: Medicare Other | Admitting: Podiatry

## 2021-10-09 ENCOUNTER — Other Ambulatory Visit: Payer: Self-pay | Admitting: Primary Care

## 2021-10-09 DIAGNOSIS — E038 Other specified hypothyroidism: Secondary | ICD-10-CM

## 2021-10-09 NOTE — Telephone Encounter (Signed)
Patient is due for general follow up in August. Please have him scheduled.

## 2021-10-13 NOTE — Telephone Encounter (Signed)
Called pt and scheduled a f/u in August

## 2021-10-20 NOTE — Telephone Encounter (Signed)
Looks like there was no OV scheduled in august as stated. Please call patient to schedule appt

## 2021-10-20 NOTE — Telephone Encounter (Signed)
Patient scheduled OV for 12/16/21

## 2021-10-30 ENCOUNTER — Other Ambulatory Visit: Payer: Self-pay | Admitting: Primary Care

## 2021-10-30 DIAGNOSIS — J309 Allergic rhinitis, unspecified: Secondary | ICD-10-CM

## 2021-10-30 DIAGNOSIS — E038 Other specified hypothyroidism: Secondary | ICD-10-CM

## 2021-10-30 DIAGNOSIS — E785 Hyperlipidemia, unspecified: Secondary | ICD-10-CM

## 2021-10-30 DIAGNOSIS — I4891 Unspecified atrial fibrillation: Secondary | ICD-10-CM

## 2021-11-19 DIAGNOSIS — N2581 Secondary hyperparathyroidism of renal origin: Secondary | ICD-10-CM | POA: Diagnosis not present

## 2021-11-19 DIAGNOSIS — I129 Hypertensive chronic kidney disease with stage 1 through stage 4 chronic kidney disease, or unspecified chronic kidney disease: Secondary | ICD-10-CM | POA: Diagnosis not present

## 2021-11-19 DIAGNOSIS — N1832 Chronic kidney disease, stage 3b: Secondary | ICD-10-CM | POA: Diagnosis not present

## 2021-11-19 DIAGNOSIS — N1831 Chronic kidney disease, stage 3a: Secondary | ICD-10-CM | POA: Diagnosis not present

## 2021-11-19 LAB — BASIC METABOLIC PANEL
BUN: 17 (ref 4–21)
CO2: 26 — AB (ref 13–22)
Chloride: 104 (ref 99–108)
Creatinine: 1.6 — AB (ref 0.6–1.3)
Glucose: 85
Potassium: 4.1 mEq/L (ref 3.5–5.1)
Sodium: 140 (ref 137–147)

## 2021-11-19 LAB — COMPREHENSIVE METABOLIC PANEL
Albumin: 4.2 (ref 3.5–5.0)
Calcium: 9.3 (ref 8.7–10.7)
eGFR: 42

## 2021-11-28 ENCOUNTER — Encounter: Payer: Self-pay | Admitting: Primary Care

## 2021-12-03 DIAGNOSIS — D0439 Carcinoma in situ of skin of other parts of face: Secondary | ICD-10-CM | POA: Diagnosis not present

## 2021-12-09 ENCOUNTER — Emergency Department (HOSPITAL_COMMUNITY): Payer: Medicare Other

## 2021-12-09 ENCOUNTER — Emergency Department (HOSPITAL_COMMUNITY)
Admission: EM | Admit: 2021-12-09 | Discharge: 2021-12-09 | Disposition: A | Discharge status: Home / Self Care | Payer: Medicare Other | Attending: Emergency Medicine | Admitting: Emergency Medicine

## 2021-12-09 ENCOUNTER — Telehealth: Payer: Self-pay

## 2021-12-09 DIAGNOSIS — S0990XA Unspecified injury of head, initial encounter: Secondary | ICD-10-CM | POA: Diagnosis not present

## 2021-12-09 DIAGNOSIS — E119 Type 2 diabetes mellitus without complications: Secondary | ICD-10-CM | POA: Diagnosis not present

## 2021-12-09 DIAGNOSIS — Z79899 Other long term (current) drug therapy: Secondary | ICD-10-CM | POA: Diagnosis not present

## 2021-12-09 DIAGNOSIS — I10 Essential (primary) hypertension: Secondary | ICD-10-CM | POA: Diagnosis not present

## 2021-12-09 DIAGNOSIS — S42122A Displaced fracture of acromial process, left shoulder, initial encounter for closed fracture: Secondary | ICD-10-CM | POA: Diagnosis not present

## 2021-12-09 DIAGNOSIS — M25512 Pain in left shoulder: Secondary | ICD-10-CM | POA: Insufficient documentation

## 2021-12-09 DIAGNOSIS — I2510 Atherosclerotic heart disease of native coronary artery without angina pectoris: Secondary | ICD-10-CM | POA: Diagnosis not present

## 2021-12-09 DIAGNOSIS — W19XXXA Unspecified fall, initial encounter: Secondary | ICD-10-CM | POA: Insufficient documentation

## 2021-12-09 DIAGNOSIS — Z7901 Long term (current) use of anticoagulants: Secondary | ICD-10-CM | POA: Insufficient documentation

## 2021-12-09 DIAGNOSIS — S42125A Nondisplaced fracture of acromial process, left shoulder, initial encounter for closed fracture: Secondary | ICD-10-CM | POA: Insufficient documentation

## 2021-12-09 DIAGNOSIS — R079 Chest pain, unspecified: Secondary | ICD-10-CM | POA: Diagnosis not present

## 2021-12-09 DIAGNOSIS — S0003XA Contusion of scalp, initial encounter: Secondary | ICD-10-CM | POA: Diagnosis not present

## 2021-12-09 DIAGNOSIS — S199XXA Unspecified injury of neck, initial encounter: Secondary | ICD-10-CM | POA: Diagnosis not present

## 2021-12-09 DIAGNOSIS — R102 Pelvic and perineal pain: Secondary | ICD-10-CM | POA: Diagnosis not present

## 2021-12-09 DIAGNOSIS — S4992XA Unspecified injury of left shoulder and upper arm, initial encounter: Secondary | ICD-10-CM | POA: Diagnosis present

## 2021-12-09 LAB — CBC WITH DIFFERENTIAL/PLATELET
Abs Immature Granulocytes: 0.06 10*3/uL (ref 0.00–0.07)
Basophils Absolute: 0.1 10*3/uL (ref 0.0–0.1)
Basophils Relative: 1 %
Eosinophils Absolute: 0.1 10*3/uL (ref 0.0–0.5)
Eosinophils Relative: 1 %
HCT: 47.5 % (ref 39.0–52.0)
Hemoglobin: 15.3 g/dL (ref 13.0–17.0)
Immature Granulocytes: 1 %
Lymphocytes Relative: 22 %
Lymphs Abs: 2.2 10*3/uL (ref 0.7–4.0)
MCH: 32.8 pg (ref 26.0–34.0)
MCHC: 32.2 g/dL (ref 30.0–36.0)
MCV: 101.7 fL — ABNORMAL HIGH (ref 80.0–100.0)
Monocytes Absolute: 0.9 10*3/uL (ref 0.1–1.0)
Monocytes Relative: 9 %
Neutro Abs: 6.6 10*3/uL (ref 1.7–7.7)
Neutrophils Relative %: 66 %
Platelets: 173 10*3/uL (ref 150–400)
RBC: 4.67 MIL/uL (ref 4.22–5.81)
RDW: 12.7 % (ref 11.5–15.5)
WBC: 9.9 10*3/uL (ref 4.0–10.5)
nRBC: 0 % (ref 0.0–0.2)

## 2021-12-09 LAB — BASIC METABOLIC PANEL
Anion gap: 9 (ref 5–15)
BUN: 21 mg/dL (ref 8–23)
CO2: 23 mmol/L (ref 22–32)
Calcium: 9.1 mg/dL (ref 8.9–10.3)
Chloride: 106 mmol/L (ref 98–111)
Creatinine, Ser: 1.85 mg/dL — ABNORMAL HIGH (ref 0.61–1.24)
GFR, Estimated: 34 mL/min — ABNORMAL LOW (ref >60)
Glucose, Bld: 121 mg/dL — ABNORMAL HIGH (ref 70–99)
Potassium: 3.8 mmol/L (ref 3.5–5.1)
Sodium: 138 mmol/L (ref 135–145)

## 2021-12-09 LAB — TYPE AND SCREEN
ABO/RH(D): A POS
Antibody Screen: NEGATIVE

## 2021-12-09 LAB — PROTIME-INR
INR: 2.8 — ABNORMAL HIGH (ref 0.8–1.2)
Prothrombin Time: 29.4 seconds — ABNORMAL HIGH (ref 11.4–15.2)

## 2021-12-09 MED ORDER — HYDROMORPHONE HCL 1 MG/ML IJ SOLN
1.0000 mg | Freq: Once | INTRAMUSCULAR | Status: AC
  Administered 2021-12-09: 1 mg via INTRAVENOUS
  Filled 2021-12-09: qty 1

## 2021-12-09 MED ORDER — FENTANYL CITRATE PF 50 MCG/ML IJ SOSY
50.0000 ug | PREFILLED_SYRINGE | Freq: Once | INTRAMUSCULAR | Status: AC
  Administered 2021-12-09: 50 ug via INTRAVENOUS
  Filled 2021-12-09: qty 1

## 2021-12-09 MED ORDER — OXYCODONE-ACETAMINOPHEN 5-325 MG PO TABS
1.0000 | ORAL_TABLET | Freq: Three times a day (TID) | ORAL | 0 refills | Status: AC | PRN
Start: 2021-12-09 — End: 2021-12-12

## 2021-12-09 NOTE — ED Triage Notes (Addendum)
Fall at approximately 11p on xarelto. Positive posterior head injury  and L shoulder injury.  Fall onto hardwood floor from standing. A&Ox4.

## 2021-12-09 NOTE — Discharge Instructions (Signed)
You have fractured your shoulder blade, please wear the sling for comfort, you may use it as tolerated, recommend applying ice to the area, may use over-the-counter medication like Tylenol.  Given a short course of narcotic medication please take as prescribed.  I have given you a short course of narcotics please take as prescribed.  This medication can make you drowsy do not consume alcohol or operate heavy machinery when taking this medication.  This medication is Tylenol in it do not take Tylenol and take this medication.    Please follow-up with orthopedics in 3 weeks time for reassessment of your shoulder.  Come back to the emergency department if you develop chest pain, shortness of breath, severe abdominal pain, uncontrolled nausea, vomiting, diarrhea.

## 2021-12-09 NOTE — Progress Notes (Signed)
Orthopedic Tech Progress Note Patient Details:  Leonard Freimark Sr. 1929-07-24 [id number]  Ortho Devices Type of Ortho Device: Sling immobilizer Ortho Device/Splint Location: lue Ortho Device/Splint Interventions: Ordered, Application, Adjustment   Post Interventions Patient Tolerated: Well Instructions Provided: Care of device, Adjustment of device  Karolee Stamps 12/09/2021, 4:18 AM

## 2021-12-09 NOTE — ED Provider Notes (Addendum)
[facility] EMERGENCY DEPARTMENT Provider Note   CSN: [id number] Arrival date & time: 12/09/21  0028     History  Chief Complaint  Patient presents with   Leonard Bonito Sima Sr. is a 88 y.o. male.  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  Patient states that around 11 he attempted to ambulate to the bathroom while using his walker he fell onto his left side, states that he struck his head, landed on his left shoulder.  He states that he was unable to get up and had to crawl to the phone, states he was on the ground for proxy 1 hour, he denies any loss of conscious, denies any headaches change in vision paresthesias or weakness in the upper or lower extremities.  He is not endorsing any neck pain back pain chest pain stomach pain no difficulty breathing nausea vomiting.  He states that he significant pain in his left shoulder.    Signs at bedside he was able validate the story.  Home Medications Prior to Admission medications   Medication Sig Start Date End Date Taking? Authorizing Provider  oxyCODONE-acetaminophen (PERCOCET/ROXICET) 5-325 MG tablet Take 1 tablet by mouth every 8 (eight) hours as needed for up to 3 days for severe pain. 12/09/21 12/12/21 Yes Marcello Fennel, PA-C  amiodarone (PACERONE) 200 MG tablet TAKE 1 TABLET BY MOUTH ONCE DAILY 08/04/21   Richardson Dopp T, PA-C  cetirizine (ZYRTEC) 10 MG tablet TAKE 1 TABLET BY MOUTH ONCE DAILY FOR ALLERGIES. 10/31/21   [name], NP  ferrous sulfate 324 MG TBEC Take 324 mg by mouth at bedtime.    [provider]  furosemide (LASIX) 40 MG tablet Take 40 mg by mouth in the morning. 07/30/20   [provider]  levothyroxine (SYNTHROID) 100 MCG tablet TAKE 1 TABLET BY MOUTH ON EMPTY STOMACH WITH WATER ONLY ON SUNDAY THROUGH FRIDAY. TAKE 1 & 1/2 TABS SATURDAY ONLY.NO FOOD OR MEDS FOR 30  MINUTES AFTER. 10/09/21   [name], NP  LINZESS 145 MCG CAPS capsule TAKE ONE CAPSULE BY MOUTH EVERY OTHER DAY FOR CONSTIPATION 08/02/21   [name], NP  potassium chloride (KLOR-CON) 10 MEQ tablet Take 1 tablet (10 mEq total) by mouth daily. 08/04/21   Richardson Dopp T, PA-C  Rivaroxaban (XARELTO) 15 MG TABS tablet Take 1 tablet (15 mg total) by mouth daily with supper. Office visit required for further refills. 10/31/21   [name], NP  rosuvastatin (CRESTOR) 10 MG tablet TAKE ONE TABLET BY MOUTH IN THE EVENING for cholesterol. Office visit required for further refills. 10/31/21   [name], NP  vitamin B-12 (CYANOCOBALAMIN) 1000 MCG tablet TAKE 1 TABLET BY MOUTH ONCE DAILY 05/13/20   Earlie Server, MD      Allergies    Patient has no known allergies.    Review of Systems   Review of Systems  Constitutional:  Negative for chills and fever.  Respiratory:  Negative for shortness of breath.   Cardiovascular:  Negative for chest pain.  Gastrointestinal:  Negative for abdominal pain.  Musculoskeletal:        Left shoulder pain  Neurological:  Negative for headaches.    Physical Exam Updated Vital Signs BP (!) 159/85   Pulse 85   Temp 98.7 F (37.1 C) (Oral)   Resp 11   SpO2 96%  Physical  Exam Vitals and nursing note reviewed.  Constitutional:      General: He is not in acute distress.    Appearance: He is not ill-appearing.  HENT:     Head: Normocephalic and atraumatic.     Comments: No deform the head present no raccoon eyes or battle sign noted, he has a Band-Aid on his forehead, he recently had skin lesions removed, sutures are in place no evidence of infection noted.    Nose: No congestion.     Mouth/Throat:     Mouth: Mucous membranes are moist.     Pharynx: Oropharynx is clear.     Comments: No trismus no torticollis no oral trauma noted. Eyes:     Extraocular Movements: Extraocular movements intact.     Conjunctiva/sclera: Conjunctivae normal.      Pupils: Pupils are equal, round, and reactive to light.  Cardiovascular:     Rate and Rhythm: Normal rate and regular rhythm.     Pulses: Normal pulses.     Heart sounds: No murmur heard.    No friction rub. No gallop.  Pulmonary:     Effort: No respiratory distress.     Breath sounds: No wheezing, rhonchi or rales.  Abdominal:     Tenderness: There is no abdominal tenderness. There is no right CVA tenderness or left CVA tenderness.  Musculoskeletal:     Cervical back: No tenderness.     Comments: Spine was palpated was nontender to palpation no step-off or deformities noted, no pelvis instability no leg shortening.  Patient had noted deformity of his left shoulder, he significantly tender around the left anterior aspect the deltoid, he has full range of motion at his left elbow wrist and fingers, 2+ radial pulses bilaterally, he is moving his lower extremities out difficulty.  2+ dorsal pedal pulses.  Skin:    General: Skin is warm and dry.  Neurological:     Mental Status: He is alert.     Comments: No facial asymmetry no difficulty with word finding following two-step commands no unilateral weakness present.  Psychiatric:        Mood and Affect: Mood normal.     ED Results / Procedures / Treatments   Labs (all labs ordered are listed, but only abnormal results are displayed) Labs Reviewed  BASIC METABOLIC PANEL - Abnormal; Notable for the following components:      Result Value   Glucose, Bld 121 (*)    Creatinine, Ser 1.85 (*)    GFR, Estimated 34 (*)    All other components within normal limits  CBC WITH DIFFERENTIAL/PLATELET - Abnormal; Notable for the following components:   MCV 101.7 (*)    All other components within normal limits  PROTIME-INR - Abnormal; Notable for the following components:   Prothrombin Time 29.4 (*)    INR 2.8 (*)    All other components within normal limits  URINALYSIS, ROUTINE W REFLEX MICROSCOPIC  TYPE AND SCREEN     EKG None  Radiology CT Head Wo Contrast  Result Date: 12/09/2021 CLINICAL DATA:  Fall, head and neck trauma. EXAM: CT HEAD WITHOUT CONTRAST CT CERVICAL SPINE WITHOUT CONTRAST TECHNIQUE: Multidetector CT imaging of the head and cervical spine was performed following the standard protocol without intravenous contrast. Multiplanar CT image reconstructions of the cervical spine were also generated. RADIATION DOSE REDUCTION: This exam was performed according to the departmental dose-optimization program which includes automated exposure control, adjustment of the mA and/or kV according to patient size and/or use  of iterative reconstruction technique. COMPARISON:  09/20/2020. FINDINGS: CT HEAD FINDINGS Brain: No acute intracranial hemorrhage, midline shift or mass effect. No extra-axial fluid collection. Diffuse atrophy is noted. 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 Periventricular white matter hypodensities are noted bilaterally. Old infarcts are noted in the cerebellar hemispheres bilaterally. No hydrocephalus. Vascular: No hyperdense vessel or unexpected calcification. Skull: Normal. Negative for fracture or focal lesion. Sinuses/Orbits: Mucosal thickening is present in the left maxillary sinus and ethmoid air cells bilaterally. No acute orbital abnormality. Other: Scalp hematoma is present over the parietal lobe on the left. A 7 mm linear density is noted in this region. CT CERVICAL SPINE FINDINGS Alignment: Mild anterolisthesis at C3-C4 at C4-C5 Skull base and vertebrae: No acute fracture. No primary bone lesion or focal pathologic process. Soft tissues and spinal canal: No prevertebral fluid or swelling. No visible canal hematoma. Disc levels: Multilevel intervertebral disc space narrowing, uncovertebral osteophyte formation and facet arthropathy. Upper chest: Apical pleural scarring is present bilaterally. Other: Aortic atherosclerosis. Atherosclerotic calcification of the  carotid arteries. IMPRESSION: 1. No acute intracranial hemorrhage. 2. Scalp hematoma over the parietal bone on the left. A small linear density is seen in the region of the hematoma. Correlate clinically to exclude foreign body. 3. Atrophy with chronic microvascular ischemic changes and old infarcts. 4. Multilevel degenerative changes in the cervical spine without evidence acute fracture. Electronically Signed   By: Brett Fairy M.D.   On: 12/09/2021 03:04   CT Cervical Spine Wo Contrast  Result Date: 12/09/2021 CLINICAL DATA:  Fall, head and neck trauma. EXAM: CT HEAD WITHOUT CONTRAST CT CERVICAL SPINE WITHOUT CONTRAST TECHNIQUE: Multidetector CT imaging of the head and cervical spine was performed following the standard protocol without intravenous contrast. Multiplanar CT image reconstructions of the cervical spine were also generated. RADIATION DOSE REDUCTION: This exam was performed according to the departmental dose-optimization program which includes automated exposure control, adjustment of the mA and/or kV according to patient size and/or use of iterative reconstruction technique. COMPARISON:  09/20/2020. FINDINGS: CT HEAD FINDINGS Brain: No acute intracranial hemorrhage, midline shift or mass effect. No extra-axial fluid collection. Diffuse atrophy is noted. 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 Periventricular white matter hypodensities are noted bilaterally. Old infarcts are noted in the cerebellar hemispheres bilaterally. No hydrocephalus. Vascular: No hyperdense vessel or unexpected calcification. Skull: Normal. Negative for fracture or focal lesion. Sinuses/Orbits: Mucosal thickening is present in the left maxillary sinus and ethmoid air cells bilaterally. No acute orbital abnormality. Other: Scalp hematoma is present over the parietal lobe on the left. A 7 mm linear density is noted in this region. CT CERVICAL SPINE FINDINGS Alignment: Mild anterolisthesis at C3-C4 at  C4-C5 Skull base and vertebrae: No acute fracture. No primary bone lesion or focal pathologic process. Soft tissues and spinal canal: No prevertebral fluid or swelling. No visible canal hematoma. Disc levels: Multilevel intervertebral disc space narrowing, uncovertebral osteophyte formation and facet arthropathy. Upper chest: Apical pleural scarring is present bilaterally. Other: Aortic atherosclerosis. Atherosclerotic calcification of the carotid arteries. IMPRESSION: 1. No acute intracranial hemorrhage. 2. Scalp hematoma over the parietal bone on the left. A small linear density is seen in the region of the hematoma. Correlate clinically to exclude foreign body. 3. Atrophy with chronic microvascular ischemic changes and old infarcts. 4. Multilevel degenerative changes in the cervical spine without evidence acute fracture. Electronically Signed   By: Brett Fairy M.D.   On: 12/09/2021  03:04   DG Humerus Left  Result Date: 12/09/2021 CLINICAL DATA:  Recent fall with left shoulder pain, initial encounter EXAM: LEFT HUMERUS - 2+ VIEW COMPARISON:  None Available. FINDINGS: Comminuted acromial fracture is again identified. Humeral head is well seated. No other focal abnormality is noted. IMPRESSION: Comminuted acromial fracture. Electronically Signed   By: Inez Catalina M.D.   On: 12/09/2021 01:38   DG Shoulder Left  Result Date: 12/09/2021 CLINICAL DATA:  Recent fall with left shoulder pain, initial encounter EXAM: LEFT SHOULDER - 2+ VIEW COMPARISON:  None Available. FINDINGS: Degenerative changes of the acromioclavicular joint are noted. Comminuted fracture of the acromion is seen with mild displacement of the fracture fragments. Underlying bony thorax appears within normal limits. Humeral head is well seated. No other focal abnormality is noted. IMPRESSION: Comminuted fracture of the acromion. Electronically Signed   By: Inez Catalina M.D.   On: 12/09/2021 01:37   DG Chest Portable 1 View  Result Date:  12/09/2021 CLINICAL DATA:  Recent fall with chest pain, initial encounter EXAM: PORTABLE CHEST 1 VIEW COMPARISON:  05/17/2018 FINDINGS: Cardiac shadow is stable. Postsurgical changes are again seen. Spinal stimulators are again noted. Lungs are well aerated bilaterally. No focal infiltrate effusion is seen. No acute bony abnormality is noted. IMPRESSION: No active disease. Electronically Signed   By: Inez Catalina M.D.   On: 12/09/2021 01:36   DG Pelvis Portable  Result Date: 12/09/2021 CLINICAL DATA:  Pelvic pain following fall, initial encounter EXAM: PORTABLE PELVIS 1-2 VIEWS COMPARISON:  05/01/2008 FINDINGS: Right hip replacement is again noted and stable. Postsurgical changes in the lumbar spine are seen. Pelvic ring is intact. No acute fracture or dislocation is seen. No soft tissue changes are noted. IMPRESSION: No acute abnormality noted. Electronically Signed   By: Inez Catalina M.D.   On: 12/09/2021 01:36    Procedures .Critical Care  Performed by: Marcello Fennel, PA-C Authorized by: Marcello Fennel, PA-C   Critical care provider statement:    Critical care time (minutes):  30   Critical care time was exclusive of:  Separately billable procedures and treating other patients   Critical care was necessary to treat or prevent imminent or life-threatening deterioration of the following conditions:  Trauma   Critical care was time spent personally by me on the following activities:  Discussions with consultants, evaluation of patient's response to treatment, examination of patient, ordering and review of laboratory studies, ordering and review of radiographic studies, ordering and performing treatments and interventions, pulse oximetry, re-evaluation of patient's condition and review of old charts   I assumed direction of critical care for this patient from another provider in my specialty: no       Medications Ordered in ED Medications  HYDROmorphone (DILAUDID) injection 1 mg (1 mg  Intravenous Given 12/09/21 0111)  fentaNYL (SUBLIMAZE) injection 50 mcg (50 mcg Intravenous Given 12/09/21 0331)    ED Course/ Medical Decision Making/ A&P                           Medical Decision Making Amount and/or Complexity of Data Reviewed Labs: ordered. Radiology: ordered.  Risk Prescription drug management.   This patient presents to the ED for concern of fall, this involves an extensive number of treatment options, and is a complaint that carries with it a high risk of complications and morbidity.  The differential diagnosis includes cranial head bleed, intrathoracic/anterior abdominal trauma, orthopedic injuries.  Additional history obtained:  Additional history obtained from son at bedside External records from outside source obtained and reviewed including cardiology notes   Co morbidities that complicate the patient evaluation  On anticoag's  Social Determinants of Health:  Geriatric    Lab Tests:  I Ordered, and personally interpreted labs.  The pertinent results include: CBC unremarkable, BMP shows glucose of 121 creatinine 1.85 GFR 34   Imaging Studies ordered:  I ordered imaging studies including CT head, CT C-spine, x-ray of the chest, pelvis, left shoulder left humerus I independently visualized and interpreted imaging which showed chest and pelvic x-rays were unremarkable, humerus x-ray was unremarkable, left shoulder reveals fracture of acromion.  CT head and C-spine both negative acute findings I agree with the radiologist interpretation   Cardiac Monitoring:  The patient was maintained on a cardiac monitor.  I personally viewed and interpreted the cardiac monitored which showed an underlying rhythm of: Without signs of ischemia   Medicines ordered and prescription drug management:  I ordered medication including Dilaudid for pain I have reviewed the patients home medicines and have made adjustments as needed  Critical  Interventions:  Presents as a level 2 trauma, trauma work-up has been ordered.   Reevaluation:  Patient is reassessed after pain medications she states she is feeling much better, he has a isolated acromion.  Will place patient in a sling.  Patient is reassessed still resting comfortably, sling is in place, was able to ambulate with a walker, was able to get himself out of bed with minimal assistance.  Both patient and son are in agreement with plan discharge at this time.    Consultations Obtained:  I requested consultation with the spoke with orthopedic surgery Dr. Lucia Gaskins,  and discussed lab and imaging findings as well as pertinent plan - they recommend: Does not feel patient needs further imaging at this time, recommends a sling, follow-up in 3 weeks time.    Test Considered:  CT chest abdomen pelvis-as my suspicion for intrathoracic/intra-abdominal trauma is low at this time no evidence of trauma present my exam, both chest and abdomen are nontender during palpation.    Rule out low suspicion for intracranial head bleed as patient denies loss of conscious, is not on anticoagulant, she does not endorse headaches, paresthesia/weakness in the upper and lower extremities, no focal deficits present on my exam CT head negative acute findings.  Low suspicion for spinal cord abnormality or spinal fracture spine was palpated was nontender to palpation, patient has full range of motion in the upper and lower extremities.  CT C-spine negative acute findings low suspicion for pneumothorax as lung sounds are clear bilaterally   Dispostion and problem list  After consideration of the diagnostic results and the patients response to treatment, I feel that the patent would benefit from discharge.   Acromion fracture-patient was placed in a sling, will recommend weightbearing as tolerated, symptom management at home, follow-up with orthopedics for further evaluation 3 weeks  time Unsteadiness-patient is able to ambulate with a walker, he peers be slightly weak, will have a urgent referral for home health for further evaluation.             Final Clinical Impression(s) / ED Diagnoses Final diagnoses:  Fall, initial encounter  Closed nondisplaced fracture of acromial process of left scapula, initial encounter    Rx / DC Orders ED Discharge Orders          Ordered    oxyCODONE-acetaminophen (PERCOCET/ROXICET) 5-325 MG tablet  Every 8 hours PRN        12/09/21 0428    Ambulatory referral to Home Health       Comments: Please evaluate [facility] for admission to [place].  Disciplines requested: Physical Therapy, Occupational Therapy, Medical Social Work, and [place] to provide: Strengthening Exercises and Evaluate  Physician to follow patient's care (the person listed here will be responsible for signing ongoing orders): PCP  Requested Start of Care Date: Today (Please call ahead to confirm availability)  I certify that this patient is under my care and that I, or a Nurse Practitioner or Physician's Assistant working with me, had a face-to-face encounter that meets the physician face-to-face requirements with patient on 12/09/2021. The encounter with the patient was in whole, or in part for the following medical condition(s) which is the primary reason for home health care (List medical condition).  Patient has difficulty with gait due to recent acromion fracture has increase her fall risk.  Special Instructions:   12/09/21 0432              Marcello Fennel, PA-C 12/09/21 6122    Merryl Hacker, MD 12/09/21 0540    Marcello Fennel, PA-C 12/09/21 4497    Merryl Hacker, MD 12/13/21 [phone]

## 2021-12-09 NOTE — ED Notes (Signed)
Patient verbalizes understanding of discharge instructions. Opportunity for questioning and answers were provided. Armband removed by staff, pt discharged from ED via wheelchair to lobby to go home with family.

## 2021-12-09 NOTE — ED Notes (Signed)
This tech assisted with ambulating pt in room, pt was able to take a few steps around the room with a walker and limited assistance from myself.

## 2021-12-09 NOTE — ED Notes (Signed)
Ortho tech called for sling

## 2021-12-09 NOTE — Progress Notes (Signed)
Trauma Response Nurse Documentation   Leonard Majid Musgrave Sr. is a 88 y.Leonard. male arriving to [facility] ED via EMS  On Xarelto (rivaroxaban) daily. Trauma was activated as a Level 2 by ED charge RN based on the following trauma criteria Elderly patients > 65 with head trauma on anti-coagulation (excluding ASA). Trauma team at the bedside on patient arrival.   Patient cleared for CT by Dr. Dina Rich EDP. Pt transported to CT with ED primary nurse present to monitor. RN remained with the patient throughout their absence from the department for clinical observation.   Delay in arrival to CT approx 2 hours - primary nurse states they were unaware of level 2 activation. I was unable to assist with this d/t a level one trauma in the department at the same time.  GCS 15.  History   Past Medical History:  Diagnosis Date   Anemia    Anxiety    Arrhythmia    PAROXYSMAL ATRIAL FIBRILLATION   Arthritis    OSTEOARTHRITIS   Cataracts, bilateral    Chronic back pain    Coronary artery disease 8/11   CABG...LM EMERGENT WITH IABP sees Dr. Legrand Como cooper   DJD (degenerative joint disease)    DVT (deep venous thrombosis) (HCC)    Dyspnea    GERD (gastroesophageal reflux disease)    History of BPH    Hyperlipidemia    Hypertension    Hypothyroidism    Iron deficiency anemia due to chronic blood loss 06/29/2019   Lumbar post-laminectomy syndrome    Myocardial infarction [facility])    Pulmonary embolism ([place]) 2009   hx of   RBBB (right bundle branch block)    Renal artery stenosis (HCC)    Thrombocytopenia ([place])    Thyroid disease    HYPOTHYROIDISM   Type 2 diabetes mellitus without complication, without long-term current use of insulin ([place]) 07/28/2019   Unstable angina ([place])    NONE IN LONG TIME     Past Surgical History:  Procedure Laterality Date   Blood clot  Feb.20,  2016   Pulmonary Embolism   CARDIOVERSION N/A 03/15/2019   Procedure: CARDIOVERSION;  Surgeon: Pixie Casino, MD;   Location: [place];  Service: Cardiovascular;  Laterality: N/A;   CAROTID ENDARTERECTOMY Right Oct. 17, 2013   CE   CAROTID ENDARTERECTOMY Left Dec. 3, 2016   CE   CATARACT EXTRACTION  05/2014,06/2014   COLONOSCOPY WITH PROPOFOL N/A 05/20/2019   Procedure: COLONOSCOPY WITH PROPOFOL;  Surgeon: [name]., MD;  Location: [facility] ENDOSCOPY;  Service: Gastroenterology;  Laterality: N/A;   CORONARY ARTERY BYPASS GRAFT  11-21-09   EMERGENT X 3 GRAFTING. ..[name], MD, cc: [name]   ENDARTERECTOMY  01/28/2012   Procedure: ENDARTERECTOMY CAROTID;  Surgeon: [name], MD;  Location: [place];  Service: Vascular;  Laterality: Right;  with Primary Closure of Artery   ENDARTERECTOMY  03/15/2012   Procedure: ENDARTERECTOMY CAROTID;  Surgeon: [name], MD;  Location: [place];  Service: Vascular;  Laterality: Left;   ENTEROSCOPY N/A 05/23/2019   Procedure: ENTEROSCOPY;  Surgeon: [name], MD;  Location: [place];  Service: Endoscopy;  Laterality: N/A;   ESOPHAGOGASTRODUODENOSCOPY N/A 05/20/2019   Procedure: ESOPHAGOGASTRODUODENOSCOPY (EGD);  Surgeon: [name]., MD;  Location: [place];  Service: Gastroenterology;  Laterality: N/A;   ESOPHAGOGASTRODUODENOSCOPY (EGD) WITH PROPOFOL N/A 05/19/2019   Procedure: ESOPHAGOGASTRODUODENOSCOPY (EGD) WITH PROPOFOL;  Surgeon: [name], MD;  Location: [place];  Service: Endoscopy;  Laterality: N/A;  FOOT SURGERY     GIVENS CAPSULE STUDY N/A 05/20/2019   Procedure: GIVENS CAPSULE STUDY;  Surgeon: [name]., MD;  Location: [place];  Service: Gastroenterology;  Laterality: N/A;   HOT HEMOSTASIS N/A 05/23/2019   Procedure: HOT HEMOSTASIS (ARGON PLASMA COAGULATION/BICAP);  Surgeon: [name], MD;  Location: [place];  Service: Endoscopy;  Laterality: N/A;   MANDIBLE SURGERY     MAXIMUM ACCESS (MAS)POSTERIOR LUMBAR INTERBODY FUSION (PLIF) 2 LEVEL N/A 08/08/2015   Procedure:  Lumbar Four-Five, Lumbar Five-Sacral One Maximum access posterior lumbar interbody fusion;  Surgeon: [name], MD;  Location: [place] NEURO ORS;  Service: Neurosurgery;  Laterality: N/A;  LUMBAR FOUR-FIVE ,LUMBAR FIVE -SACRAL Maximum access posterior lumbar interbody fusion   POLYPECTOMY  05/20/2019   Procedure: POLYPECTOMY;  Surgeon: [name], Telford Nab., MD;  Location: [place];  Service: Gastroenterology;;   RENAL ARTERY STENT  2010   REPLACEMENT TOTAL KNEE  2006   RIGHT   SPINAL CORD STIMULATOR INSERTION N/A 06/18/2017   Procedure: LUMBAR SPINAL CORD STIMULATOR INSERTION;  Surgeon: [name], MD;  Location: [place];  Service: Neurosurgery;  Laterality: N/A;  LUMBAR SPINAL CORD STIMULATOR INSERTION   TOTAL HIP ARTHROPLASTY     right       Initial Focused Assessment (If applicable, or please see trauma documentation): See trauma narrator assessment  CT's Completed:   CT Head and CT C-Spine   Interventions:  XRAY chest, pelvis, left shoulder and humerus CT head and cervical  Trauma lab draw EKG Referral to home health  Plan for disposition:  Discharge home   Consults completed:  none   Event Summary: Patient arrives to ED after a fall resulting in left shoulder/elbow pain. Posterior head injury. On xarelto. I was unable to attend the initial trauma resus d/t level 1 trauma in department at the same time. Saw patient about two hours after arrival, assisted in scooting up in bed.   MTP Summary (If applicable): NA   Leonard Berg Leonard Berg  Trauma Response RN  Please call TRN at [phone] for further assistance.

## 2021-12-09 NOTE — Progress Notes (Signed)
    Chronic Care Management Pharmacy Assistant   Name: Shayaan Parke [facility] Sr.  MRN: [id number] DOB: September 29, 1929  Reason for Encounter: CCM (Appointment Reminder)  Medications: Outpatient Encounter Medications as of 12/09/2021  Medication Sig   amiodarone (PACERONE) 200 MG tablet TAKE 1 TABLET BY MOUTH ONCE DAILY   cetirizine (ZYRTEC) 10 MG tablet TAKE 1 TABLET BY MOUTH ONCE DAILY FOR ALLERGIES.   ferrous sulfate 324 MG TBEC Take 324 mg by mouth at bedtime.   furosemide (LASIX) 40 MG tablet Take 40 mg by mouth in the morning.   levothyroxine (SYNTHROID) 100 MCG tablet TAKE 1 TABLET BY MOUTH ON EMPTY STOMACH WITH WATER ONLY ON SUNDAY THROUGH FRIDAY. TAKE 1 & 1/2 TABS SATURDAY ONLY.NO FOOD OR MEDS FOR 30 MINUTES AFTER.   LINZESS 145 MCG CAPS capsule TAKE ONE CAPSULE BY MOUTH EVERY OTHER DAY FOR CONSTIPATION   oxyCODONE-acetaminophen (PERCOCET/ROXICET) 5-325 MG tablet Take 1 tablet by mouth every 8 (eight) hours as needed for up to 3 days for severe pain.   potassium chloride (KLOR-CON) 10 MEQ tablet Take 1 tablet (10 mEq total) by mouth daily.   Rivaroxaban (XARELTO) 15 MG TABS tablet Take 1 tablet (15 mg total) by mouth daily with supper. Office visit required for further refills.   rosuvastatin (CRESTOR) 10 MG tablet TAKE ONE TABLET BY MOUTH IN THE EVENING for cholesterol. Office visit required for further refills.   vitamin B-12 (CYANOCOBALAMIN) 1000 MCG tablet TAKE 1 TABLET BY MOUTH ONCE DAILY   No facility-administered encounter medications on file as of 12/09/2021.   Arty Lantzy [facility] Sr. was contacted to remind of upcoming telephone visit with Charlene Brooke on 12/12/2021 at 11:00. Patient was reminded to have any blood glucose and blood pressure readings available for review at appointment.   Message was left reminding patient of appointment.  CCM referral has been placed prior to visit?  No   Star Rating Drugs: Medication:  Last Fill: Day Supply Rosuvastatin 10  mg 10/31/21 [place], CPP notified  Marijean Niemann, [place] Pharmacy Assistant [phone]

## 2021-12-10 DIAGNOSIS — L905 Scar conditions and fibrosis of skin: Secondary | ICD-10-CM | POA: Diagnosis not present

## 2021-12-10 DIAGNOSIS — D0439 Carcinoma in situ of skin of other parts of face: Secondary | ICD-10-CM | POA: Diagnosis not present

## 2021-12-10 DIAGNOSIS — S42102D Fracture of unspecified part of scapula, left shoulder, subsequent encounter for fracture with routine healing: Secondary | ICD-10-CM

## 2021-12-11 NOTE — Telephone Encounter (Signed)
Left message to return call to our office.  

## 2021-12-11 NOTE — Telephone Encounter (Signed)
Called son to get more information but he is at dentist with his daughter. Asked for return call later this morning.

## 2021-12-12 ENCOUNTER — Ambulatory Visit: Payer: Medicare Other | Admitting: Pharmacist

## 2021-12-12 DIAGNOSIS — I4891 Unspecified atrial fibrillation: Secondary | ICD-10-CM

## 2021-12-12 DIAGNOSIS — I10 Essential (primary) hypertension: Secondary | ICD-10-CM

## 2021-12-12 DIAGNOSIS — I2510 Atherosclerotic heart disease of native coronary artery without angina pectoris: Secondary | ICD-10-CM

## 2021-12-12 DIAGNOSIS — E785 Hyperlipidemia, unspecified: Secondary | ICD-10-CM

## 2021-12-12 NOTE — Patient Instructions (Signed)
Visit Information  Phone number for Pharmacist: [phone]   Goals Addressed   None     Care Plan : [place]  Updates made by Charlton Haws, [facility] since 12/12/2021 12:00 AM     Problem: Hypertension, Hyperlipidemia, Diabetes, Atrial Fibrillation, Heart Failure, Coronary Artery Disease, Chronic Kidney Disease, Hypothyroidism, and BPH, cough, sleep troubles   Priority: High     Long-Range Goal: Disease management   Start Date: 12/13/2020  Expected End Date: 12/13/2022  This Visit's Progress: On track  Recent Progress: On track  Priority: High  Note:   Current Barriers:  Fall risk  Pharmacist Clinical Goal(s):  Patient will contact provider office for questions/concerns as evidenced notation of same in electronic health record through collaboration with PharmD and provider.   Interventions: 1:1 collaboration with [name], NP regarding development and update of comprehensive plan of care as evidenced by provider attestation and co-signature Inter-disciplinary care team collaboration (see longitudinal plan of care) Comprehensive medication review performed; medication list updated in electronic medical record  Heart Failure / HTN (Goal: BP goal < 130/80) -Controlled - pt reports compliance with furosemide and denies swelling -Current home BP: n/a -Last ejection fraction: 60-65% (Date: 06/26/20) -HF type: Diastolic -Current treatment: Furosemide 40 mg daily - Appropriate, Effective, Safe, Accessible -Current dietary habits: prepares meals for himself - sandwiches, vegetables, potatoes -Current exercise habits: limited - recent hip fracture (June 2022) -Educated on Importance of blood pressure control -Recommended to continue current medication  Hyperlipidemia / CAD (LDL goal < 70) -Controlled - LDL70 (07/2019); pt endorses compliance with statin and denies issues -Hx CABG 2011. No aspirin due to Xarelto. -Current treatment: Rosuvastatin 10 mg daily  - Appropriate, Effective, Safe, Accessible -Educated on Cholesterol goals;  -Recommended to continue current medication  Diabetes (A1c goal <7%) -Diet-controlled - A1c 5.8% (10/2020) -Medications previously tried: none  -Educated on A1c and blood sugar goals; -Counseled on diet and exercise extensively  Atrial Fibrillation (Goal: prevent stroke and major bleeding) -Controlled - pt endorses compliance with medications; pt denies further bleeding issues since initial GI bleed -Hx GI bleed early 2021 requiring transfusion -CHADSVASC: 5 -Current treatment: Amiodarone 200 mg daily - Appropriate, Effective, Safe, Accessible Xarelto 15 mg daily -Appropriate, Effective, Safe, Accessible -Counseled on importance of adherence to anticoagulant exactly as prescribed; bleeding risk associated with Xarelto and importance of self-monitoring for signs/symptoms of bleeding; avoidance of NSAIDs due to increased bleeding risk with anticoagulants; -Recommended to continue current medication  Hypothyroidism (Goal: maintain TSH in goal range) -Controlled - pt sometimes misses doses due to sleep scheduling confusing AM/PM, but TSH is recently at goal -Current treatment  Levothyroxine 100 mcg - 1 tab daily except 1.5 tab on Sat -Appropriate, Effective, Safe, Accessible -Counseled on long 1/2 life of levothyroxine, small infrequent errors in timing/dose are unlikely to drastically alter TSH -Recommended to continue current medication  Constipation (Goal: manage symptoms) -Controlled - pt is not needing Linzess daily, bowel movements under control with every 2-3 day dosing; he is not requiring other OTC remedies (ie Miralax) -Current treatment  Linzess 145 mcg QOD -Appropriate, Effective, Safe, Accessible -Recommended to continue current medication  Health Maintenance -Vaccine gaps: Shingrix, Prevnar, covid booster, flu -pt declines additional vaccines -Fall risk: pt recently in ED s/p mechanical fall, with  shoulder fracture. Discharged home in sling. He uses walker to get around, which is difficult one-handed. Son is concerned for another fall as pt lives alone. Home health referral was placed  in ED  but they have not heard anything. He was also told to follow up with orthopedics within 2 weeks but  has not heard from them either - per chart no ortho referral was ordered, pt last saw [employer] in 2019 so would likely need new referral -Plan: will Coordinate with primary team to follow up on home health referral and orthopedic referral  Patient Goals/Self-Care Activities Patient will:  - take medications as prescribed focus on medication adherence by pill box check blood pressure 2-3 times weekly, document, and provide at future appointments       Patient verbalizes understanding of instructions and care plan provided today and agrees to view in [place]. Active MyChart status and patient understanding of how to access instructions and care plan via MyChart confirmed with patient.    Telephone follow up appointment with pharmacy team member scheduled for: 1 month  Charlene Brooke, PharmD, [facility] Clinical Pharmacist [place] [facility] [phone]

## 2021-12-12 NOTE — Telephone Encounter (Signed)
Ortho referral placed.   Let me know if anything is needed for [facility]. He may need an appointment if new referral is needed.

## 2021-12-12 NOTE — Telephone Encounter (Signed)
I have called don if he has not heard from new referrals by Tuesday he will let me know so I can follow up for him.

## 2021-12-12 NOTE — Progress Notes (Signed)
Chronic Care Management Pharmacy Note  12/12/2021 Name:  Leonard Haskew [facility] MRN:  [id number] DOB:  1929/10/02  Summary: CCM F/U Visit -Reviewed medications; affirms compliance as prescribed, denies issues -Pt had a mechanical fall 8/29 and went to ED, his shoulder is in a sling and uses a walker which is difficult one-handed. Home Health referral was placed in ED but pt has not heard from them. He was also told to follow up with orthopedics within 2 weeks but  has not heard from them either - per chart no ortho referral was ordered, pt last saw [facility] [employer] [facility] in 2019 so would likely need new referral  Recommendations/Changes made from today's visit: -Coordinate with primary team to follow up on home health referral and orthopedic referral - message sent to [facility]  Follow up Plan: -[place] will call patient 3-4 weeks for general update -PCP f/u 12/29/21     Subjective: Leonard Brace Sr. is an 87 y.o. year old male who is a primary patient of [name], NP.  The CCM team was consulted for assistance with disease management and care coordination needs.    Engaged with patient by telephone for follow up visit in response to provider referral for pharmacy case management and/or care coordination services.   Consent to Services:  The patient was given information about Chronic Care Management services, agreed to services, and gave verbal consent prior to initiation of services.  Please see initial visit note for detailed documentation.   Patient Care Team: [name], NP as PCP - General (Internal Medicine) [name], MD as PCP - Cardiology (Cardiology) Earlie Server, MD as Consulting Physician (Hematology and Oncology) Jen Mow, [place] as Physician Assistant (Nephrology) Leonard Berg, [facility] as Pharmacist (Pharmacist)  Has caregiver comes 3 days a week for 4 hours.   Recent office visits: 04/24/21 NP Allie Bossier OV:  onychomycosis - referred to podiatry 11/07/20 Carlis Abbott (PCP)- Type 2 diabetes. Take linaclotide 145 mg EOD.   Recent consult visits: 08/01/21 PA Richardson Dopp (Cardiology): f/u HFimpEF. K+ is low, start potassium 10 meq once daily . 10/07/20 Dr [name] (ortho surgery): f/u hip fracture  06/05/20 Dr Burt Knack (cardiology): f/u CHF, CAD. Off loop diuretics. Ordered ECHO - showed normal LV function. No med changes.  Hospital visits: 12/09/21 ED visit [facility]): fall, down for 1 hour. Isolated acromion, discharged with sling. Referral for home health.   09/20/20 - 09/27/20 Admission [facility]): R hip fracture s/p fall. Non-operative approach.    Objective:  Lab Results  Component Value Date   CREATININE 1.85 (H) 12/09/2021   BUN 21 12/09/2021   GFR 49.83 (L) 04/19/2018   GFRNONAA 34 (L) 12/09/2021   GFRAA 48 07/27/2019   NA 138 12/09/2021   K 3.8 12/09/2021   CALCIUM 9.1 12/09/2021   CO2 23 12/09/2021   GLUCOSE 121 (H) 12/09/2021    Lab Results  Component Value Date/Time   HGBA1C 5.8 11/07/2020 09:58 AM   HGBA1C 5.9 (A) 03/11/2020 09:01 AM   HGBA1C 5.4 07/28/2019 08:44 AM   GFR 49.83 (L) 04/19/2018 10:38 AM   GFR 52.94 (L) 03/17/2017 12:01 PM    Last diabetic Eye exam: No results found for: "HMDIABEYEEXA"  Last diabetic Foot exam: No results found for: "HMDIABFOOTEX"   Lab Results  Component Value Date   CHOL 129 07/28/2019   HDL 40.50 07/28/2019   LDLCALC 70 07/28/2019   LDLDIRECT 100.8 08/12/2010   TRIG 93.0 07/28/2019   CHOLHDL 3 07/28/2019  Latest Ref Rng & Units 11/19/2021   12:00 AM 08/01/2021    9:56 AM 11/13/2020   12:00 AM  Hepatic Function  Total Protein 6.0 - 8.5 g/dL  5.9    Albumin 3.5 - 5.0 4.2     3.5  4.0      AST 0 - 40 IU/L  21    ALT 0 - 44 IU/L  14    Alk Phosphatase 44 - 121 IU/L  91    Total Bilirubin 0.0 - 1.2 mg/dL  0.5       This result is from an external source.    Lab Results  Component Value Date/Time   TSH 3.860 08/01/2021 09:56 AM   TSH 2.13  11/07/2020 09:58 AM   FREET4 1.12 11/23/2009 05:45 AM       Latest Ref Rng & Units 12/09/2021   12:49 AM 08/01/2021    9:56 AM 11/13/2020   12:00 AM  CBC  WBC 4.0 - 10.5 K/uL 9.9  6.0  6.2   Hemoglobin 13.0 - 17.0 g/dL 15.3  13.7  14.4   Hematocrit 39.0 - 52.0 % 47.5  41.8  43   Platelets 150 - 400 K/uL 173  154  169     Lab Results  Component Value Date/Time   VD25OH 21.7 11/13/2020 12:00 AM   VD25OH 24.4 01/31/2020 12:00 AM    Clinical ASCVD: Yes  The ASCVD Risk score (Arnett DK, et al., 2019) failed to calculate for the following reasons:   The 2019 ASCVD risk score is only valid for ages 55 to 8       04/22/2021    9:57 AM 11/07/2020    9:34 AM 10/11/2019   12:06 PM  Depression screen PHQ 2/9  Decreased Interest 0 0 0  Down, Depressed, Hopeless 1 0 0  PHQ - 2 Score 1 0 0  Altered sleeping   0  Tired, decreased energy   0  Change in appetite   0  Feeling bad or failure about yourself    0  Trouble concentrating   0  Moving slowly or fidgety/restless   0  Suicidal thoughts   0  PHQ-9 Score   0  Difficult doing work/chores   Not difficult at all    CHA2DS2-VASc Score = 7  The patient's score is based upon: CHF History: 1 HTN History: 1 Diabetes History: 0 Stroke History: 2 Vascular Disease History: 1 Age Score: 2 Gender Score: 0     Social History   Tobacco Use  Smoking Status Former   Types: Cigarettes   Quit date: 04/13/1974   Years since quitting: 47.6  Smokeless Tobacco Former   Types: Chew   Quit date: 01/26/2002  Tobacco Comments   He has smoked about 27-pack-year hx    BP Readings from Last 3 Encounters:  12/09/21 (!) 159/85  08/01/21 140/60  04/24/21 134/78   Pulse Readings from Last 3 Encounters:  12/09/21 85  08/01/21 73  04/24/21 79   Wt Readings from Last 3 Encounters:  08/01/21 225 lb 6.4 oz (102.2 kg)  04/24/21 226 lb (102.5 kg)  04/22/21 211 lb (95.7 kg)   BMI Readings from Last 3 Encounters:  08/01/21 28.94 kg/m  04/24/21  29.02 kg/m  04/22/21 27.09 kg/m    Assessment/Interventions: Review of patient past medical history, allergies, medications, health status, including review of consultants reports, laboratory and other test data, was performed as part of comprehensive evaluation and provision of chronic care  management services.   SDOH:  (Social Determinants of Health) assessments and interventions performed: No - done 04/2021  SDOH Screenings   Alcohol Screen: Low Risk  (04/22/2021)   Alcohol Screen    Last Alcohol Screening Score (AUDIT): 0  Depression (PHQ2-9): Low Risk  (04/22/2021)   Depression (PHQ2-9)    PHQ-2 Score: 1  Financial Resource Strain: Low Risk  (04/22/2021)   Overall Financial Resource Strain (CARDIA)    Difficulty of Paying Living Expenses: Not hard at all  Food Insecurity: No Food Insecurity (04/22/2021)   Hunger Vital Sign    Worried About Running Out of Food in the Last Year: Never true    [place] in the Last Year: Never true  Housing: Low Risk  (04/22/2021)   Housing    Last Housing Risk Score: 0  Physical Activity: Insufficiently Active (04/22/2021)   Exercise Vital Sign    Days of Exercise per Week: 7 days    Minutes of Exercise per Session: 20 min  Social Connections: Socially Isolated (04/22/2021)   Social Connection and Isolation Panel [NHANES]    Frequency of Communication with Friends and Family: Never    Frequency of Social Gatherings with Friends and Family: More than three times a week    Attends Religious Services: Never    Marine scientist or Organizations: No    Attends Archivist Meetings: Never    Marital Status: Widowed  Stress: No Stress Concern Present (04/22/2021)   [place]    Feeling of Stress : Only a little  Tobacco Use: Medium Risk (08/01/2021)   Patient History    Smoking Tobacco Use: Former    Smokeless Tobacco Use: Former    Passive Exposure: Not on Lexicographer Needs: No Transportation Needs (04/22/2021)   PRAPARE - Hydrologist (Medical): No    Lack of Transportation (Non-Medical): No    CCM Care Plan  No Known Allergies  Medications Reviewed Today     Reviewed by Leonard Berg, [facility] (Pharmacist) on 12/12/21 at 1013  Med List Status: <None>   Medication Order Taking? Sig Documenting Provider Last Dose Status Informant  amiodarone (PACERONE) 200 MG tablet [id number] Yes TAKE 1 TABLET BY MOUTH ONCE DAILY Richardson Dopp T, PA-C Taking Active   cetirizine (ZYRTEC) 10 MG tablet [id number] Yes TAKE 1 TABLET BY MOUTH ONCE DAILY FOR ALLERGIES. [name], NP Taking Active   ferrous sulfate 324 MG TBEC [id number] Yes Take 324 mg by mouth at bedtime. [provider] Taking Active Child  furosemide (LASIX) 40 MG tablet [id number] Yes Take 40 mg by mouth in the morning. [provider] Taking Active Child  levothyroxine (SYNTHROID) 100 MCG tablet [id number] Yes TAKE 1 TABLET BY MOUTH ON EMPTY STOMACH WITH WATER ONLY ON SUNDAY THROUGH FRIDAY. TAKE 1 & 1/2 TABS SATURDAY ONLY.NO FOOD OR MEDS FOR 30 MINUTES AFTER. [name], NP Taking Active   LINZESS 145 MCG CAPS capsule [id number] Yes TAKE ONE CAPSULE BY MOUTH EVERY OTHER DAY FOR CONSTIPATION [name], NP Taking Active   oxyCODONE-acetaminophen (PERCOCET/ROXICET) 5-325 MG tablet [id number] Yes Take 1 tablet by mouth every 8 (eight) hours as needed for up to 3 days for severe pain. Marcello Fennel, PA-C Taking Active   potassium chloride (KLOR-CON) 10 MEQ tablet [id number] Yes Take 1 tablet (10 mEq total) by mouth daily. Liliane Shi, PA-C Taking Active  Rivaroxaban (XARELTO) 15 MG TABS tablet [id number] Yes Take 1 tablet (15 mg total) by mouth daily with supper. Office visit required for further refills. [name], NP Taking Active   rosuvastatin (CRESTOR) 10 MG tablet [id number] Yes TAKE ONE TABLET BY MOUTH IN  THE EVENING for cholesterol. Office visit required for further refills. [name], NP Taking Active   vitamin B-12 (CYANOCOBALAMIN) 1000 MCG tablet [id number] Yes TAKE 1 TABLET BY MOUTH ONCE DAILY Earlie Server, MD Taking Active Child            Patient Active Problem List   Diagnosis Date Noted   HFimpEF (heart failure with improved EF) 07/31/2021   Onychomycosis 04/24/2021   Vitamin B 12 deficiency 11/07/2020   Closed fracture of greater trochanter of femur ([place]) 09/21/2020   Falls, initial encounter 09/21/2020   Dark brown urine 04/22/2020   Type 2 diabetes mellitus without complication, without long-term current use of insulin ([place]) 07/28/2019   Iron deficiency anemia due to chronic blood loss 06/29/2019   GIB (gastrointestinal bleeding) 05/18/2019   Constipation 04/19/2018   Medicare annual wellness visit, subsequent 11/19/2015   Spondylolisthesis of lumbar region 08/08/2015   Renal failure 05/23/2015   History of pulmonary embolism 06/03/2014   Carotid artery disease ([place]) 01/27/2012   Renal artery atherosclerosis ([place]) 07/16/2010   CAROTID BRUIT 02/11/2010   Mixed hyperlipidemia 12/17/2009   Hypothyroidism 12/12/2009   ANEMIA 12/12/2009   THROMBOCYTOPENIA 12/12/2009   Essential hypertension 12/12/2009   Coronary artery disease involving native coronary artery of native heart without angina pectoris 12/12/2009   RIGHT BUNDLE BRANCH BLOCK 12/12/2009   PAF (paroxysmal atrial fibrillation) ([place]) 12/12/2009   Osteoarthritis 12/12/2009   BPH (benign prostatic hyperplasia) 12/12/2009    Immunization History  Administered Date(s) Administered   Influenza Split 01/29/2012   Influenza, High Dose Seasonal PF 02/11/2021   Influenza-Unspecified 02/16/2017, 02/17/2018, 02/29/2020   PFIZER(Purple Top)SARS-COV-2 Vaccination 05/27/2019, 06/18/2019, 02/29/2020   Pneumococcal Polysaccharide-23 01/29/2012, 06/04/2014, 08/10/2015   Tdap 01/20/2018   Zoster, Live 02/18/2016     Conditions to be addressed/monitored:  Hypertension, Hyperlipidemia, Diabetes, Atrial Fibrillation, Heart Failure, Coronary Artery Disease, Chronic Kidney Disease, Hypothyroidism, and BPH, cough, sleep troubles  Care Plan : [place]  Updates made by Leonard Berg, [place] since 12/12/2021 12:00 AM     Problem: Hypertension, Hyperlipidemia, Diabetes, Atrial Fibrillation, Heart Failure, Coronary Artery Disease, Chronic Kidney Disease, Hypothyroidism, and BPH, cough, sleep troubles   Priority: High     Long-Range Goal: Disease management   Start Date: 12/13/2020  Expected End Date: 12/13/2022  This Visit's Progress: On track  Recent Progress: On track  Priority: High  Note:   Current Barriers:  Fall risk  Pharmacist Clinical Goal(s):  Patient will contact provider office for questions/concerns as evidenced notation of same in electronic health record through collaboration with PharmD and provider.   Interventions: 1:1 collaboration with [name], NP regarding development and update of comprehensive plan of care as evidenced by provider attestation and co-signature Inter-disciplinary care team collaboration (see longitudinal plan of care) Comprehensive medication review performed; medication list updated in electronic medical record  Heart Failure / HTN (Goal: BP goal < 130/80) -Controlled - pt reports compliance with furosemide and denies swelling -Current home BP: n/a -Last ejection fraction: 60-65% (Date: 06/26/20) -HF type: Diastolic -Current treatment: Furosemide 40 mg daily - Appropriate, Effective, Safe, Accessible -Current dietary habits: prepares meals for himself - sandwiches, vegetables, potatoes -Current exercise habits: limited - recent hip  fracture (June 2022) -Educated on Importance of blood pressure control -Recommended to continue current medication  Hyperlipidemia / CAD (LDL goal < 70) -Controlled - LDL70 (07/2019); pt endorses  compliance with statin and denies issues -Hx CABG 2011. No aspirin due to Xarelto. -Current treatment: Rosuvastatin 10 mg daily - Appropriate, Effective, Safe, Accessible -Educated on Cholesterol goals;  -Recommended to continue current medication  Diabetes (A1c goal <7%) -Diet-controlled - A1c 5.8% (10/2020) -Medications previously tried: none  -Educated on A1c and blood sugar goals; -Counseled on diet and exercise extensively  Atrial Fibrillation (Goal: prevent stroke and major bleeding) -Controlled - pt endorses compliance with medications; pt denies further bleeding issues since initial GI bleed -Hx GI bleed early 2021 requiring transfusion -CHADSVASC: 5 -Current treatment: Amiodarone 200 mg daily - Appropriate, Effective, Safe, Accessible Xarelto 15 mg daily -Appropriate, Effective, Safe, Accessible -Counseled on importance of adherence to anticoagulant exactly as prescribed; bleeding risk associated with Xarelto and importance of self-monitoring for signs/symptoms of bleeding; avoidance of NSAIDs due to increased bleeding risk with anticoagulants; -Recommended to continue current medication  Hypothyroidism (Goal: maintain TSH in goal range) -Controlled - pt sometimes misses doses due to sleep scheduling confusing AM/PM, but TSH is recently at goal -Current treatment  Levothyroxine 100 mcg - 1 tab daily except 1.5 tab on Sat -Appropriate, Effective, Safe, Accessible -Counseled on long 1/2 life of levothyroxine, small infrequent errors in timing/dose are unlikely to drastically alter TSH -Recommended to continue current medication  Constipation (Goal: manage symptoms) -Controlled - pt is not needing Linzess daily, bowel movements under control with every 2-3 day dosing; he is not requiring other OTC remedies (ie Miralax) -Current treatment  Linzess 145 mcg QOD -Appropriate, Effective, Safe, Accessible -Recommended to continue current medication  Health Maintenance -Vaccine  gaps: Shingrix, Prevnar, covid booster, flu -pt declines additional vaccines -Fall risk: pt recently in ED s/p mechanical fall, with shoulder fracture. Discharged home in sling. He uses walker to get around, which is difficult one-handed. Son is concerned for another fall as pt lives alone. Home health referral was placed  in ED but they have not heard anything. He was also told to follow up with orthopedics within 2 weeks but  has not heard from them either - per chart no ortho referral was ordered, pt last saw [facility] [employer] in 2019 so would likely need new referral -Plan: will Coordinate with primary team to follow up on home health referral and orthopedic referral  Patient Goals/Self-Care Activities Patient will:  - take medications as prescribed focus on medication adherence by pill box check blood pressure 2-3 times weekly, document, and provide at future appointments       Medication Assistance: None required.  Patient affirms current coverage meets needs.  Compliance/Adherence/Medication fill history: Care Gaps: Eye exam, Foot exam, Urine Microalbumin, A1c- A1c is in prediabetic range, no need for DM screenings  Star-Rating Drugs: Rosuvastatin - PDC 100%  Medication Access: Within the past 30 days, how often has patient missed a dose of medication? 0 Is a pillbox or other method used to improve adherence? No  Factors that may affect medication adherence? no barriers identified Are meds synced by current pharmacy? No  Are meds delivered by current pharmacy? No  Does patient experience delays in picking up medications due to transportation concerns? No   [facility] Reviewed: Is patient disadvantaged to use [facility]?: Yes  Current Rx insurance plan: [place] Name and location of Current pharmacy:  [place], [place]  [place] [address] [place] [address] Phone: [phone] Fax: [phone]  [facility]  services reviewed with patient today?: No  Patient requests to transfer care to [facility]?: No  Reason patient declined to change pharmacies: Disadvantaged due to insurance/mail order   Care Plan and Follow Up Patient Decision:  Patient agrees to Care Plan and Follow-up.  Plan: Telephone follow up appointment with care management team member scheduled for:  1 month  Charlene Brooke, PharmD, [place], CPP Clinical Pharmacist [facility] [phone]

## 2021-12-12 NOTE — Addendum Note (Signed)
Addended by: Waunita Schooner R on: 12/12/2021 02:26 PM   Modules accepted: Orders

## 2021-12-16 ENCOUNTER — Ambulatory Visit: Payer: Medicare Other | Admitting: Primary Care

## 2021-12-17 ENCOUNTER — Telehealth: Payer: Self-pay

## 2021-12-17 NOTE — Progress Notes (Signed)
Entered in Error

## 2021-12-22 ENCOUNTER — Ambulatory Visit: Payer: Medicare Other | Admitting: Podiatry

## 2021-12-22 ENCOUNTER — Encounter: Payer: Self-pay | Admitting: Podiatry

## 2021-12-22 DIAGNOSIS — E119 Type 2 diabetes mellitus without complications: Secondary | ICD-10-CM | POA: Diagnosis not present

## 2021-12-22 DIAGNOSIS — B351 Tinea unguium: Secondary | ICD-10-CM

## 2021-12-22 DIAGNOSIS — D689 Coagulation defect, unspecified: Secondary | ICD-10-CM

## 2021-12-22 DIAGNOSIS — M79675 Pain in left toe(s): Secondary | ICD-10-CM | POA: Diagnosis not present

## 2021-12-22 DIAGNOSIS — N189 Chronic kidney disease, unspecified: Secondary | ICD-10-CM

## 2021-12-22 DIAGNOSIS — M79674 Pain in right toe(s): Secondary | ICD-10-CM

## 2021-12-22 NOTE — Progress Notes (Signed)
This patient returns to my office for at risk foot care.  This patient requires this care by a professional since this patient will be at risk due to having type 2 diabetes,renal failure, coagulation defect and thrombocytopenia.  Patient is taking xarelto. This patient is unable to cut nails himself since the patient cannot reach his nails.These nails are painful walking and wearing shoes.  This patient presents for at risk foot care today.  General Appearance  Alert, conversant and in no acute stress.  Vascular  Dorsalis pedis and posterior tibial  pulses are  weakly palpable  bilaterally.  Capillary return is within normal limits  bilaterally. Temperature is within normal limits  bilaterally.  Neurologic  Senn-Weinstein monofilament wire test within normal limits  bilaterally. Muscle power within normal limits bilaterally.  Nails Thick disfigured discolored nails with subungual debris  from hallux to fifth toes bilaterally. No evidence of bacterial infection or drainage bilaterally.  Orthopedic  No limitations of motion  feet .  No crepitus or effusions noted.  No bony pathology or digital deformities noted.  Skin  normotropic skin with no porokeratosis noted bilaterally.  No signs of infections or ulcers noted.     Onychomycosis  Pain in right toes  Pain in left toes  Consent was obtained for treatment procedures.   Mechanical debridement of nails 1-5  bilaterally performed with a nail nipper.  Filed with dremel without incident.    Return office visit  4 months                    Told patient to return for periodic foot care and evaluation due to potential at risk complications.   Gardiner Barefoot DPM

## 2021-12-29 ENCOUNTER — Encounter: Payer: Self-pay | Admitting: Primary Care

## 2021-12-29 ENCOUNTER — Ambulatory Visit (INDEPENDENT_AMBULATORY_CARE_PROVIDER_SITE_OTHER): Payer: Medicare Other | Admitting: Primary Care

## 2021-12-29 VITALS — BP 130/72 | HR 66 | Temp 98.1°F | Wt 215.0 lb

## 2021-12-29 DIAGNOSIS — I480 Paroxysmal atrial fibrillation: Secondary | ICD-10-CM

## 2021-12-29 DIAGNOSIS — I10 Essential (primary) hypertension: Secondary | ICD-10-CM | POA: Diagnosis not present

## 2021-12-29 DIAGNOSIS — E038 Other specified hypothyroidism: Secondary | ICD-10-CM | POA: Diagnosis not present

## 2021-12-29 DIAGNOSIS — Z23 Encounter for immunization: Secondary | ICD-10-CM

## 2021-12-29 DIAGNOSIS — I5032 Chronic diastolic (congestive) heart failure: Secondary | ICD-10-CM

## 2021-12-29 DIAGNOSIS — E1165 Type 2 diabetes mellitus with hyperglycemia: Secondary | ICD-10-CM

## 2021-12-29 DIAGNOSIS — I2510 Atherosclerotic heart disease of native coronary artery without angina pectoris: Secondary | ICD-10-CM

## 2021-12-29 DIAGNOSIS — K5900 Constipation, unspecified: Secondary | ICD-10-CM | POA: Diagnosis not present

## 2021-12-29 DIAGNOSIS — E538 Deficiency of other specified B group vitamins: Secondary | ICD-10-CM | POA: Diagnosis not present

## 2021-12-29 DIAGNOSIS — I701 Atherosclerosis of renal artery: Secondary | ICD-10-CM

## 2021-12-29 DIAGNOSIS — B351 Tinea unguium: Secondary | ICD-10-CM

## 2021-12-29 DIAGNOSIS — E782 Mixed hyperlipidemia: Secondary | ICD-10-CM

## 2021-12-29 LAB — TSH: TSH: 4.45 u[IU]/mL (ref 0.35–5.50)

## 2021-12-29 LAB — VITAMIN B12: Vitamin B-12: 954 pg/mL — ABNORMAL HIGH (ref 211–911)

## 2021-12-29 MED ORDER — LINZESS 145 MCG PO CAPS: ORAL_CAPSULE | ORAL | 3 refills | Status: DC

## 2021-12-29 NOTE — Assessment & Plan Note (Signed)
Continue vitamin B12 1000 mcg daily. Repeat vitamin D level pending.

## 2021-12-29 NOTE — Progress Notes (Signed)
Subjective:    Patient ID: Leonard Brace Sr., male    DOB: April 10, 1930, 88 y.o.   MRN: [id number]  HPI  Leonard Egnor [facility] Sr. is a very pleasant 88 y.o. male with a history of hypertension, CAD, type 2 diabetes, hypothyroidism, hyperlipidemia, GIB, decreased renal failure, osteoarthritis, paroxysmal atrial fibrillation, chronic constipation who presents today for follow-up of chronic conditions.  1) Chronic Constipation: Currently managed on Linzess 145 mcg capsules. Typically takes as needed, has been taking daily for the last week. Bowel movements occur every three days if he takes a Linzess.   2) CAD/Hypertension/Hyperlipidemia/Paroxysmal Atrial Fibrillation: Currently managed on amiodarone 200 mg daily, furosemide 40 mg daily, potassium chloride 10 mill equivalents daily, Xarelto 15 mg daily, rosuvastatin 10 mg daily.  Following with cardiology, last visit was in April 2023. He denies chest pain, shortness of breath, dizziness, rectal bleeding.   3) Hypothyroidism: Currently managed on levothyroxine 100 mcg daily six days weekly, and 150 mcg one day weekly. He is taking every morning on an empty stomach with water only. No food or other medication for 30 min.     Review of Systems  Respiratory:  Negative for shortness of breath.   Cardiovascular:  Negative for chest pain.  Gastrointestinal:  Positive for constipation.  Neurological:  Negative for dizziness and headaches.  Psychiatric/Behavioral:  The patient is not nervous/anxious.          Past Medical History:  Diagnosis Date   Anemia    Anxiety    Arrhythmia    PAROXYSMAL ATRIAL FIBRILLATION   Arthritis    OSTEOARTHRITIS   Cataracts, bilateral    Chronic back pain    Coronary artery disease 8/11   CABG...LM EMERGENT WITH IABP sees Dr. Legrand Como cooper   Dark brown urine 04/22/2020   DJD (degenerative joint disease)    DVT (deep venous thrombosis) (HCC)    Dyspnea    GERD (gastroesophageal reflux  disease)    History of BPH    Hyperlipidemia    Hypertension    Hypothyroidism    Iron deficiency anemia due to chronic blood loss 06/29/2019   Lumbar post-laminectomy syndrome    Myocardial infarction [facility])    Pulmonary embolism ([place]) 2009   hx of   RBBB (right bundle branch block)    Renal artery stenosis (HCC)    Thrombocytopenia (HCC)    Thyroid disease    HYPOTHYROIDISM   Type 2 diabetes mellitus without complication, without long-term current use of insulin ([place]) 07/28/2019   Unstable angina ([place])    NONE IN LONG TIME    Social History   Socioeconomic History   Marital status: Widowed    Spouse name: Not on file   Number of children: 2   Years of education: Not on file   Highest education level: Not on file  Occupational History   Occupation: RETIRED     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, [place]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  Tobacco Use   Smoking status: Former    Types: Cigarettes    Quit date: 04/13/1974    Years since quitting: 47.7   Smokeless tobacco: Former    Types: Chew    Quit date: 01/26/2002   Tobacco comments:    He has smoked about 27-pack-year hx   Vaping Use   Vaping Use: Never used  Substance and Sexual Activity   Alcohol use: Not Currently    Alcohol/week: 2.0 standard drinks of alcohol    Types: 2 Cans  of beer per week    Comment: He used to drink more heavily , but rarely drinks at the current time   Drug use: No   Sexual activity: Not on file  Other Topics Concern   Not on file  Social History Narrative   [place].   HE HAS 2 GROWN CHILDREN   HE IS RETIRED PRISON FIRM SUPERINTENDENT.   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   HE GOES TO THE [employer] 3 X WEEKLY.   Social Determinants of Health   Financial Resource Strain: Low Risk  (04/22/2021)   Overall Financial Resource Strain (CARDIA)    Difficulty of Paying Living Expenses: Not hard at all  Food Insecurity: No  Food Insecurity (04/22/2021)   Hunger Vital Sign    Worried About Running Out of Food in the Last Year: Never true    Ran Out of Food in the Last Year: Never true  Transportation Needs: No Transportation Needs (04/22/2021)   PRAPARE - Hydrologist (Medical): No    Lack of Transportation (Non-Medical): No  Physical Activity: Insufficiently Active (04/22/2021)   Exercise Vital Sign    Days of Exercise per Week: 7 days    Minutes of Exercise per Session: 20 min  Stress: No Stress Concern Present (04/22/2021)   [place]    Feeling of Stress : Only a little  Social Connections: Socially Isolated (04/22/2021)   Social Connection and Isolation Panel [NHANES]    Frequency of Communication with Friends and Family: Never    Frequency of Social Gatherings with Friends and Family: More than three times a week    Attends Religious Services: Never    Marine scientist or Organizations: No    Attends Archivist Meetings: Never    Marital Status: Widowed  Intimate Partner Violence: Not At Risk (04/22/2021)   Humiliation, Afraid, Rape, and Kick questionnaire    Fear of Current or Ex-Partner: No    Emotionally Abused: No    Physically Abused: No    Sexually Abused: No    Past Surgical History:  Procedure Laterality Date   Blood clot  Feb.20,  2016   Pulmonary Embolism   CARDIOVERSION N/A 03/15/2019   Procedure: CARDIOVERSION;  Surgeon: Pixie Casino, MD;  Location: [facility] ENDOSCOPY;  Service: Cardiovascular;  Laterality: N/A;   CAROTID ENDARTERECTOMY Right Oct. 17, 2013   CE   CAROTID ENDARTERECTOMY Left Dec. 3, 2016   CE   CATARACT EXTRACTION  05/2014,06/2014   COLONOSCOPY WITH PROPOFOL N/A 05/20/2019   Procedure: COLONOSCOPY WITH PROPOFOL;  Surgeon: [name]., MD;  Location: [place];  Service: Gastroenterology;  Laterality: N/A;   CORONARY ARTERY BYPASS GRAFT  11-21-09    EMERGENT X 3 GRAFTING. ..[name], MD, cc: [name]   ENDARTERECTOMY  01/28/2012   Procedure: ENDARTERECTOMY CAROTID;  Surgeon: [name], MD;  Location: [place];  Service: Vascular;  Laterality: Right;  with Primary Closure of Artery   ENDARTERECTOMY  03/15/2012   Procedure: ENDARTERECTOMY CAROTID;  Surgeon: [name], MD;  Location: [place];  Service: Vascular;  Laterality: Left;   ENTEROSCOPY N/A 05/23/2019   Procedure: ENTEROSCOPY;  Surgeon: [name], MD;  Location: [place];  Service: Endoscopy;  Laterality: N/A;   ESOPHAGOGASTRODUODENOSCOPY N/A 05/20/2019   Procedure: ESOPHAGOGASTRODUODENOSCOPY (EGD);  Surgeon: [name].,  MD;  Location: [place];  Service: Gastroenterology;  Laterality: N/A;   ESOPHAGOGASTRODUODENOSCOPY (EGD) WITH PROPOFOL N/A 05/19/2019   Procedure: ESOPHAGOGASTRODUODENOSCOPY (EGD) WITH PROPOFOL;  Surgeon: [name], MD;  Location: [place];  Service: Endoscopy;  Laterality: N/A;   FOOT SURGERY     GIVENS CAPSULE STUDY N/A 05/20/2019   Procedure: GIVENS CAPSULE STUDY;  Surgeon: [name]., MD;  Location: [place];  Service: Gastroenterology;  Laterality: N/A;   HOT HEMOSTASIS N/A 05/23/2019   Procedure: HOT HEMOSTASIS (ARGON PLASMA COAGULATION/BICAP);  Surgeon: [name], MD;  Location: [place];  Service: Endoscopy;  Laterality: N/A;   MANDIBLE SURGERY     MAXIMUM ACCESS (MAS)POSTERIOR LUMBAR INTERBODY FUSION (PLIF) 2 LEVEL N/A 08/08/2015   Procedure: Lumbar Four-Five, Lumbar Five-Sacral One Maximum access posterior lumbar interbody fusion;  Surgeon: [name], MD;  Location: [place] NEURO ORS;  Service: Neurosurgery;  Laterality: N/A;  LUMBAR FOUR-FIVE ,LUMBAR FIVE -SACRAL Maximum access posterior lumbar interbody fusion   POLYPECTOMY  05/20/2019   Procedure: POLYPECTOMY;  Surgeon: [name], Telford Nab., MD;  Location: [place];  Service: Gastroenterology;;   RENAL ARTERY STENT  2010    REPLACEMENT TOTAL KNEE  2006   RIGHT   SPINAL CORD STIMULATOR INSERTION N/A 06/18/2017   Procedure: LUMBAR SPINAL CORD STIMULATOR INSERTION;  Surgeon: [name], MD;  Location: [place];  Service: Neurosurgery;  Laterality: N/A;  LUMBAR SPINAL CORD STIMULATOR INSERTION   TOTAL HIP ARTHROPLASTY     right    Family History  Problem Relation Age of Onset   Lung cancer Father 53       deceased    Cancer Father 17       LUNG   Stroke Mother 3       DECEASED    Hyperlipidemia Mother    Heart attack Brother 47       deceased   Diabetes Brother    Liver cancer Sister 44       deceased    Cancer Sister     No Known Allergies  Current Outpatient Medications on File Prior to Visit  Medication Sig Dispense Refill   amiodarone (PACERONE) 200 MG tablet TAKE 1 TABLET BY MOUTH ONCE DAILY 90 tablet 3   cetirizine (ZYRTEC) 10 MG tablet TAKE 1 TABLET BY MOUTH ONCE DAILY FOR ALLERGIES. 90 tablet 0   ferrous sulfate 324 MG TBEC Take 324 mg by mouth at bedtime.     furosemide (LASIX) 40 MG tablet Take 40 mg by mouth in the morning.     levothyroxine (SYNTHROID) 100 MCG tablet TAKE 1 TABLET BY MOUTH ON EMPTY STOMACH WITH WATER ONLY ON SUNDAY THROUGH FRIDAY. TAKE 1 & 1/2 TABS SATURDAY ONLY.NO FOOD OR MEDS FOR 30 MINUTES AFTER. 94 tablet 0   potassium chloride (KLOR-CON) 10 MEQ tablet Take 1 tablet (10 mEq total) by mouth daily. 90 tablet 3   Rivaroxaban (XARELTO) 15 MG TABS tablet Take 1 tablet (15 mg total) by mouth daily with supper. Office visit required for further refills. 90 tablet 0   rosuvastatin (CRESTOR) 10 MG tablet TAKE ONE TABLET BY MOUTH IN THE EVENING for cholesterol. Office visit required for further refills. 90 tablet 0   vitamin B-12 (CYANOCOBALAMIN) 1000 MCG tablet TAKE 1 TABLET BY MOUTH ONCE DAILY 90 tablet 1   No current facility-administered medications on file prior to visit.    BP 130/72   Pulse 66   Temp 98.1 F (36.7 C) (Temporal)   Wt 215 lb (97.5 kg)  SpO2 97%    BMI 27.60 kg/m  Objective:   Physical Exam Cardiovascular:     Rate and Rhythm: Normal rate and regular rhythm.  Pulmonary:     Effort: Pulmonary effort is normal.     Breath sounds: Normal breath sounds. No wheezing or rales.  Abdominal:     General: Bowel sounds are normal.     Palpations: Abdomen is soft.     Tenderness: There is no abdominal tenderness.  Musculoskeletal:     Cervical back: Neck supple.  Skin:    General: Skin is warm and dry.  Neurological:     Mental Status: He is alert and oriented to person, place, and time.  Psychiatric:        Mood and Affect: Mood normal.           Assessment & Plan:   Problem List Items Addressed This Visit       Cardiovascular and Mediastinum   Essential hypertension (Chronic)    Controlled.  Remain off treatment. Continue to monitor.      Coronary artery disease involving native coronary artery of native heart without angina pectoris (Chronic)    Asymptomatic. Follow with cardiology, office notes reviewed from April 2023.  Continue rosuvastatin 10 mg daily.      PAF (paroxysmal atrial fibrillation) (HCC) (Chronic)    Rate and rhythm regular today. Following with cardiology, office notes reviewed from April 2023.  Continue amiodarone 200 mg daily, Xarelto 15 mg daily.      HFimpEF (heart failure with improved EF) (Chronic)    Follow with cardiology, office notes reviewed from April 2023.  Continue furosemide 40 mg daily. Appears euvolemic today.  Reviewed BMP from August 2023.      Renal artery atherosclerosis ([place])    Following with nephrology, continue blood pressure control monitoring. Reviewed BMP from August 2023.        Endocrine   Hypothyroidism    He appears to be taking levothyroxine correctly.  Continue levothyroxine 100 mcg 6 days weekly and 150 mcg 1 day weekly. Repeat TSH level pending.      Relevant Orders   TSH   Controlled type 2 diabetes mellitus ([place])    Repeat A1c  pending.  Encourage daily walking, work on diet. Continue off of treatment.        Musculoskeletal and Integument   Onychomycosis    Following with podiatry, no further intervention applicable. Continue podiatry evaluation for routine nail care.        Other   Mixed hyperlipidemia (Chronic)    Following with cardiology. Continue rosuvastatin 10 mg daily.      Constipation    Uncontrolled on Linzess every 3 days.  Increase Linzess 145 mcg to every other day, discussed with patient and son. Warning precautions provided regarding diarrhea.      Relevant Medications   LINZESS 145 MCG CAPS capsule   Vitamin B 12 deficiency - Primary    Continue vitamin B12 1000 mcg daily. Repeat vitamin D level pending.      Relevant Orders   Vitamin B12   Other Visit Diagnoses     Need for immunization against influenza       Relevant Orders   Flu Vaccine QUAD High Dose(Fluad) (Completed)          [name], NP

## 2021-12-29 NOTE — Assessment & Plan Note (Signed)
Uncontrolled on Linzess every 3 days.  Increase Linzess 145 mcg to every other day, discussed with patient and son. Warning precautions provided regarding diarrhea.

## 2021-12-29 NOTE — Assessment & Plan Note (Addendum)
Asymptomatic. Follow with cardiology, office notes reviewed from April 2023.  Continue rosuvastatin 10 mg daily.

## 2021-12-29 NOTE — Assessment & Plan Note (Signed)
Rate and rhythm regular today. Following with cardiology, office notes reviewed from April 2023.  Continue amiodarone 200 mg daily, Xarelto 15 mg daily.

## 2021-12-29 NOTE — Assessment & Plan Note (Signed)
Following with podiatry, no further intervention applicable. Continue podiatry evaluation for routine nail care.

## 2021-12-29 NOTE — Assessment & Plan Note (Signed)
Follow with cardiology, office notes reviewed from April 2023.  Continue furosemide 40 mg daily. Appears euvolemic today.  Reviewed BMP from August 2023.

## 2021-12-29 NOTE — Assessment & Plan Note (Signed)
Controlled.  Remain off treatment. Continue to monitor.

## 2021-12-29 NOTE — Assessment & Plan Note (Signed)
Repeat A1c pending.  Encourage daily walking, work on diet. Continue off of treatment.

## 2021-12-29 NOTE — Assessment & Plan Note (Signed)
Following with nephrology, continue blood pressure control monitoring. Reviewed BMP from August 2023.

## 2021-12-29 NOTE — Patient Instructions (Signed)
You can take the Linzess every other day for constipation.  Stop by the lab prior to leaving today. I will notify you of your results once received.   It was a pleasure to see you today!

## 2021-12-29 NOTE — Assessment & Plan Note (Signed)
He appears to be taking levothyroxine correctly.  Continue levothyroxine 100 mcg 6 days weekly and 150 mcg 1 day weekly. Repeat TSH level pending.

## 2021-12-29 NOTE — Assessment & Plan Note (Signed)
Following with cardiology. Continue rosuvastatin 10 mg daily.

## 2021-12-31 ENCOUNTER — Ambulatory Visit: Payer: Medicare Other | Admitting: Orthopaedic Surgery

## 2021-12-31 ENCOUNTER — Ambulatory Visit (INDEPENDENT_AMBULATORY_CARE_PROVIDER_SITE_OTHER): Payer: Medicare Other

## 2021-12-31 ENCOUNTER — Encounter: Payer: Self-pay | Admitting: Orthopaedic Surgery

## 2021-12-31 DIAGNOSIS — S42125A Nondisplaced fracture of acromial process, left shoulder, initial encounter for closed fracture: Secondary | ICD-10-CM

## 2021-12-31 DIAGNOSIS — S42123A Displaced fracture of acromial process, unspecified shoulder, initial encounter for closed fracture: Secondary | ICD-10-CM | POA: Insufficient documentation

## 2021-12-31 DIAGNOSIS — S42121A Displaced fracture of acromial process, right shoulder, initial encounter for closed fracture: Secondary | ICD-10-CM

## 2021-12-31 NOTE — Progress Notes (Signed)
Office Visit Note   Patient: Leonard Berg [facility]           Date of Birth: December 20, 1929           MRN: [id number] Visit Date: 12/31/2021              Requested by: Lesleigh Noe, MD [place],  [place] [address] PCP: [name], NP  Chief Complaint  Patient presents with   Left Shoulder - Fracture      HPI: [name] is a pleasant 88 year old gentleman who is accompanied by his son.  He is 3 weeks status post falling at home.  He had a full evaluation in the emergency room including a CT of his cervical spine, his head.  X-rays of his left humerus shoulder.  He also had a pelvis x-ray and a chest x-ray.  Findings were consistent with a comminuted acromion fracture.  He was placed in a sling and immobilizer.  He is on Xarelto and had quite a bit of ecchymosis especially in his left upper arm.  Past history of pulmonary emboli. he has not really been taking any pain medication because it made him constipated.  He cannot take anti-inflammatories because of his blood thinners.  He is doing a little better.  His difficult for him to ambulate with his walker with not being able to use his left arm.  He lives alone with frequent visits by his family  Assessment & Plan: Visit Diagnoses: Left shoulder acromion fracture   Plan: Had a long discussion with the patient and his son.  He may discontinue the sling and just use it as needed.  Should try to do some motion of the shoulder as tolerated.  He does have significant weakness and has difficulty holding up his arm which may indicate a rotator cuff tear.  However given the patient's comorbidities would be best to treat this and his son agrees conservatively.  We will have him follow-up in a month.  If there is any concerns certainly we could order an MRI.  Follow-Up Instructions: 1 month  Ortho Exam  Patient is alert, oriented, no adenopathy, well-dressed, normal affect, normal respiratory effort. Left shoulder  removed from the sling and immobilizer.  He has good sensation distally in his hand radial pulses intact he has good grip strength.  Good extension and flexion of his elbow.  He does have some lack of tone in his biceps.  He has significant resolving ecchymosis in the left upper arm into the shoulder.  He is tender over the [facility] joint and the acromion.  All some tenderness over the proximal biceps.  He has passive range of motion to about 110 degrees and external/internal rotation.  He is not able to actively raise his arm above his head or behind his back.  After removal of the sling he was able to use his rolling walker without much trouble.  Imaging: XR Scapula Left  Result Date: 12/31/2021 Radiographs of the left scapula were reviewed today.  Well-maintained alignment humeral head is well reduced in the glenoid fossa.  He does have a comminuted fracture of the acromion however is in good position.  No evidence of any humeral head fracture or scapular body fracture he does have some arthritis of the acromioclavicular joint.  There is an area of ectopic calcification inferior to the tip of the acromion age indeterminate.  No images are attached to the encounter.  Labs: Lab Results  Component Value Date   HGBA1C 5.8 11/07/2020   HGBA1C 5.9 (A) 03/11/2020   HGBA1C 5.4 07/28/2019   LABORGA NO GROWTH 11/19/2015     Lab Results  Component Value Date   ALBUMIN 4.2 11/19/2021   ALBUMIN 3.5 (L) 08/01/2021   ALBUMIN 4.0 11/13/2020    Lab Results  Component Value Date   MG 2.8 (H) 05/19/2019   MG 2.5 11/19/2009   MG 2.6 (H) 11/19/2009   Lab Results  Component Value Date   VD25OH 21.7 11/13/2020   VD25OH 24.4 01/31/2020   VD25OH 25.9 07/27/2019    No results found for: "PREALBUMIN"    Latest Ref Rng & Units 12/09/2021   12:49 AM 08/01/2021    9:56 AM 11/13/2020   12:00 AM  CBC EXTENDED  WBC 4.0 - 10.5 K/uL 9.9  6.0  6.2   RBC 4.22 - 5.81 MIL/uL 4.67  4.23  4.44   Hemoglobin 13.0 -  17.0 g/dL 15.3  13.7  14.4   HCT 39.0 - 52.0 % 47.5  41.8  43   Platelets 150 - 400 K/uL 173  154  169   NEUT# 1.7 - 7.7 K/uL 6.6     Lymph# 0.7 - 4.0 K/uL 2.2        There is no height or weight on file to calculate BMI.  Orders:  Orders Placed This Encounter  Procedures   XR Scapula Left   No orders of the defined types were placed in this encounter.    Procedures: No procedures performed  Clinical Data: No additional findings.  ROS:  All other systems negative, except as noted in the HPI. Review of Systems  All other systems reviewed and are negative.   Objective: Vital Signs: There were no vitals taken for this visit.  Specialty Comments:  No specialty comments available.  PMFS History: Patient Active Problem List   Diagnosis Date Noted   Closed fracture of acromion 12/31/2021   Blood clotting disorder ([place]) 12/22/2021   HFimpEF (heart failure with improved EF) 07/31/2021   Onychomycosis 04/24/2021   Vitamin B 12 deficiency 11/07/2020   Closed fracture of greater trochanter of femur ([place]) 09/21/2020   Falls, initial encounter 09/21/2020   Controlled type 2 diabetes mellitus ([place]) 07/28/2019   Iron deficiency anemia due to chronic blood loss 06/29/2019   GIB (gastrointestinal bleeding) 05/18/2019   Constipation 04/19/2018   Medicare annual wellness visit, subsequent 11/19/2015   Spondylolisthesis of lumbar region 08/08/2015   Renal failure 05/23/2015   History of pulmonary embolism 06/03/2014   Carotid artery disease ([place]) 01/27/2012   Renal artery atherosclerosis ([place]) 07/16/2010   CAROTID BRUIT 02/11/2010   Mixed hyperlipidemia 12/17/2009   Hypothyroidism 12/12/2009   ANEMIA 12/12/2009   THROMBOCYTOPENIA 12/12/2009   Essential hypertension 12/12/2009   Coronary artery disease involving native coronary artery of native heart without angina pectoris 12/12/2009   RIGHT BUNDLE BRANCH BLOCK 12/12/2009   PAF (paroxysmal atrial fibrillation) ([place])  12/12/2009   Osteoarthritis 12/12/2009   BPH (benign prostatic hyperplasia) 12/12/2009   Past Medical History:  Diagnosis Date   Anemia    Anxiety    Arrhythmia    PAROXYSMAL ATRIAL FIBRILLATION   Arthritis    OSTEOARTHRITIS   Cataracts, bilateral    Chronic back pain    Coronary artery disease 8/11   CABG...LM EMERGENT WITH IABP sees Dr. Legrand Como cooper   Dark brown urine 04/22/2020   DJD (degenerative joint disease)    DVT (deep venous thrombosis) ([place])  Dyspnea    GERD (gastroesophageal reflux disease)    History of BPH    Hyperlipidemia    Hypertension    Hypothyroidism    Iron deficiency anemia due to chronic blood loss 06/29/2019   Lumbar post-laminectomy syndrome    Myocardial infarction [facility])    Pulmonary embolism ([place]) 2009   hx of   RBBB (right bundle branch block)    Renal artery stenosis (HCC)    Thrombocytopenia (HCC)    Thyroid disease    HYPOTHYROIDISM   Type 2 diabetes mellitus without complication, without long-term current use of insulin ([place]) 07/28/2019   Unstable angina (HCC)    NONE IN LONG TIME    Family History  Problem Relation Age of Onset   Lung cancer Father 25       deceased    Cancer Father 30       LUNG   Stroke Mother 73       DECEASED    Hyperlipidemia Mother    Heart attack Brother 87       deceased   Diabetes Brother    Liver cancer Sister 83       deceased    Cancer Sister     Past Surgical History:  Procedure Laterality Date   Blood clot  Feb.20,  2016   Pulmonary Embolism   CARDIOVERSION N/A 03/15/2019   Procedure: CARDIOVERSION;  Surgeon: Pixie Casino, MD;  Location: [place];  Service: Cardiovascular;  Laterality: N/A;   CAROTID ENDARTERECTOMY Right Oct. 17, 2013   CE   CAROTID ENDARTERECTOMY Left Dec. 3, 2016   CE   CATARACT EXTRACTION  05/2014,06/2014   COLONOSCOPY WITH PROPOFOL N/A 05/20/2019   Procedure: COLONOSCOPY WITH PROPOFOL;  Surgeon: [name]., MD;  Location: [facility] ENDOSCOPY;  Service:  Gastroenterology;  Laterality: N/A;   CORONARY ARTERY BYPASS GRAFT  11-21-09   EMERGENT X 3 GRAFTING. ..Arlesia Kiel Prescott Gum, MD, cc: [name]   ENDARTERECTOMY  01/28/2012   Procedure: ENDARTERECTOMY CAROTID;  Surgeon: [name], MD;  Location: [place];  Service: Vascular;  Laterality: Right;  with Primary Closure of Artery   ENDARTERECTOMY  03/15/2012   Procedure: ENDARTERECTOMY CAROTID;  Surgeon: [name], MD;  Location: [place];  Service: Vascular;  Laterality: Left;   ENTEROSCOPY N/A 05/23/2019   Procedure: ENTEROSCOPY;  Surgeon: [name], MD;  Location: [place];  Service: Endoscopy;  Laterality: N/A;   ESOPHAGOGASTRODUODENOSCOPY N/A 05/20/2019   Procedure: ESOPHAGOGASTRODUODENOSCOPY (EGD);  Surgeon: [name]., MD;  Location: [place];  Service: Gastroenterology;  Laterality: N/A;   ESOPHAGOGASTRODUODENOSCOPY (EGD) WITH PROPOFOL N/A 05/19/2019   Procedure: ESOPHAGOGASTRODUODENOSCOPY (EGD) WITH PROPOFOL;  Surgeon: [name], MD;  Location: [place];  Service: Endoscopy;  Laterality: N/A;   FOOT SURGERY     GIVENS CAPSULE STUDY N/A 05/20/2019   Procedure: GIVENS CAPSULE STUDY;  Surgeon: [name]., MD;  Location: [place];  Service: Gastroenterology;  Laterality: N/A;   HOT HEMOSTASIS N/A 05/23/2019   Procedure: HOT HEMOSTASIS (ARGON PLASMA COAGULATION/BICAP);  Surgeon: [name], MD;  Location: [place];  Service: Endoscopy;  Laterality: N/A;   MANDIBLE SURGERY     MAXIMUM ACCESS (MAS)POSTERIOR LUMBAR INTERBODY FUSION (PLIF) 2 LEVEL N/A 08/08/2015   Procedure: Lumbar Four-Five, Lumbar Five-Sacral One Maximum access posterior lumbar interbody fusion;  Surgeon: [name], MD;  Location: [place] NEURO ORS;  Service: Neurosurgery;  Laterality: N/A;  LUMBAR FOUR-FIVE ,LUMBAR FIVE -SACRAL Maximum access posterior lumbar interbody fusion   POLYPECTOMY  05/20/2019   Procedure: POLYPECTOMY;  Surgeon: Rush Landmark Telford Nab., MD;   Location: [place];  Service: Gastroenterology;;   RENAL ARTERY STENT  2010   REPLACEMENT TOTAL KNEE  2006   RIGHT   SPINAL CORD STIMULATOR INSERTION N/A 06/18/2017   Procedure: LUMBAR SPINAL CORD STIMULATOR INSERTION;  Surgeon: [name], MD;  Location: [place];  Service: Neurosurgery;  Laterality: N/A;  LUMBAR SPINAL CORD STIMULATOR INSERTION   TOTAL HIP ARTHROPLASTY     right   Social History   Occupational History   Occupation: RETIRED     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

## 2022-01-06 ENCOUNTER — Other Ambulatory Visit: Payer: Self-pay | Admitting: Primary Care

## 2022-01-06 DIAGNOSIS — E038 Other specified hypothyroidism: Secondary | ICD-10-CM

## 2022-01-12 ENCOUNTER — Telehealth: Payer: Self-pay

## 2022-01-12 NOTE — Progress Notes (Signed)
Chronic Care Management Pharmacy Assistant   Name: [facility]  MRN: [id number] DOB: 1930/03/09  Reason for Encounter: CCM (General Adherence)  Recent office visits:  12/29/21 Leonard Friendly, NP Vitamin B 12 deficiency Immunizations: Fluad Quad(high Dose 65+) Change: Increase Linzess 145 mcg to every other day  Recent consult visits:  12/31/21 Leonard Fears, MD (Orthopedic Surgery) Left Shoulder Fracture Order: XR Scapula Left  12/22/21 Leonard Berg, DPM (Podiatry) Nail Problem Procedure: Mechanical debridement of nails 1-5  bilaterally performed with a nail nipper.  Hospital visits:  None in previous 6 months  Medications: Outpatient Encounter Medications as of 01/12/2022  Medication Sig   amiodarone (PACERONE) 200 MG tablet TAKE 1 TABLET BY MOUTH ONCE DAILY   cetirizine (ZYRTEC) 10 MG tablet TAKE 1 TABLET BY MOUTH ONCE DAILY FOR ALLERGIES.   ferrous sulfate 324 MG TBEC Take 324 mg by mouth at bedtime.   furosemide (LASIX) 40 MG tablet Take 40 mg by mouth in the morning.   levothyroxine (SYNTHROID) 100 MCG tablet TAKE 1 TABLET BY MOUTH ON EMPTY STOMACH WITH WATER ONLY ON "SUNDAY THROUGH FRIDAY. TAKE 1 & 1/2 TABS SATURDAY ONLY.NO FOOD OR MEDS FOR 30 MINUTES AFTER.   LINZESS 145 MCG CAPS capsule TAKE ONE CAPSULE BY MOUTH EVERY OTHER DAY FOR CONSTIPATION   potassium chloride (KLOR-CON) 10 MEQ tablet Take 1 tablet (10 mEq total) by mouth daily.   Rivaroxaban (XARELTO) 15 MG TABS tablet Take 1 tablet (15 mg total) by mouth daily with supper. Office visit required for further refills.   rosuvastatin (CRESTOR) 10 MG tablet TAKE ONE TABLET BY MOUTH IN THE EVENING for cholesterol. Office visit required for further refills.   vitamin B-12 (CYANOCOBALAMIN) 1000 MCG tablet TAKE 1 TABLET BY MOUTH ONCE DAILY   No facility-administered encounter medications on file as of 01/12/2022.    Contacted Leonard [facility] Sr. on 01/12/2022 for general disease state and  medication adherence call. Spoke with Leonard Berg (patient's son for the encounter)  Patient is not more than 5 days past due for refill on the following medications per chart history:  Star Rating Drugs: Medication:                Last Fill:         Day Supply Rosuvastatin 10 mg    10/31/21          90"$    What concerns do you have about your medications? None  The patient denies side effects with their medications.   How often do you forget or accidentally miss a dose? Never  Do you use a pillbox? No  Are you having any problems getting your medications from your pharmacy? No  Has the cost of your medications been a concern? No  Since last visit with CPP, the following interventions have been made.  Increase Linzess 145 mcg to every other day  The patient has not had an ED visit since last contact.   The patient denies problems with their health.   Patient denies concerns or questions for Leonard Berg, PharmD at this time. [name] stated they never did hear from [facility], but they no longer need it or desire it as the patient is doing well. They did hear back from Orthopedics and he said they have been there and everything is good.   Care Gaps: Annual wellness visit in last year? Yes 04/22/2021 Most Recent BP reading: 130/72 on 12/29/2021  If Diabetic: Most recent A1C reading: 5.8 on  11/07/2020 Last eye exam / retinopathy screening:  Up to date Last diabetic foot exam: Up to date  Summary of recommendations from last [place] visit (Date:12/12/2021  Summary: CCM F/U Visit -Reviewed medications; affirms compliance as prescribed, denies issues -Pt had a mechanical fall 8/29 and went to ED, his shoulder is in a sling and uses a walker which is difficult one-handed. Home Health referral was placed in ED but pt has not heard from them. He was also told to follow up with orthopedics within 2 weeks but  has not heard from them either - per chart no ortho referral was  ordered, pt last saw [facility] in 2019 so would likely need new referral.    Recommendations/Changes made from today's visit: -Coordinate with primary team to follow up on home health referral and orthopedic referral - message sent to [facility]   Follow up Plan: -[place] will call patient 3-4 weeks for general update -PCP f/u 12/29/21)  Upcoming appointments: Leonard Berg appointment on 01/28/2022  Leonard Berg, CPP notified  Leonard Berg, [place] Assistant [phone]

## 2022-01-28 ENCOUNTER — Ambulatory Visit (INDEPENDENT_AMBULATORY_CARE_PROVIDER_SITE_OTHER): Payer: Medicare Other

## 2022-01-28 ENCOUNTER — Ambulatory Visit: Payer: Medicare Other | Admitting: Orthopaedic Surgery

## 2022-01-28 ENCOUNTER — Encounter: Payer: Self-pay | Admitting: Orthopaedic Surgery

## 2022-01-28 DIAGNOSIS — S42125A Nondisplaced fracture of acromial process, left shoulder, initial encounter for closed fracture: Secondary | ICD-10-CM

## 2022-01-28 DIAGNOSIS — M542 Cervicalgia: Secondary | ICD-10-CM

## 2022-01-28 NOTE — Progress Notes (Signed)
Office Visit Note   Patient: [facility]           Date of Birth: 10/01/1929           MRN: [id number] Visit Date: 01/28/2022              Requested by: [name], NP [place],  [place] [id number] PCP: [name], NP   Assessment & Plan: Visit Diagnoses:  1. Closed nondisplaced fracture of acromial process of left scapula, initial encounter   2. Neck pain     Plan: Approximately 7 weeks status post injury to his left nondominant shoulder with prior x-rays demonstrating a fracture of the acromion.  Now having less pain and able to place his arm almost fully overhead.  Has multiple medical comorbidities and lives alone.  He does use a rolling walker.  He is accompanied by his son.  Films of his left shoulder unchanged from last month.  No obvious callus but less pain.  Has considerable degenerative change of the cervical spine but I do not think it is referred discomfort to the left upper extremity.  I think he be a good candidate for home health therapy and will make the consult as he does live by himself and is unable to drive.  Like to check him back in a month.  Long discussion regarding his pain, limitation of motion and possible rotator cuff tear.  At this point we will continue to treat nonoperatively and without further diagnostic studies.  Son was fine with that.  Follow-Up Instructions: Return in about 1 month (around 02/28/2022).   Orders:  Orders Placed This Encounter  Procedures   XR Scapula Left   XR Cervical Spine 2 or 3 views   Ambulatory referral to [place]   No orders of the defined types were placed in this encounter.     Procedures: No procedures performed   Clinical Data: No additional findings.   Subjective: Chief Complaint  Patient presents with   Right Shoulder - Pain, Follow-up  7 weeks status post injury as previously outlined.  Feeling better.  Able to get up and move around and uses rolling  walker with less pain and even able to place his arm over his head.  Denies any numbness or tingling to his left nondominant upper extremity  HPI  Review of Systems   Objective: Vital Signs: There were no vitals taken for this visit.  Physical Exam Constitutional:      Appearance: He is well-developed.  Pulmonary:     Effort: Pulmonary effort is normal.  Skin:    General: Skin is warm and dry.  Neurological:     Mental Status: He is alert and oriented to person, place, and time.  Psychiatric:        Behavior: Behavior normal.     Ortho Exam awake alert and oriented x3.  Comfortable sitting.  Hard of hearing.  Has some tenderness along the acromion anteriorly and laterally of the left shoulder but skin is intact.  Good biceps.  Able to place his arm almost overhead lacking maybe 30 degrees and was able to abduct at least 80 degrees.  This is a significant difference from the last evaluation when he had little if any movement.  Good grip and release  Specialty Comments:  No specialty comments available.  Imaging: XR Cervical Spine 2 or 3 views  Result Date: 01/28/2022 Films of the cervical spine were obtained  in 2 projections.  There is considerable degenerative arthritis throughout the cervical spine but particularly at C5-6 and C6-7 where there is little if any to space remaining and facet sclerosis.  Very minimal subluxation of C5 on 6 and no ectopic calcification.  XR Scapula Left  Result Date: 01/28/2022 Films of the left scapula obtained in several projections and compared to those films that were performed in September.  The acromial fracture is identified and appears to be unchanged.  There is an area of calcification lateral and somewhat inferior to the tip of the acromion.  I suspect this is old and unchanged from the recent films in September    PMFS History: Patient Active Problem List   Diagnosis Date Noted   Closed fracture of acromion 12/31/2021   Blood  clotting disorder ([place]) 12/22/2021   HFimpEF (heart failure with improved EF) 07/31/2021   Onychomycosis 04/24/2021   Vitamin B 12 deficiency 11/07/2020   Closed fracture of greater trochanter of femur ([facility]) 09/21/2020   Falls, initial encounter 09/21/2020   Controlled type 2 diabetes mellitus ([place]) 07/28/2019   Iron deficiency anemia due to chronic blood loss 06/29/2019   GIB (gastrointestinal bleeding) 05/18/2019   Constipation 04/19/2018   Medicare annual wellness visit, subsequent 11/19/2015   Spondylolisthesis of lumbar region 08/08/2015   Renal failure 05/23/2015   History of pulmonary embolism 06/03/2014   Carotid artery disease ([place]) 01/27/2012   Renal artery atherosclerosis ([place]) 07/16/2010   CAROTID BRUIT 02/11/2010   Mixed hyperlipidemia 12/17/2009   Hypothyroidism 12/12/2009   ANEMIA 12/12/2009   THROMBOCYTOPENIA 12/12/2009   Essential hypertension 12/12/2009   Coronary artery disease involving native coronary artery of native heart without angina pectoris 12/12/2009   RIGHT BUNDLE BRANCH BLOCK 12/12/2009   PAF (paroxysmal atrial fibrillation) ([place]) 12/12/2009   Osteoarthritis 12/12/2009   BPH (benign prostatic hyperplasia) 12/12/2009   Past Medical History:  Diagnosis Date   Anemia    Anxiety    Arrhythmia    PAROXYSMAL ATRIAL FIBRILLATION   Arthritis    OSTEOARTHRITIS   Cataracts, bilateral    Chronic back pain    Coronary artery disease 8/11   CABG...LM EMERGENT WITH IABP sees Dr. Legrand Como cooper   Dark brown urine 04/22/2020   DJD (degenerative joint disease)    DVT (deep venous thrombosis) (HCC)    Dyspnea    GERD (gastroesophageal reflux disease)    History of BPH    Hyperlipidemia    Hypertension    Hypothyroidism    Iron deficiency anemia due to chronic blood loss 06/29/2019   Lumbar post-laminectomy syndrome    Myocardial infarction [facility])    Pulmonary embolism ([place]) 2009   hx of   RBBB (right bundle branch block)    Renal artery stenosis (HCC)     Thrombocytopenia (HCC)    Thyroid disease    HYPOTHYROIDISM   Type 2 diabetes mellitus without complication, without long-term current use of insulin ([place]) 07/28/2019   Unstable angina (HCC)    NONE IN LONG TIME    Family History  Problem Relation Age of Onset   Lung cancer Father 39       deceased    Cancer Father 35       LUNG   Stroke Mother 65       DECEASED    Hyperlipidemia Mother    Heart attack Brother 27       deceased   Diabetes Brother    Liver cancer Sister 39  deceased    Cancer Sister     Past Surgical History:  Procedure Laterality Date   Blood clot  Feb.20,  2016   Pulmonary Embolism   CARDIOVERSION N/A 03/15/2019   Procedure: CARDIOVERSION;  Surgeon: Pixie Casino, MD;  Location[facility] ENDOSCOPY;  Service: Cardiovascular;  Laterality: N/A;   CAROTID ENDARTERECTOMY Right Oct. 17, 2013   CE   CAROTID ENDARTERECTOMY Left Dec. 3, 2016   CE   CATARACT EXTRACTION  05/2014,06/2014   COLONOSCOPY WITH PROPOFOL N/A 05/20/2019   Procedure: COLONOSCOPY WITH PROPOFOL;  Surgeon: [name]., MD;  Location: [place];  Service: Gastroenterology;  Laterality: N/A;   CORONARY ARTERY BYPASS GRAFT  11-21-09   EMERGENT X 3 GRAFTING. ..Secilia Apps Prescott Gum, MD, cc: [name]   ENDARTERECTOMY  01/28/2012   Procedure: ENDARTERECTOMY CAROTID;  Surgeon: [name], MD;  Location: [place];  Service: Vascular;  Laterality: Right;  with Primary Closure of Artery   ENDARTERECTOMY  03/15/2012   Procedure: ENDARTERECTOMY CAROTID;  Surgeon: [name], MD;  Location: [place];  Service: Vascular;  Laterality: Left;   ENTEROSCOPY N/A 05/23/2019   Procedure: ENTEROSCOPY;  Surgeon: [name], MD;  Location: [place];  Service: Endoscopy;  Laterality: N/A;   ESOPHAGOGASTRODUODENOSCOPY N/A 05/20/2019   Procedure: ESOPHAGOGASTRODUODENOSCOPY (EGD);  Surgeon: [name]., MD;  Location: [place];  Service: Gastroenterology;  Laterality:  N/A;   ESOPHAGOGASTRODUODENOSCOPY (EGD) WITH PROPOFOL N/A 05/19/2019   Procedure: ESOPHAGOGASTRODUODENOSCOPY (EGD) WITH PROPOFOL;  Surgeon: [name], MD;  Location: [place];  Service: Endoscopy;  Laterality: N/A;   FOOT SURGERY     GIVENS CAPSULE STUDY N/A 05/20/2019   Procedure: GIVENS CAPSULE STUDY;  Surgeon: [name]., MD;  Location: [place];  Service: Gastroenterology;  Laterality: N/A;   HOT HEMOSTASIS N/A 05/23/2019   Procedure: HOT HEMOSTASIS (ARGON PLASMA COAGULATION/BICAP);  Surgeon: [name], MD;  Location: [place];  Service: Endoscopy;  Laterality: N/A;   MANDIBLE SURGERY     MAXIMUM ACCESS (MAS)POSTERIOR LUMBAR INTERBODY FUSION (PLIF) 2 LEVEL N/A 08/08/2015   Procedure: Lumbar Four-Five, Lumbar Five-Sacral One Maximum access posterior lumbar interbody fusion;  Surgeon: [name], MD;  Location: [place] NEURO ORS;  Service: Neurosurgery;  Laterality: N/A;  LUMBAR FOUR-FIVE ,LUMBAR FIVE -SACRAL Maximum access posterior lumbar interbody fusion   POLYPECTOMY  05/20/2019   Procedure: POLYPECTOMY;  Surgeon: [name], Telford Nab., MD;  Location: [place];  Service: Gastroenterology;;   RENAL ARTERY STENT  2010   REPLACEMENT TOTAL KNEE  2006   RIGHT   SPINAL CORD STIMULATOR INSERTION N/A 06/18/2017   Procedure: LUMBAR SPINAL CORD STIMULATOR INSERTION;  Surgeon: [name], MD;  Location: [place];  Service: Neurosurgery;  Laterality: N/A;  LUMBAR SPINAL CORD STIMULATOR INSERTION   TOTAL HIP ARTHROPLASTY     right   Social History   Occupational History   Occupation: RETIRED     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, [place]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  Tobacco Use   Smoking status: Former    Types: Cigarettes    Quit date: 04/13/1974    Years since quitting: 47.8   Smokeless tobacco: Former    Types: Chew    Quit date: 01/26/2002   Tobacco comments:    He has smoked about 27-pack-year hx   Vaping Use   Vaping Use: Never used  Substance and  Sexual Activity   Alcohol use: Not Currently    Alcohol/week: 2.0 standard drinks of alcohol    Types:  2 Cans of beer per week    Comment: He used to drink more heavily , but rarely drinks at the current time   Drug use: No   Sexual activity: Not on file     Garald Balding, MD   Note - This record has been created using [employer].  Chart creation errors have been sought, but may not always  have been located. Such creation errors do not reflect on  the standard of medical care.

## 2022-01-30 ENCOUNTER — Other Ambulatory Visit: Payer: Self-pay | Admitting: Primary Care

## 2022-01-30 DIAGNOSIS — J309 Allergic rhinitis, unspecified: Secondary | ICD-10-CM

## 2022-01-30 DIAGNOSIS — E785 Hyperlipidemia, unspecified: Secondary | ICD-10-CM

## 2022-02-12 DIAGNOSIS — I2510 Atherosclerotic heart disease of native coronary artery without angina pectoris: Secondary | ICD-10-CM | POA: Diagnosis not present

## 2022-02-12 DIAGNOSIS — G8929 Other chronic pain: Secondary | ICD-10-CM | POA: Diagnosis not present

## 2022-02-12 DIAGNOSIS — I110 Hypertensive heart disease with heart failure: Secondary | ICD-10-CM | POA: Diagnosis not present

## 2022-02-12 DIAGNOSIS — E119 Type 2 diabetes mellitus without complications: Secondary | ICD-10-CM | POA: Diagnosis not present

## 2022-02-12 DIAGNOSIS — I701 Atherosclerosis of renal artery: Secondary | ICD-10-CM | POA: Diagnosis not present

## 2022-02-12 DIAGNOSIS — M549 Dorsalgia, unspecified: Secondary | ICD-10-CM | POA: Diagnosis not present

## 2022-02-12 DIAGNOSIS — S42125D Nondisplaced fracture of acromial process, left shoulder, subsequent encounter for fracture with routine healing: Secondary | ICD-10-CM | POA: Diagnosis not present

## 2022-02-12 DIAGNOSIS — E782 Mixed hyperlipidemia: Secondary | ICD-10-CM | POA: Diagnosis not present

## 2022-02-12 DIAGNOSIS — I4510 Unspecified right bundle-branch block: Secondary | ICD-10-CM | POA: Diagnosis not present

## 2022-02-12 DIAGNOSIS — M542 Cervicalgia: Secondary | ICD-10-CM | POA: Diagnosis not present

## 2022-02-12 DIAGNOSIS — M1990 Unspecified osteoarthritis, unspecified site: Secondary | ICD-10-CM | POA: Diagnosis not present

## 2022-02-12 DIAGNOSIS — E039 Hypothyroidism, unspecified: Secondary | ICD-10-CM | POA: Diagnosis not present

## 2022-02-12 DIAGNOSIS — I5032 Chronic diastolic (congestive) heart failure: Secondary | ICD-10-CM | POA: Diagnosis not present

## 2022-02-12 DIAGNOSIS — M25511 Pain in right shoulder: Secondary | ICD-10-CM | POA: Diagnosis not present

## 2022-02-12 DIAGNOSIS — M4316 Spondylolisthesis, lumbar region: Secondary | ICD-10-CM | POA: Diagnosis not present

## 2022-02-12 DIAGNOSIS — I480 Paroxysmal atrial fibrillation: Secondary | ICD-10-CM | POA: Diagnosis not present

## 2022-02-25 ENCOUNTER — Encounter: Payer: Self-pay | Admitting: Orthopaedic Surgery

## 2022-02-25 ENCOUNTER — Ambulatory Visit: Payer: Medicare Other | Admitting: Orthopaedic Surgery

## 2022-02-25 DIAGNOSIS — S42122A Displaced fracture of acromial process, left shoulder, initial encounter for closed fracture: Secondary | ICD-10-CM

## 2022-02-25 NOTE — Progress Notes (Signed)
Office Visit Note   Patient: Leonard Berg [facility] Sr.           Date of Birth: May 28, 1929           MRN: [id number] Visit Date: 02/25/2022              Requested by: [name], NP [place],  [place] [id number] PCP: [name], NP   Assessment & Plan: Visit Diagnoses:  1. Closed displaced fracture of acromial process of left scapula, initial encounter     Plan: Mr. Venus is again accompanied by his son and seen for follow-up evaluation of the left shoulder injury that was previously outlined.  He did have a fall in had a fracture of the left acromion.  He is several months out from the injury and has been receiving home health therapy which he believes is made a difference.  He does live by himself and is fairly independent.  His son relates that he is able to do pretty much everything he was able to do before his fall.  He still has a little bit of tenderness over the acromion but has functional range of motion.  Discussion regarding his injury.  He certainly could have some other underlying problems  I.e. Rotator cuff tear but he does not wish to pursue any further diagnostic studies or treatment.  For the moment he is happy as is his son they did not have any further questions.  I will plan to see him back as needed.  Follow-Up Instructions: Return if symptoms worsen or fail to improve.   Orders:  No orders of the defined types were placed in this encounter.  No orders of the defined types were placed in this encounter.     Procedures: No procedures performed   Clinical Data: No additional findings.   Subjective: Chief Complaint  Patient presents with   Neck - Follow-up   Left Shoulder - Follow-up  Several months status post left shoulder injury with a fracture of the acromion.  Seems to have had fairly routine healing and is receiving home health physical therapy.  Multiple medical comorbidities.  Does live by himself and is fairly  independent and even cutting his own grass.  HPI  Review of Systems   Objective: Vital Signs: There were no vitals taken for this visit.  Physical Exam Constitutional:      Appearance: He is well-developed.  Pulmonary:     Effort: Pulmonary effort is normal.  Skin:    General: Skin is warm and dry.  Neurological:     Mental Status: He is alert and oriented to person, place, and time.  Psychiatric:        Behavior: Behavior normal.     Ortho Exam awake alert and oriented x3.  Comfortable sitting.  No acute distress.  Evaluated in a wheelchair.  Able to place his left arm overhead to within at least 30 degrees of full motion.  Similar motion on the right side.  Does have some positive impingement testing but reasonable grip.  There is some intrinsic atrophy on both sides i.e. right and left upper extremity.  Good capillary refill to the fingers.  There is no crepitation.  No pain at the acromion or distal clavicle or even in the anterior lateral subacromial region.  Biceps intact.  Skin intact  Specialty Comments:  No specialty comments available.  Imaging: No results found.   PMFS History: Patient Active Problem List  Diagnosis Date Noted   Closed fracture of acromion 12/31/2021   Blood clotting disorder ([place]) 12/22/2021   HFimpEF (heart failure with improved EF) 07/31/2021   Onychomycosis 04/24/2021   Vitamin B 12 deficiency 11/07/2020   Closed fracture of greater trochanter of femur ([place]) 09/21/2020   Falls, initial encounter 09/21/2020   Controlled type 2 diabetes mellitus ([place]) 07/28/2019   Iron deficiency anemia due to chronic blood loss 06/29/2019   GIB (gastrointestinal bleeding) 05/18/2019   Constipation 04/19/2018   Medicare annual wellness visit, subsequent 11/19/2015   Spondylolisthesis of lumbar region 08/08/2015   Renal failure 05/23/2015   History of pulmonary embolism 06/03/2014   Carotid artery disease ([place]) 01/27/2012   Renal artery atherosclerosis  ([place]) 07/16/2010   CAROTID BRUIT 02/11/2010   Mixed hyperlipidemia 12/17/2009   Hypothyroidism 12/12/2009   ANEMIA 12/12/2009   THROMBOCYTOPENIA 12/12/2009   Essential hypertension 12/12/2009   Coronary artery disease involving native coronary artery of native heart without angina pectoris 12/12/2009   RIGHT BUNDLE BRANCH BLOCK 12/12/2009   PAF (paroxysmal atrial fibrillation) ([place]) 12/12/2009   Osteoarthritis 12/12/2009   BPH (benign prostatic hyperplasia) 12/12/2009   Past Medical History:  Diagnosis Date   Anemia    Anxiety    Arrhythmia    PAROXYSMAL ATRIAL FIBRILLATION   Arthritis    OSTEOARTHRITIS   Cataracts, bilateral    Chronic back pain    Coronary artery disease 8/11   CABG...LM EMERGENT WITH IABP sees Dr. Legrand Como cooper   Dark brown urine 04/22/2020   DJD (degenerative joint disease)    DVT (deep venous thrombosis) (HCC)    Dyspnea    GERD (gastroesophageal reflux disease)    History of BPH    Hyperlipidemia    Hypertension    Hypothyroidism    Iron deficiency anemia due to chronic blood loss 06/29/2019   Lumbar post-laminectomy syndrome    Myocardial infarction [facility])    Pulmonary embolism ([place]) 2009   hx of   RBBB (right bundle branch block)    Renal artery stenosis (HCC)    Thrombocytopenia (HCC)    Thyroid disease    HYPOTHYROIDISM   Type 2 diabetes mellitus without complication, without long-term current use of insulin ([place]) 07/28/2019   Unstable angina (HCC)    NONE IN LONG TIME    Family History  Problem Relation Age of Onset   Lung cancer Father 55       deceased    Cancer Father 47       LUNG   Stroke Mother 79       DECEASED    Hyperlipidemia Mother    Heart attack Brother 82       deceased   Diabetes Brother    Liver cancer Sister 9       deceased    Cancer Sister     Past Surgical History:  Procedure Laterality Date   Blood clot  Feb.20,  2016   Pulmonary Embolism   CARDIOVERSION N/A 03/15/2019   Procedure: CARDIOVERSION;   Surgeon: Pixie Casino, MD;  Location: [place];  Service: Cardiovascular;  Laterality: N/A;   CAROTID ENDARTERECTOMY Right Oct. 17, 2013   CE   CAROTID ENDARTERECTOMY Left Dec. 3, 2016   CE   CATARACT EXTRACTION  05/2014,06/2014   COLONOSCOPY WITH PROPOFOL N/A 05/20/2019   Procedure: COLONOSCOPY WITH PROPOFOL;  Surgeon: [name]., MD;  Location[facility] ENDOSCOPY;  Service: Gastroenterology;  Laterality: N/A;   CORONARY ARTERY BYPASS GRAFT  11-21-09  EMERGENT X 3 GRAFTING. ..Carrina Schoenberger Prescott Gum, MD, cc: [name]   ENDARTERECTOMY  01/28/2012   Procedure: ENDARTERECTOMY CAROTID;  Surgeon: [name], MD;  Location: [place];  Service: Vascular;  Laterality: Right;  with Primary Closure of Artery   ENDARTERECTOMY  03/15/2012   Procedure: ENDARTERECTOMY CAROTID;  Surgeon: [name], MD;  Location: [place];  Service: Vascular;  Laterality: Left;   ENTEROSCOPY N/A 05/23/2019   Procedure: ENTEROSCOPY;  Surgeon: [name], MD;  Location: [place];  Service: Endoscopy;  Laterality: N/A;   ESOPHAGOGASTRODUODENOSCOPY N/A 05/20/2019   Procedure: ESOPHAGOGASTRODUODENOSCOPY (EGD);  Surgeon: [name]., MD;  Location: [place];  Service: Gastroenterology;  Laterality: N/A;   ESOPHAGOGASTRODUODENOSCOPY (EGD) WITH PROPOFOL N/A 05/19/2019   Procedure: ESOPHAGOGASTRODUODENOSCOPY (EGD) WITH PROPOFOL;  Surgeon: [name], MD;  Location: [place];  Service: Endoscopy;  Laterality: N/A;   FOOT SURGERY     GIVENS CAPSULE STUDY N/A 05/20/2019   Procedure: GIVENS CAPSULE STUDY;  Surgeon: [name]., MD;  Location: [place];  Service: Gastroenterology;  Laterality: N/A;   HOT HEMOSTASIS N/A 05/23/2019   Procedure: HOT HEMOSTASIS (ARGON PLASMA COAGULATION/BICAP);  Surgeon: [name], MD;  Location: [place];  Service: Endoscopy;  Laterality: N/A;   MANDIBLE SURGERY     MAXIMUM ACCESS (MAS)POSTERIOR LUMBAR INTERBODY FUSION (PLIF) 2  LEVEL N/A 08/08/2015   Procedure: Lumbar Four-Five, Lumbar Five-Sacral One Maximum access posterior lumbar interbody fusion;  Surgeon: [name], MD;  Location: [place] NEURO ORS;  Service: Neurosurgery;  Laterality: N/A;  LUMBAR FOUR-FIVE ,LUMBAR FIVE -SACRAL Maximum access posterior lumbar interbody fusion   POLYPECTOMY  05/20/2019   Procedure: POLYPECTOMY;  Surgeon: [name], Telford Nab., MD;  Location: [place];  Service: Gastroenterology;;   RENAL ARTERY STENT  2010   REPLACEMENT TOTAL KNEE  2006   RIGHT   SPINAL CORD STIMULATOR INSERTION N/A 06/18/2017   Procedure: LUMBAR SPINAL CORD STIMULATOR INSERTION;  Surgeon: [name], MD;  Location: [place];  Service: Neurosurgery;  Laterality: N/A;  LUMBAR SPINAL CORD STIMULATOR INSERTION   TOTAL HIP ARTHROPLASTY     right   Social History   Occupational History   Occupation: RETIRED     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, [place]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  Tobacco Use   Smoking status: Former    Types: Cigarettes    Quit date: 04/13/1974    Years since quitting: 47.9   Smokeless tobacco: Former    Types: Chew    Quit date: 01/26/2002   Tobacco comments:    He has smoked about 27-pack-year hx   Vaping Use   Vaping Use: Never used  Substance and Sexual Activity   Alcohol use: Not Currently    Alcohol/week: 2.0 standard drinks of alcohol    Types: 2 Cans of beer per week    Comment: He used to drink more heavily , but rarely drinks at the current time   Drug use: No   Sexual activity: Not on file     Garald Balding, MD   Note - This record has been created using [employer].  Chart creation errors have been sought, but may not always  have been located. Such creation errors do not reflect on  the standard of medical care.

## 2022-03-14 DIAGNOSIS — M542 Cervicalgia: Secondary | ICD-10-CM | POA: Diagnosis not present

## 2022-03-14 DIAGNOSIS — M4316 Spondylolisthesis, lumbar region: Secondary | ICD-10-CM | POA: Diagnosis not present

## 2022-03-14 DIAGNOSIS — M549 Dorsalgia, unspecified: Secondary | ICD-10-CM | POA: Diagnosis not present

## 2022-03-14 DIAGNOSIS — M1990 Unspecified osteoarthritis, unspecified site: Secondary | ICD-10-CM | POA: Diagnosis not present

## 2022-03-14 DIAGNOSIS — I110 Hypertensive heart disease with heart failure: Secondary | ICD-10-CM | POA: Diagnosis not present

## 2022-03-14 DIAGNOSIS — E782 Mixed hyperlipidemia: Secondary | ICD-10-CM | POA: Diagnosis not present

## 2022-03-14 DIAGNOSIS — M25511 Pain in right shoulder: Secondary | ICD-10-CM | POA: Diagnosis not present

## 2022-03-14 DIAGNOSIS — G8929 Other chronic pain: Secondary | ICD-10-CM | POA: Diagnosis not present

## 2022-03-14 DIAGNOSIS — I701 Atherosclerosis of renal artery: Secondary | ICD-10-CM | POA: Diagnosis not present

## 2022-03-14 DIAGNOSIS — S42125D Nondisplaced fracture of acromial process, left shoulder, subsequent encounter for fracture with routine healing: Secondary | ICD-10-CM | POA: Diagnosis not present

## 2022-03-14 DIAGNOSIS — E119 Type 2 diabetes mellitus without complications: Secondary | ICD-10-CM | POA: Diagnosis not present

## 2022-03-14 DIAGNOSIS — I2510 Atherosclerotic heart disease of native coronary artery without angina pectoris: Secondary | ICD-10-CM | POA: Diagnosis not present

## 2022-03-14 DIAGNOSIS — I5032 Chronic diastolic (congestive) heart failure: Secondary | ICD-10-CM | POA: Diagnosis not present

## 2022-03-14 DIAGNOSIS — E039 Hypothyroidism, unspecified: Secondary | ICD-10-CM | POA: Diagnosis not present

## 2022-03-14 DIAGNOSIS — I480 Paroxysmal atrial fibrillation: Secondary | ICD-10-CM | POA: Diagnosis not present

## 2022-03-14 DIAGNOSIS — I4510 Unspecified right bundle-branch block: Secondary | ICD-10-CM | POA: Diagnosis not present

## 2022-04-17 ENCOUNTER — Encounter: Payer: Self-pay | Admitting: Oncology

## 2022-04-22 DIAGNOSIS — D2272 Melanocytic nevi of left lower limb, including hip: Secondary | ICD-10-CM | POA: Diagnosis not present

## 2022-04-22 DIAGNOSIS — L853 Xerosis cutis: Secondary | ICD-10-CM | POA: Diagnosis not present

## 2022-04-22 DIAGNOSIS — Z85828 Personal history of other malignant neoplasm of skin: Secondary | ICD-10-CM | POA: Diagnosis not present

## 2022-04-22 DIAGNOSIS — X32XXXA Exposure to sunlight, initial encounter: Secondary | ICD-10-CM | POA: Diagnosis not present

## 2022-04-22 DIAGNOSIS — D2261 Melanocytic nevi of right upper limb, including shoulder: Secondary | ICD-10-CM | POA: Diagnosis not present

## 2022-04-22 DIAGNOSIS — L570 Actinic keratosis: Secondary | ICD-10-CM | POA: Diagnosis not present

## 2022-04-22 DIAGNOSIS — D0462 Carcinoma in situ of skin of left upper limb, including shoulder: Secondary | ICD-10-CM | POA: Diagnosis not present

## 2022-04-22 DIAGNOSIS — D485 Neoplasm of uncertain behavior of skin: Secondary | ICD-10-CM | POA: Diagnosis not present

## 2022-04-24 ENCOUNTER — Ambulatory Visit (INDEPENDENT_AMBULATORY_CARE_PROVIDER_SITE_OTHER): Payer: Medicare Other

## 2022-04-24 VITALS — Ht 74.0 in | Wt 215.0 lb

## 2022-04-24 DIAGNOSIS — Z0000 Encounter for general adult medical examination without abnormal findings: Secondary | ICD-10-CM | POA: Diagnosis not present

## 2022-04-24 NOTE — Patient Instructions (Addendum)
Leonard Berg , Thank you for taking time to come for your Medicare Wellness Visit. I appreciate your ongoing commitment to your health goals. Please review the following plan we discussed and let me know if I can assist you in the future.   These are the goals we discussed:    This is a list of the screening recommended for you and due dates:  Health Maintenance  Topic Date Due   Eye exam for diabetics  Never done   Hemoglobin A1C  05/10/2021   Zoster (Shingles) Vaccine (1 of 2) 07/24/2022*   Pneumonia Vaccine (2 - PCV) 04/25/2023*   Complete foot exam   12/23/2022   Medicare Annual Wellness Visit  04/25/2023   DTaP/Tdap/Td vaccine (2 - Td or Tdap) 01/21/2028   Flu Shot  Completed   HPV Vaccine  Aged Out   COVID-19 Vaccine  Discontinued  *Topic was postponed. The date shown is not the original due date.   Conditions/risks identified: none new  Next appointment: Follow up in one year for your annual wellness visit.   Preventive Care 52 Years and Older, Male  Preventive care refers to lifestyle choices and visits with your health care provider that can promote health and wellness. What does preventive care include? A yearly physical exam. This is also called an annual well check. Dental exams once or twice a year. Routine eye exams. Ask your health care provider how often you should have your eyes checked. Personal lifestyle choices, including: Daily care of your teeth and gums. Regular physical activity. Eating a healthy diet. Avoiding tobacco and drug use. Limiting alcohol use. Practicing safe sex. Taking low doses of aspirin every day. Taking vitamin and mineral supplements as recommended by your health care provider. What happens during an annual well check? The services and screenings done by your health care provider during your annual well check will depend on your age, overall health, lifestyle risk factors, and family history of disease. Counseling  Your health care  provider may ask you questions about your: Alcohol use. Tobacco use. Drug use. Emotional well-being. Home and relationship well-being. Sexual activity. Eating habits. History of falls. Memory and ability to understand (cognition). Work and work Statistician. Screening  You may have the following tests or measurements: Height, weight, and BMI. Blood pressure. Lipid and cholesterol levels. These may be checked every 5 years, or more frequently if you are over 78 years old. Skin check. Lung cancer screening. You may have this screening every year starting at age 44 if you have a 30-pack-year history of smoking and currently smoke or have quit within the past 15 years. Fecal occult blood test (FOBT) of the stool. You may have this test every year starting at age 85. Flexible sigmoidoscopy or colonoscopy. You may have a sigmoidoscopy every 5 years or a colonoscopy every 10 years starting at age 50. Prostate cancer screening. Recommendations will vary depending on your family history and other risks. Hepatitis C blood test. Hepatitis B blood test. Sexually transmitted disease (STD) testing. Diabetes screening. This is done by checking your blood sugar (glucose) after you have not eaten for a while (fasting). You may have this done every 1-3 years. Abdominal aortic aneurysm (AAA) screening. You may need this if you are a current or former smoker. Osteoporosis. You may be screened starting at age 7 if you are at high risk. Talk with your health care provider about your test results, treatment options, and if necessary, the need for more tests. Vaccines  Your health care provider may recommend certain vaccines, such as: Influenza vaccine. This is recommended every year. Tetanus, diphtheria, and acellular pertussis (Tdap, Td) vaccine. You may need a Td booster every 10 years. Zoster vaccine. You may need this after age 29. Pneumococcal 13-valent conjugate (PCV13) vaccine. One dose is  recommended after age 44. Pneumococcal polysaccharide (PPSV23) vaccine. One dose is recommended after age 77. Talk to your health care provider about which screenings and vaccines you need and how often you need them. This information is not intended to replace advice given to you by your health care provider. Make sure you discuss any questions you have with your health care provider. Document Released: 04/26/2015 Document Revised: 12/18/2015 Document Reviewed: 01/29/2015 Elsevier Interactive Patient Education  2017 [employer] Prevention in the Home Falls can cause injuries. They can happen to people of all ages. There are many things you can do to make your home safe and to help prevent falls. What can I do on the outside of my home? Regularly fix the edges of walkways and driveways and fix any cracks. Remove anything that might make you trip as you walk through a door, such as a raised step or threshold. Trim any bushes or trees on the path to your home. Use bright outdoor lighting. Clear any walking paths of anything that might make someone trip, such as rocks or tools. Regularly check to see if handrails are loose or broken. Make sure that both sides of any steps have handrails. Any raised decks and porches should have guardrails on the edges. Have any leaves, snow, or ice cleared regularly. Use sand or salt on walking paths during winter. Clean up any spills in your garage right away. This includes oil or grease spills. What can I do in the bathroom? Use night lights. Install grab bars by the toilet and in the tub and shower. Do not use towel bars as grab bars. Use non-skid mats or decals in the tub or shower. If you need to sit down in the shower, use a plastic, non-slip stool. Keep the floor dry. Clean up any water that spills on the floor as soon as it happens. Remove soap buildup in the tub or shower regularly. Attach bath mats securely with double-sided non-slip rug  tape. Do not have throw rugs and other things on the floor that can make you trip. What can I do in the bedroom? Use night lights. Make sure that you have a light by your bed that is easy to reach. Do not use any sheets or blankets that are too big for your bed. They should not hang down onto the floor. Have a firm chair that has side arms. You can use this for support while you get dressed. Do not have throw rugs and other things on the floor that can make you trip. What can I do in the kitchen? Clean up any spills right away. Avoid walking on wet floors. Keep items that you use a lot in easy-to-reach places. If you need to reach something above you, use a strong step stool that has a grab bar. Keep electrical cords out of the way. Do not use floor polish or wax that makes floors slippery. If you must use wax, use non-skid floor wax. Do not have throw rugs and other things on the floor that can make you trip. What can I do with my stairs? Do not leave any items on the stairs. Make sure that there are  handrails on both sides of the stairs and use them. Fix handrails that are broken or loose. Make sure that handrails are as long as the stairways. Check any carpeting to make sure that it is firmly attached to the stairs. Fix any carpet that is loose or worn. Avoid having throw rugs at the top or bottom of the stairs. If you do have throw rugs, attach them to the floor with carpet tape. Make sure that you have a light switch at the top of the stairs and the bottom of the stairs. If you do not have them, ask someone to add them for you. What else can I do to help prevent falls? Wear shoes that: Do not have high heels. Have rubber bottoms. Are comfortable and fit you well. Are closed at the toe. Do not wear sandals. If you use a stepladder: Make sure that it is fully opened. Do not climb a closed stepladder. Make sure that both sides of the stepladder are locked into place. Ask someone to  hold it for you, if possible. Clearly mark and make sure that you can see: Any grab bars or handrails. First and last steps. Where the edge of each step is. Use tools that help you move around (mobility aids) if they are needed. These include: Canes. Walkers. Scooters. Crutches. Turn on the lights when you go into a dark area. Replace any light bulbs as soon as they burn out. Set up your furniture so you have a clear path. Avoid moving your furniture around. If any of your floors are uneven, fix them. If there are any pets around you, be aware of where they are. Review your medicines with your doctor. Some medicines can make you feel dizzy. This can increase your chance of falling. Ask your doctor what other things that you can do to help prevent falls. This information is not intended to replace advice given to you by your health care provider. Make sure you discuss any questions you have with your health care provider. Document Released: 01/24/2009 Document Revised: 09/05/2015 Document Reviewed: 05/04/2014 Elsevier Interactive Patient Education  2017 [employer].

## 2022-04-24 NOTE — Progress Notes (Signed)
Subjective:   Tysheem Accardo Kotarski Sr. is a 89 y.o. male who presents for Medicare Annual/Subsequent preventive examination.  Review of Systems    No ROS.  Medicare Wellness Virtual Visit.  Visual/audio telehealth visit, UTA vital signs.   See social history for additional risk factors.   Cardiac Risk Factors include: advanced age (>8mn, >>58women)     Objective:    Today's Vitals   04/24/22 0847  Weight: 215 lb (97.5 kg)  Height: '6\' 2"'$  (1.88 m)   Body mass index is 27.6 kg/m.     04/24/2022    8:51 AM 04/22/2021    9:52 AM 09/24/2020   10:00 AM 10/11/2019   12:04 PM 09/29/2019    1:13 PM 06/28/2019    9:35 AM 05/19/2019   10:55 AM  Advanced Directives  Does Patient Have a Medical Advance Directive? Yes Yes Yes Yes Yes Yes No  Type of AIndustrial/product designerof Attorney Living will [place]kling ParkLiving will Living will;Healthcare Power of [place]Arc[place] will   Does patient want to make changes to medical advance directive? No - Patient declined Yes (MAU/Ambulatory/Procedural Areas - Information given) No - Patient declined      Copy of [place] Chart? No - copy requested   No - copy requested     Would patient like information on creating a medical advance directive?       No - Patient declined    Current Medications (verified) Outpatient Encounter Medications as of 04/24/2022  Medication Sig   amiodarone (PACERONE) 200 MG tablet TAKE 1 TABLET BY MOUTH ONCE DAILY   cetirizine (ZYRTEC) 10 MG tablet TAKE 1 TABLET BY MOUTH ONCE DAILY FOR ALLERGIES.   ferrous sulfate 324 MG TBEC Take 324 mg by mouth at bedtime.   furosemide (LASIX) 40 MG tablet Take 40 mg by mouth in the morning.   levothyroxine (SYNTHROID) 100 MCG tablet TAKE 1 TABLET BY MOUTH ON EMPTY STOMACH WITH WATER ONLY ON SUNDAY THROUGH FRIDAY. TAKE 1 & 1/2 TABS SATURDAY ONLY.NO FOOD OR MEDS FOR 30 MINUTES  AFTER.   LINZESS 145 MCG CAPS capsule TAKE ONE CAPSULE BY MOUTH EVERY OTHER DAY FOR CONSTIPATION   potassium chloride (KLOR-CON) 10 MEQ tablet Take 1 tablet (10 mEq total) by mouth daily.   Rivaroxaban (XARELTO) 15 MG TABS tablet Take 1 tablet (15 mg total) by mouth daily with supper. Office visit required for further refills.   rosuvastatin (CRESTOR) 10 MG tablet TAKE ONE TABLET BY MOUTH IN THE EVENING FOR CHOLESTEROL.   vitamin B-12 (CYANOCOBALAMIN) 1000 MCG tablet TAKE 1 TABLET BY MOUTH ONCE DAILY   No facility-administered encounter medications on file as of 04/24/2022.    Allergies (verified) Patient has no known allergies.   History: Past Medical History:  Diagnosis Date   Anemia    Anxiety    Arrhythmia    PAROXYSMAL ATRIAL FIBRILLATION   Arthritis    OSTEOARTHRITIS   Cataracts, bilateral    Chronic back pain    Coronary artery disease 8/11   CABG...LM EMERGENT WITH IABP sees Dr. MLegrand Comocooper   Dark brown urine 04/22/2020   DJD (degenerative joint disease)    DVT (deep venous thrombosis) (HCC)    Dyspnea    GERD (gastroesophageal reflux disease)    History of BPH    Hyperlipidemia    Hypertension    Hypothyroidism    Iron deficiency anemia due to chronic  blood loss 06/29/2019   Lumbar post-laminectomy syndrome    Myocardial infarction [facility])    Pulmonary embolism ([place]) 2009   hx of   RBBB (right bundle branch block)    Renal artery stenosis (HCC)    Thrombocytopenia ([place])    Thyroid disease    HYPOTHYROIDISM   Type 2 diabetes mellitus without complication, without long-term current use of insulin ([place]) 07/28/2019   Unstable angina ([place])    NONE IN LONG TIME   Past Surgical History:  Procedure Laterality Date   Blood clot  Feb.20,  2016   Pulmonary Embolism   CARDIOVERSION N/A 03/15/2019   Procedure: CARDIOVERSION;  Surgeon: Pixie Casino, MD;  Location: [place];  Service: Cardiovascular;  Laterality: N/A;   CAROTID ENDARTERECTOMY Right Oct. 17, 2013    CE   CAROTID ENDARTERECTOMY Left Dec. 3, 2016   CE   CATARACT EXTRACTION  05/2014,06/2014   COLONOSCOPY WITH PROPOFOL N/A 05/20/2019   Procedure: COLONOSCOPY WITH PROPOFOL;  Surgeon: [name]., MD;  Location: [facility];  Service: Gastroenterology;  Laterality: N/A;   CORONARY ARTERY BYPASS GRAFT  11-21-09   EMERGENT X 3 GRAFTING. ..[name], MD, cc: [name]   ENDARTERECTOMY  01/28/2012   Procedure: ENDARTERECTOMY CAROTID;  Surgeon: [name], MD;  Location: [place];  Service: Vascular;  Laterality: Right;  with Primary Closure of Artery   ENDARTERECTOMY  03/15/2012   Procedure: ENDARTERECTOMY CAROTID;  Surgeon: [name], MD;  Location: [place];  Service: Vascular;  Laterality: Left;   ENTEROSCOPY N/A 05/23/2019   Procedure: ENTEROSCOPY;  Surgeon: [name], MD;  Location: [place];  Service: Endoscopy;  Laterality: N/A;   ESOPHAGOGASTRODUODENOSCOPY N/A 05/20/2019   Procedure: ESOPHAGOGASTRODUODENOSCOPY (EGD);  Surgeon: [name]., MD;  Location: [place];  Service: Gastroenterology;  Laterality: N/A;   ESOPHAGOGASTRODUODENOSCOPY (EGD) WITH PROPOFOL N/A 05/19/2019   Procedure: ESOPHAGOGASTRODUODENOSCOPY (EGD) WITH PROPOFOL;  Surgeon: [name], MD;  Location: [facility];  Service: Endoscopy;  Laterality: N/A;   FOOT SURGERY     GIVENS CAPSULE STUDY N/A 05/20/2019   Procedure: GIVENS CAPSULE STUDY;  Surgeon: [name]., MD;  Location: [place];  Service: Gastroenterology;  Laterality: N/A;   HOT HEMOSTASIS N/A 05/23/2019   Procedure: HOT HEMOSTASIS (ARGON PLASMA COAGULATION/BICAP);  Surgeon: [name], MD;  Location: [place];  Service: Endoscopy;  Laterality: N/A;   MANDIBLE SURGERY     MAXIMUM ACCESS (MAS)POSTERIOR LUMBAR INTERBODY FUSION (PLIF) 2 LEVEL N/A 08/08/2015   Procedure: Lumbar Four-Five, Lumbar Five-Sacral One Maximum access posterior lumbar interbody fusion;  Surgeon: [name],  MD;  Location: [place] [facility];  Service: Neurosurgery;  Laterality: N/A;  LUMBAR FOUR-FIVE ,LUMBAR FIVE -SACRAL Maximum access posterior lumbar interbody fusion   POLYPECTOMY  05/20/2019   Procedure: POLYPECTOMY;  Surgeon: [name], Telford Nab., MD;  Location: [place];  Service: Gastroenterology;;   RENAL ARTERY STENT  2010   REPLACEMENT TOTAL KNEE  2006   RIGHT   SPINAL CORD STIMULATOR INSERTION N/A 06/18/2017   Procedure: LUMBAR SPINAL CORD STIMULATOR INSERTION;  Surgeon: [name], MD;  Location: [place];  Service: Neurosurgery;  Laterality: N/A;  LUMBAR SPINAL CORD STIMULATOR INSERTION   TOTAL HIP ARTHROPLASTY     right   Family History  Problem Relation Age of Onset   Lung cancer Father 80       deceased    Cancer Father 83       LUNG   Stroke Mother 40  DECEASED    Hyperlipidemia Mother    Heart attack Brother 66       deceased   Diabetes Brother    Liver cancer Sister 61       deceased    Cancer Sister    Social History   Socioeconomic History   Marital status: Widowed    Spouse name: Not on file   Number of children: 2   Years of education: Not on file   Highest education level: Not on file  Occupational History   Occupation: RETIRED     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, [place]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  Tobacco Use   Smoking status: Former    Types: Cigarettes    Quit date: 04/13/1974    Years since quitting: 48.0   Smokeless tobacco: Former    Types: Chew    Quit date: 01/26/2002   Tobacco comments:    He has smoked about 27-pack-year hx   Vaping Use   Vaping Use: Never used  Substance and Sexual Activity   Alcohol use: Not Currently    Alcohol/week: 2.0 standard drinks of alcohol    Types: 2 Cans of beer per week    Comment: He used to drink more heavily , but rarely drinks at the current time   Drug use: No   Sexual activity: Not on file  Other Topics Concern   Not on file  Social History Narrative   [place].   HE HAS 2 GROWN CHILDREN   HE IS RETIRED PRISON FIRM SUPERINTENDENT.   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   HE GOES TO THE [employer] 3 X WEEKLY.   Social Determinants of Health   Financial Resource Strain: Low Risk  (04/24/2022)   Overall Financial Resource Strain (CARDIA)    Difficulty of Paying Living Expenses: Not hard at all  Food Insecurity: No Food Insecurity (04/24/2022)   Hunger Vital Sign    Worried About Running Out of Food in the Last Year: Never true    Ran Out of Food in the Last Year: Never true  Transportation Needs: No Transportation Needs (04/24/2022)   PRAPARE - Hydrologist (Medical): No    Lack of Transportation (Non-Medical): No  Physical Activity: Insufficiently Active (04/24/2022)   Exercise Vital Sign    Days of Exercise per Week: 3 days    Minutes of Exercise per Session: 10 min  Stress: No Stress Concern Present (04/24/2022)   [place]    Feeling of Stress : Not at all  Social Connections: Unknown (04/24/2022)   Social Connection and Isolation Panel [NHANES]    Frequency of Communication with Friends and Family: Never    Frequency of Social Gatherings with Friends and Family: More than three times a week    Attends Religious Services: Not on file    Active Member of Clubs or Organizations: No    Attends Archivist Meetings: Never    Marital Status: Widowed    Tobacco Counseling Counseling given: Not Answered Tobacco comments: He has smoked about 27-pack-year hx    Clinical Intake:  Pre-visit preparation completed: Yes        Diabetes: No  How often do you need to have someone help you when you read instructions, pamphlets, or other written materials from your doctor or pharmacy?: 1 - Never  Interpreter Needed?: No      Activities of Daily Living    04/24/2022    8:52 AM 04/20/2022     8:27 PM  In your present state of health, do you have any difficulty performing the following activities:  Hearing? 1 1  Vision? 0 0  Difficulty concentrating or making decisions? 1 1  Comment Age appropriate   Walking or climbing stairs? 1 1  Comment Walker in use   Dressing or bathing? 0 0  Doing errands, shopping? 1 1  Comment Family Diplomatic Services operational officer and eating ? N N  Using the Toilet? N N  In the past six months, have you accidently leaked urine? N N  Do you have problems with loss of bowel control? N N  Managing your Medications? N N  Managing your Finances? N N  Housekeeping or managing your Housekeeping? N N    Patient Care Team: [name], NP as PCP - General (Internal Medicine) [name], MD as PCP - Cardiology (Cardiology) Earlie Server, MD as Consulting Physician (Hematology and Oncology) Jen Mow, [place] as Physician Assistant (Nephrology) Charlton Haws, [facility] as Pharmacist (Pharmacist)  Indicate any recent Medical Services you may have received from other than [facility] providers in the past year (date may be approximate).     Assessment:   This is a routine wellness examination for [place].  I connected with  Ugochukwu Chichester Schaner Sr. on 04/24/22 by a audio enabled telemedicine application and verified that I am speaking with the correct person using two identifiers.  Patient Location: Home  Provider Location: Office/Clinic  I discussed the limitations of evaluation and management by telemedicine. The patient expressed understanding and agreed to proceed.   Hearing/Vision screen Hearing Screening - Comments:: Patient is able to hear conversational tones without difficulty.  No issues reported.   Vision Screening - Comments:: Advised patient to get annual eye exams  Dietary issues and exercise activities discussed: Current Exercise Habits: Home exercise routine, Type of exercise: stretching;walking, Time (Minutes): 10, Frequency  (Times/Week): 3, Weekly Exercise (Minutes/Week): 30, Intensity: Mild   Goals Addressed             This Visit's Progress    Patient Stated       Would like to maintain current routine.       Depression Screen    04/24/2022    8:50 AM 04/22/2021    9:57 AM 11/07/2020    9:34 AM 10/11/2019   12:06 PM 11/19/2015    8:48 AM  PHQ 2/9 Scores  PHQ - 2 Score 0 1 0 0 0  PHQ- 9 Score    0     Fall Risk    04/24/2022    8:52 AM 04/20/2022    8:27 PM 12/29/2021    9:50 AM 04/22/2021    9:55 AM 03/11/2020    8:48 AM  Fall Risk   Falls in the past year? '1 1 1 1 1  '$ Number falls in past yr:  0 0 0 1  Injury with Fall?  '1 1 1 '$ 0  Comment    broke right femur   Risk for fall due to :    Impaired balance/gait Impaired balance/gait  Follow up Falls evaluation completed;Falls prevention discussed;Education provided   Falls prevention discussed     FALL RISK PREVENTION PERTAINING TO THE HOME: Home free of loose throw rugs in walkways, pet beds, electrical cords, etc? Yes  Adequate lighting in your home  to reduce risk of falls? Yes   ASSISTIVE DEVICES UTILIZED TO PREVENT FALLS: Use of a cane, walker or w/c? Yes   TIMED UP AND GO: Was the test performed? No .   Cognitive Function: Patient is alert and oriented x3.      10/11/2019   12:10 PM  MMSE - Mini Mental State Exam  Not completed: Unable to complete        04/24/2022    9:16 AM  6CIT Screen  What Year? 0 points  What month? 0 points  What time? 0 points   Immunizations Immunization History  Administered Date(s) Administered   Fluad Quad(high Dose 65+) 12/29/2021   Influenza Split 01/29/2012   Influenza, High Dose Seasonal PF 02/11/2021   Influenza-Unspecified 02/16/2017, 02/17/2018, 02/29/2020   PFIZER(Purple Top)SARS-COV-2 Vaccination 05/27/2019, 06/18/2019, 02/29/2020   Pneumococcal Polysaccharide-23 01/29/2012, 06/04/2014, 08/10/2015   Tdap 01/20/2018   Zoster, Live 02/18/2016   Pneumococcal vaccine status: Due,  Education has been provided regarding the importance of this vaccine. Advised may receive this vaccine at local pharmacy or Health Dept. Aware to provide a copy of the vaccination record if obtained from local pharmacy or Health Dept. Verbalized acceptance and understanding.  Shingrix Completed?: No.    Education has been provided regarding the importance of this vaccine. Patient has been advised to call insurance company to determine out of pocket expense if they have not yet received this vaccine. Advised may also receive vaccine at local pharmacy or Health Dept. Verbalized acceptance and understanding.  Screening Tests Health Maintenance  Topic Date Due   OPHTHALMOLOGY EXAM  Never done   HEMOGLOBIN A1C  05/10/2021   Zoster Vaccines- Shingrix (1 of 2) 07/24/2022 (Originally 04/03/1949)   Pneumonia Vaccine 60+ Years old (2 - PCV) 04/25/2023 (Originally 08/09/2016)   FOOT EXAM  12/23/2022   Medicare Annual Wellness (AWV)  04/25/2023   DTaP/Tdap/Td (2 - Td or Tdap) 01/21/2028   INFLUENZA VACCINE  Completed   HPV VACCINES  Aged Out   COVID-19 Vaccine  Discontinued   Health Maintenance Health Maintenance Due  Topic Date Due   OPHTHALMOLOGY EXAM  Never done   HEMOGLOBIN A1C  05/10/2021   Lung Cancer Screening: (Low Dose CT Chest recommended if Age 22-80 years, 30 pack-year currently smoking OR have quit w/in 15years.) does not qualify.   Hepatitis C Screening: Completed 10/2020.  Vision Screening: Recommended annual ophthalmology exams for early detection of glaucoma and other disorders of the eye.  Dental Screening: Recommended annual dental exams for proper oral hygiene.  Community Resource Referral / Chronic Care Management: CRR required this visit?  No   CCM required this visit?  No      Plan:     I have personally reviewed and noted the following in the patient's chart:   Medical and social history Use of alcohol, tobacco or illicit drugs  Current medications and  supplements including opioid prescriptions. Patient is not currently taking opioid prescriptions. Functional ability and status Nutritional status Physical activity Advanced directives List of other physicians Hospitalizations, surgeries, and ER visits in previous 12 months Vitals Screenings to include cognitive, depression, and falls Referrals and appointments  In addition, I have reviewed and discussed with patient certain preventive protocols, quality metrics, and best practice recommendations. A written personalized care plan for preventive services as well as general preventive health recommendations were provided to patient.     Leta Jungling, LPN   11/21/9145

## 2022-05-01 ENCOUNTER — Other Ambulatory Visit: Payer: Self-pay | Admitting: Primary Care

## 2022-05-01 DIAGNOSIS — I4891 Unspecified atrial fibrillation: Secondary | ICD-10-CM

## 2022-05-17 ENCOUNTER — Encounter: Payer: Self-pay | Admitting: Oncology

## 2022-05-19 DIAGNOSIS — N189 Chronic kidney disease, unspecified: Secondary | ICD-10-CM | POA: Diagnosis not present

## 2022-05-19 DIAGNOSIS — N2581 Secondary hyperparathyroidism of renal origin: Secondary | ICD-10-CM | POA: Diagnosis not present

## 2022-05-19 DIAGNOSIS — I129 Hypertensive chronic kidney disease with stage 1 through stage 4 chronic kidney disease, or unspecified chronic kidney disease: Secondary | ICD-10-CM | POA: Diagnosis not present

## 2022-05-19 DIAGNOSIS — N1832 Chronic kidney disease, stage 3b: Secondary | ICD-10-CM | POA: Diagnosis not present

## 2022-05-19 LAB — BASIC METABOLIC PANEL
BUN: 24 — AB (ref 4–21)
CO2: 27 — AB (ref 13–22)
Chloride: 103 (ref 99–108)
Creatinine: 1.8 — AB (ref 0.6–1.3)
Glucose: 97
Potassium: 4.4 mEq/L (ref 3.5–5.1)
Sodium: 141 (ref 137–147)

## 2022-05-19 LAB — COMPREHENSIVE METABOLIC PANEL
Albumin: 3.8 (ref 3.5–5.0)
Calcium: 9 (ref 8.7–10.7)
eGFR: 35

## 2022-05-19 LAB — IRON,TIBC AND FERRITIN PANEL
%SAT: 35
Ferritin: 311
Iron: 89
TIBC: 255
UIBC: 166

## 2022-05-19 LAB — CBC: RBC: 4.5 (ref 3.87–5.11)

## 2022-05-19 LAB — CBC AND DIFFERENTIAL
HCT: 44 (ref 41–53)
Hemoglobin: 14.8 (ref 13.5–17.5)
Neutrophils Absolute: 2.9
Platelets: 175 10*3/uL (ref 150–400)
WBC: 6.8

## 2022-05-19 LAB — VITAMIN D 25 HYDROXY (VIT D DEFICIENCY, FRACTURES): Vit D, 25-Hydroxy: 23.2

## 2022-05-19 LAB — PROTEIN / CREATININE RATIO, URINE: Creatinine, Urine: 46.3

## 2022-05-20 DIAGNOSIS — L570 Actinic keratosis: Secondary | ICD-10-CM | POA: Diagnosis not present

## 2022-05-20 DIAGNOSIS — D0462 Carcinoma in situ of skin of left upper limb, including shoulder: Secondary | ICD-10-CM | POA: Diagnosis not present

## 2022-05-21 ENCOUNTER — Ambulatory Visit: Payer: Medicare Other | Admitting: Podiatry

## 2022-05-29 ENCOUNTER — Encounter: Payer: Self-pay | Admitting: Primary Care

## 2022-06-04 ENCOUNTER — Ambulatory Visit: Payer: Medicare Other | Admitting: Podiatry

## 2022-07-29 ENCOUNTER — Other Ambulatory Visit: Payer: Self-pay | Admitting: Physician Assistant

## 2022-08-17 ENCOUNTER — Other Ambulatory Visit: Payer: Self-pay | Admitting: Physician Assistant

## 2022-09-12 ENCOUNTER — Other Ambulatory Visit: Payer: Self-pay | Admitting: Physician Assistant

## 2022-09-25 ENCOUNTER — Other Ambulatory Visit: Payer: Self-pay | Admitting: Physician Assistant

## 2022-10-14 ENCOUNTER — Other Ambulatory Visit: Payer: Self-pay | Admitting: Physician Assistant

## 2022-11-10 ENCOUNTER — Other Ambulatory Visit: Payer: Self-pay | Admitting: Cardiovascular Disease

## 2022-11-11 DIAGNOSIS — D2271 Melanocytic nevi of right lower limb, including hip: Secondary | ICD-10-CM | POA: Diagnosis not present

## 2022-11-11 DIAGNOSIS — L570 Actinic keratosis: Secondary | ICD-10-CM | POA: Diagnosis not present

## 2022-11-11 DIAGNOSIS — D2272 Melanocytic nevi of left lower limb, including hip: Secondary | ICD-10-CM | POA: Diagnosis not present

## 2022-11-11 DIAGNOSIS — X32XXXA Exposure to sunlight, initial encounter: Secondary | ICD-10-CM | POA: Diagnosis not present

## 2022-11-11 DIAGNOSIS — D225 Melanocytic nevi of trunk: Secondary | ICD-10-CM | POA: Diagnosis not present

## 2022-11-11 DIAGNOSIS — D2261 Melanocytic nevi of right upper limb, including shoulder: Secondary | ICD-10-CM | POA: Diagnosis not present

## 2022-11-21 ENCOUNTER — Other Ambulatory Visit: Payer: Self-pay | Admitting: Primary Care

## 2022-11-21 ENCOUNTER — Other Ambulatory Visit: Payer: Self-pay | Admitting: Cardiovascular Disease

## 2022-11-21 DIAGNOSIS — I4891 Unspecified atrial fibrillation: Secondary | ICD-10-CM

## 2022-11-22 NOTE — Telephone Encounter (Signed)
 Patient is due for CPE/follow up in late September, this will be required prior to any further refills.  Please schedule, thank you!

## 2022-11-23 NOTE — Telephone Encounter (Signed)
Patient has been scheduled

## 2022-11-26 ENCOUNTER — Encounter (INDEPENDENT_AMBULATORY_CARE_PROVIDER_SITE_OTHER): Payer: Self-pay

## 2022-12-02 DIAGNOSIS — I129 Hypertensive chronic kidney disease with stage 1 through stage 4 chronic kidney disease, or unspecified chronic kidney disease: Secondary | ICD-10-CM | POA: Diagnosis not present

## 2022-12-02 DIAGNOSIS — N2581 Secondary hyperparathyroidism of renal origin: Secondary | ICD-10-CM | POA: Diagnosis not present

## 2022-12-02 DIAGNOSIS — N189 Chronic kidney disease, unspecified: Secondary | ICD-10-CM | POA: Diagnosis not present

## 2022-12-02 DIAGNOSIS — N1832 Chronic kidney disease, stage 3b: Secondary | ICD-10-CM | POA: Diagnosis not present

## 2022-12-03 LAB — LAB REPORT - SCANNED: EGFR: 35

## 2022-12-17 ENCOUNTER — Encounter: Payer: Self-pay | Admitting: Cardiovascular Disease

## 2022-12-17 ENCOUNTER — Other Ambulatory Visit: Payer: Self-pay | Admitting: Primary Care

## 2022-12-17 ENCOUNTER — Ambulatory Visit: Payer: Medicare Other | Attending: Cardiovascular Disease | Admitting: Cardiovascular Disease

## 2022-12-17 VITALS — BP 124/60 | HR 56 | Ht 74.0 in | Wt 201.0 lb

## 2022-12-17 DIAGNOSIS — I480 Paroxysmal atrial fibrillation: Secondary | ICD-10-CM

## 2022-12-17 DIAGNOSIS — J309 Allergic rhinitis, unspecified: Secondary | ICD-10-CM

## 2022-12-17 DIAGNOSIS — I5032 Chronic diastolic (congestive) heart failure: Secondary | ICD-10-CM | POA: Diagnosis not present

## 2022-12-17 DIAGNOSIS — I10 Essential (primary) hypertension: Secondary | ICD-10-CM

## 2022-12-17 DIAGNOSIS — I2510 Atherosclerotic heart disease of native coronary artery without angina pectoris: Secondary | ICD-10-CM

## 2022-12-17 DIAGNOSIS — I6523 Occlusion and stenosis of bilateral carotid arteries: Secondary | ICD-10-CM

## 2022-12-17 MED ORDER — AMIODARONE HCL 200 MG PO TABS
200.0000 mg | ORAL_TABLET | Freq: Every day | ORAL | 3 refills | Status: DC
Start: 2022-12-17 — End: 2024-02-09

## 2022-12-17 MED ORDER — POTASSIUM CHLORIDE ER 10 MEQ PO TBCR
10.0000 meq | EXTENDED_RELEASE_TABLET | Freq: Every day | ORAL | 3 refills | Status: DC
Start: 2022-12-17 — End: 2023-11-26

## 2022-12-17 MED ORDER — FUROSEMIDE 40 MG PO TABS
40.0000 mg | ORAL_TABLET | Freq: Every morning | ORAL | 3 refills | Status: DC
Start: 2022-12-17 — End: 2024-03-06

## 2022-12-17 NOTE — Patient Instructions (Signed)
Medication Instructions:  Your physician recommends that you continue on your current medications as directed. Please refer to the Current Medication list given to you today. *If you need a refill on your cardiac medications before your next appointment, please call your pharmacy*  Lab Work: CMET, TSH, CBC, Free T4 If you have labs (blood work) drawn today and your tests are completely normal, you will receive your results only by: MyChart Message (if you have MyChart) OR A paper copy in the mail If you have any lab test that is abnormal or we need to change your treatment, we will call you to review the results.  Testing/Procedures: NONE  Follow-Up: At [facility], you and your health needs are our priority.  As part of our continuing mission to provide you with exceptional heart care, we have created designated Provider Care Teams.  These Care Teams include your primary Cardiologist (physician) and Advanced Practice Providers (APPs -  Physician Assistants and Nurse Practitioners) who all work together to provide you with the care you need, when you need it.  Your next appointment:   1 year(s)  Provider:   Tonny Bollman, MD

## 2022-12-17 NOTE — Progress Notes (Signed)
Cardiology Office Note:    Date:  12/17/2022   ID:  Leonard Pares Sr., DOB 09-May-1929, MRN [id number]  PCP:  Doreene Nest, NP   [facility] Providers Cardiologist:  Tonny Bollman, MD     Referring MD: Doreene Nest, NP   Chief Complaint  Patient presents with   Atrial Fibrillation    History of Present Illness:    Leonard Yacko Sr. is a 89 y.o. male with a hx of:  Coronary artery disease  S/p CABG in 2011 Paroxysmal atrial fibrillation  Rx - Amiodarone  HFimpEF (heart failure with improved ejection fraction)  Tachycardia induced CM  Echocardiogram 02/2019: EF 30-35 Echocardiogram 3/22: EF 60-65 Hypertension  Hyperlipidemia  Diabetes mellitus  Peripheral arterial disease  S/p RA stenting in 2012 Carotid artery disease S/p bilateral CEAs in 2013 Hx of recurrent pulmonary embolism on chronic anticoagulation  Hx of CVA CT in 6/22: bilateral cerebellar lacunar infarcts  Hx of GI bleed in 2021 req'd PRBCs  GERD Hypothyroidism  Right Bundle Branch Block    The patient is here with his son today.  He complains of gait unsteadiness as his primary problem.  He has had a few falls but no major bleeding issues.  He denies any chest pain or shortness of breath.  The patient is very hard of hearing.  He is still able to live alone and his son checks on him a few times each day.  He still gets out in the yard and does some work at times.  He is compliant with his medications.  He denies edema, orthopnea, or PND.  No heart palpitations.  Prior CV Studies: Echocardiogram 06/26/20 EF 60-65, no RWMA, mild LVH, Gr 1 DD, mildly reduced RVSF, AV sclerosis w/o AS, mild dilation of aortic root (40 mm)   Echocardiogram 02/28/2019 EF 30-35   Carotid US 09/22/17 Patent bilateral CEAs   Past Medical History:  Diagnosis Date   Anemia    Anxiety    Arrhythmia    PAROXYSMAL ATRIAL FIBRILLATION   Arthritis    OSTEOARTHRITIS   Cataracts,  bilateral    Chronic back pain    Coronary artery disease 8/11   CABG...LM EMERGENT WITH IABP sees Dr. Casimiro Needle Ellarie Picking   Dark brown urine 04/22/2020   DJD (degenerative joint disease)    DVT (deep venous thrombosis) (HCC)    Dyspnea    GERD (gastroesophageal reflux disease)    History of BPH    Hyperlipidemia    Hypertension    Hypothyroidism    Iron deficiency anemia due to chronic blood loss 06/29/2019   Lumbar post-laminectomy syndrome    Myocardial infarction [facility])    Pulmonary embolism (HCC) 2009   hx of   RBBB (right bundle branch block)    Renal artery stenosis (HCC)    Thrombocytopenia (HCC)    Thyroid disease    HYPOTHYROIDISM   Type 2 diabetes mellitus without complication, without long-term current use of insulin (HCC) 07/28/2019   Unstable angina (HCC)    NONE IN LONG TIME    Past Surgical History:  Procedure Laterality Date   Blood clot  Feb.20,  2016   Pulmonary Embolism   CARDIOVERSION N/A 03/15/2019   Procedure: CARDIOVERSION;  Surgeon: Chrystie Nose, MD;  Location: [facility] ENDOSCOPY;  Service: Cardiovascular;  Laterality: N/A;   CAROTID ENDARTERECTOMY Right Oct. 17, 2013   CE   CAROTID ENDARTERECTOMY Left Dec. 3, 2016   CE   CATARACT EXTRACTION  05/2014,06/2014  COLONOSCOPY WITH PROPOFOL N/A 05/20/2019   Procedure: COLONOSCOPY WITH PROPOFOL;  Surgeon: Meridee Score Netty Starring., MD;  Location: [facility] ENDOSCOPY;  Service: Gastroenterology;  Laterality: N/A;   CORONARY ARTERY BYPASS GRAFT  11-21-09   EMERGENT X 3 GRAFTING. ..PETER Donata Clay, MD, cc: [name]   ENDARTERECTOMY  01/28/2012   Procedure: ENDARTERECTOMY CAROTID;  Surgeon: Chuck Hint, MD;  Location[facility] OR;  Service: Vascular;  Laterality: Right;  with Primary Closure of Artery   ENDARTERECTOMY  03/15/2012   Procedure: ENDARTERECTOMY CAROTID;  Surgeon: Chuck Hint, MD;  Location: [facility] OR;  Service: Vascular;  Laterality: Left;   ENTEROSCOPY N/A 05/23/2019   Procedure: ENTEROSCOPY;   Surgeon: Jeani Hawking, MD;  Location: [facility] ENDOSCOPY;  Service: Endoscopy;  Laterality: N/A;   ESOPHAGOGASTRODUODENOSCOPY N/A 05/20/2019   Procedure: ESOPHAGOGASTRODUODENOSCOPY (EGD);  Surgeon: Lemar Lofty., MD;  Location: [facility] ENDOSCOPY;  Service: Gastroenterology;  Laterality: N/A;   ESOPHAGOGASTRODUODENOSCOPY (EGD) WITH PROPOFOL N/A 05/19/2019   Procedure: ESOPHAGOGASTRODUODENOSCOPY (EGD) WITH PROPOFOL;  Surgeon: Jeani Hawking, MD;  Location: [facility] ENDOSCOPY;  Service: Endoscopy;  Laterality: N/A;   FOOT SURGERY     GIVENS CAPSULE STUDY N/A 05/20/2019   Procedure: GIVENS CAPSULE STUDY;  Surgeon: Lemar Lofty., MD;  Location: [facility] ENDOSCOPY;  Service: Gastroenterology;  Laterality: N/A;   HOT HEMOSTASIS N/A 05/23/2019   Procedure: HOT HEMOSTASIS (ARGON PLASMA COAGULATION/BICAP);  Surgeon: Jeani Hawking, MD;  Location: [facility] ENDOSCOPY;  Service: Endoscopy;  Laterality: N/A;   MANDIBLE SURGERY     MAXIMUM ACCESS (MAS)POSTERIOR LUMBAR INTERBODY FUSION (PLIF) 2 LEVEL N/A 08/08/2015   Procedure: Lumbar Four-Five, Lumbar Five-Sacral One Maximum access posterior lumbar interbody fusion;  Surgeon: Maeola Harman, MD;  Location: [facility] NEURO ORS;  Service: Neurosurgery;  Laterality: N/A;  LUMBAR FOUR-FIVE ,LUMBAR FIVE -SACRAL Maximum access posterior lumbar interbody fusion   POLYPECTOMY  05/20/2019   Procedure: POLYPECTOMY;  Surgeon: [name], Netty Starring., MD;  Location: [facility] ENDOSCOPY;  Service: Gastroenterology;;   RENAL ARTERY STENT  2010   REPLACEMENT TOTAL KNEE  2006   RIGHT   SPINAL CORD STIMULATOR INSERTION N/A 06/18/2017   Procedure: LUMBAR SPINAL CORD STIMULATOR INSERTION;  Surgeon: Odette Fraction, MD;  Location: [facility] OR;  Service: Neurosurgery;  Laterality: N/A;  LUMBAR SPINAL CORD STIMULATOR INSERTION   TOTAL HIP ARTHROPLASTY     right    Current Medications: Current Meds  Medication Sig   amiodarone (PACERONE) 200 MG tablet TAKE ONE TABLET BY MOUTH ONCE DAILY   cetirizine (ZYRTEC) 10 MG tablet  TAKE 1 TABLET BY MOUTH ONCE DAILY FOR ALLERGIES.   ferrous sulfate 324 MG TBEC Take 324 mg by mouth at bedtime.   furosemide (LASIX) 40 MG tablet Take 40 mg by mouth in the morning.   levothyroxine (SYNTHROID) 100 MCG tablet TAKE 1 TABLET BY MOUTH ON EMPTY STOMACH WITH WATER ONLY ON SUNDAY THROUGH FRIDAY. TAKE 1 & 1/2 TABS SATURDAY ONLY.NO FOOD OR MEDS FOR 30 MINUTES AFTER.   LINZESS 145 MCG CAPS capsule TAKE ONE CAPSULE BY MOUTH EVERY OTHER DAY FOR CONSTIPATION   potassium chloride (KLOR-CON) 10 MEQ tablet Take 1 tablet (10 mEq total) by mouth daily. Please schedule an overdue appointment for further refills and 90 day supply. Thank you 2nd attempt   Rivaroxaban (XARELTO) 15 MG TABS tablet TAKE ONE TABLET BY MOUTH ONCE A DAY WITH SUPPER   rosuvastatin (CRESTOR) 10 MG tablet TAKE ONE TABLET BY MOUTH IN THE EVENING FOR CHOLESTEROL.   vitamin B-12 (CYANOCOBALAMIN) 1000 MCG tablet TAKE 1 TABLET  BY MOUTH ONCE DAILY     Allergies:   Patient has no known allergies.   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  Tobacco Use   Smoking status: Former    Current packs/day: 0.00    Types: Cigarettes    Quit date: 04/13/1974    Years since quitting: 48.7   Smokeless tobacco: Former    Types: Chew    Quit date: 01/26/2002   Tobacco comments:    He has smoked about 27-pack-year hx   Vaping Use   Vaping status: Never Used  Substance and Sexual Activity   Alcohol use: Not Currently    Alcohol/week: 2.0 standard drinks of alcohol    Types: 2 Cans of beer per week    Comment: He used to drink more heavily , but rarely drinks at the current time   Drug use: No   Sexual activity: Not on file  Other Topics Concern   Not on file   Social History Narrative   LIVES IN [place] WITH HIS WIFE.   HE HAS 2 GROWN CHILDREN   HE IS RETIRED PRISON FIRM SUPERINTENDENT.   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   HE GOES TO THE [employer] 3 X WEEKLY.   Social Determinants of Health   Financial Resource Strain: Low Risk  (04/24/2022)   Overall Financial Resource Strain (CARDIA)    Difficulty of Paying Living Expenses: Not hard at all  Food Insecurity: No Food Insecurity (04/24/2022)   Hunger Vital Sign    Worried About Running Out of Food in the Last Year: Never true    Ran Out of Food in the Last Year: Never true  Transportation Needs: No Transportation Needs (04/24/2022)   PRAPARE - Administrator, Civil Service (Medical): No    Lack of Transportation (Non-Medical): No  Physical Activity: Insufficiently Active (04/24/2022)   Exercise Vital Sign    Days of Exercise per Week: 3 days    Minutes of Exercise per Session: 10 min  Stress: No Stress Concern Present (04/24/2022)   [employer] of Occupational Health - Occupational Stress Questionnaire    Feeling of Stress : Not at all  Social Connections: Unknown (04/24/2022)   Social Connection and Isolation Panel [NHANES]    Frequency of Communication with Friends and Family: Never    Frequency of Social Gatherings with Friends and Family: More than three times a week    Attends Religious Services: Not on file    Active Member of Clubs or Organizations: No    Attends Banker Meetings: Never    Marital Status: Widowed     Family History: The patient's family history includes Cancer in his sister; Cancer (age of onset: 12) in his father; Diabetes in his brother; Heart attack (age of onset: 21) in his brother; Hyperlipidemia in his mother; Liver cancer (age of onset: 68) in his sister; Lung cancer (age of onset: 15) in his father; Stroke (age of onset: 4) in his mother.  ROS:   Please see the history of present  illness.    All other systems reviewed and  are negative.  EKGs/Labs/Other Studies Reviewed:         Recent Labs: 12/29/2021: TSH 4.45 05/19/2022: BUN 24; Creatinine 1.8; Hemoglobin 14.8; Platelets 175; Potassium 4.4; Sodium 141  Recent Lipid Panel    Component Value Date/Time   CHOL 129 07/28/2019 0844   TRIG 93.0 07/28/2019 0844   HDL 40.50 07/28/2019 0844   CHOLHDL 3 07/28/2019 0844   VLDL 18.6 07/28/2019 0844   LDLCALC 70 07/28/2019 0844   LDLDIRECT 100.8 08/12/2010 1523     Risk Assessment/Calculations:    CHA2DS2-VASc Score =     This indicates a  % annual risk of stroke. The patient's score is based upon:                Physical Exam:    VS:  BP 124/60   Pulse (!) 56   Ht 6\' 2"  (1.88 m)   Wt 201 lb (91.2 kg)   SpO2 96%   BMI 25.81 kg/m     Wt Readings from Last 3 Encounters:  12/17/22 201 lb (91.2 kg)  04/24/22 215 lb (97.5 kg)  12/29/21 215 lb (97.5 kg)     GEN:  Well nourished, well developed elderly male, hard of hearing, in no acute distress HEENT: Normal NECK: No JVD; No carotid bruits LYMPHATICS: No lymphadenopathy CARDIAC: RRR, no murmurs, rubs, gallops RESPIRATORY:  Clear to auscultation without rales, wheezing or rhonchi  ABDOMEN: Soft, non-tender, non-distended MUSCULOSKELETAL:  No edema; No deformity  SKIN: Warm and dry, scattered ecchymoses NEUROLOGIC:  Alert and oriented x 3 PSYCHIATRIC:  Normal affect   ASSESSMENT:    1. HFimpEF (heart failure with improved EF)   2. PAF (paroxysmal atrial fibrillation) (HCC)   3. Coronary artery disease involving native coronary artery of native heart without angina pectoris   4. Essential hypertension   5. Bilateral carotid artery stenosis    PLAN:    In order of problems listed above:  The patient appears clinically stable on his current medical regimen.  He has no evidence of volume overload and no current symptoms of heart failure.  Continue current management. Maintaining sinus rhythm  on amiodarone.  Today's EKG is personally reviewed and demonstrates sinus rhythm.  Will check thyroid and liver studies today for amiodarone monitoring.  Continue oral anticoagulation with dose reduced rivaroxaban based on his creatinine clearance less than 50. Stable, no antiplatelet therapy because of chronic oral anticoagulation. Blood pressure is well-controlled. Tolerating rosuvastatin, last lipids with an LDL cholesterol of 70.      Overall the patient appears clinically stable from a cardiovascular perspective.  I will plan to see him back in 1 year.  I am going to check thyroid and liver functions today as part of amiodarone monitoring.   Medication Adjustments/Labs and Tests Ordered: Current medicines are reviewed at length with the patient today.  Concerns regarding medicines are outlined above.  Orders Placed This Encounter  Procedures   EKG 12-Lead   No orders of the defined types were placed in this encounter.   There are no Patient Instructions on file for this visit.   Signed, Tonny Bollman, MD  12/17/2022 11:54 AM    [facility]

## 2022-12-18 LAB — COMPREHENSIVE METABOLIC PANEL
ALT: 17 IU/L (ref 0–44)
AST: 24 IU/L (ref 0–40)
Albumin: 3.8 g/dL (ref 3.6–4.6)
Alkaline Phosphatase: 96 IU/L (ref 44–121)
BUN/Creatinine Ratio: 13 (ref 10–24)
BUN: 22 mg/dL (ref 10–36)
Bilirubin Total: 0.5 mg/dL (ref 0.0–1.2)
CO2: 23 mmol/L (ref 20–29)
Calcium: 9 mg/dL (ref 8.6–10.2)
Chloride: 105 mmol/L (ref 96–106)
Creatinine, Ser: 1.63 mg/dL — ABNORMAL HIGH (ref 0.76–1.27)
Globulin, Total: 2.3 g/dL (ref 1.5–4.5)
Glucose: 133 mg/dL — ABNORMAL HIGH (ref 70–99)
Potassium: 4.1 mmol/L (ref 3.5–5.2)
Sodium: 144 mmol/L (ref 134–144)
Total Protein: 6.1 g/dL (ref 6.0–8.5)
eGFR: 40 mL/min/{1.73_m2} — ABNORMAL LOW (ref >59)

## 2022-12-18 LAB — CBC
Hematocrit: 46.6 % (ref 37.5–51.0)
Hemoglobin: 15.1 g/dL (ref 13.0–17.7)
MCH: 32.9 pg (ref 26.6–33.0)
MCHC: 32.4 g/dL (ref 31.5–35.7)
MCV: 102 fL — ABNORMAL HIGH (ref 79–97)
Platelets: 177 10*3/uL (ref 150–450)
RBC: 4.59 x10E6/uL (ref 4.14–5.80)
RDW: 11.9 % (ref 11.6–15.4)
WBC: 6.9 10*3/uL (ref 3.4–10.8)

## 2022-12-18 LAB — T4, FREE: Free T4: 1.42 ng/dL (ref 0.82–1.77)

## 2022-12-18 LAB — TSH: TSH: 1.03 u[IU]/mL (ref 0.450–4.500)

## 2022-12-31 ENCOUNTER — Encounter: Payer: Self-pay | Admitting: Primary Care

## 2022-12-31 ENCOUNTER — Ambulatory Visit: Payer: Medicare Other | Admitting: Primary Care

## 2022-12-31 VITALS — BP 134/86 | HR 60 | Temp 97.3°F | Ht 74.0 in | Wt 202.0 lb

## 2022-12-31 DIAGNOSIS — Z23 Encounter for immunization: Secondary | ICD-10-CM

## 2022-12-31 DIAGNOSIS — I10 Essential (primary) hypertension: Secondary | ICD-10-CM

## 2022-12-31 DIAGNOSIS — I5032 Chronic diastolic (congestive) heart failure: Secondary | ICD-10-CM | POA: Diagnosis not present

## 2022-12-31 DIAGNOSIS — E1165 Type 2 diabetes mellitus with hyperglycemia: Secondary | ICD-10-CM

## 2022-12-31 DIAGNOSIS — M159 Polyosteoarthritis, unspecified: Secondary | ICD-10-CM

## 2022-12-31 DIAGNOSIS — Z86711 Personal history of pulmonary embolism: Secondary | ICD-10-CM

## 2022-12-31 DIAGNOSIS — I2510 Atherosclerotic heart disease of native coronary artery without angina pectoris: Secondary | ICD-10-CM

## 2022-12-31 DIAGNOSIS — R739 Hyperglycemia, unspecified: Secondary | ICD-10-CM

## 2022-12-31 DIAGNOSIS — E038 Other specified hypothyroidism: Secondary | ICD-10-CM

## 2022-12-31 DIAGNOSIS — Z0000 Encounter for general adult medical examination without abnormal findings: Secondary | ICD-10-CM | POA: Diagnosis not present

## 2022-12-31 DIAGNOSIS — I701 Atherosclerosis of renal artery: Secondary | ICD-10-CM

## 2022-12-31 DIAGNOSIS — M150 Primary generalized (osteo)arthritis: Secondary | ICD-10-CM

## 2022-12-31 DIAGNOSIS — K5900 Constipation, unspecified: Secondary | ICD-10-CM

## 2022-12-31 DIAGNOSIS — I480 Paroxysmal atrial fibrillation: Secondary | ICD-10-CM

## 2022-12-31 DIAGNOSIS — N1832 Chronic kidney disease, stage 3b: Secondary | ICD-10-CM

## 2022-12-31 DIAGNOSIS — E782 Mixed hyperlipidemia: Secondary | ICD-10-CM

## 2022-12-31 LAB — POCT GLYCOSYLATED HEMOGLOBIN (HGB A1C): Hemoglobin A1C: 5.5 % (ref 4.0–5.6)

## 2022-12-31 NOTE — Assessment & Plan Note (Signed)
Following with nephrology. Continue blood pressure control.

## 2022-12-31 NOTE — Progress Notes (Signed)
Subjective:    Patient ID: Leonard Pares Sr., male    DOB: [birth year], 89 y.o.   MRN: [id number]  HPI  Leonard Berg [facility] Sr. is a very pleasant 89 y.o. male who presents today for complete physical and follow up of chronic conditions.  His son joins [place] today.  Immunizations: -Tetanus: Completed in 2019 -Influenza: Influenza vaccine provided today. -Shingles: Completed Zostavax -Pneumonia: Last completed in 2017  Diet: Fair diet.  Exercise: No regular exercise.  Eye exam: Completes annually  Dental exam: Completes semi-annually    Colonoscopy: Completed in 2021   Wt Readings from Last 3 Encounters:  12/31/22 202 lb (91.6 kg)  12/17/22 201 lb (91.2 kg)  04/24/22 215 lb (97.5 kg)    BP Readings from Last 3 Encounters:  12/31/22 134/86  12/17/22 124/60  12/29/21 130/72     Review of Systems  Constitutional:  Negative for unexpected weight change.  HENT:  Negative for rhinorrhea.   Respiratory:  Negative for cough and shortness of breath.   Cardiovascular:  Negative for chest pain.  Gastrointestinal:  Positive for constipation. Negative for diarrhea.  Genitourinary:  Negative for difficulty urinating.  Musculoskeletal:  Positive for arthralgias.  Skin:  Negative for rash.  Allergic/Immunologic: Negative for environmental allergies.  Neurological:  Negative for dizziness and headaches.  Psychiatric/Behavioral:  The patient is not nervous/anxious.          Past Medical History:  Diagnosis Date   Anemia    Anxiety    Arrhythmia    PAROXYSMAL ATRIAL FIBRILLATION   Arthritis    OSTEOARTHRITIS   Cataracts, bilateral    Chronic back pain    Coronary artery disease 11/2009   CABG...LM EMERGENT WITH IABP sees Dr. Casimiro Needle cooper   Dark brown urine 04/22/2020   DJD (degenerative joint disease)    DVT (deep venous thrombosis) (HCC)    Dyspnea    GERD (gastroesophageal reflux disease)    GIB (gastrointestinal bleeding) 05/18/2019   History of  BPH    Hyperlipidemia    Hypertension    Hypothyroidism    Iron deficiency anemia due to chronic blood loss 06/29/2019   Lumbar post-laminectomy syndrome    Myocardial infarction [facility])    Pulmonary embolism (HCC) 2009   hx of   RBBB (right bundle branch block)    Renal artery stenosis (HCC)    Thrombocytopenia (HCC)    Thyroid disease    HYPOTHYROIDISM   Type 2 diabetes mellitus without complication, without long-term current use of insulin (HCC) 07/28/2019   Unstable angina (HCC)    NONE IN LONG TIME    Social History   Socioeconomic History   Marital status: Widowed    Spouse name: Not on file   Number of children: 2   Years of education: Not on file   Highest education level: 12th grade  Occupational History   Occupation: RETIRED     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, 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  Tobacco Use   Smoking status: Former    Current packs/day: 0.00    Types: Cigarettes    Quit date: 04/13/1974    Years since quitting: 48.7   Smokeless tobacco: Former    Types: Chew    Quit date: 01/26/2002   Tobacco comments:    He has smoked about 27-pack-year hx   Vaping Use   Vaping status: Never Used  Substance and Sexual Activity   Alcohol use: Not Currently    Alcohol/week: 2.0 standard  drinks of alcohol    Types: 2 Cans of beer per week    Comment: He used to drink more heavily , but rarely drinks at the current time   Drug use: No   Sexual activity: Not on file  Other Topics Concern   Not on file  Social History Narrative   LIVES IN [place] WITH HIS WIFE.   HE HAS 2 GROWN CHILDREN   HE IS RETIRED PRISON FIRM SUPERINTENDENT.   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   HE GOES TO THE [employer] 3 X WEEKLY.   Social Determinants of Health   Financial Resource Strain: Low Risk  (12/29/2022)   Overall Financial Resource Strain (CARDIA)    Difficulty of Paying Living Expenses: Not hard at all  Food  Insecurity: No Food Insecurity (12/29/2022)   Hunger Vital Sign    Worried About Running Out of Food in the Last Year: Never true    Ran Out of Food in the Last Year: Never true  Transportation Needs: No Transportation Needs (12/29/2022)   PRAPARE - Administrator, Civil Service (Medical): No    Lack of Transportation (Non-Medical): No  Physical Activity: Inactive (12/29/2022)   Exercise Vital Sign    Days of Exercise per Week: 0 days    Minutes of Exercise per Session: 10 min  Stress: No Stress Concern Present (12/29/2022)   [employer] of Occupational Health - Occupational Stress Questionnaire    Feeling of Stress : Not at all  Social Connections: Socially Isolated (12/29/2022)   Social Connection and Isolation Panel [NHANES]    Frequency of Communication with Friends and Family: Never    Frequency of Social Gatherings with Friends and Family: More than three times a week    Attends Religious Services: Never    Database administrator or Organizations: No    Attends Banker Meetings: Never    Marital Status: Widowed  Intimate Partner Violence: Not At Risk (04/24/2022)   Humiliation, Afraid, Rape, and Kick questionnaire    Fear of Current or Ex-Partner: No    Emotionally Abused: No    Physically Abused: No    Sexually Abused: No    Past Surgical History:  Procedure Laterality Date   Blood clot  Feb.20,  2016   Pulmonary Embolism   CARDIOVERSION N/A 03/15/2019   Procedure: CARDIOVERSION;  Surgeon: Chrystie Nose, MD;  Location: [facility] ENDOSCOPY;  Service: Cardiovascular;  Laterality: N/A;   CAROTID ENDARTERECTOMY Right Oct. 17, 2013   CE   CAROTID ENDARTERECTOMY Left Dec. 3, 2016   CE   CATARACT EXTRACTION  05/2014,06/2014   COLONOSCOPY WITH PROPOFOL N/A 05/20/2019   Procedure: COLONOSCOPY WITH PROPOFOL;  Surgeon: Lemar Lofty., MD;  Location: [facility] ENDOSCOPY;  Service: Gastroenterology;  Laterality: N/A;   CORONARY ARTERY BYPASS GRAFT  11-21-09    EMERGENT X 3 GRAFTING. ..PETER Donata Clay, MD, cc: [name]   ENDARTERECTOMY  01/28/2012   Procedure: ENDARTERECTOMY CAROTID;  Surgeon: Chuck Hint, MD;  Location: [facility] OR;  Service: Vascular;  Laterality: Right;  with Primary Closure of Artery   ENDARTERECTOMY  03/15/2012   Procedure: ENDARTERECTOMY CAROTID;  Surgeon: Chuck Hint, MD;  Location: [facility] OR;  Service: Vascular;  Laterality: Left;   ENTEROSCOPY N/A 05/23/2019   Procedure: ENTEROSCOPY;  Surgeon: Jeani Hawking, MD;  Location[facility] ENDOSCOPY;  Service: Endoscopy;  Laterality: N/A;   ESOPHAGOGASTRODUODENOSCOPY N/A 05/20/2019  Procedure: ESOPHAGOGASTRODUODENOSCOPY (EGD);  Surgeon: Lemar Lofty., MD;  Location: [facility] ENDOSCOPY;  Service: Gastroenterology;  Laterality: N/A;   ESOPHAGOGASTRODUODENOSCOPY (EGD) WITH PROPOFOL N/A 05/19/2019   Procedure: ESOPHAGOGASTRODUODENOSCOPY (EGD) WITH PROPOFOL;  Surgeon: Jeani Hawking, MD;  Location: [facility] ENDOSCOPY;  Service: Endoscopy;  Laterality: N/A;   FOOT SURGERY     GIVENS CAPSULE STUDY N/A 05/20/2019   Procedure: GIVENS CAPSULE STUDY;  Surgeon: Lemar Lofty., MD;  Location: [facility] ENDOSCOPY;  Service: Gastroenterology;  Laterality: N/A;   HOT HEMOSTASIS N/A 05/23/2019   Procedure: HOT HEMOSTASIS (ARGON PLASMA COAGULATION/BICAP);  Surgeon: Jeani Hawking, MD;  Location: [facility] ENDOSCOPY;  Service: Endoscopy;  Laterality: N/A;   MANDIBLE SURGERY     MAXIMUM ACCESS (MAS)POSTERIOR LUMBAR INTERBODY FUSION (PLIF) 2 LEVEL N/A 08/08/2015   Procedure: Lumbar Four-Five, Lumbar Five-Sacral One Maximum access posterior lumbar interbody fusion;  Surgeon: Maeola Harman, MD;  Location: [facility] NEURO ORS;  Service: Neurosurgery;  Laterality: N/A;  LUMBAR FOUR-FIVE ,LUMBAR FIVE -SACRAL Maximum access posterior lumbar interbody fusion   POLYPECTOMY  05/20/2019   Procedure: POLYPECTOMY;  Surgeon: [name], Netty Starring., MD;  Location: [facility] ENDOSCOPY;  Service: Gastroenterology;;   RENAL ARTERY STENT  2010    REPLACEMENT TOTAL KNEE  2006   RIGHT   SPINAL CORD STIMULATOR INSERTION N/A 06/18/2017   Procedure: LUMBAR SPINAL CORD STIMULATOR INSERTION;  Surgeon: Odette Fraction, MD;  Location: [facility] OR;  Service: Neurosurgery;  Laterality: N/A;  LUMBAR SPINAL CORD STIMULATOR INSERTION   TOTAL HIP ARTHROPLASTY     right    Family History  Problem Relation Age of Onset   Lung cancer Father 31       deceased    Cancer Father 73       LUNG   Stroke Mother 1       DECEASED    Hyperlipidemia Mother    Heart attack Brother 51       deceased   Diabetes Brother    Liver cancer Sister 53       deceased    Cancer Sister     No Known Allergies  Current Outpatient Medications on File Prior to Visit  Medication Sig Dispense Refill   amiodarone (PACERONE) 200 MG tablet Take 1 tablet (200 mg total) by mouth daily. 90 tablet 3   cetirizine (ZYRTEC) 10 MG tablet TAKE ONE TABLET BY MOUTH ONCE DAILY FOR ALLERGIES. 90 tablet 0   ferrous sulfate 324 MG TBEC Take 324 mg by mouth at bedtime.     furosemide (LASIX) 40 MG tablet Take 1 tablet (40 mg total) by mouth in the morning. 90 tablet 3   levothyroxine (SYNTHROID) 100 MCG tablet TAKE 1 TABLET BY MOUTH ON EMPTY STOMACH WITH WATER ONLY ON SUNDAY THROUGH FRIDAY. TAKE 1 & 1/2 TABS SATURDAY ONLY.NO FOOD OR MEDS FOR 30 MINUTES AFTER. 94 tablet 3   LINZESS 145 MCG CAPS capsule TAKE ONE CAPSULE BY MOUTH EVERY OTHER DAY FOR CONSTIPATION 45 capsule 3   potassium chloride (KLOR-CON) 10 MEQ tablet Take 1 tablet (10 mEq total) by mouth daily. 90 tablet 3   Rivaroxaban (XARELTO) 15 MG TABS tablet TAKE ONE TABLET BY MOUTH ONCE A DAY WITH SUPPER 90 tablet 0   rosuvastatin (CRESTOR) 10 MG tablet TAKE ONE TABLET BY MOUTH IN THE EVENING FOR CHOLESTEROL. 90 tablet 2   vitamin B-12 (CYANOCOBALAMIN) 1000 MCG tablet TAKE 1 TABLET BY MOUTH ONCE DAILY 90 tablet 1   No current facility-administered medications on file prior to visit.  BP 134/86   Pulse 60   Temp (!) 97.3 F (36.3  C) (Temporal)   Ht 6\' 2"  (1.88 m)   Wt 202 lb (91.6 kg)   SpO2 95%   BMI 25.94 kg/m  Objective:   Physical Exam HENT:     Right Ear: Tympanic membrane and ear canal normal.     Left Ear: Tympanic membrane and ear canal normal.     Nose: Nose normal.     Right Sinus: No maxillary sinus tenderness or frontal sinus tenderness.     Left Sinus: No maxillary sinus tenderness or frontal sinus tenderness.  Eyes:     Conjunctiva/sclera: Conjunctivae normal.  Neck:     Thyroid: No thyromegaly.     Vascular: No carotid bruit.  Cardiovascular:     Rate and Rhythm: Normal rate and regular rhythm.     Heart sounds: Normal heart sounds.  Pulmonary:     Effort: Pulmonary effort is normal.     Breath sounds: Normal breath sounds. No wheezing or rales.  Abdominal:     General: Bowel sounds are normal.     Palpations: Abdomen is soft.     Tenderness: There is no abdominal tenderness.  Musculoskeletal:        General: Normal range of motion.     Cervical back: Neck supple.  Skin:    General: Skin is warm and dry.  Neurological:     Mental Status: He is alert and oriented to person, place, and time.     Cranial Nerves: No cranial nerve deficit.     Deep Tendon Reflexes: Reflexes are normal and symmetric.  Psychiatric:        Mood and Affect: Mood normal.           Assessment & Plan:  Hyperglycemia -     POCT glycosylated hemoglobin (Hb A1C)  Coronary artery disease involving native coronary artery of native heart without angina pectoris Assessment & Plan: Asymptomatic. Following with cardiology, office notes reviewed from September 2024.     Essential hypertension Assessment & Plan: Controlled. Continue to monitor.   HFimpEF (heart failure with improved EF) Assessment & Plan: Appears euvolemic today.  Following with cardiology, office notes reviewed from September 2024. Continue furosemide 40 mg daily and potassium chloride 10 mEq daily.  Reviewed CMP from  September 2024 per cardiology   PAF (paroxysmal atrial fibrillation) [facility]) Assessment & Plan: Follow with cardiology.  Continue amiodarone 200 mg daily, Xarelto 15 mg daily.   Renal artery atherosclerosis [facility]) Assessment & Plan: Following with nephrology. Continue blood pressure control.   Controlled type 2 diabetes mellitus with hyperglycemia, without long-term current use of insulin (HCC) Assessment & Plan: A1C today of 5.5.  Remain off of treatment. Continue to monitor.   Other specified hypothyroidism Assessment & Plan: Controlled. He appears to be taking levothyroxine correctly. Continue levothyroxine 100 mcg daily. Reviewed TSH from September 2024 per cardiology.   Encounter for immunization -     Flu Vaccine Trivalent High Dose (Fluad) -     Pneumococcal conjugate vaccine 20-valent  Primary osteoarthritis involving multiple joints Assessment & Plan: Chronic and continued.  Discussed that he can start Tylenol extra strength 500 mg daily. Encouraged to increase physical activity during the day.  Consider home health PT.  He declines today.   Stage 3b chronic kidney disease (HCC) Assessment & Plan: Stable.  Following with nephrology. Reviewed recent renal function from September 2024 per cardiology.   Constipation, unspecified constipation type Assessment &  Plan: Controlled.  Continue Linzess 145 mcg every other day   History of pulmonary embolism Assessment & Plan: Continue Xarelto 15 mg daily permanently.   Mixed hyperlipidemia Assessment & Plan: Following with cardiology.  Continue rosuvastatin 10 mg daily.   Preventative health care Assessment & Plan: Prevnar 20 and influenza vaccines provided today. Colonoscopy UTD, no further screening needed given age  Discussed the importance of a healthy diet and regular exercise in order for weight loss, and to reduce the risk of further co-morbidity.  Exam stable. Labs reviewed from  cardiology.  Follow up in 1 year for repeat physical.          Doreene Nest, NP

## 2022-12-31 NOTE — Assessment & Plan Note (Signed)
Follow with cardiology.  Continue amiodarone 200 mg daily, Xarelto 15 mg daily.

## 2022-12-31 NOTE — Assessment & Plan Note (Signed)
Controlled. He appears to be taking levothyroxine correctly. Continue levothyroxine 100 mcg daily. Reviewed TSH from September 2024 per cardiology.

## 2022-12-31 NOTE — Assessment & Plan Note (Signed)
Continue Xarelto 15 mg daily permanently.

## 2022-12-31 NOTE — Assessment & Plan Note (Signed)
Appears euvolemic today.  Following with cardiology, office notes reviewed from September 2024. Continue furosemide 40 mg daily and potassium chloride 10 mEq daily.  Reviewed CMP from September 2024 per cardiology

## 2022-12-31 NOTE — Assessment & Plan Note (Signed)
Controlled.  Continue Linzess 145 mcg every other day

## 2022-12-31 NOTE — Assessment & Plan Note (Signed)
Chronic and continued.  Discussed that he can start Tylenol extra strength 500 mg daily. Encouraged to increase physical activity during the day.  Consider home health PT.  He declines today.

## 2022-12-31 NOTE — Patient Instructions (Addendum)
You can take Tylenol Arthritis once or twice daily for your joint pain.   Try to increase your activity level by walking.  It was a pleasure to see you today!

## 2022-12-31 NOTE — Assessment & Plan Note (Signed)
A1C today of 5.5.  Remain off of treatment. Continue to monitor.

## 2022-12-31 NOTE — Assessment & Plan Note (Signed)
Following with cardiology. Continue rosuvastatin 10 mg daily.

## 2022-12-31 NOTE — Assessment & Plan Note (Signed)
Asymptomatic. Following with cardiology, office notes reviewed from September 2024.

## 2022-12-31 NOTE — Assessment & Plan Note (Signed)
Stable.  Following with nephrology. Reviewed recent renal function from September 2024 per cardiology.

## 2022-12-31 NOTE — Assessment & Plan Note (Signed)
Controlled.  Continue to monitor.

## 2022-12-31 NOTE — Assessment & Plan Note (Signed)
Prevnar 20 and influenza vaccines provided today. Colonoscopy UTD, no further screening needed given age  Discussed the importance of a healthy diet and regular exercise in order for weight loss, and to reduce the risk of further co-morbidity.  Exam stable. Labs reviewed from cardiology.  Follow up in 1 year for repeat physical.

## 2023-01-07 ENCOUNTER — Other Ambulatory Visit: Payer: Self-pay | Admitting: Primary Care

## 2023-01-07 DIAGNOSIS — E038 Other specified hypothyroidism: Secondary | ICD-10-CM

## 2023-02-05 ENCOUNTER — Other Ambulatory Visit: Payer: Self-pay | Admitting: Primary Care

## 2023-02-05 DIAGNOSIS — E785 Hyperlipidemia, unspecified: Secondary | ICD-10-CM

## 2023-02-11 ENCOUNTER — Other Ambulatory Visit: Payer: Self-pay | Admitting: Primary Care

## 2023-02-11 DIAGNOSIS — I4891 Unspecified atrial fibrillation: Secondary | ICD-10-CM

## 2023-02-24 ENCOUNTER — Other Ambulatory Visit: Payer: Self-pay | Admitting: Primary Care

## 2023-02-24 DIAGNOSIS — K5900 Constipation, unspecified: Secondary | ICD-10-CM

## 2023-04-16 ENCOUNTER — Encounter: Payer: Self-pay | Admitting: Podiatry

## 2023-04-16 ENCOUNTER — Ambulatory Visit: Payer: Medicare Other | Admitting: Podiatry

## 2023-04-16 DIAGNOSIS — M79674 Pain in right toe(s): Secondary | ICD-10-CM

## 2023-04-16 DIAGNOSIS — M79675 Pain in left toe(s): Secondary | ICD-10-CM | POA: Diagnosis not present

## 2023-04-16 DIAGNOSIS — B351 Tinea unguium: Secondary | ICD-10-CM | POA: Diagnosis not present

## 2023-04-16 NOTE — Patient Instructions (Signed)
 Urea nail gel available on [employer]

## 2023-04-16 NOTE — Progress Notes (Signed)
   Chief Complaint  Patient presents with   Toe Pain    His son stated, It's the right foot. He said his big toe hurts.  Cut his toenails too. N - painful toe  L - hallux right D - 1 week O - suddenly, about the same C - sharp pain A - touch, covers T - bengay    SUBJECTIVE Patient presents to office today complaining of elongated, thickened nails that cause pain while ambulating in shoes.  Patient is unable to trim their own nails. Patient is here for further evaluation and treatment.  Past Medical History:  Diagnosis Date   Anemia    Anxiety    Arrhythmia    PAROXYSMAL ATRIAL FIBRILLATION   Arthritis    OSTEOARTHRITIS   Cataracts, bilateral    Chronic back pain    Coronary artery disease 11/2009   CABG...LM EMERGENT WITH IABP sees Dr. Ozell cooper   Dark brown urine 04/22/2020   DJD (degenerative joint disease)    DVT (deep venous thrombosis) (HCC)    Dyspnea    GERD (gastroesophageal reflux disease)    GIB (gastrointestinal bleeding) 05/18/2019   History of BPH    Hyperlipidemia    Hypertension    Hypothyroidism    Iron  deficiency anemia due to chronic blood loss 06/29/2019   Lumbar post-laminectomy syndrome    Myocardial infarction [facility])    Pulmonary embolism (HCC) 2009   hx of   RBBB (right bundle branch block)    Renal artery stenosis (HCC)    Thrombocytopenia (HCC)    Thyroid  disease    HYPOTHYROIDISM   Type 2 diabetes mellitus without complication, without long-term current use of insulin (HCC) 07/28/2019   Unstable angina (HCC)    NONE IN LONG TIME    No Known Allergies   OBJECTIVE General Patient is awake, alert, and oriented x 3 and in no acute distress. Derm Skin is dry and supple bilateral. Negative open lesions or macerations. Remaining integument unremarkable. Nails are tender, long, thickened and dystrophic with subungual debris, consistent with onychomycosis, 1-5 bilateral. No signs of infection noted. Vasc  DP and PT pedal pulses  palpable bilaterally. Temperature gradient within normal limits.  Neuro Epicritic and protective threshold sensation grossly intact bilaterally.  Musculoskeletal Exam No symptomatic pedal deformities noted bilateral. Muscular strength within normal limits.  ASSESSMENT 1.  Pain due to onychomycosis of toenails both  PLAN OF CARE 1. Patient evaluated today.  2. Instructed to maintain good pedal hygiene and foot care.  3. Mechanical debridement of nails 1-5 bilaterally performed using a nail nipper. Filed with dremel without incident.  4. Return to clinic in 3 mos.    Thresa EMERSON Sar, DPM [facility]  Dr. Thresa EMERSON Sar, DPM    [address] [place], [place] [address]                Office [phone]  Fax [phone]

## 2023-04-26 ENCOUNTER — Ambulatory Visit (INDEPENDENT_AMBULATORY_CARE_PROVIDER_SITE_OTHER): Payer: Medicare Other | Admitting: *Deleted

## 2023-04-26 VITALS — Ht 74.0 in | Wt 202.0 lb

## 2023-04-26 DIAGNOSIS — Z0000 Encounter for general adult medical examination without abnormal findings: Secondary | ICD-10-CM

## 2023-04-26 NOTE — Progress Notes (Signed)
 Subjective:   Leonard Coppa Sr. is a 89 y.o. male who presents for Medicare Annual/Subsequent preventive examination.  Visit Complete: Virtual I connected with  Leonard Stick Thedford Sr. on 04/26/23 by a audio enabled telemedicine application and verified that I am speaking with the correct person using two identifiers. [name] Leonard Berg helped with the visit.  This patient declined Interactive audio and acupuncturist. Therefore the visit was completed with audio only.   Patient Location: Home  Provider Location: Office/Clinic  I discussed the limitations of evaluation and management by telemedicine. The patient expressed understanding and agreed to proceed.  Vital Signs: Because this visit was a virtual/telehealth visit, some criteria may be missing or patient reported. Any vitals not documented were not able to be obtained and vitals that have been documented are patient reported.  Patient Medicare AWV questionnaire was completed by the patient on 04/24/23; I have confirmed that all information answered by patient is correct and no changes since this date.  Cardiac Risk Factors include: advanced age (>79men, >37 women);diabetes mellitus;male gender;hypertension;dyslipidemia     Objective:    Today's Vitals   04/26/23 1033  Weight: 202 lb (91.6 kg)  Height: 6' 2 (1.88 m)   Body mass index is 25.94 kg/m.     04/26/2023   10:50 AM 04/24/2022    8:51 AM 04/22/2021    9:52 AM 09/24/2020   10:00 AM 10/11/2019   12:04 PM 09/29/2019    1:13 PM 06/28/2019    9:35 AM  Advanced Directives  Does Patient Have a Medical Advance Directive? Yes Yes Yes Yes Yes Yes Yes  Type of Estate Agent of [place];Living will Healthcare Power of [employer] Power of Attorney Living will Healthcare Power of [place];Living will Living will;Healthcare Power of [employer] Power of [place];Living will  Does patient want to make changes to  medical advance directive? No - Patient declined No - Patient declined Yes (MAU/Ambulatory/Procedural Areas - Information given) No - Patient declined     Copy of Healthcare Power of Attorney in Chart? Yes - validated most recent copy scanned in chart (See row information) No - copy requested   No - copy requested      Current Medications (verified) Outpatient Encounter Medications as of 04/26/2023  Medication Sig   amiodarone  (PACERONE ) 200 MG tablet Take 1 tablet (200 mg total) by mouth daily.   cetirizine (ZYRTEC) 10 MG tablet TAKE ONE TABLET BY MOUTH ONCE DAILY FOR ALLERGIES.   ferrous sulfate 324 MG TBEC Take 324 mg by mouth at bedtime.   furosemide  (LASIX ) 40 MG tablet Take 1 tablet (40 mg total) by mouth in the morning.   levothyroxine  (SYNTHROID ) 100 MCG tablet TAKE ONE TABLET BY MOUTH ON EMPTY STOMACH WITH WATER ONLY ON SUNDAY THROUGH FRIDAY. TAKE ONE AND ONE HALF (1/2) TABS SATURDAY ONLY. NO FOOD OR MEDS FOR 30 MINUTES AFTER.   LINZESS  145 MCG CAPS capsule Take 1 capsule (145 mcg total) by mouth every other day. TAKE ONE CAPSULE BY MOUTH ONCE DAILY   Rivaroxaban  (XARELTO ) 15 MG TABS tablet TAKE ONE TABLET BY MOUTH ONCE A DAY WITH SUPPER   rosuvastatin  (CRESTOR ) 10 MG tablet TAKE ONE TABLET BY MOUTH IN THE EVENING FOR CHOLESTEROL.   vitamin B-12 (CYANOCOBALAMIN ) 1000 MCG tablet TAKE 1 TABLET BY MOUTH ONCE DAILY   potassium chloride  (KLOR-CON ) 10 MEQ tablet Take 1 tablet (10 mEq total) by mouth daily. (Patient not taking: Reported on 04/26/2023)   No facility-administered encounter  medications on file as of 04/26/2023.    Allergies (verified) Patient has no known allergies.   History: Past Medical History:  Diagnosis Date   Anemia    Anxiety    Arrhythmia    PAROXYSMAL ATRIAL FIBRILLATION   Arthritis    OSTEOARTHRITIS   Cataracts, bilateral    Chronic back pain    Coronary artery disease 11/2009   CABG...LM EMERGENT WITH IABP sees Dr. Ozell cooper   Dark brown urine  04/22/2020   DJD (degenerative joint disease)    DVT (deep venous thrombosis) (HCC)    Dyspnea    GERD (gastroesophageal reflux disease)    GIB (gastrointestinal bleeding) 05/18/2019   History of BPH    Hyperlipidemia    Hypertension    Hypothyroidism    Iron  deficiency anemia due to chronic blood loss 06/29/2019   Lumbar post-laminectomy syndrome    Myocardial infarction [facility])    Pulmonary embolism (HCC) 2009   hx of   RBBB (right bundle branch block)    Renal artery stenosis (HCC)    Thrombocytopenia (HCC)    Thyroid  disease    HYPOTHYROIDISM   Type 2 diabetes mellitus without complication, without long-term current use of insulin (HCC) 07/28/2019   Unstable angina (HCC)    NONE IN LONG TIME   Past Surgical History:  Procedure Laterality Date   Blood clot  Feb.20,  2016   Pulmonary Embolism   CARDIOVERSION N/A 03/15/2019   Procedure: CARDIOVERSION;  Surgeon: Mona Vinie BROCKS, MD;  Location: [facility] ENDOSCOPY;  Service: Cardiovascular;  Laterality: N/A;   CAROTID ENDARTERECTOMY Right Oct. 17, 2013   CE   CAROTID ENDARTERECTOMY Left Dec. 3, 2016   CE   CATARACT EXTRACTION  05/2014,06/2014   COLONOSCOPY WITH PROPOFOL  N/A 05/20/2019   Procedure: COLONOSCOPY WITH PROPOFOL ;  Surgeon: Wilhelmenia Aloha Raddle., MD;  Location: [facility] ENDOSCOPY;  Service: Gastroenterology;  Laterality: N/A;   CORONARY ARTERY BYPASS GRAFT  11-21-09   EMERGENT X 3 GRAFTING. ..PETER FLEETA OCHOA, MD, cc: [name]   ENDARTERECTOMY  01/28/2012   Procedure: ENDARTERECTOMY CAROTID;  Surgeon: Lonni GORMAN Blade, MD;  Location: [facility] OR;  Service: Vascular;  Laterality: Right;  with Primary Closure of Artery   ENDARTERECTOMY  03/15/2012   Procedure: ENDARTERECTOMY CAROTID;  Surgeon: Lonni GORMAN Blade, MD;  Location: [facility] OR;  Service: Vascular;  Laterality: Left;   ENTEROSCOPY N/A 05/23/2019   Procedure: ENTEROSCOPY;  Surgeon: Rollin Dover, MD;  Location: [facility] ENDOSCOPY;  Service: Endoscopy;  Laterality: N/A;    ESOPHAGOGASTRODUODENOSCOPY N/A 05/20/2019   Procedure: ESOPHAGOGASTRODUODENOSCOPY (EGD);  Surgeon: Wilhelmenia Aloha Raddle., MD;  Location: [facility] ENDOSCOPY;  Service: Gastroenterology;  Laterality: N/A;   ESOPHAGOGASTRODUODENOSCOPY (EGD) WITH PROPOFOL  N/A 05/19/2019   Procedure: ESOPHAGOGASTRODUODENOSCOPY (EGD) WITH PROPOFOL ;  Surgeon: Rollin Dover, MD;  Location: [facility] ENDOSCOPY;  Service: Endoscopy;  Laterality: N/A;   FOOT SURGERY     GIVENS CAPSULE STUDY N/A 05/20/2019   Procedure: GIVENS CAPSULE STUDY;  Surgeon: Wilhelmenia Aloha Raddle., MD;  Location: [facility] ENDOSCOPY;  Service: Gastroenterology;  Laterality: N/A;   HOT HEMOSTASIS N/A 05/23/2019   Procedure: HOT HEMOSTASIS (ARGON PLASMA COAGULATION/BICAP);  Surgeon: Rollin Dover, MD;  Location: [facility] ENDOSCOPY;  Service: Endoscopy;  Laterality: N/A;   MANDIBLE SURGERY     MAXIMUM ACCESS (MAS)POSTERIOR LUMBAR INTERBODY FUSION (PLIF) 2 LEVEL N/A 08/08/2015   Procedure: Lumbar Four-Five, Lumbar Five-Sacral One Maximum access posterior lumbar interbody fusion;  Surgeon: Fairy Levels, MD;  Location: [facility] NEURO ORS;  Service: Neurosurgery;  Laterality: N/A;  LUMBAR  FOUR-FIVE ,LUMBAR FIVE -SACRAL Maximum access posterior lumbar interbody fusion   POLYPECTOMY  05/20/2019   Procedure: POLYPECTOMY;  Surgeon: [name], Aloha Raddle., MD;  Location: [facility] ENDOSCOPY;  Service: Gastroenterology;;   RENAL ARTERY STENT  2010   REPLACEMENT TOTAL KNEE  2006   RIGHT   SPINAL CORD STIMULATOR INSERTION N/A 06/18/2017   Procedure: LUMBAR SPINAL CORD STIMULATOR INSERTION;  Surgeon: Mindi Mt, MD;  Location: [facility] OR;  Service: Neurosurgery;  Laterality: N/A;  LUMBAR SPINAL CORD STIMULATOR INSERTION   TOTAL HIP ARTHROPLASTY     right   Family History  Problem Relation Age of Onset   Lung cancer Father 39       deceased    Cancer Father 54       LUNG   Stroke Mother 60       DECEASED    Hyperlipidemia Mother    Heart attack Brother 51       deceased   Diabetes Brother    Liver  cancer Sister 50       deceased    Cancer Sister    Social History   Socioeconomic History   Marital status: Widowed    Spouse name: Not on file   Number of children: 2   Years of education: Not on file   Highest education level: 12th grade  Occupational History   Occupation: RETIRED     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, 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  Tobacco Use   Smoking status: Former    Current packs/day: 0.00    Types: Cigarettes    Quit date: 04/13/1974    Years since quitting: 49.0   Smokeless tobacco: Former    Types: Chew    Quit date: 01/26/2002   Tobacco comments:    He has smoked about 27-pack-year hx   Vaping Use   Vaping status: Never Used  Substance and Sexual Activity   Alcohol use: Not Currently    Alcohol/week: 2.0 standard drinks of alcohol    Types: 2 Cans of beer per week    Comment: He used to drink more heavily , but rarely drinks at the current time   Drug use: No   Sexual activity: Not on file  Other Topics Concern   Not on file  Social History Narrative   LIVES IN [place] WITH HIS WIFE.   HE HAS 2 GROWN CHILDREN   HE IS RETIRED PRISON FIRM SUPERINTENDENT.   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   HE GOES TO THE [employer] 3 X WEEKLY.   Social Drivers of Corporate Investment Banker Strain: Low Risk  (04/26/2023)   Overall Financial Resource Strain (CARDIA)    Difficulty of Paying Living Expenses: Not hard at all  Food Insecurity: No Food Insecurity (04/26/2023)   Hunger Vital Sign    Worried About Running Out of Food in the Last Year: Never true    Ran Out of Food in the Last Year: Never true  Transportation Needs: No Transportation Needs (04/26/2023)   PRAPARE - Administrator, Civil Service (Medical): No    Lack of Transportation (Non-Medical): No  Physical Activity: Inactive (04/26/2023)   Exercise Vital Sign    Days of Exercise per Week: 0 days    Minutes of Exercise per  Session: 0 min  Stress: No Stress Concern Present (04/26/2023)   [employer] of Occupational Health - Occupational Stress Questionnaire  Feeling of Stress : Not at all  Social Connections: Socially Isolated (04/26/2023)   Social Connection and Isolation Panel [NHANES]    Frequency of Communication with Friends and Family: Never    Frequency of Social Gatherings with Friends and Family: More than three times a week    Attends Religious Services: Never    Database Administrator or Organizations: No    Attends Banker Meetings: Never    Marital Status: Widowed    Tobacco Counseling Counseling given: Not Answered Tobacco comments: He has smoked about 27-pack-year hx    Clinical Intake:  Pre-visit preparation completed: Yes  Pain : No/denies pain     BMI - recorded: 25.94 Nutritional Status: BMI 25 -29 Overweight Nutritional Risks: None Diabetes: Yes CBG done?: No Did pt. bring in CBG monitor from home?: No  How often do you need to have someone help you when you read instructions, pamphlets, or other written materials from your doctor or pharmacy?: 1 - Never  Interpreter Needed?: No  Information entered by :: R. Quinlan Mcfall LPN   Activities of Daily Living    04/26/2023   10:37 AM 04/24/2023    6:45 AM  In your present state of health, do you have any difficulty performing the following activities:  Hearing? 1 1   Comment has hearing aids   Vision? 0 0   Difficulty concentrating or making decisions? 0 0   Walking or climbing stairs? 1 1   Dressing or bathing? 1 1   Doing errands, shopping? 1 1   Preparing Food and eating ? Y Y   Using the Toilet? N N   In the past six months, have you accidently leaked urine? N N   Do you have problems with loss of bowel control? N N   Managing your Medications? Y Y   Managing your Finances? N N   Housekeeping or managing your Housekeeping? LOISE GRADE      Proxy-reported    Patient Care Team: Gretta Comer POUR, NP as  PCP - General (Internal Medicine) Wonda Sharper, MD as PCP - Cardiology (Cardiology) Babara Call, MD as Consulting Physician (Hematology and Oncology) Phares Shaver, [place] as Physician Assistant (Nephrology) Fate Morna LOISE, [facility] (Inactive) as Pharmacist (Pharmacist)  Indicate any recent Medical Services you may have received from other than [facility] providers in the past year (date may be approximate).     Assessment:   This is a routine wellness examination for [place].  Hearing/Vision screen Hearing Screening - Comments:: Has aids that he does not wear   Goals Addressed             This Visit's Progress    Patient Stated       Wants to stay active and walk        Depression Screen    04/26/2023   10:43 AM 12/31/2022    9:41 AM 04/24/2022    8:50 AM 04/22/2021    9:57 AM 11/07/2020    9:34 AM 10/11/2019   12:06 PM 11/19/2015    8:48 AM  PHQ 2/9 Scores  PHQ - 2 Score 0 0 0 1 0 0 0  PHQ- 9 Score 0     0     Fall Risk    04/26/2023   10:39 AM 04/24/2023    6:45 AM 12/31/2022    9:41 AM 04/24/2022    8:52 AM 04/20/2022    8:27 PM  Fall Risk   Falls in the past  year? 1 1  1 1 1    Number falls in past yr: 0 0  1  0   Injury with Fall? 0 0  0  1   Risk for fall due to : History of fall(s);Impaired balance/gait  History of fall(s);Impaired balance/gait    Follow up Falls evaluation completed;Falls prevention discussed  Falls evaluation completed Falls evaluation completed;Falls prevention discussed;Education provided      Proxy-reported    MEDICARE RISK AT HOME: Medicare Risk at Home Any stairs in or around the home?: (Proxy-Rptd) Yes If so, are there any without handrails?: (Proxy-Rptd) No Home free of loose throw rugs in walkways, pet beds, electrical cords, etc?: (Proxy-Rptd) Yes Adequate lighting in your home to reduce risk of falls?: (Proxy-Rptd) Yes Life alert?: (Proxy-Rptd) Yes Use of a cane, walker or w/c?: (Proxy-Rptd) Yes Grab bars in the bathroom?:  (Proxy-Rptd) Yes Shower chair or bench in shower?: (Proxy-Rptd) Yes Elevated toilet seat or a handicapped toilet?: (Proxy-Rptd) No      Cognitive Function:    10/11/2019   12:10 PM  MMSE - Mini Mental State Exam  Not completed: Unable to complete        04/26/2023   10:52 AM 04/24/2022    9:16 AM  6CIT Screen  What Year? 0 points 0 points  What month? 0 points 0 points  What time? 0 points 0 points  Count back from 20 0 points   Months in reverse 4 points   Repeat phrase 10 points   Total Score 14 points     Immunizations Immunization History  Administered Date(s) Administered   Fluad Quad(high Dose 65+) 12/29/2021   Fluad Trivalent(High Dose 65+) 12/31/2022   Influenza Split 01/29/2012   Influenza, High Dose Seasonal PF 02/11/2021   Influenza-Unspecified 02/16/2017, 02/17/2018, 02/29/2020   PFIZER(Purple Top)SARS-COV-2 Vaccination 05/27/2019, 06/18/2019, 02/29/2020   PNEUMOCOCCAL CONJUGATE-20 12/31/2022   Pneumococcal Polysaccharide-23 01/29/2012, 06/04/2014, 08/10/2015   Tdap 01/20/2018   Zoster, Live 02/18/2016    TDAP status: Up to date  Flu Vaccine status: Up to date  Pneumococcal vaccine status: Up to date  Covid-19 vaccine status: Information provided on how to obtain vaccines.   Qualifies for Shingles Vaccine? Yes   Zostavax completed Yes   Shingrix Completed?: No.    Education has been provided regarding the importance of this vaccine. Patient has been advised to call insurance company to determine out of pocket expense if they have not yet received this vaccine. Advised may also receive vaccine at local pharmacy or Health Dept. Verbalized acceptance and understanding.  Screening Tests Health Maintenance  Topic Date Due   OPHTHALMOLOGY EXAM  Never done   Zoster Vaccines- Shingrix (1 of 2) 04/03/1949   FOOT EXAM  12/23/2022   Medicare Annual Wellness (AWV)  04/25/2023   HEMOGLOBIN A1C  06/30/2023   DTaP/Tdap/Td (2 - Td or Tdap) 01/21/2028    Pneumonia Vaccine 79+ Years old  Completed   INFLUENZA VACCINE  Completed   HPV VACCINES  Aged Out   COVID-19 Vaccine  Discontinued    Health Maintenance  Health Maintenance Due  Topic Date Due   OPHTHALMOLOGY EXAM  Never done   Zoster Vaccines- Shingrix (1 of 2) 04/03/1949   FOOT EXAM  12/23/2022   Medicare Annual Wellness (AWV)  04/25/2023    Colorectal cancer screening: No longer required.   Lung Cancer Screening: (Low Dose CT Chest recommended if Age 69-80 years, 20 pack-year currently smoking OR have quit w/in 15years.) does not qualify.  Additional Screening:  Hepatitis C Screening: does not qualify; Completed NA age  Vision Screening: Recommended annual ophthalmology exams for early detection of glaucoma and other disorders of the eye. Is the patient up to date with their annual eye exam?  No  Who is the provider or what is the name of the office in which the patient attends annual eye exams? [name] will work on scheduling patient an eye appointment. If pt is not established with a provider, would they like to be referred to a provider to establish care? No .   Dental Screening: Recommended annual dental exams for proper oral hygiene  Diabetic Foot Exam: Diabetic Foot Exam: Overdue, Pt has been advised about the importance in completing this exam. Pt is scheduled for diabetic foot exam on Needs completed and documented at next office visit..  Community Resource Referral / Chronic Care Management: CRR required this visit?  No   CCM required this visit?  No     Plan:     I have personally reviewed and noted the following in the patient's chart:   Medical and social history Use of alcohol, tobacco or illicit drugs  Current medications and supplements including opioid prescriptions. Patient is not currently taking opioid prescriptions. Functional ability and status Nutritional status Physical activity Advanced directives List of other  physicians Hospitalizations, surgeries, and ER visits in previous 12 months Vitals Screenings to include cognitive, depression, and falls Referrals and appointments  In addition, I have reviewed and discussed with patient certain preventive protocols, quality metrics, and best practice recommendations. A written personalized care plan for preventive services as well as general preventive health recommendations were provided to patient.     Angeline Fredericks, LPN   [id number]   After Visit Summary: (MyChart) Due to this being a telephonic visit, the after visit summary with patients personalized plan was offered to patient via MyChart   Nurse Notes: None

## 2023-04-26 NOTE — Patient Instructions (Signed)
 [name] , Thank you for taking time to come for your Medicare Wellness Visit. I appreciate your ongoing commitment to your health goals. Please review the following plan we discussed and let me know if I can assist you in the future.   Referrals/Orders/Follow-Ups/Clinician Recommendations: Remember to schedule an diabetic eye exam.  This is a list of the screening recommended for you and due dates:  Health Maintenance  Topic Date Due   Eye exam for diabetics  Never done   Zoster (Shingles) Vaccine (1 of 2) 04/03/1949   Complete foot exam   12/23/2022   Hemoglobin A1C  06/30/2023   Medicare Annual Wellness Visit  04/25/2024   DTaP/Tdap/Td vaccine (2 - Td or Tdap) 01/21/2028   Pneumonia Vaccine  Completed   Flu Shot  Completed   HPV Vaccine  Aged Out   COVID-19 Vaccine  Discontinued    Advanced directives: (In Chart) A copy of your advanced directives are scanned into your chart should your provider ever need it.  Next Medicare Annual Wellness Visit scheduled for next year: Yes  04/27/24@ 9:30

## 2023-05-03 ENCOUNTER — Telehealth: Payer: Self-pay

## 2023-05-03 DIAGNOSIS — K5900 Constipation, unspecified: Secondary | ICD-10-CM

## 2023-05-03 MED ORDER — LUBIPROSTONE 24 MCG PO CAPS: 24.0000 ug | ORAL_CAPSULE | Freq: Two times a day (BID) | ORAL | 0 refills | Status: DC

## 2023-05-03 NOTE — Telephone Encounter (Signed)
Copied from CRM [id number]. Topic: Clinical - Prescription Issue >> May 03, 2023 10:43 AM Kathryne Eriksson wrote: Reason for CRM: LINZESS 145 MCG CAPS capsule >> May 03, 2023 10:48 AM Kathryne Eriksson wrote: [facility] calling on behalf of patient, states that the medication LINZESS 145 MCG CAPS capsule isn't covered by his insurance and is pretty expressive out of pocket. They're suggesting that Lubiprostone be sent in as the generic as it is fully covered and will save the patient tons of money.

## 2023-05-03 NOTE — Telephone Encounter (Signed)
Noted. New Rx sent to pharmacy. Please call patient's son to let him know we made the change.   The new Rx is for constipation, called lubiprostone and is taken BID. He doesn't have to take it everyday if it's not needed.

## 2023-05-03 NOTE — Telephone Encounter (Signed)
Called patients son and reviewed all information. He verbalized understanding. Will call if any further questions.

## 2023-05-27 ENCOUNTER — Emergency Department: Payer: Medicare Other

## 2023-05-27 ENCOUNTER — Emergency Department
Admission: EM | Admit: 2023-05-27 | Discharge: 2023-05-27 | Disposition: A | Discharge status: Home / Self Care | Payer: Medicare Other | Attending: Student in an Organized Health Care Education/Training Program | Admitting: Student in an Organized Health Care Education/Training Program

## 2023-05-27 ENCOUNTER — Other Ambulatory Visit: Payer: Self-pay

## 2023-05-27 DIAGNOSIS — E039 Hypothyroidism, unspecified: Secondary | ICD-10-CM | POA: Diagnosis not present

## 2023-05-27 DIAGNOSIS — K449 Diaphragmatic hernia without obstruction or gangrene: Secondary | ICD-10-CM | POA: Diagnosis not present

## 2023-05-27 DIAGNOSIS — R110 Nausea: Secondary | ICD-10-CM | POA: Diagnosis not present

## 2023-05-27 DIAGNOSIS — K5730 Diverticulosis of large intestine without perforation or abscess without bleeding: Secondary | ICD-10-CM | POA: Diagnosis not present

## 2023-05-27 DIAGNOSIS — Z9682 Presence of neurostimulator: Secondary | ICD-10-CM | POA: Diagnosis not present

## 2023-05-27 DIAGNOSIS — I2510 Atherosclerotic heart disease of native coronary artery without angina pectoris: Secondary | ICD-10-CM | POA: Insufficient documentation

## 2023-05-27 DIAGNOSIS — E119 Type 2 diabetes mellitus without complications: Secondary | ICD-10-CM | POA: Insufficient documentation

## 2023-05-27 DIAGNOSIS — I10 Essential (primary) hypertension: Secondary | ICD-10-CM | POA: Diagnosis not present

## 2023-05-27 DIAGNOSIS — K5900 Constipation, unspecified: Secondary | ICD-10-CM

## 2023-05-27 DIAGNOSIS — Z471 Aftercare following joint replacement surgery: Secondary | ICD-10-CM | POA: Diagnosis not present

## 2023-05-27 DIAGNOSIS — Z96641 Presence of right artificial hip joint: Secondary | ICD-10-CM | POA: Diagnosis not present

## 2023-05-27 DIAGNOSIS — I7143 Infrarenal abdominal aortic aneurysm, without rupture: Secondary | ICD-10-CM | POA: Diagnosis not present

## 2023-05-27 LAB — CBC WITH DIFFERENTIAL/PLATELET
Abs Immature Granulocytes: 0.03 10*3/uL (ref 0.00–0.07)
Basophils Absolute: 0.1 10*3/uL (ref 0.0–0.1)
Basophils Relative: 1 %
Eosinophils Absolute: 0.1 10*3/uL (ref 0.0–0.5)
Eosinophils Relative: 1 %
HCT: 45.6 % (ref 39.0–52.0)
Hemoglobin: 15.1 g/dL (ref 13.0–17.0)
Immature Granulocytes: 0 %
Lymphocytes Relative: 21 %
Lymphs Abs: 1.9 10*3/uL (ref 0.7–4.0)
MCH: 33.3 pg (ref 26.0–34.0)
MCHC: 33.1 g/dL (ref 30.0–36.0)
MCV: 100.4 fL — ABNORMAL HIGH (ref 80.0–100.0)
Monocytes Absolute: 1 10*3/uL (ref 0.1–1.0)
Monocytes Relative: 12 %
Neutro Abs: 6 10*3/uL (ref 1.7–7.7)
Neutrophils Relative %: 65 %
Platelets: 145 10*3/uL — ABNORMAL LOW (ref 150–400)
RBC: 4.54 MIL/uL (ref 4.22–5.81)
RDW: 12.2 % (ref 11.5–15.5)
WBC: 9 10*3/uL (ref 4.0–10.5)
nRBC: 0 % (ref 0.0–0.2)

## 2023-05-27 LAB — LIPASE, BLOOD: Lipase: 54 U/L — ABNORMAL HIGH (ref 11–51)

## 2023-05-27 LAB — COMPREHENSIVE METABOLIC PANEL
ALT: 21 U/L (ref 0–44)
AST: 24 U/L (ref 15–41)
Albumin: 3.8 g/dL (ref 3.5–5.0)
Alkaline Phosphatase: 77 U/L (ref 38–126)
Anion gap: 9 (ref 5–15)
BUN: 20 mg/dL (ref 8–23)
CO2: 26 mmol/L (ref 22–32)
Calcium: 9 mg/dL (ref 8.9–10.3)
Chloride: 104 mmol/L (ref 98–111)
Creatinine, Ser: 1.62 mg/dL — ABNORMAL HIGH (ref 0.61–1.24)
GFR, Estimated: 40 mL/min — ABNORMAL LOW (ref >60)
Glucose, Bld: 91 mg/dL (ref 70–99)
Potassium: 4.2 mmol/L (ref 3.5–5.1)
Sodium: 139 mmol/L (ref 135–145)
Total Bilirubin: 1.1 mg/dL (ref 0.0–1.2)
Total Protein: 7 g/dL (ref 6.5–8.1)

## 2023-05-27 NOTE — ED Triage Notes (Signed)
Pt to ED for constipation x3 days. Has had 3 enemas and reports had a small BM x2 today.

## 2023-05-27 NOTE — ED Notes (Signed)
Patient taken to imaging.

## 2023-05-27 NOTE — ED Notes (Signed)
[name] declined discharge vital signs.

## 2023-05-27 NOTE — ED Provider Notes (Signed)
[facility] Provider Note    Event Date/Time   First MD Initiated Contact with Patient 05/27/23 1207     (approximate)   History   Constipation   HPI  Leonard Tanori [facility] Sr. is a 89 y.o. male history of hypertension, hyperlipidemia, CAD, hypothyroidism, type 2 diabetes and constipation presents emergency department stating he feels like his abdomen is full.  Son states they have had several bowel movements and he states they were large.  Does not feel that it is constipation.  Patient states he is feels full and does not feel like he can eat.  No vomiting but did have nausea, no fever or chills      Physical Exam   Triage Vital Signs: ED Triage Vitals [05/27/23 1106]  Encounter Vitals Group     BP (!) 141/67     Systolic BP Percentile      Diastolic BP Percentile      Pulse Rate 62     Resp 16     Temp 98.3 F (36.8 C)     Temp src      SpO2 97 %     Weight 200 lb (90.7 kg)     Height 6\' 2"  (1.88 m)     Head Circumference      Peak Flow      Pain Score 3     Pain Loc      Pain Education      Exclude from Growth Chart     Most recent vital signs: Vitals:   05/27/23 1106 05/27/23 1255  BP: (!) 141/67 (!) 171/94  Pulse: 62 68  Resp: 16 16  Temp: 98.3 F (36.8 C)   SpO2: 97% 100%     General: Awake, no distress.   CV:  Good peripheral perfusion. regular rate and  rhythm Resp:  Normal effort. Lungs cta Abd:  No distention.  Minimally tender in all 4 quads, bowel sounds increased in the upper quadrants, decreased in the lower quads Other:      ED Results / Procedures / Treatments   Labs (all labs ordered are listed, but only abnormal results are displayed) Labs Reviewed  COMPREHENSIVE METABOLIC PANEL - Abnormal; Notable for the following components:      Result Value   Creatinine, Ser 1.62 (*)    GFR, Estimated 40 (*)    All other components within normal limits  CBC WITH DIFFERENTIAL/PLATELET - Abnormal; Notable for  the following components:   MCV 100.4 (*)    Platelets 145 (*)    All other components within normal limits  LIPASE, BLOOD - Abnormal; Notable for the following components:   Lipase 54 (*)    All other components within normal limits     EKG     RADIOLOGY X-ray of the abdomen 1 view CT abdomen pelvis without contrast due to patient's stage IV kidney disease    PROCEDURES:   Procedures Chief Complaint  Patient presents with   Constipation      MEDICATIONS ORDERED IN ED: Medications - No data to display   IMPRESSION / MDM / ASSESSMENT AND PLAN / ED COURSE  I reviewed the triage vital signs and the nursing notes.                              Differential diagnosis includes, but is not limited to, bowel obstruction, constipation, pancreatitis, mass, acute cholecystitis  Patient's presentation  is most consistent with acute illness / injury with system symptoms.   Labs are reassuring  X-ray of the abdomen shows small the gas pattern  CT abdomen pelvis independently reviewed interpreted by me as being negative for any acute abnormality  Bowel obstruction is not noted, pancreatitis, mass, and acute cholecystitis less likely secondary to labs being normal and no significant finding on CT  I did explain this to the patient and son.  Feel they need to use MiraLAX daily.  Unsure of why the patient is having the discomfort as there is no stool ball.  May be just due to a difference in the new constipation medication.  Patient and his son are in agreement treatment plan.  Patient was discharged stable condition in the care of his son.      FINAL CLINICAL IMPRESSION(S) / ED DIAGNOSES   Final diagnoses:  Constipation, unspecified constipation type     Rx / DC Orders   ED Discharge Orders     None        Note:  This document was prepared using Dragon voice recognition software and may include unintentional dictation errors.    Leonard Ghee,  PA-C 05/27/23 1651    Leonard Eddy, MD 05/31/23 [phone]

## 2023-05-27 NOTE — Discharge Instructions (Signed)
Start using miralax every day Drink plenty of water Return if worsening

## 2023-05-31 ENCOUNTER — Telehealth: Payer: Self-pay

## 2023-05-31 NOTE — Transitions of Care (Post Inpatient/ED Visit) (Signed)
   05/31/2023  Name: Leonard Berg [facility] Sr. MRN: [id number] DOB: Dec 25, 1929  Today's TOC FU Call Status: Today's TOC FU Call Status:: Successful TOC FU Call Completed TOC FU Call Complete Date: 05/31/23 Patient's Name and Date of Birth confirmed.  Transition Care Management Follow-up Telephone Call Date of Discharge: 05/27/23 Discharge Facility: [facility]) Type of Discharge: Emergency Department Reason for ED Visit: Other: (constipated) How have you been since you were released from the hospital?: Same Any questions or concerns?: Yes  Items Reviewed: Did you receive and understand the discharge instructions provided?: No Medications obtained,verified, and reconciled?: Yes (Medications Reviewed) Any new allergies since your discharge?: No Dietary orders reviewed?: Yes Do you have support at home?: Yes People in Home: child(ren), adult  Medications Reviewed Today: Medications Reviewed Today     Reviewed by Karena Addison, LPN (Licensed Practical Nurse) on 05/31/23 at 1731  Med List Status: <None>   Medication Order Taking? Sig Documenting Provider Last Dose Status Informant  amiodarone (PACERONE) 200 MG tablet [id number] No Take 1 tablet (200 mg total) by mouth daily. Tonny Bollman, MD Taking Active   cetirizine (ZYRTEC) 10 MG tablet [id number] No TAKE ONE TABLET BY MOUTH ONCE DAILY FOR ALLERGIES. Doreene Nest, NP Taking Active   ferrous sulfate 324 MG TBEC [id number] No Take 324 mg by mouth at bedtime. [provider] Taking Active Child  furosemide (LASIX) 40 MG tablet [id number] No Take 1 tablet (40 mg total) by mouth in the morning. Tonny Bollman, MD Taking Active   levothyroxine (SYNTHROID) 100 MCG tablet [id number] No TAKE ONE TABLET BY MOUTH ON EMPTY STOMACH WITH WATER ONLY ON SUNDAY THROUGH FRIDAY. TAKE ONE AND ONE HALF (1/2) TABS SATURDAY ONLY. NO FOOD OR MEDS FOR 30 MINUTES AFTER. Doreene Nest, NP Taking Active    lubiprostone (AMITIZA) 24 MCG capsule [id number]  Take 1 capsule (24 mcg total) by mouth 2 (two) times daily with a meal. For constipation Doreene Nest, NP  Active   potassium chloride (KLOR-CON) 10 MEQ tablet [id number] No Take 1 tablet (10 mEq total) by mouth daily.  Patient not taking: Reported on 04/26/2023   Tonny Bollman, MD Not Taking Active   Rivaroxaban (XARELTO) 15 MG TABS tablet [id number] No TAKE ONE TABLET BY MOUTH ONCE A DAY WITH SUPPER Doreene Nest, NP Taking Active   rosuvastatin (CRESTOR) 10 MG tablet [id number] No TAKE ONE TABLET BY MOUTH IN THE EVENING FOR CHOLESTEROL. Doreene Nest, NP Taking Active   vitamin B-12 (CYANOCOBALAMIN) 1000 MCG tablet [id number] No TAKE 1 TABLET BY MOUTH ONCE DAILY Rickard Patience, MD Taking Active Child            Home Care and Equipment/Supplies: Were Home Health Services Ordered?: NA Any new equipment or medical supplies ordered?: NA  Functional Questionnaire: Do you need assistance with bathing/showering or dressing?: No Do you need assistance with meal preparation?: No Do you need assistance with eating?: No Do you have difficulty maintaining continence: No Do you need assistance with getting out of bed/getting out of a chair/moving?: No Do you have difficulty managing or taking your medications?: No  Follow up appointments reviewed: PCP Follow-up appointment confirmed?: No (declined) MD Provider Line Number:[phone] Given: No Specialist Hospital Follow-up appointment confirmed?: NA Do you need transportation to your follow-up appointment?: No Do you understand care options if your condition(s) worsen?: Yes-patient verbalized understanding    SIGNATURE Karena Addison, LPN [facility] Nurse Health Advisor Direct Dial [phone]

## 2023-06-11 DIAGNOSIS — K5900 Constipation, unspecified: Secondary | ICD-10-CM

## 2023-06-11 MED ORDER — LINACLOTIDE 145 MCG PO CAPS: 145.0000 ug | ORAL_CAPSULE | ORAL | 1 refills | Status: DC

## 2023-07-19 ENCOUNTER — Telehealth: Payer: Self-pay | Admitting: Primary Care

## 2023-07-19 DIAGNOSIS — K5900 Constipation, unspecified: Secondary | ICD-10-CM

## 2023-07-19 NOTE — Telephone Encounter (Signed)
 Copied from CRM [id number]. Topic: Clinical - Prescription Issue >> Jul 19, 2023 10:41 AM Efraim Kaufmann C wrote: Reason for CRM: Pharmacy calling reagarding patient's linaclotide (LINZESS) 145 MCG CAPS capsule. [name] stated that prescription was changed to every day however prescription still states every other day. Please advise and update prescription to pharmacy. Thank you.

## 2023-07-19 NOTE — Telephone Encounter (Signed)
 Please call patient's son:  From my notes he was having diarrhea on daily Linzess, but has this changed? Is he doing okay by taking Linzess daily?

## 2023-07-20 MED ORDER — LINACLOTIDE 145 MCG PO CAPS: 145.0000 ug | ORAL_CAPSULE | Freq: Every day | ORAL | 1 refills | Status: DC

## 2023-07-20 NOTE — Addendum Note (Signed)
 Addended by: Doreene Nest on: 07/20/2023 09:03 AM   Modules accepted: Orders

## 2023-07-20 NOTE — Telephone Encounter (Signed)
 Called Patient son on dpr. States he has been taking daily with Murelax 2-3 times a week. Not having any diarrhea.

## 2023-07-20 NOTE — Telephone Encounter (Signed)
 Noted, will update Rx and send to pharmacy

## 2023-10-25 ENCOUNTER — Other Ambulatory Visit: Payer: Self-pay | Admitting: Primary Care

## 2023-10-25 DIAGNOSIS — E785 Hyperlipidemia, unspecified: Secondary | ICD-10-CM

## 2023-10-25 NOTE — Telephone Encounter (Signed)
Patient is due for CPE/follow up in late September, this will be required prior to any further refills.  Please schedule, thank you!

## 2023-10-27 NOTE — Telephone Encounter (Signed)
 Sch cpe for 01/04/24

## 2023-11-26 ENCOUNTER — Other Ambulatory Visit: Payer: Self-pay | Admitting: Cardiovascular Disease

## 2023-11-26 DIAGNOSIS — I2510 Atherosclerotic heart disease of native coronary artery without angina pectoris: Secondary | ICD-10-CM

## 2023-11-26 DIAGNOSIS — I10 Essential (primary) hypertension: Secondary | ICD-10-CM

## 2023-11-26 DIAGNOSIS — I5032 Chronic diastolic (congestive) heart failure: Secondary | ICD-10-CM

## 2023-11-26 DIAGNOSIS — I6523 Occlusion and stenosis of bilateral carotid arteries: Secondary | ICD-10-CM

## 2023-11-26 DIAGNOSIS — I480 Paroxysmal atrial fibrillation: Secondary | ICD-10-CM

## 2023-12-01 ENCOUNTER — Other Ambulatory Visit: Payer: Self-pay | Admitting: Primary Care

## 2023-12-01 DIAGNOSIS — I4891 Unspecified atrial fibrillation: Secondary | ICD-10-CM

## 2023-12-10 ENCOUNTER — Other Ambulatory Visit: Payer: Self-pay | Admitting: Primary Care

## 2023-12-10 DIAGNOSIS — E038 Other specified hypothyroidism: Secondary | ICD-10-CM

## 2023-12-23 ENCOUNTER — Other Ambulatory Visit: Payer: Self-pay | Admitting: Cardiovascular Disease

## 2023-12-23 DIAGNOSIS — I6523 Occlusion and stenosis of bilateral carotid arteries: Secondary | ICD-10-CM

## 2023-12-23 DIAGNOSIS — I480 Paroxysmal atrial fibrillation: Secondary | ICD-10-CM

## 2023-12-23 DIAGNOSIS — I10 Essential (primary) hypertension: Secondary | ICD-10-CM

## 2023-12-23 DIAGNOSIS — I5032 Chronic diastolic (congestive) heart failure: Secondary | ICD-10-CM

## 2023-12-23 DIAGNOSIS — I2510 Atherosclerotic heart disease of native coronary artery without angina pectoris: Secondary | ICD-10-CM

## 2024-01-04 ENCOUNTER — Encounter: Payer: Self-pay | Admitting: Primary Care

## 2024-01-04 ENCOUNTER — Ambulatory Visit: Payer: Self-pay | Admitting: Primary Care

## 2024-01-04 ENCOUNTER — Ambulatory Visit (INDEPENDENT_AMBULATORY_CARE_PROVIDER_SITE_OTHER)

## 2024-01-04 ENCOUNTER — Ambulatory Visit: Admitting: Primary Care

## 2024-01-04 VITALS — BP 122/64 | HR 48 | Temp 97.8°F | Ht 73.0 in | Wt 200.0 lb

## 2024-01-04 DIAGNOSIS — I480 Paroxysmal atrial fibrillation: Secondary | ICD-10-CM

## 2024-01-04 DIAGNOSIS — E1165 Type 2 diabetes mellitus with hyperglycemia: Secondary | ICD-10-CM | POA: Diagnosis not present

## 2024-01-04 DIAGNOSIS — E038 Other specified hypothyroidism: Secondary | ICD-10-CM | POA: Diagnosis not present

## 2024-01-04 DIAGNOSIS — Z0000 Encounter for general adult medical examination without abnormal findings: Secondary | ICD-10-CM

## 2024-01-04 DIAGNOSIS — E1122 Type 2 diabetes mellitus with diabetic chronic kidney disease: Secondary | ICD-10-CM

## 2024-01-04 DIAGNOSIS — I2510 Atherosclerotic heart disease of native coronary artery without angina pectoris: Secondary | ICD-10-CM

## 2024-01-04 DIAGNOSIS — K5900 Constipation, unspecified: Secondary | ICD-10-CM

## 2024-01-04 DIAGNOSIS — I130 Hypertensive heart and chronic kidney disease with heart failure and stage 1 through stage 4 chronic kidney disease, or unspecified chronic kidney disease: Secondary | ICD-10-CM | POA: Diagnosis not present

## 2024-01-04 DIAGNOSIS — I10 Essential (primary) hypertension: Secondary | ICD-10-CM

## 2024-01-04 DIAGNOSIS — I5032 Chronic diastolic (congestive) heart failure: Secondary | ICD-10-CM | POA: Diagnosis not present

## 2024-01-04 DIAGNOSIS — M150 Primary generalized (osteo)arthritis: Secondary | ICD-10-CM

## 2024-01-04 DIAGNOSIS — E782 Mixed hyperlipidemia: Secondary | ICD-10-CM | POA: Diagnosis not present

## 2024-01-04 DIAGNOSIS — Z23 Encounter for immunization: Secondary | ICD-10-CM

## 2024-01-04 DIAGNOSIS — I701 Atherosclerosis of renal artery: Secondary | ICD-10-CM

## 2024-01-04 DIAGNOSIS — N1832 Chronic kidney disease, stage 3b: Secondary | ICD-10-CM

## 2024-01-04 LAB — COMPREHENSIVE METABOLIC PANEL WITH GFR
ALT: 17 U/L (ref 0–53)
AST: 22 U/L (ref 0–37)
Albumin: 3.9 g/dL (ref 3.5–5.2)
Alkaline Phosphatase: 76 U/L (ref 39–117)
BUN: 21 mg/dL (ref 6–23)
CO2: 30 meq/L (ref 19–32)
Calcium: 9 mg/dL (ref 8.4–10.5)
Chloride: 102 meq/L (ref 96–112)
Creatinine, Ser: 1.64 mg/dL — ABNORMAL HIGH (ref 0.40–1.50)
GFR: 36.28 mL/min — ABNORMAL LOW (ref >60.00)
Glucose, Bld: 87 mg/dL (ref 70–99)
Potassium: 4.4 meq/L (ref 3.5–5.1)
Sodium: 140 meq/L (ref 135–145)
Total Bilirubin: 0.7 mg/dL (ref 0.2–1.2)
Total Protein: 6.3 g/dL (ref 6.0–8.3)

## 2024-01-04 LAB — LIPID PANEL
Cholesterol: 161 mg/dL (ref 0–200)
HDL: 50 mg/dL (ref >39.00)
LDL Cholesterol: 93 mg/dL (ref 0–99)
NonHDL: 110.61
Total CHOL/HDL Ratio: 3
Triglycerides: 90 mg/dL (ref 0.0–149.0)
VLDL: 18 mg/dL (ref 0.0–40.0)

## 2024-01-04 LAB — TSH: TSH: 0.63 u[IU]/mL (ref 0.35–5.50)

## 2024-01-04 NOTE — Assessment & Plan Note (Signed)
 Controlled.  Continue to monitor.

## 2024-01-04 NOTE — Assessment & Plan Note (Signed)
 Repeat renal function pending.  Following with nephrology who recently placed patient. Goal is comfort care, no aggressive treatment.

## 2024-01-04 NOTE — Assessment & Plan Note (Signed)
 Rate and rhythm regular today.  Continue amiodarone  200 mg daily, Xarelto  15 mg daily. Following with cardiology, office notes reviewed from September 2024

## 2024-01-04 NOTE — Assessment & Plan Note (Signed)
 Asymptomatic.  Continue blood pressure control, lipid control. Following with cardiology.  Office notes reviewed from September 2024

## 2024-01-04 NOTE — Assessment & Plan Note (Signed)
Repeat A1c pending. Remain off treatment.

## 2024-01-04 NOTE — Assessment & Plan Note (Signed)
 Immunizations UTD. Influenza vaccine provided today.  Colonoscopy N/A given age PSA N/A given age  Discussed the importance of a healthy diet and regular exercise in order for weight loss, and to reduce the risk of further co-morbidity.  Exam stable. Labs pending.  Follow up in 1 year for repeat physical.

## 2024-01-04 NOTE — Progress Notes (Signed)
 Subjective:    Patient ID: Leonard Lamar [facility] Sr., male    DOB: 1929/05/07, 89 y.o.   MRN: [id number]  Leonard Berg [facility] Sr. is a very pleasant 89 y.o. male who presents today for complete physical and follow up of chronic conditions.  Immunizations: -Tetanus: Completed in 2019 -Influenza: Due today -Shingles: Completed Zostavax -Pneumonia: Completed Prevnar 20 in 2024  Diet: Fair diet.  Exercise: Regular exercise, walking  Eye exam: Completed years ago  Dental exam: Completed years ago  Colonoscopy: Completed in 2021, no further screening given age.   PSA: N/A given age.  BP Readings from Last 3 Encounters:  01/04/24 122/64  05/27/23 (!) 171/94  12/31/22 134/86         Review of Systems  Constitutional:  Negative for unexpected weight change.  HENT:  Negative for rhinorrhea.   Respiratory:  Negative for cough and shortness of breath.   Cardiovascular:  Negative for chest pain.  Gastrointestinal:  Negative for constipation and diarrhea.  Genitourinary:  Negative for difficulty urinating.  Musculoskeletal:  Positive for arthralgias.  Skin:  Negative for rash.  Allergic/Immunologic: Negative for environmental allergies.  Neurological:  Negative for dizziness and headaches.  Psychiatric/Behavioral:  The patient is not nervous/anxious.          Past Medical History:  Diagnosis Date   Anemia    Anxiety    Arrhythmia    PAROXYSMAL ATRIAL FIBRILLATION   Arthritis    OSTEOARTHRITIS   Cataracts, bilateral    Chronic back pain    Coronary artery disease 11/2009   CABG...LM EMERGENT WITH IABP sees Dr. Ozell cooper   Dark brown urine 04/22/2020   DJD (degenerative joint disease)    DVT (deep venous thrombosis) (HCC)    Dyspnea    GERD (gastroesophageal reflux disease)    GIB (gastrointestinal bleeding) 05/18/2019   History of BPH    Hyperlipidemia    Hypertension    Hypothyroidism    Iron  deficiency anemia due to chronic blood loss  06/29/2019   Lumbar post-laminectomy syndrome    Myocardial infarction [facility])    Pulmonary embolism (HCC) 2009   hx of   RBBB (right bundle branch block)    Renal artery stenosis    Thrombocytopenia    Thyroid  disease    HYPOTHYROIDISM   Type 2 diabetes mellitus without complication, without long-term current use of insulin (HCC) 07/28/2019   Unstable angina (HCC)    NONE IN LONG TIME    Social History   Socioeconomic History   Marital status: Widowed    Spouse name: Not on file   Number of children: 2   Years of education: Not on file   Highest education level: 12th grade  Occupational History   Occupation: RETIRED     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, 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  Tobacco Use   Smoking status: Former    Current packs/day: 0.00    Types: Cigarettes    Quit date: 04/13/1974    Years since quitting: 49.7   Smokeless tobacco: Former    Types: Chew    Quit date: 01/26/2002   Tobacco comments:    He has smoked about 27-pack-year hx   Vaping Use   Vaping status: Never Used  Substance and Sexual Activity   Alcohol use: Not Currently    Alcohol/week: 2.0 standard drinks of alcohol    Types: 2 Cans of beer per week    Comment: He used to drink more heavily ,  but rarely drinks at the current time   Drug use: No   Sexual activity: Not on file  Other Topics Concern   Not on file  Social History Narrative   LIVES IN [place] WITH HIS WIFE.   HE HAS 2 GROWN CHILDREN   HE IS RETIRED PRISON FIRM SUPERINTENDENT.   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   HE GOES TO THE [employer] 3 X WEEKLY.   Social Drivers of Corporate investment banker Strain: Low Risk  (01/03/2024)   Overall Financial Resource Strain (CARDIA)    Difficulty of Paying Living Expenses: Not hard at all  Food Insecurity: No Food Insecurity (01/03/2024)   Hunger Vital Sign    Worried About Running Out of Food in the Last Year: Never true    Ran  Out of Food in the Last Year: Never true  Transportation Needs: No Transportation Needs (01/03/2024)   PRAPARE - Administrator, Civil Service (Medical): No    Lack of Transportation (Non-Medical): No  Physical Activity: Inactive (01/03/2024)   Exercise Vital Sign    Days of Exercise per Week: 0 days    Minutes of Exercise per Session: Not on file  Stress: No Stress Concern Present (01/03/2024)   [employer] of Occupational Health - Occupational Stress Questionnaire    Feeling of Stress: Not at all  Social Connections: Socially Isolated (01/03/2024)   Social Connection and Isolation Panel    Frequency of Communication with Friends and Family: Never    Frequency of Social Gatherings with Friends and Family: More than three times a week    Attends Religious Services: Never    Database administrator or Organizations: No    Attends Banker Meetings: Not on file    Marital Status: Widowed  Intimate Partner Violence: Not At Risk (04/26/2023)   Humiliation, Afraid, Rape, and Kick questionnaire    Fear of Current or Ex-Partner: No    Emotionally Abused: No    Physically Abused: No    Sexually Abused: No    Past Surgical History:  Procedure Laterality Date   Blood clot  Feb.20,  2016   Pulmonary Embolism   CARDIOVERSION N/A 03/15/2019   Procedure: CARDIOVERSION;  Surgeon: Mona Vinie BROCKS, MD;  Location: [facility] ENDOSCOPY;  Service: Cardiovascular;  Laterality: N/A;   CAROTID ENDARTERECTOMY Right Oct. 17, 2013   CE   CAROTID ENDARTERECTOMY Left Dec. 3, 2016   CE   CATARACT EXTRACTION  05/2014,06/2014   COLONOSCOPY WITH PROPOFOL  N/A 05/20/2019   Procedure: COLONOSCOPY WITH PROPOFOL ;  Surgeon: Wilhelmenia Aloha Raddle., MD;  Location: [facility] ENDOSCOPY;  Service: Gastroenterology;  Laterality: N/A;   CORONARY ARTERY BYPASS GRAFT  11-21-09   EMERGENT X 3 GRAFTING. ..PETER FLEETA OCHOA, MD, cc: [name]   ENDARTERECTOMY  01/28/2012   Procedure: ENDARTERECTOMY CAROTID;   Surgeon: Lonni GORMAN Blade, MD;  Location[facility] OR;  Service: Vascular;  Laterality: Right;  with Primary Closure of Artery   ENDARTERECTOMY  03/15/2012   Procedure: ENDARTERECTOMY CAROTID;  Surgeon: Lonni GORMAN Blade, MD;  Location: [facility] OR;  Service: Vascular;  Laterality: Left;   ENTEROSCOPY N/A 05/23/2019   Procedure: ENTEROSCOPY;  Surgeon: Rollin Dover, MD;  Location: [facility] ENDOSCOPY;  Service: Endoscopy;  Laterality: N/A;   ESOPHAGOGASTRODUODENOSCOPY N/A 05/20/2019   Procedure: ESOPHAGOGASTRODUODENOSCOPY (EGD);  Surgeon: Wilhelmenia Aloha Raddle., MD;  Location: [facility] ENDOSCOPY;  Service: Gastroenterology;  Laterality: N/A;   ESOPHAGOGASTRODUODENOSCOPY (EGD)  WITH PROPOFOL  N/A 05/19/2019   Procedure: ESOPHAGOGASTRODUODENOSCOPY (EGD) WITH PROPOFOL ;  Surgeon: Rollin Dover, MD;  Location: [facility] ENDOSCOPY;  Service: Endoscopy;  Laterality: N/A;   FOOT SURGERY     GIVENS CAPSULE STUDY N/A 05/20/2019   Procedure: GIVENS CAPSULE STUDY;  Surgeon: Wilhelmenia Aloha Raddle., MD;  Location: [facility] ENDOSCOPY;  Service: Gastroenterology;  Laterality: N/A;   HOT HEMOSTASIS N/A 05/23/2019   Procedure: HOT HEMOSTASIS (ARGON PLASMA COAGULATION/BICAP);  Surgeon: Rollin Dover, MD;  Location: [facility] ENDOSCOPY;  Service: Endoscopy;  Laterality: N/A;   MANDIBLE SURGERY     MAXIMUM ACCESS (MAS)POSTERIOR LUMBAR INTERBODY FUSION (PLIF) 2 LEVEL N/A 08/08/2015   Procedure: Lumbar Four-Five, Lumbar Five-Sacral One Maximum access posterior lumbar interbody fusion;  Surgeon: Fairy Levels, MD;  Location: [facility] NEURO ORS;  Service: Neurosurgery;  Laterality: N/A;  LUMBAR FOUR-FIVE ,LUMBAR FIVE -SACRAL Maximum access posterior lumbar interbody fusion   POLYPECTOMY  05/20/2019   Procedure: POLYPECTOMY;  Surgeon: [name], Aloha Raddle., MD;  Location: [facility] ENDOSCOPY;  Service: Gastroenterology;;   RENAL ARTERY STENT  2010   REPLACEMENT TOTAL KNEE  2006   RIGHT   SPINAL CORD STIMULATOR INSERTION N/A 06/18/2017   Procedure: LUMBAR SPINAL CORD STIMULATOR  INSERTION;  Surgeon: Mindi Mt, MD;  Location: [facility] OR;  Service: Neurosurgery;  Laterality: N/A;  LUMBAR SPINAL CORD STIMULATOR INSERTION   TOTAL HIP ARTHROPLASTY     right    Family History  Problem Relation Age of Onset   Lung cancer Father 75       deceased    Cancer Father 60       LUNG   Stroke Mother 87       DECEASED    Hyperlipidemia Mother    Heart attack Brother 20       deceased   Diabetes Brother    Liver cancer Sister 35       deceased    Cancer Sister     No Known Allergies  Current Outpatient Medications on File Prior to Visit  Medication Sig Dispense Refill   amiodarone  (PACERONE ) 200 MG tablet Take 1 tablet (200 mg total) by mouth daily. 90 tablet 3   cetirizine (ZYRTEC) 10 MG tablet TAKE ONE TABLET BY MOUTH ONCE DAILY FOR ALLERGIES. 90 tablet 0   ferrous sulfate 324 MG TBEC Take 324 mg by mouth at bedtime.     furosemide  (LASIX ) 40 MG tablet Take 1 tablet (40 mg total) by mouth in the morning. 90 tablet 3   levothyroxine  (SYNTHROID ) 100 MCG tablet TAKE ONE TABLET BY MOUTH ON EMPTY STOMACH WITH WATER ONLY ON SUNDAY THROUGH FRIDAY. TAKE ONE AND ONE HALF (1/2) TABS SATURDAY ONLY. NO FOOD OR MEDS FOR 30 MINUTES AFTER. 94 tablet 0   linaclotide  (LINZESS ) 145 MCG CAPS capsule Take 1 capsule (145 mcg total) by mouth daily before breakfast. For constipation 90 capsule 1   potassium chloride  (KLOR-CON ) 10 MEQ tablet TAKE ONE TABLET (10 MEQ TOTAL) BY MOUTH DAILY. 90 tablet 0   Rivaroxaban  (XARELTO ) 15 MG TABS tablet TAKE ONE TABLET BY MOUTH ONCE A DAY WITH SUPPER 90 tablet 0   rosuvastatin  (CRESTOR ) 10 MG tablet TAKE ONE TABLET BY MOUTH IN THE EVENING FOR CHOLESTEROL. 90 tablet 0   vitamin B-12 (CYANOCOBALAMIN ) 1000 MCG tablet TAKE 1 TABLET BY MOUTH ONCE DAILY 90 tablet 1   No current facility-administered medications on file prior to visit.    BP 122/64   Pulse (!) 48   Temp 97.8 F (36.6 C) (Temporal)  Ht 6' 1 (1.854 m)   Wt 200 lb (90.7 kg)   SpO2 98%    BMI 26.39 kg/m  Objective:   Physical Exam HENT:     Right Ear: Tympanic membrane and ear canal normal.     Left Ear: Tympanic membrane and ear canal normal.  Eyes:     Pupils: Pupils are equal, round, and reactive to light.  Cardiovascular:     Rate and Rhythm: Normal rate and regular rhythm.  Pulmonary:     Effort: Pulmonary effort is normal.     Breath sounds: Normal breath sounds.  Abdominal:     General: Bowel sounds are normal.     Palpations: Abdomen is soft.     Tenderness: There is no abdominal tenderness.  Musculoskeletal:     Cervical back: Neck supple.     Comments: Ambulates with walker today.  Skin:    General: Skin is warm and dry.  Neurological:     Mental Status: He is alert and oriented to person, place, and time.     Cranial Nerves: No cranial nerve deficit.     Deep Tendon Reflexes:     Reflex Scores:      Patellar reflexes are 2+ on the right side and 2+ on the left side. Psychiatric:        Mood and Affect: Mood normal.     Physical Exam        Assessment & Plan:  Preventative health care Assessment & Plan: Immunizations UTD. Influenza vaccine provided today.  Colonoscopy N/A given age PSA N/A given age  Discussed the importance of a healthy diet and regular exercise in order for weight loss, and to reduce the risk of further co-morbidity.  Exam stable. Labs pending.  Follow up in 1 year for repeat physical.    Renal artery atherosclerosis Assessment & Plan: No longer following with nephrology.    Essential hypertension Assessment & Plan: Controlled.  Continue to monitor.  Orders: -     Comprehensive metabolic panel with GFR  Coronary artery disease involving native coronary artery of native heart without angina pectoris Assessment & Plan: Asymptomatic.  Continue blood pressure control, lipid control. Following with cardiology.  Office notes reviewed from September 2024   Other specified hypothyroidism Assessment &  Plan: Repeat TSH pending. Continue levothyroxine  150 mcg 1 day weekly and 100 mcg 6 days weekly.   Controlled type 2 diabetes mellitus with hyperglycemia, without long-term current use of insulin (HCC) Assessment & Plan: Repeat A1c pending.  Remain off treatment.   Primary osteoarthritis involving multiple joints Assessment & Plan: Stable.  Continue Tylenol  Extra Strength 500 mg..   Stage 3b chronic kidney disease (HCC) Assessment & Plan: Repeat renal function pending.  Following with nephrology who recently placed patient. Goal is comfort care, no aggressive treatment.   Mixed hyperlipidemia Assessment & Plan: Repeat lipid panel pending. Continue rosuvastatin  10 mg daily.  Orders: -     Lipid panel  Constipation, unspecified constipation type Assessment & Plan: Well-controlled.  Continue Linzess  145 mcg daily.   PAF (paroxysmal atrial fibrillation) (HCC) Assessment & Plan: Rate and rhythm regular today.  Continue amiodarone  200 mg daily, Xarelto  15 mg daily. Following with cardiology, office notes reviewed from September 2024   HFimpEF (heart failure with improved EF) Assessment & Plan: Appears euvolemic today.  Continue furosemide  40 mg daily and potassium 10 mEq daily.   Encounter for immunization -     Flu vaccine HIGH DOSE PF(Fluzone Trivalent)  Assessment and Plan Assessment & Plan         Comer MARLA Gaskins, NP     History of Present Illness

## 2024-01-04 NOTE — Assessment & Plan Note (Signed)
 Stable.  Continue Tylenol  Extra Strength 500 mg.SABRA

## 2024-01-04 NOTE — Assessment & Plan Note (Signed)
 Well-controlled.  Continue Linzess  145 mcg daily.

## 2024-01-04 NOTE — Assessment & Plan Note (Signed)
 Appears euvolemic today.  Continue furosemide  40 mg daily and potassium 10 mEq daily.

## 2024-01-04 NOTE — Assessment & Plan Note (Addendum)
 No longer following with nephrology.

## 2024-01-04 NOTE — Assessment & Plan Note (Signed)
 Repeat lipid panel pending. Continue rosuvastatin 10 mg daily.

## 2024-01-04 NOTE — Assessment & Plan Note (Signed)
 Repeat TSH pending. Continue levothyroxine  150 mcg 1 day weekly and 100 mcg 6 days weekly.

## 2024-01-20 ENCOUNTER — Other Ambulatory Visit: Payer: Self-pay | Admitting: Primary Care

## 2024-01-20 DIAGNOSIS — E785 Hyperlipidemia, unspecified: Secondary | ICD-10-CM

## 2024-02-09 ENCOUNTER — Other Ambulatory Visit: Payer: Self-pay | Admitting: Cardiovascular Disease

## 2024-02-09 DIAGNOSIS — I5032 Chronic diastolic (congestive) heart failure: Secondary | ICD-10-CM

## 2024-02-09 DIAGNOSIS — I2510 Atherosclerotic heart disease of native coronary artery without angina pectoris: Secondary | ICD-10-CM

## 2024-02-09 DIAGNOSIS — I10 Essential (primary) hypertension: Secondary | ICD-10-CM

## 2024-02-09 DIAGNOSIS — I480 Paroxysmal atrial fibrillation: Secondary | ICD-10-CM

## 2024-02-09 DIAGNOSIS — I6523 Occlusion and stenosis of bilateral carotid arteries: Secondary | ICD-10-CM

## 2024-03-02 ENCOUNTER — Other Ambulatory Visit: Payer: Self-pay | Admitting: Primary Care

## 2024-03-02 ENCOUNTER — Other Ambulatory Visit: Payer: Self-pay | Admitting: Cardiovascular Disease

## 2024-03-02 DIAGNOSIS — I6523 Occlusion and stenosis of bilateral carotid arteries: Secondary | ICD-10-CM

## 2024-03-02 DIAGNOSIS — I2510 Atherosclerotic heart disease of native coronary artery without angina pectoris: Secondary | ICD-10-CM

## 2024-03-02 DIAGNOSIS — I10 Essential (primary) hypertension: Secondary | ICD-10-CM

## 2024-03-02 DIAGNOSIS — I5032 Chronic diastolic (congestive) heart failure: Secondary | ICD-10-CM

## 2024-03-02 DIAGNOSIS — J309 Allergic rhinitis, unspecified: Secondary | ICD-10-CM

## 2024-03-02 DIAGNOSIS — I480 Paroxysmal atrial fibrillation: Secondary | ICD-10-CM

## 2024-03-04 ENCOUNTER — Other Ambulatory Visit: Payer: Self-pay | Admitting: Primary Care

## 2024-03-04 DIAGNOSIS — K5900 Constipation, unspecified: Secondary | ICD-10-CM

## 2024-03-06 ENCOUNTER — Other Ambulatory Visit: Payer: Self-pay | Admitting: Primary Care

## 2024-03-06 DIAGNOSIS — E038 Other specified hypothyroidism: Secondary | ICD-10-CM

## 2024-03-14 ENCOUNTER — Other Ambulatory Visit: Payer: Self-pay | Admitting: Cardiovascular Disease

## 2024-03-14 DIAGNOSIS — I10 Essential (primary) hypertension: Secondary | ICD-10-CM

## 2024-03-14 DIAGNOSIS — I5032 Chronic diastolic (congestive) heart failure: Secondary | ICD-10-CM

## 2024-03-14 DIAGNOSIS — I480 Paroxysmal atrial fibrillation: Secondary | ICD-10-CM

## 2024-03-14 DIAGNOSIS — I6523 Occlusion and stenosis of bilateral carotid arteries: Secondary | ICD-10-CM

## 2024-03-14 DIAGNOSIS — I2510 Atherosclerotic heart disease of native coronary artery without angina pectoris: Secondary | ICD-10-CM

## 2024-03-16 ENCOUNTER — Other Ambulatory Visit: Payer: Self-pay | Admitting: Cardiovascular Disease

## 2024-03-16 DIAGNOSIS — I6523 Occlusion and stenosis of bilateral carotid arteries: Secondary | ICD-10-CM

## 2024-03-16 DIAGNOSIS — I480 Paroxysmal atrial fibrillation: Secondary | ICD-10-CM

## 2024-03-16 DIAGNOSIS — I10 Essential (primary) hypertension: Secondary | ICD-10-CM

## 2024-03-16 DIAGNOSIS — I5032 Chronic diastolic (congestive) heart failure: Secondary | ICD-10-CM

## 2024-03-16 DIAGNOSIS — I2510 Atherosclerotic heart disease of native coronary artery without angina pectoris: Secondary | ICD-10-CM

## 2024-03-21 ENCOUNTER — Encounter: Payer: Self-pay | Admitting: Cardiovascular Disease

## 2024-03-23 MED ORDER — POTASSIUM CHLORIDE ER 10 MEQ PO TBCR: 10.0000 meq | EXTENDED_RELEASE_TABLET | Freq: Every day | ORAL | 3 refills | Status: AC

## 2024-04-27 ENCOUNTER — Ambulatory Visit: Payer: Medicare Other

## 2024-05-01 ENCOUNTER — Ambulatory Visit

## 2024-05-01 VITALS — BP 122/64 | Ht 73.0 in | Wt 200.0 lb

## 2024-05-01 DIAGNOSIS — K5900 Constipation, unspecified: Secondary | ICD-10-CM

## 2024-05-01 DIAGNOSIS — Z0000 Encounter for general adult medical examination without abnormal findings: Secondary | ICD-10-CM

## 2024-05-01 MED ORDER — LINZESS 145 MCG PO CAPS: 145.0000 ug | ORAL_CAPSULE | Freq: Every day | ORAL | 2 refills | Status: AC

## 2024-05-01 NOTE — Patient Instructions (Signed)
 Leonard Berg,  Thank you for taking the time for your Medicare Wellness Visit. I appreciate your continued commitment to your health goals. Please review the care plan we discussed, and feel free to reach out if I can assist you further.  Please note that Annual Wellness Visits do not include a physical exam. Some assessments may be limited, especially if the visit was conducted virtually. If needed, we may recommend an in-person follow-up with your provider.  Ongoing Care Seeing your primary care provider every 3 to 6 months helps us  monitor your health and provide consistent, personalized care.   Referrals If a referral was made during today's visit and you haven't received any updates within two weeks, please contact the referred provider directly to check on the status.  Recommended Screenings:  Health Maintenance  Topic Date Due   Eye exam for diabetics  Never done   Zoster (Shingles) Vaccine (1 of 2) 04/03/1949   Complete foot exam   12/23/2022   Hemoglobin A1C  06/30/2023   Medicare Annual Wellness Visit  04/25/2024   DTaP/Tdap/Td vaccine (2 - Td or Tdap) 01/21/2028   Pneumococcal Vaccine for age over 79  Completed   Flu Shot  Completed   Meningitis B Vaccine  Aged Out   COVID-19 Vaccine  Discontinued       05/01/2024    8:11 AM  Advanced Directives  Does Patient Have a Medical Advance Directive? Yes  Type of Estate Agent of [place];Living will  Does patient want to make changes to medical advance directive? No - Patient declined  Copy of Healthcare Power of Attorney in Chart? No - copy requested    Vision: Annual vision screenings are recommended for early detection of glaucoma, cataracts, and diabetic retinopathy. These exams can also reveal signs of chronic conditions such as diabetes and high blood pressure.  Dental: Annual dental screenings help detect early signs of oral cancer, gum disease, and other conditions linked to overall health,  including heart disease and diabetes.  Please see the attached documents for additional preventive care recommendations.

## 2024-05-01 NOTE — Progress Notes (Signed)
 " I connected with  Leonard Bartoletti Pita Sr. on 05/01/24 by a video and audio enabled telemedicine application and verified that I am speaking with the correct person using two identifiers.  Patient Location: Home  Provider Location: Home Office  Persons Participating in Visit: Patient assisted by POA Armida Alderman.  I discussed the limitations of evaluation and management by telemedicine. The patient expressed understanding and agreed to proceed.  Vital Signs: Because this visit was a virtual/telehealth visit, some criteria may be missing or patient reported. Any vitals not documented were not able to be obtained and vitals that have been documented are patient reported.  Chief Complaint  Patient presents with   Medicare Wellness     Subjective:   Leonard Stanco [facility] Sr. is a 89 y.o. male who presents for a Medicare Annual Wellness Visit.  Visit info / Clinical Intake: Medicare Wellness Visit Type:: Subsequent Annual Wellness Visit Persons participating in visit and providing information:: patient Medicare Wellness Visit Mode:: Video Since this visit was completed virtually, some vitals may be partially provided or unavailable. Missing vitals are due to the limitations of the virtual format.: Documented vitals are patient reported If Telephone or Video please confirm:: I connected with patient using audio/video enable telemedicine. I verified patient identity with two identifiers, discussed telehealth limitations, and patient agreed to proceed. Patient Location:: home Provider Location:: home office Interpreter Needed?: No Pre-visit prep was completed: yes AWV questionnaire completed by patient prior to visit?: yes Date:: 05/01/24 Living arrangements:: (!) (Proxy-Rptd) lives alone Patient's Overall Health Status Rating: (!) (Proxy-Rptd) fair Typical amount of pain: (Proxy-Rptd) some Does pain affect daily life?: (Proxy-Rptd) no Are you currently prescribed opioids?:  no  Dietary Habits and Nutritional Risks How many meals a day?: (Proxy-Rptd) 3 Eats fruit and vegetables daily?: (!) (Proxy-Rptd) no Most meals are obtained by: (Proxy-Rptd) preparing own meals In the last 2 weeks, have you had any of the following?: none Diabetic:: no  Functional Status Activities of Daily Living (to include ambulation/medication): (Proxy-Rptd) Independent Ambulation: (Proxy-Rptd) Independent with device- listed below Medication Administration: (Proxy-Rptd) Needs assistance (comment) Home Management (perform basic housework or laundry): (Proxy-Rptd) Needs assistance (comment) Manage your own finances?: (!) (Proxy-Rptd) no Primary transportation is: (Proxy-Rptd) family / friends Concerns about vision?: no *vision screening is required for WTM* Concerns about hearing?: no  Fall Screening Falls in the past year?: (Proxy-Rptd) 0 Number of falls in past year: 0 Was there an injury with Fall?: 0 Fall Risk Category Calculator: 0 Patient Fall Risk Level: Low Fall Risk  Fall Risk Patient at Risk for Falls Due to: Impaired mobility; No Fall Risks Fall risk Follow up: Falls evaluation completed; Falls prevention discussed  Home and Transportation Safety: All rugs have non-skid backing?: (Proxy-Rptd) yes All stairs or steps have railings?: (Proxy-Rptd) yes Grab bars in the bathtub or shower?: (Proxy-Rptd) yes Have non-skid surface in bathtub or shower?: (!) (Proxy-Rptd) no Good home lighting?: (Proxy-Rptd) yes Regular seat belt use?: (!) (Proxy-Rptd) no Hospital stays in the last year:: (Proxy-Rptd) no  Cognitive Assessment Difficulty concentrating, remembering, or making decisions? : (Proxy-Rptd) no Will 6CIT or Mini Cog be Completed: yes What year is it?: 4 points What month is it?: 0 points Give patient an address phrase to remember (5 components): remember apple , table , penny About what time is it?: 0 points Count backwards from 20 to 1: 0 points Say the  months of the year in reverse: 0 points Repeat the address phrase from earlier: 0 points 6  CIT Score: 4 points  Advance Directives (For Healthcare) Does Patient Have a Medical Advance Directive?: Yes Does patient want to make changes to medical advance directive?: No - Patient declined Type of Advance Directive: Healthcare Power of Liberal; Living will Copy of Healthcare Power of Attorney in Chart?: No - copy requested Copy of Living Will in Chart?: No - copy requested  Reviewed/Updated  Reviewed/Updated: Reviewed All (Medical, Surgical, Family, Medications, Allergies, Care Teams, Patient Goals)    Allergies (verified) Patient has no known allergies.   Current Medications (verified) Outpatient Encounter Medications as of 05/01/2024  Medication Sig   amiodarone  (PACERONE ) 200 MG tablet Take 1 tablet (200 mg total) by mouth daily.   cetirizine (ZYRTEC) 10 MG tablet TAKE 1 TABLET BY MOUTH ONCE DAILY FOR ALLERGIES.   ferrous sulfate 324 MG TBEC Take 324 mg by mouth at bedtime.   furosemide  (LASIX ) 40 MG tablet Take 1 tablet (40 mg total) by mouth in the morning.   levothyroxine  (SYNTHROID ) 100 MCG tablet TAKE ONE TABLET BY MOUTH ON EMPTY STOMACH WITH WATER ONLY ON SUNDAY THROUGH FRIDAY. TAKE ONE AND ONE HALF (1/2) TABS SATURDAY ONLY. NO FOOD OR MEDS FOR 30 MINUTES AFTER.   LINZESS  145 MCG CAPS capsule Take 1 capsule (145 mcg total) by mouth daily before breakfast. For constipation   potassium chloride  (KLOR-CON ) 10 MEQ tablet Take 1 tablet (10 mEq total) by mouth daily. Per Dr Wonda - OK to refill - pt will f/u with PCP   Rivaroxaban  (XARELTO ) 15 MG TABS tablet Take 1 tablet (15 mg total) by mouth daily with supper.   rosuvastatin  (CRESTOR ) 10 MG tablet TAKE ONE TABLET BY MOUTH IN THE EVENING FOR CHOLESTEROL.   vitamin B-12 (CYANOCOBALAMIN ) 1000 MCG tablet TAKE 1 TABLET BY MOUTH ONCE DAILY   No facility-administered encounter medications on file as of 05/01/2024.    History: Past  Medical History:  Diagnosis Date   Anemia    Anxiety    Arrhythmia    PAROXYSMAL ATRIAL FIBRILLATION   Arthritis    OSTEOARTHRITIS   Cataracts, bilateral    Chronic back pain    Coronary artery disease 11/2009   CABG...LM EMERGENT WITH IABP sees Dr. Ozell cooper   Dark brown urine 04/22/2020   DJD (degenerative joint disease)    DVT (deep venous thrombosis) (HCC)    Dyspnea    GERD (gastroesophageal reflux disease)    GIB (gastrointestinal bleeding) 05/18/2019   History of BPH    Hyperlipidemia    Hypertension    Hypothyroidism    Iron  deficiency anemia due to chronic blood loss 06/29/2019   Lumbar post-laminectomy syndrome    Myocardial infarction [facility])    Pulmonary embolism (HCC) 2009   hx of   RBBB (right bundle branch block)    Renal artery stenosis    Thrombocytopenia    Thyroid  disease    HYPOTHYROIDISM   Type 2 diabetes mellitus without complication, without long-term current use of insulin (HCC) 07/28/2019   Unstable angina (HCC)    NONE IN LONG TIME   Past Surgical History:  Procedure Laterality Date   Blood clot  Feb.20,  2016   Pulmonary Embolism   CARDIOVERSION N/A 03/15/2019   Procedure: CARDIOVERSION;  Surgeon: Mona Vinie BROCKS, MD;  Location: [facility] ENDOSCOPY;  Service: Cardiovascular;  Laterality: N/A;   CAROTID ENDARTERECTOMY Right Oct. 17, 2013   CE   CAROTID ENDARTERECTOMY Left Dec. 3, 2016   CE   CATARACT EXTRACTION  05/2014,06/2014   COLONOSCOPY WITH  PROPOFOL  N/A 05/20/2019   Procedure: COLONOSCOPY WITH PROPOFOL ;  Surgeon: Wilhelmenia Aloha Raddle., MD;  Location: [facility] ENDOSCOPY;  Service: Gastroenterology;  Laterality: N/A;   CORONARY ARTERY BYPASS GRAFT  11-21-09   EMERGENT X 3 GRAFTING. ..PETER FLEETA OCHOA, MD, cc: [name]   ENDARTERECTOMY  01/28/2012   Procedure: ENDARTERECTOMY CAROTID;  Surgeon: Lonni GORMAN Blade, MD;  Location: [facility] OR;  Service: Vascular;  Laterality: Right;  with Primary Closure of Artery   ENDARTERECTOMY  03/15/2012    Procedure: ENDARTERECTOMY CAROTID;  Surgeon: Lonni GORMAN Blade, MD;  Location: [facility] OR;  Service: Vascular;  Laterality: Left;   ENTEROSCOPY N/A 05/23/2019   Procedure: ENTEROSCOPY;  Surgeon: Rollin Dover, MD;  Location: [facility] ENDOSCOPY;  Service: Endoscopy;  Laterality: N/A;   ESOPHAGOGASTRODUODENOSCOPY N/A 05/20/2019   Procedure: ESOPHAGOGASTRODUODENOSCOPY (EGD);  Surgeon: Wilhelmenia Aloha Raddle., MD;  Location: [facility] ENDOSCOPY;  Service: Gastroenterology;  Laterality: N/A;   ESOPHAGOGASTRODUODENOSCOPY (EGD) WITH PROPOFOL  N/A 05/19/2019   Procedure: ESOPHAGOGASTRODUODENOSCOPY (EGD) WITH PROPOFOL ;  Surgeon: Rollin Dover, MD;  Location: [facility] ENDOSCOPY;  Service: Endoscopy;  Laterality: N/A;   FOOT SURGERY     GIVENS CAPSULE STUDY N/A 05/20/2019   Procedure: GIVENS CAPSULE STUDY;  Surgeon: Wilhelmenia Aloha Raddle., MD;  Location: [facility] ENDOSCOPY;  Service: Gastroenterology;  Laterality: N/A;   HOT HEMOSTASIS N/A 05/23/2019   Procedure: HOT HEMOSTASIS (ARGON PLASMA COAGULATION/BICAP);  Surgeon: Rollin Dover, MD;  Location: [facility] ENDOSCOPY;  Service: Endoscopy;  Laterality: N/A;   MANDIBLE SURGERY     MAXIMUM ACCESS (MAS)POSTERIOR LUMBAR INTERBODY FUSION (PLIF) 2 LEVEL N/A 08/08/2015   Procedure: Lumbar Four-Five, Lumbar Five-Sacral One Maximum access posterior lumbar interbody fusion;  Surgeon: Fairy Levels, MD;  Location: [facility] NEURO ORS;  Service: Neurosurgery;  Laterality: N/A;  LUMBAR FOUR-FIVE ,LUMBAR FIVE -SACRAL Maximum access posterior lumbar interbody fusion   POLYPECTOMY  05/20/2019   Procedure: POLYPECTOMY;  Surgeon: [name], Aloha Raddle., MD;  Location: [facility] ENDOSCOPY;  Service: Gastroenterology;;   RENAL ARTERY STENT  2010   REPLACEMENT TOTAL KNEE  2006   RIGHT   SPINAL CORD STIMULATOR INSERTION N/A 06/18/2017   Procedure: LUMBAR SPINAL CORD STIMULATOR INSERTION;  Surgeon: Mindi Mt, MD;  Location[facility] OR;  Service: Neurosurgery;  Laterality: N/A;  LUMBAR SPINAL CORD STIMULATOR INSERTION   TOTAL HIP  ARTHROPLASTY     right   Family History  Problem Relation Age of Onset   Lung cancer Father 61       deceased    Cancer Father 35       LUNG   Stroke Mother 35       DECEASED    Hyperlipidemia Mother    Heart attack Brother 41       deceased   Diabetes Brother    Liver cancer Sister 51       deceased    Cancer Sister    Social History   Occupational History   Occupation: RETIRED     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, 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  Tobacco Use   Smoking status: Former    Current packs/day: 0.00    Types: Cigarettes    Quit date: 04/13/1974    Years since quitting: 50.0   Smokeless tobacco: Former    Types: Chew    Quit date: 01/26/2002   Tobacco comments:    He has smoked about 27-pack-year hx   Vaping Use   Vaping status: Never Used  Substance and Sexual Activity   Alcohol use: Not Currently  Alcohol/week: 2.0 standard drinks of alcohol    Types: 2 Cans of beer per week    Comment: He used to drink more heavily , but rarely drinks at the current time   Drug use: No   Sexual activity: Not on file   Tobacco Counseling Counseling given: Not Answered Tobacco comments: He has smoked about 27-pack-year hx   SDOH Screenings   Food Insecurity: No Food Insecurity (05/01/2024)  Housing: Low Risk (05/01/2024)  Transportation Needs: No Transportation Needs (05/01/2024)  Utilities: Not At Risk (05/01/2024)  Alcohol Screen: Low Risk (04/26/2023)  Depression (PHQ2-9): Low Risk (05/01/2024)  Financial Resource Strain: Low Risk (05/01/2024)  Physical Activity: Insufficiently Active (05/01/2024)  Social Connections: Socially Isolated (05/01/2024)  Stress: No Stress Concern Present (05/01/2024)  Tobacco Use: Medium Risk (05/01/2024)  Health Literacy: Adequate Health Literacy (05/01/2024)   See flowsheets for full screening details  Depression Screen PHQ 2 & 9 Depression Scale- Over the past 2 weeks, how often have you been bothered by any of the  following problems? Little interest or pleasure in doing things: 1 Feeling down, depressed, or hopeless (PHQ Adolescent also includes...irritable): 1 PHQ-2 Total Score: 2 Trouble falling or staying asleep, or sleeping too much: 0 Feeling tired or having little energy: 0 Poor appetite or overeating (PHQ Adolescent also includes...weight loss): 0 Feeling bad about yourself - or that you are a failure or have let yourself or your family down: 0 Trouble concentrating on things, such as reading the newspaper or watching television (PHQ Adolescent also includes...like school work): 0 Moving or speaking so slowly that other people could have noticed. Or the opposite - being so fidgety or restless that you have been moving around a lot more than usual: 0 Thoughts that you would be better off dead, or of hurting yourself in some way: 0 PHQ-9 Total Score: 2 If you checked off any problems, how difficult have these problems made it for you to do your work, take care of things at home, or get along with other people?: Not difficult at all  Depression Treatment Depression Interventions/Treatment : [id number] Score <4 Follow-up Not Indicated     Goals Addressed               This Visit's Progress     Patient Stated (pt-stated)        Patient would like to remain healthy              Objective:    Today's Vitals   05/01/24 0853  BP: 122/64  Weight: 200 lb (90.7 kg)  Height: 6' 1 (1.854 m)   Body mass index is 26.39 kg/m.  Hearing/Vision screen Hearing Screening - Comments:: Some difficulties but no hearing aids  Vision Screening - Comments:: Patient has some trouble with eyes. Patient declined eye doctor  Immunizations and Health Maintenance Health Maintenance  Topic Date Due   OPHTHALMOLOGY EXAM  Never done   Zoster Vaccines- Shingrix (1 of 2) 04/03/1949   FOOT EXAM  12/23/2022   HEMOGLOBIN A1C  06/30/2023   Medicare Annual Wellness (AWV)  04/25/2024   DTaP/Tdap/Td (2 - Td or  Tdap) 01/21/2028   Pneumococcal Vaccine: 50+ Years  Completed   Influenza Vaccine  Completed   Meningococcal B Vaccine  Aged Out   COVID-19 Vaccine  Discontinued        Assessment/Plan:  This is a routine wellness examination for [place].  Patient Care Team: Gretta Comer POUR, NP as PCP - General (Internal Medicine) Wonda Sharper,  MD as PCP - Cardiology (Cardiology) Babara Call, MD as Consulting Physician (Hematology and Oncology) Phares Shaver, [place] as Physician Assistant (Nephrology) Fate Morna SAILOR, [facility] (Inactive) as Pharmacist (Pharmacist)  I have personally reviewed and noted the following in the patients chart:   Medical and social history Use of alcohol, tobacco or illicit drugs  Current medications and supplements including opioid prescriptions. Functional ability and status Nutritional status Physical activity Advanced directives List of other physicians Hospitalizations, surgeries, and ER visits in previous 12 months Vitals Screenings to include cognitive, depression, and falls Referrals and appointments  No orders of the defined types were placed in this encounter.  In addition, I have reviewed and discussed with patient certain preventive protocols, quality metrics, and best practice recommendations. A written personalized care plan for preventive services as well as general preventive health recommendations were provided to patient.   Lyle MARLA Right, [place]   05/01/2024   No follow-ups on file.  After Visit Summary: (MyChart) Due to this being a telephonic visit, the after visit summary with patients personalized plan was offered to patient via MyChart   No voiced or noted concerns at this time Vaccines not given: shingles vaccine declined today HM Addressed: patient declined eye exam and foot exam  "

## 2024-05-19 NOTE — Telephone Encounter (Signed)
Did you let patient know
